# Patient Record
Sex: Female | Born: 1960 | Race: Black or African American | Hispanic: No | Marital: Single | Smoking: Never smoker
Health system: Southern US, Community
[De-identification: ages and names within clinical notes are randomized; demographics above are authoritative.]

## PROBLEM LIST (undated history)

## (undated) ENCOUNTER — Emergency Department (HOSPITAL_COMMUNITY): Admission: EM | Payer: Self-pay | Source: Home / Self Care

## (undated) DIAGNOSIS — M545 Low back pain, unspecified: Secondary | ICD-10-CM

## (undated) DIAGNOSIS — R519 Headache, unspecified: Secondary | ICD-10-CM

## (undated) DIAGNOSIS — G8929 Other chronic pain: Secondary | ICD-10-CM

## (undated) DIAGNOSIS — F1020 Alcohol dependence, uncomplicated: Secondary | ICD-10-CM

## (undated) DIAGNOSIS — I10 Essential (primary) hypertension: Secondary | ICD-10-CM

## (undated) DIAGNOSIS — K529 Noninfective gastroenteritis and colitis, unspecified: Secondary | ICD-10-CM

## (undated) DIAGNOSIS — K922 Gastrointestinal hemorrhage, unspecified: Secondary | ICD-10-CM

## (undated) DIAGNOSIS — N12 Tubulo-interstitial nephritis, not specified as acute or chronic: Secondary | ICD-10-CM

## (undated) DIAGNOSIS — R51 Headache: Secondary | ICD-10-CM

## (undated) DIAGNOSIS — F1910 Other psychoactive substance abuse, uncomplicated: Secondary | ICD-10-CM

## (undated) DIAGNOSIS — R569 Unspecified convulsions: Secondary | ICD-10-CM

## (undated) DIAGNOSIS — K859 Acute pancreatitis without necrosis or infection, unspecified: Secondary | ICD-10-CM

## (undated) HISTORY — PX: NO PAST SURGERIES: SHX2092

---

## 1997-08-02 ENCOUNTER — Inpatient Hospital Stay (HOSPITAL_COMMUNITY): Admission: EM | Admit: 1997-08-02 | Discharge: 1997-08-07 | Payer: Self-pay | Admitting: Emergency Medicine

## 1997-08-13 ENCOUNTER — Emergency Department (HOSPITAL_COMMUNITY): Admission: EM | Admit: 1997-08-13 | Discharge: 1997-08-13 | Payer: Self-pay | Admitting: Emergency Medicine

## 1997-08-15 ENCOUNTER — Emergency Department (HOSPITAL_COMMUNITY): Admission: EM | Admit: 1997-08-15 | Discharge: 1997-08-15 | Payer: Self-pay | Admitting: Emergency Medicine

## 1997-08-18 ENCOUNTER — Other Ambulatory Visit: Admission: RE | Admit: 1997-08-18 | Discharge: 1997-08-18 | Payer: Self-pay | Admitting: Family Medicine

## 1997-10-17 ENCOUNTER — Emergency Department (HOSPITAL_COMMUNITY): Admission: EM | Admit: 1997-10-17 | Discharge: 1997-10-17 | Payer: Self-pay | Admitting: Emergency Medicine

## 1997-10-22 ENCOUNTER — Emergency Department (HOSPITAL_COMMUNITY): Admission: EM | Admit: 1997-10-22 | Discharge: 1997-10-22 | Payer: Self-pay | Admitting: Emergency Medicine

## 1997-11-14 ENCOUNTER — Inpatient Hospital Stay (HOSPITAL_COMMUNITY): Admission: AD | Admit: 1997-11-14 | Discharge: 1997-11-14 | Payer: Self-pay | Admitting: *Deleted

## 1997-11-15 ENCOUNTER — Inpatient Hospital Stay (HOSPITAL_COMMUNITY): Admission: AD | Admit: 1997-11-15 | Discharge: 1997-11-15 | Payer: Self-pay | Admitting: *Deleted

## 1997-11-17 ENCOUNTER — Ambulatory Visit (HOSPITAL_COMMUNITY): Admission: RE | Admit: 1997-11-17 | Discharge: 1997-11-17 | Payer: Self-pay | Admitting: *Deleted

## 1997-11-20 ENCOUNTER — Emergency Department (HOSPITAL_COMMUNITY): Admission: EM | Admit: 1997-11-20 | Discharge: 1997-11-20 | Payer: Self-pay | Admitting: Emergency Medicine

## 1997-12-09 ENCOUNTER — Encounter: Payer: Self-pay | Admitting: Emergency Medicine

## 1997-12-09 ENCOUNTER — Emergency Department (HOSPITAL_COMMUNITY): Admission: EM | Admit: 1997-12-09 | Discharge: 1997-12-09 | Payer: Self-pay | Admitting: Emergency Medicine

## 1998-01-07 ENCOUNTER — Inpatient Hospital Stay (HOSPITAL_COMMUNITY): Admission: EM | Admit: 1998-01-07 | Discharge: 1998-01-10 | Payer: Self-pay | Admitting: Emergency Medicine

## 1998-01-07 ENCOUNTER — Encounter: Payer: Self-pay | Admitting: Emergency Medicine

## 1998-01-07 ENCOUNTER — Encounter: Payer: Self-pay | Admitting: Internal Medicine

## 1998-03-26 ENCOUNTER — Emergency Department (HOSPITAL_COMMUNITY): Admission: EM | Admit: 1998-03-26 | Discharge: 1998-03-26 | Payer: Self-pay | Admitting: Emergency Medicine

## 1998-03-27 ENCOUNTER — Encounter: Payer: Self-pay | Admitting: Emergency Medicine

## 1998-05-20 ENCOUNTER — Inpatient Hospital Stay (HOSPITAL_COMMUNITY): Admission: EM | Admit: 1998-05-20 | Discharge: 1998-05-22 | Payer: Self-pay | Admitting: Emergency Medicine

## 1998-05-20 ENCOUNTER — Encounter: Payer: Self-pay | Admitting: Emergency Medicine

## 1998-05-21 ENCOUNTER — Encounter: Payer: Self-pay | Admitting: General Surgery

## 1998-07-08 ENCOUNTER — Encounter: Payer: Self-pay | Admitting: Emergency Medicine

## 1998-07-08 ENCOUNTER — Emergency Department (HOSPITAL_COMMUNITY): Admission: EM | Admit: 1998-07-08 | Discharge: 1998-07-08 | Payer: Self-pay | Admitting: Emergency Medicine

## 1998-08-26 ENCOUNTER — Emergency Department (HOSPITAL_COMMUNITY): Admission: EM | Admit: 1998-08-26 | Discharge: 1998-08-26 | Payer: Self-pay | Admitting: Emergency Medicine

## 1998-10-23 ENCOUNTER — Other Ambulatory Visit: Admission: RE | Admit: 1998-10-23 | Discharge: 1998-10-23 | Payer: Self-pay | Admitting: Family Medicine

## 1998-11-09 ENCOUNTER — Emergency Department (HOSPITAL_COMMUNITY): Admission: EM | Admit: 1998-11-09 | Discharge: 1998-11-09 | Payer: Self-pay | Admitting: Emergency Medicine

## 1999-01-13 ENCOUNTER — Emergency Department (HOSPITAL_COMMUNITY): Admission: EM | Admit: 1999-01-13 | Discharge: 1999-01-13 | Payer: Self-pay | Admitting: Emergency Medicine

## 1999-01-13 ENCOUNTER — Encounter: Payer: Self-pay | Admitting: Emergency Medicine

## 1999-01-14 ENCOUNTER — Encounter: Payer: Self-pay | Admitting: Emergency Medicine

## 1999-05-29 ENCOUNTER — Encounter: Payer: Self-pay | Admitting: Emergency Medicine

## 1999-05-29 ENCOUNTER — Emergency Department (HOSPITAL_COMMUNITY): Admission: EM | Admit: 1999-05-29 | Discharge: 1999-05-29 | Payer: Self-pay | Admitting: Emergency Medicine

## 1999-07-17 ENCOUNTER — Emergency Department (HOSPITAL_COMMUNITY): Admission: EM | Admit: 1999-07-17 | Discharge: 1999-07-17 | Payer: Self-pay | Admitting: Emergency Medicine

## 1999-10-13 ENCOUNTER — Encounter: Payer: Self-pay | Admitting: Emergency Medicine

## 1999-10-13 ENCOUNTER — Emergency Department (HOSPITAL_COMMUNITY): Admission: EM | Admit: 1999-10-13 | Discharge: 1999-10-13 | Payer: Self-pay | Admitting: Emergency Medicine

## 1999-10-30 ENCOUNTER — Encounter: Payer: Self-pay | Admitting: Emergency Medicine

## 1999-10-30 ENCOUNTER — Emergency Department (HOSPITAL_COMMUNITY): Admission: EM | Admit: 1999-10-30 | Discharge: 1999-10-30 | Payer: Self-pay | Admitting: Emergency Medicine

## 1999-11-09 ENCOUNTER — Emergency Department (HOSPITAL_COMMUNITY): Admission: EM | Admit: 1999-11-09 | Discharge: 1999-11-09 | Payer: Self-pay | Admitting: Emergency Medicine

## 1999-11-09 ENCOUNTER — Encounter: Payer: Self-pay | Admitting: Emergency Medicine

## 1999-11-25 ENCOUNTER — Encounter: Payer: Self-pay | Admitting: Emergency Medicine

## 1999-11-25 ENCOUNTER — Emergency Department (HOSPITAL_COMMUNITY): Admission: EM | Admit: 1999-11-25 | Discharge: 1999-11-25 | Payer: Self-pay | Admitting: Emergency Medicine

## 2000-02-14 ENCOUNTER — Emergency Department (HOSPITAL_COMMUNITY): Admission: EM | Admit: 2000-02-14 | Discharge: 2000-02-15 | Payer: Self-pay

## 2000-02-24 ENCOUNTER — Emergency Department (HOSPITAL_COMMUNITY): Admission: EM | Admit: 2000-02-24 | Discharge: 2000-02-24 | Payer: Self-pay | Admitting: Emergency Medicine

## 2000-04-17 ENCOUNTER — Encounter: Payer: Self-pay | Admitting: Family Medicine

## 2000-04-17 ENCOUNTER — Ambulatory Visit (HOSPITAL_COMMUNITY): Admission: RE | Admit: 2000-04-17 | Discharge: 2000-04-17 | Payer: Self-pay | Admitting: Family Medicine

## 2001-04-30 ENCOUNTER — Emergency Department (HOSPITAL_COMMUNITY): Admission: EM | Admit: 2001-04-30 | Discharge: 2001-04-30 | Payer: Self-pay | Admitting: *Deleted

## 2001-09-20 ENCOUNTER — Emergency Department (HOSPITAL_COMMUNITY): Admission: EM | Admit: 2001-09-20 | Discharge: 2001-09-20 | Payer: Self-pay | Admitting: Emergency Medicine

## 2001-09-20 ENCOUNTER — Encounter: Payer: Self-pay | Admitting: Emergency Medicine

## 2001-11-12 ENCOUNTER — Emergency Department (HOSPITAL_COMMUNITY): Admission: EM | Admit: 2001-11-12 | Discharge: 2001-11-12 | Payer: Self-pay | Admitting: Emergency Medicine

## 2001-11-12 ENCOUNTER — Encounter: Payer: Self-pay | Admitting: Emergency Medicine

## 2002-01-25 ENCOUNTER — Encounter: Payer: Self-pay | Admitting: *Deleted

## 2002-01-25 ENCOUNTER — Inpatient Hospital Stay (HOSPITAL_COMMUNITY): Admission: EM | Admit: 2002-01-25 | Discharge: 2002-01-27 | Payer: Self-pay | Admitting: Emergency Medicine

## 2002-04-18 ENCOUNTER — Emergency Department (HOSPITAL_COMMUNITY): Admission: EM | Admit: 2002-04-18 | Discharge: 2002-04-18 | Payer: Self-pay | Admitting: Emergency Medicine

## 2002-04-20 ENCOUNTER — Emergency Department (HOSPITAL_COMMUNITY): Admission: EM | Admit: 2002-04-20 | Discharge: 2002-04-20 | Payer: Self-pay | Admitting: Emergency Medicine

## 2002-04-22 ENCOUNTER — Emergency Department (HOSPITAL_COMMUNITY): Admission: EM | Admit: 2002-04-22 | Discharge: 2002-04-22 | Payer: Self-pay | Admitting: Emergency Medicine

## 2002-04-28 ENCOUNTER — Emergency Department (HOSPITAL_COMMUNITY): Admission: EM | Admit: 2002-04-28 | Discharge: 2002-04-28 | Payer: Self-pay | Admitting: Emergency Medicine

## 2002-06-30 ENCOUNTER — Emergency Department (HOSPITAL_COMMUNITY): Admission: EM | Admit: 2002-06-30 | Discharge: 2002-07-01 | Payer: Self-pay

## 2002-10-01 ENCOUNTER — Emergency Department (HOSPITAL_COMMUNITY): Admission: EM | Admit: 2002-10-01 | Discharge: 2002-10-01 | Payer: Self-pay | Admitting: Emergency Medicine

## 2003-06-06 ENCOUNTER — Emergency Department (HOSPITAL_COMMUNITY): Admission: EM | Admit: 2003-06-06 | Discharge: 2003-06-06 | Payer: Self-pay

## 2003-08-14 ENCOUNTER — Emergency Department (HOSPITAL_COMMUNITY): Admission: EM | Admit: 2003-08-14 | Discharge: 2003-08-14 | Payer: Self-pay | Admitting: Emergency Medicine

## 2004-04-02 ENCOUNTER — Ambulatory Visit: Payer: Self-pay | Admitting: Internal Medicine

## 2004-10-27 ENCOUNTER — Emergency Department (HOSPITAL_COMMUNITY): Admission: EM | Admit: 2004-10-27 | Discharge: 2004-10-27 | Payer: Self-pay | Admitting: *Deleted

## 2005-07-22 ENCOUNTER — Emergency Department (HOSPITAL_COMMUNITY): Admission: EM | Admit: 2005-07-22 | Discharge: 2005-07-22 | Payer: Self-pay | Admitting: Emergency Medicine

## 2005-08-10 ENCOUNTER — Emergency Department (HOSPITAL_COMMUNITY): Admission: EM | Admit: 2005-08-10 | Discharge: 2005-08-10 | Payer: Self-pay | Admitting: Emergency Medicine

## 2005-10-03 ENCOUNTER — Ambulatory Visit: Payer: Self-pay | Admitting: Family Medicine

## 2005-10-11 ENCOUNTER — Ambulatory Visit (HOSPITAL_COMMUNITY): Admission: RE | Admit: 2005-10-11 | Discharge: 2005-10-11 | Payer: Self-pay | Admitting: Family Medicine

## 2005-10-13 ENCOUNTER — Emergency Department (HOSPITAL_COMMUNITY): Admission: EM | Admit: 2005-10-13 | Discharge: 2005-10-13 | Payer: Self-pay | Admitting: Emergency Medicine

## 2006-07-06 ENCOUNTER — Emergency Department (HOSPITAL_COMMUNITY): Admission: EM | Admit: 2006-07-06 | Discharge: 2006-07-07 | Payer: Self-pay | Admitting: Emergency Medicine

## 2006-08-02 ENCOUNTER — Emergency Department (HOSPITAL_COMMUNITY): Admission: EM | Admit: 2006-08-02 | Discharge: 2006-08-02 | Payer: Self-pay | Admitting: Emergency Medicine

## 2006-11-03 ENCOUNTER — Ambulatory Visit: Payer: Self-pay | Admitting: Internal Medicine

## 2006-11-03 LAB — CONVERTED CEMR LAB
Basophils Absolute: 0 10*3/uL (ref 0.0–0.1)
Basophils Relative: 1 % (ref 0–1)
Eosinophils Relative: 1 % (ref 0–5)
Hemoglobin: 13.2 g/dL (ref 12.0–15.0)
MCHC: 34 g/dL (ref 30.0–36.0)
Monocytes Absolute: 0.5 10*3/uL (ref 0.2–0.7)
Neutro Abs: 2.7 10*3/uL (ref 1.7–7.7)
Platelets: 86 10*3/uL — ABNORMAL LOW (ref 150–400)
RDW: 15.2 % — ABNORMAL HIGH (ref 11.5–14.0)

## 2006-11-04 ENCOUNTER — Emergency Department (HOSPITAL_COMMUNITY): Admission: EM | Admit: 2006-11-04 | Discharge: 2006-11-04 | Payer: Self-pay | Admitting: Emergency Medicine

## 2006-12-02 ENCOUNTER — Ambulatory Visit: Payer: Self-pay | Admitting: Internal Medicine

## 2007-02-08 ENCOUNTER — Emergency Department (HOSPITAL_COMMUNITY): Admission: EM | Admit: 2007-02-08 | Discharge: 2007-02-08 | Payer: Self-pay | Admitting: Emergency Medicine

## 2007-05-04 ENCOUNTER — Ambulatory Visit: Payer: Self-pay | Admitting: Internal Medicine

## 2007-06-19 ENCOUNTER — Emergency Department (HOSPITAL_COMMUNITY): Admission: EM | Admit: 2007-06-19 | Discharge: 2007-06-19 | Payer: Self-pay | Admitting: Emergency Medicine

## 2007-08-17 ENCOUNTER — Emergency Department (HOSPITAL_COMMUNITY): Admission: EM | Admit: 2007-08-17 | Discharge: 2007-08-17 | Payer: Self-pay | Admitting: Family Medicine

## 2007-10-13 ENCOUNTER — Encounter: Payer: Self-pay | Admitting: Family Medicine

## 2007-10-13 LAB — CONVERTED CEMR LAB
ALT: 13 units/L (ref 0–35)
Basophils Absolute: 0 10*3/uL (ref 0.0–0.1)
CO2: 22 meq/L (ref 19–32)
Calcium: 9.1 mg/dL (ref 8.4–10.5)
Chloride: 106 meq/L (ref 96–112)
Cholesterol: 143 mg/dL (ref 0–200)
Hemoglobin: 13.3 g/dL (ref 12.0–15.0)
Lymphocytes Relative: 44 % (ref 12–46)
Neutro Abs: 2.9 10*3/uL (ref 1.7–7.7)
Platelets: 249 10*3/uL (ref 150–400)
RDW: 14.6 % (ref 11.5–15.5)
Sodium: 140 meq/L (ref 135–145)
Total Protein: 6.9 g/dL (ref 6.0–8.3)
VLDL: 18 mg/dL (ref 0–40)

## 2007-11-18 ENCOUNTER — Emergency Department (HOSPITAL_COMMUNITY): Admission: EM | Admit: 2007-11-18 | Discharge: 2007-11-18 | Payer: Self-pay | Admitting: Emergency Medicine

## 2008-03-29 ENCOUNTER — Ambulatory Visit: Payer: Self-pay | Admitting: Internal Medicine

## 2008-03-29 ENCOUNTER — Encounter: Payer: Self-pay | Admitting: Internal Medicine

## 2008-03-29 ENCOUNTER — Other Ambulatory Visit: Admission: RE | Admit: 2008-03-29 | Discharge: 2008-03-29 | Payer: Self-pay | Admitting: Internal Medicine

## 2008-03-30 ENCOUNTER — Encounter (INDEPENDENT_AMBULATORY_CARE_PROVIDER_SITE_OTHER): Payer: Self-pay | Admitting: Family Medicine

## 2008-03-30 LAB — CONVERTED CEMR LAB: FSH: 21.4 milliintl units/mL

## 2008-07-09 ENCOUNTER — Emergency Department (HOSPITAL_COMMUNITY): Admission: EM | Admit: 2008-07-09 | Discharge: 2008-07-09 | Payer: Self-pay | Admitting: Emergency Medicine

## 2008-11-14 ENCOUNTER — Ambulatory Visit: Payer: Self-pay | Admitting: Family Medicine

## 2008-11-14 ENCOUNTER — Encounter (INDEPENDENT_AMBULATORY_CARE_PROVIDER_SITE_OTHER): Payer: Self-pay | Admitting: Family Medicine

## 2008-11-14 LAB — CONVERTED CEMR LAB
ALT: 23 units/L (ref 0–35)
AST: 26 units/L (ref 0–37)
Albumin: 4.5 g/dL (ref 3.5–5.2)
BUN: 10 mg/dL (ref 6–23)
Barbiturate Quant, Ur: NEGATIVE
Calcium: 9.2 mg/dL (ref 8.4–10.5)
Chloride: 101 meq/L (ref 96–112)
Creatinine,U: 87.5 mg/dL
Eosinophils Relative: 1 % (ref 0–5)
HCT: 38.8 % (ref 36.0–46.0)
Hemoglobin: 12.9 g/dL (ref 12.0–15.0)
Lymphocytes Relative: 27 % (ref 12–46)
Lymphs Abs: 1.1 10*3/uL (ref 0.7–4.0)
Methadone: NEGATIVE
Opiate Screen, Urine: NEGATIVE
Phencyclidine (PCP): NEGATIVE
Platelets: 161 10*3/uL (ref 150–400)
Potassium: 3.9 meq/L (ref 3.5–5.3)
Propoxyphene: NEGATIVE
Total Protein: 7.3 g/dL (ref 6.0–8.3)
WBC: 4.2 10*3/uL (ref 4.0–10.5)

## 2009-02-04 DIAGNOSIS — F1910 Other psychoactive substance abuse, uncomplicated: Secondary | ICD-10-CM

## 2009-02-04 DIAGNOSIS — F1020 Alcohol dependence, uncomplicated: Secondary | ICD-10-CM

## 2009-02-04 HISTORY — DX: Alcohol dependence, uncomplicated: F10.20

## 2009-02-04 HISTORY — DX: Other psychoactive substance abuse, uncomplicated: F19.10

## 2009-03-30 ENCOUNTER — Emergency Department (HOSPITAL_COMMUNITY): Admission: EM | Admit: 2009-03-30 | Discharge: 2009-03-30 | Payer: Self-pay | Admitting: Emergency Medicine

## 2009-04-27 ENCOUNTER — Emergency Department (HOSPITAL_COMMUNITY): Admission: EM | Admit: 2009-04-27 | Discharge: 2009-04-27 | Payer: Self-pay | Admitting: Emergency Medicine

## 2009-05-31 ENCOUNTER — Encounter (INDEPENDENT_AMBULATORY_CARE_PROVIDER_SITE_OTHER): Payer: Self-pay | Admitting: Adult Health

## 2009-05-31 ENCOUNTER — Ambulatory Visit: Payer: Self-pay | Admitting: Family Medicine

## 2009-05-31 LAB — CONVERTED CEMR LAB
Barbiturate Quant, Ur: NEGATIVE
Creatinine,U: 189 mg/dL
Methadone: NEGATIVE
Propoxyphene: NEGATIVE

## 2009-06-01 ENCOUNTER — Encounter (INDEPENDENT_AMBULATORY_CARE_PROVIDER_SITE_OTHER): Payer: Self-pay | Admitting: Adult Health

## 2009-08-16 ENCOUNTER — Emergency Department (HOSPITAL_COMMUNITY): Admission: EM | Admit: 2009-08-16 | Discharge: 2009-08-16 | Payer: Self-pay | Admitting: Emergency Medicine

## 2009-11-30 ENCOUNTER — Inpatient Hospital Stay (HOSPITAL_COMMUNITY): Admission: EM | Admit: 2009-11-30 | Discharge: 2009-12-01 | Payer: Self-pay | Admitting: Emergency Medicine

## 2010-02-25 ENCOUNTER — Encounter: Payer: Self-pay | Admitting: Internal Medicine

## 2010-03-28 ENCOUNTER — Emergency Department (HOSPITAL_COMMUNITY)
Admission: EM | Admit: 2010-03-28 | Discharge: 2010-03-28 | Disposition: A | Discharge status: Home / Self Care | Payer: Medicaid Other | Attending: Emergency Medicine | Admitting: Emergency Medicine

## 2010-03-28 DIAGNOSIS — M545 Low back pain, unspecified: Secondary | ICD-10-CM | POA: Insufficient documentation

## 2010-03-28 DIAGNOSIS — M25519 Pain in unspecified shoulder: Secondary | ICD-10-CM | POA: Insufficient documentation

## 2010-03-28 DIAGNOSIS — G40909 Epilepsy, unspecified, not intractable, without status epilepticus: Secondary | ICD-10-CM | POA: Insufficient documentation

## 2010-04-18 LAB — APTT: aPTT: 29 seconds (ref 24–37)

## 2010-04-18 LAB — GLUCOSE, CAPILLARY
Glucose-Capillary: 119 mg/dL — ABNORMAL HIGH (ref 70–99)
Glucose-Capillary: 72 mg/dL (ref 70–99)
Glucose-Capillary: 76 mg/dL (ref 70–99)
Glucose-Capillary: 91 mg/dL (ref 70–99)

## 2010-04-18 LAB — BASIC METABOLIC PANEL
BUN: 10 mg/dL (ref 6–23)
CO2: 21 mEq/L (ref 19–32)
CO2: 28 mEq/L (ref 19–32)
Calcium: 8 mg/dL — ABNORMAL LOW (ref 8.4–10.5)
Chloride: 104 mEq/L (ref 96–112)
Chloride: 113 mEq/L — ABNORMAL HIGH (ref 96–112)
Creatinine, Ser: 0.61 mg/dL (ref 0.4–1.2)
Creatinine, Ser: 0.71 mg/dL (ref 0.4–1.2)
GFR calc Af Amer: >60 mL/min (ref >60)

## 2010-04-18 LAB — TSH: TSH: 0.717 u[IU]/mL (ref 0.350–4.500)

## 2010-04-18 LAB — URINALYSIS, ROUTINE W REFLEX MICROSCOPIC
Glucose, UA: NEGATIVE mg/dL
Nitrite: NEGATIVE
Protein, ur: NEGATIVE mg/dL
pH: 5.5 (ref 5.0–8.0)

## 2010-04-18 LAB — CBC
Hemoglobin: 11.9 g/dL — ABNORMAL LOW (ref 12.0–15.0)
MCH: 32.5 pg (ref 26.0–34.0)
MCH: 32.5 pg (ref 26.0–34.0)
MCH: 33.2 pg (ref 26.0–34.0)
MCHC: 34.3 g/dL (ref 30.0–36.0)
MCHC: 35.1 g/dL (ref 30.0–36.0)
MCV: 94.6 fL (ref 78.0–100.0)
MCV: 94.8 fL (ref 78.0–100.0)
MCV: 95 fL (ref 78.0–100.0)
Platelets: 177 10*3/uL (ref 150–400)
Platelets: 185 10*3/uL (ref 150–400)
RBC: 2.93 MIL/uL — ABNORMAL LOW (ref 3.87–5.11)
RDW: 13.9 % (ref 11.5–15.5)

## 2010-04-18 LAB — POCT I-STAT, CHEM 8
Calcium, Ion: 1.04 mmol/L — ABNORMAL LOW (ref 1.12–1.32)
Creatinine, Ser: 1.1 mg/dL (ref 0.4–1.2)
Glucose, Bld: 88 mg/dL (ref 70–99)
HCT: 41 % (ref 36.0–46.0)
Hemoglobin: 13.9 g/dL (ref 12.0–15.0)
Potassium: 3.9 mEq/L (ref 3.5–5.1)
TCO2: 27 mmol/L (ref 0–100)

## 2010-04-18 LAB — TYPE AND SCREEN
ABO/RH(D): B POS
Antibody Screen: NEGATIVE

## 2010-04-18 LAB — COMPREHENSIVE METABOLIC PANEL
ALT: 13 U/L (ref 0–35)
CO2: 25 mEq/L (ref 19–32)
Calcium: 8.7 mg/dL (ref 8.4–10.5)
Creatinine, Ser: 0.64 mg/dL (ref 0.4–1.2)
GFR calc non Af Amer: >60 mL/min (ref >60)
Glucose, Bld: 91 mg/dL (ref 70–99)
Sodium: 137 mEq/L (ref 135–145)
Total Protein: 7.7 g/dL (ref 6.0–8.3)

## 2010-04-18 LAB — URINE MICROSCOPIC-ADD ON

## 2010-04-18 LAB — DIFFERENTIAL
Basophils Absolute: 0.1 10*3/uL (ref 0.0–0.1)
Eosinophils Relative: 1 % (ref 0–5)
Lymphocytes Relative: 52 % — ABNORMAL HIGH (ref 12–46)
Lymphs Abs: 2.5 10*3/uL (ref 0.7–4.0)
Neutro Abs: 2 10*3/uL (ref 1.7–7.7)
Neutrophils Relative %: 41 % — ABNORMAL LOW (ref 43–77)

## 2010-04-18 LAB — HEPATIC FUNCTION PANEL
Bilirubin, Direct: 0.1 mg/dL (ref 0.0–0.3)
Indirect Bilirubin: 0.4 mg/dL (ref 0.3–0.9)
Total Bilirubin: 0.5 mg/dL (ref 0.3–1.2)

## 2010-04-18 LAB — URINE CULTURE: Colony Count: >=100000

## 2010-04-18 LAB — LIPID PANEL
Cholesterol: 196 mg/dL (ref 0–200)
Total CHOL/HDL Ratio: 2.6 RATIO

## 2010-04-18 LAB — PROTIME-INR
INR: 0.91 (ref 0.00–1.49)
Prothrombin Time: 12.5 seconds (ref 11.6–15.2)

## 2010-04-18 LAB — PREGNANCY, URINE: Preg Test, Ur: NEGATIVE

## 2010-04-18 LAB — RAPID URINE DRUG SCREEN, HOSP PERFORMED: Opiates: NOT DETECTED

## 2010-04-22 LAB — URINE CULTURE

## 2010-04-22 LAB — DIFFERENTIAL
Basophils Absolute: 0 10*3/uL (ref 0.0–0.1)
Lymphocytes Relative: 8 % — ABNORMAL LOW (ref 12–46)
Neutro Abs: 6.8 10*3/uL (ref 1.7–7.7)
Neutrophils Relative %: 82 % — ABNORMAL HIGH (ref 43–77)

## 2010-04-22 LAB — RAPID URINE DRUG SCREEN, HOSP PERFORMED
Amphetamines: NOT DETECTED
Barbiturates: NOT DETECTED
Benzodiazepines: NOT DETECTED

## 2010-04-22 LAB — URINALYSIS, ROUTINE W REFLEX MICROSCOPIC
Ketones, ur: NEGATIVE mg/dL
Nitrite: POSITIVE — AB
Specific Gravity, Urine: 1.016 (ref 1.005–1.030)
Urobilinogen, UA: 2 mg/dL — ABNORMAL HIGH (ref 0.0–1.0)
pH: 6.5 (ref 5.0–8.0)

## 2010-04-22 LAB — PHENYTOIN LEVEL, TOTAL: Phenytoin Lvl: <2.5 ug/mL — ABNORMAL LOW (ref 10.0–20.0)

## 2010-04-22 LAB — URINE MICROSCOPIC-ADD ON

## 2010-04-22 LAB — CBC
Platelets: 148 10*3/uL — ABNORMAL LOW (ref 150–400)
RDW: 14.4 % (ref 11.5–15.5)
WBC: 8.3 10*3/uL (ref 4.0–10.5)

## 2010-04-22 LAB — POCT I-STAT, CHEM 8
Chloride: 99 mEq/L (ref 96–112)
HCT: 44 % (ref 36.0–46.0)
Potassium: 3.5 mEq/L (ref 3.5–5.1)

## 2010-04-22 LAB — ETHANOL: Alcohol, Ethyl (B): <5 mg/dL (ref 0–10)

## 2010-04-27 LAB — POCT I-STAT, CHEM 8
BUN: 3 mg/dL — ABNORMAL LOW (ref 6–23)
HCT: 37 % (ref 36.0–46.0)
Hemoglobin: 12.6 g/dL (ref 12.0–15.0)
Sodium: 144 mEq/L (ref 135–145)
TCO2: 26 mmol/L (ref 0–100)

## 2010-04-27 LAB — ETHANOL: Alcohol, Ethyl (B): 67 mg/dL — ABNORMAL HIGH (ref 0–10)

## 2010-05-14 LAB — COMPREHENSIVE METABOLIC PANEL
ALT: 21 U/L (ref 0–35)
AST: 41 U/L — ABNORMAL HIGH (ref 0–37)
Albumin: 4 g/dL (ref 3.5–5.2)
CO2: 19 mEq/L (ref 19–32)
Calcium: 8.4 mg/dL (ref 8.4–10.5)
Chloride: 106 mEq/L (ref 96–112)
GFR calc Af Amer: >60 mL/min (ref >60)
GFR calc non Af Amer: >60 mL/min (ref >60)
Sodium: 138 mEq/L (ref 135–145)

## 2010-05-14 LAB — URINALYSIS, ROUTINE W REFLEX MICROSCOPIC
Leukocytes, UA: NEGATIVE
Nitrite: NEGATIVE
Protein, ur: NEGATIVE mg/dL
Urobilinogen, UA: 0.2 mg/dL (ref 0.0–1.0)

## 2010-05-14 LAB — CBC
MCHC: 34.8 g/dL (ref 30.0–36.0)
Platelets: 149 10*3/uL — ABNORMAL LOW (ref 150–400)
RBC: 3.78 MIL/uL — ABNORMAL LOW (ref 3.87–5.11)
WBC: 6.3 10*3/uL (ref 4.0–10.5)

## 2010-05-14 LAB — DIFFERENTIAL
Eosinophils Absolute: 0.1 10*3/uL (ref 0.0–0.7)
Eosinophils Relative: 1 % (ref 0–5)
Lymphocytes Relative: 22 % (ref 12–46)
Lymphs Abs: 1.4 10*3/uL (ref 0.7–4.0)
Monocytes Absolute: 0.4 10*3/uL (ref 0.1–1.0)

## 2010-05-14 LAB — RAPID URINE DRUG SCREEN, HOSP PERFORMED
Amphetamines: NOT DETECTED
Benzodiazepines: NOT DETECTED
Tetrahydrocannabinol: NOT DETECTED

## 2010-05-14 LAB — PHENYTOIN LEVEL, TOTAL: Phenytoin Lvl: <2.5 ug/mL — ABNORMAL LOW (ref 10.0–20.0)

## 2010-05-14 LAB — URINE MICROSCOPIC-ADD ON

## 2010-06-22 NOTE — Discharge Summary (Signed)
   NAMESACHA, TOPOR                        ACCOUNT NO.:  [id number]   MEDICAL RECORD NO.:  [id number]                   PATIENT TYPE:  INP   LOCATION:  5006                                 FACILITY:  [facility]   PHYSICIAN:  Merlene Laughter. Renae Gloss, M.D.           DATE OF BIRTH:  05-Jun-1960   DATE OF ADMISSION:  01/25/2002  DATE OF DISCHARGE:  01/27/2002                                 DISCHARGE SUMMARY   DISCHARGE DIAGNOSES:  1. Pneumonia.  2. Polysubstance abuse.   DISCHARGE MEDICATIONS:  1. Tessalon Perles 200 mg p.o. t.i.d.  2. Ventolin inhaler two puffs q.i.d.  3. Tequin 400 mg one p.o. q.d. for 7 days following discharge.   HOSPITAL COURSE:  The patient was admitted with fever, white cell count of  25 and a right lower lobe infiltrate.  These symptoms were suggestive of  pneumonia.  She was admitted for IV antibiotic treatment and observation.  She did well with IV medications.  She had no signs of withdrawal from  polysubstance abuse during this admission.   DISPOSITION FOLLOWING DISCHARGE:  The patient was at her baseline status,  white cell count was 10.8.  She was tolerating oral intake and ambulating  without complications.   FOLLOW UP:  The patient will arrange a followup appointment with [facility]  within 2 weeks following discharge.                                               Merlene Laughter. Renae Gloss, M.D.    KRS/MEDQ  D:  01/27/2002  T:  01/28/2002  Job:  [id number]

## 2010-06-22 NOTE — H&P (Signed)
   NAMEVADIE, Brandy Bishop                        ACCOUNT NO.:  [id number]   MEDICAL RECORD NO.:  [id number]                   PATIENT TYPE:  INP   LOCATION:  5006                                 FACILITY:  [facility]   PHYSICIAN:  Merlene Laughter. Renae Gloss, M.D.           DATE OF BIRTH:  1960/03/31   DATE OF ADMISSION:  01/25/2002  DATE OF DISCHARGE:                                HISTORY & PHYSICAL   CHIEF COMPLAINT:  Cough, shortness of breath.   HISTORY OF PRESENT ILLNESS:  The patient is a 50 year old lady who presents  today to the emergency room with a several day history of progressively  worsening coughing with shortness of breath. A chest x-ray revealed a right  lower lobe infiltrate. She has no other acute constitutional or systemic  complaints other than productive cough with green sputum, intermittent fever  and chills.   ALLERGIES:  PENICILLIN CAUSES SWELLING.   PAST MEDICAL HISTORY:  1. 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  2. Significant for alcohol and cocaine abuse.   FAMILY HISTORY:  Negative for hypertension, diabetes or cancer known to the  patient.   REVIEW OF SYSTEMS:  As per HPI and the patient's history. Greater than 10  systems are reviewed.   PHYSICAL EXAMINATION:  GENERAL:  A well developed, well nourished black  female in no acute distress.  VITAL SIGNS:  Blood pressure 137/86, temperature 100.6, pulse 86,  respirations 20, O2 saturations on room air 98%.  HEENT:  Tympanic membranes within normal limits. No oropharyngeal lesions.  NECK:  Supple, no masses, 2+ carotids, no bruits.  LUNGS:  Decreased breath sounds at the bases bilaterally.  HEART:  S1, S2, regular rate and rhythm, no murmurs, rubs, gallops.  ABDOMEN:  Soft, nontender, nondistended, positive bowel sounds.  EXTREMITIES:  No cyanosis, clubbing or edema.  NEUROLOGIC:  Alert and oriented x 3. Cranial nerves intact.   ASSESSMENT AND PLAN:  Pneumonia with shortness of breath. Albuterol  nebulizers will be started as well as IV antibiotics. She  will be observed  for 24 hours and discharged on oral therapy.                                                 Merlene Laughter. Renae Gloss, M.D.    KRS/MEDQ  D:  01/25/2002  T:  01/25/2002  Job:  [id number]

## 2010-08-19 ENCOUNTER — Emergency Department (HOSPITAL_COMMUNITY): Payer: Medicaid Other

## 2010-08-19 ENCOUNTER — Emergency Department (HOSPITAL_COMMUNITY)
Admission: EM | Admit: 2010-08-19 | Discharge: 2010-08-20 | Disposition: A | Discharge status: Home / Self Care | Payer: Medicaid Other | Attending: Emergency Medicine | Admitting: Emergency Medicine

## 2010-08-19 DIAGNOSIS — K2970 Gastritis, unspecified, without bleeding: Secondary | ICD-10-CM | POA: Insufficient documentation

## 2010-08-19 DIAGNOSIS — E876 Hypokalemia: Secondary | ICD-10-CM | POA: Insufficient documentation

## 2010-08-19 DIAGNOSIS — R4182 Altered mental status, unspecified: Secondary | ICD-10-CM | POA: Insufficient documentation

## 2010-08-19 DIAGNOSIS — R569 Unspecified convulsions: Secondary | ICD-10-CM | POA: Insufficient documentation

## 2010-08-19 DIAGNOSIS — I10 Essential (primary) hypertension: Secondary | ICD-10-CM | POA: Insufficient documentation

## 2010-08-19 DIAGNOSIS — F1010 Alcohol abuse, uncomplicated: Secondary | ICD-10-CM | POA: Insufficient documentation

## 2010-08-19 LAB — COMPREHENSIVE METABOLIC PANEL
Albumin: 3.7 g/dL (ref 3.5–5.2)
Alkaline Phosphatase: 73 U/L (ref 39–117)
BUN: 8 mg/dL (ref 6–23)
Chloride: 102 mEq/L (ref 96–112)
Creatinine, Ser: 0.47 mg/dL — ABNORMAL LOW (ref 0.50–1.10)
GFR calc Af Amer: >60 mL/min (ref >60)
GFR calc non Af Amer: >60 mL/min (ref >60)
Glucose, Bld: 137 mg/dL — ABNORMAL HIGH (ref 70–99)
Total Bilirubin: 0.2 mg/dL — ABNORMAL LOW (ref 0.3–1.2)

## 2010-08-19 LAB — URINALYSIS, ROUTINE W REFLEX MICROSCOPIC
Bilirubin Urine: NEGATIVE
Glucose, UA: NEGATIVE mg/dL
Ketones, ur: NEGATIVE mg/dL
Nitrite: NEGATIVE
Specific Gravity, Urine: 1.009 (ref 1.005–1.030)
pH: 5 (ref 5.0–8.0)

## 2010-08-19 LAB — RAPID URINE DRUG SCREEN, HOSP PERFORMED
Barbiturates: NOT DETECTED
Benzodiazepines: NOT DETECTED

## 2010-08-19 LAB — CBC
HCT: 31 % — ABNORMAL LOW (ref 36.0–46.0)
Hemoglobin: 10.6 g/dL — ABNORMAL LOW (ref 12.0–15.0)
MCH: 33.1 pg (ref 26.0–34.0)
MCV: 96.9 fL (ref 78.0–100.0)
Platelets: 121 10*3/uL — ABNORMAL LOW (ref 150–400)
RBC: 3.2 MIL/uL — ABNORMAL LOW (ref 3.87–5.11)
WBC: 3.9 10*3/uL — ABNORMAL LOW (ref 4.0–10.5)

## 2010-08-19 LAB — URINE MICROSCOPIC-ADD ON

## 2010-08-19 LAB — AMMONIA: Ammonia: 65 umol/L — ABNORMAL HIGH (ref 11–60)

## 2010-08-19 LAB — DIFFERENTIAL
Eosinophils Absolute: 0.1 10*3/uL (ref 0.0–0.7)
Lymphocytes Relative: 48 % — ABNORMAL HIGH (ref 12–46)
Lymphs Abs: 1.9 10*3/uL (ref 0.7–4.0)
Monocytes Relative: 9 % (ref 3–12)
Neutro Abs: 1.5 10*3/uL — ABNORMAL LOW (ref 1.7–7.7)
Neutrophils Relative %: 39 % — ABNORMAL LOW (ref 43–77)

## 2010-08-19 LAB — SALICYLATE LEVEL: Salicylate Lvl: <2 mg/dL — ABNORMAL LOW (ref 2.8–20.0)

## 2010-08-19 LAB — POCT PREGNANCY, URINE: Preg Test, Ur: NEGATIVE

## 2010-08-19 LAB — ETHANOL: Alcohol, Ethyl (B): 357 mg/dL — ABNORMAL HIGH (ref 0–11)

## 2010-08-19 LAB — ACETAMINOPHEN LEVEL: Acetaminophen (Tylenol), Serum: <15 ug/mL (ref 10–30)

## 2010-10-24 LAB — DIFFERENTIAL
Eosinophils Absolute: 0.1
Eosinophils Relative: 2
Lymphocytes Relative: 34
Lymphs Abs: 1.4
Monocytes Relative: 7

## 2010-10-24 LAB — BASIC METABOLIC PANEL
Chloride: 98
GFR calc Af Amer: >60
GFR calc non Af Amer: >60
Potassium: 4.1
Sodium: 134 — ABNORMAL LOW

## 2010-10-24 LAB — CBC
HCT: 35.8 — ABNORMAL LOW
Hemoglobin: 12.1
MCV: 93.6
Platelets: 161
RBC: 3.83 — ABNORMAL LOW
WBC: 4.1

## 2010-10-24 LAB — POCT CARDIAC MARKERS
Operator id: 257131
Troponin i, poc: <0.05

## 2010-11-01 LAB — GC/CHLAMYDIA PROBE AMP, GENITAL
Chlamydia, DNA Probe: NEGATIVE
GC Probe Amp, Genital: NEGATIVE

## 2010-11-01 LAB — WET PREP, GENITAL
Clue Cells Wet Prep HPF POC: NONE SEEN
Trich, Wet Prep: NONE SEEN

## 2010-11-05 LAB — LIPASE, BLOOD: Lipase: 13

## 2010-11-05 LAB — URINALYSIS, ROUTINE W REFLEX MICROSCOPIC
Bilirubin Urine: NEGATIVE
Ketones, ur: NEGATIVE
Nitrite: NEGATIVE
Protein, ur: NEGATIVE
Urobilinogen, UA: 0.2
pH: 5.5

## 2010-11-05 LAB — DIFFERENTIAL
Lymphocytes Relative: 31
Lymphs Abs: 1.2
Monocytes Relative: 7
Neutro Abs: 2.3
Neutrophils Relative %: 59

## 2010-11-05 LAB — POCT CARDIAC MARKERS
CKMB, poc: <1 — ABNORMAL LOW
Myoglobin, poc: 28.2
Troponin i, poc: <0.05

## 2010-11-05 LAB — COMPREHENSIVE METABOLIC PANEL
CO2: 26
Calcium: 9.8
Creatinine, Ser: 0.64
GFR calc non Af Amer: >60
Glucose, Bld: 89
Total Protein: 7.6

## 2010-11-05 LAB — CBC
Hemoglobin: 13.2
MCHC: 33.6
MCV: 97.4
RBC: 4.04
RDW: 14.5

## 2010-11-05 LAB — URINE CULTURE: Colony Count: >=100000

## 2010-11-05 LAB — URINE MICROSCOPIC-ADD ON

## 2010-11-05 LAB — PHENYTOIN LEVEL, TOTAL: Phenytoin Lvl: <2.5 — ABNORMAL LOW

## 2010-11-15 LAB — CBC
HCT: 32 — ABNORMAL LOW
Platelets: 83 — ABNORMAL LOW
RDW: 14.8 — ABNORMAL HIGH

## 2011-03-15 ENCOUNTER — Encounter (HOSPITAL_COMMUNITY): Payer: Self-pay | Admitting: *Deleted

## 2011-03-15 ENCOUNTER — Emergency Department (HOSPITAL_COMMUNITY)
Admission: EM | Admit: 2011-03-15 | Discharge: 2011-03-15 | Disposition: A | Discharge status: Home / Self Care | Payer: Medicaid Other | Attending: Emergency Medicine | Admitting: Emergency Medicine

## 2011-03-15 ENCOUNTER — Emergency Department (HOSPITAL_COMMUNITY): Payer: Medicaid Other

## 2011-03-15 DIAGNOSIS — M25519 Pain in unspecified shoulder: Secondary | ICD-10-CM | POA: Insufficient documentation

## 2011-03-15 DIAGNOSIS — IMO0001 Reserved for inherently not codable concepts without codable children: Secondary | ICD-10-CM | POA: Insufficient documentation

## 2011-03-15 DIAGNOSIS — M25511 Pain in right shoulder: Secondary | ICD-10-CM

## 2011-03-15 DIAGNOSIS — J40 Bronchitis, not specified as acute or chronic: Secondary | ICD-10-CM | POA: Insufficient documentation

## 2011-03-15 DIAGNOSIS — G40909 Epilepsy, unspecified, not intractable, without status epilepticus: Secondary | ICD-10-CM | POA: Insufficient documentation

## 2011-03-15 DIAGNOSIS — R05 Cough: Secondary | ICD-10-CM | POA: Insufficient documentation

## 2011-03-15 DIAGNOSIS — R059 Cough, unspecified: Secondary | ICD-10-CM | POA: Insufficient documentation

## 2011-03-15 DIAGNOSIS — R079 Chest pain, unspecified: Secondary | ICD-10-CM | POA: Insufficient documentation

## 2011-03-15 DIAGNOSIS — R0602 Shortness of breath: Secondary | ICD-10-CM | POA: Insufficient documentation

## 2011-03-15 DIAGNOSIS — Z79899 Other long term (current) drug therapy: Secondary | ICD-10-CM | POA: Insufficient documentation

## 2011-03-15 DIAGNOSIS — J45909 Unspecified asthma, uncomplicated: Secondary | ICD-10-CM | POA: Insufficient documentation

## 2011-03-15 HISTORY — DX: Unspecified convulsions: R56.9

## 2011-03-15 MED ORDER — AZITHROMYCIN 250 MG PO TABS: 500.0000 mg | ORAL_TABLET | Freq: Once | ORAL | Status: AC

## 2011-03-15 MED ORDER — IBUPROFEN 800 MG PO TABS
800.0000 mg | ORAL_TABLET | Freq: Once | ORAL | Status: AC
  Administered 2011-03-15: 800 mg via ORAL
  Filled 2011-03-15: qty 1

## 2011-03-15 MED ORDER — IBUPROFEN 800 MG PO TABS: 800.0000 mg | ORAL_TABLET | Freq: Once | ORAL | Status: AC

## 2011-03-15 MED ORDER — ALBUTEROL SULFATE HFA 108 (90 BASE) MCG/ACT IN AERS
2.0000 | INHALATION_SPRAY | Freq: Once | RESPIRATORY_TRACT | Status: AC
  Administered 2011-03-15: 2 via RESPIRATORY_TRACT
  Filled 2011-03-15: qty 6.7

## 2011-03-15 NOTE — ED Notes (Signed)
Pt in c/o cough for unknown amount of time, ETOH, pt state she thinks she has the flu

## 2011-03-15 NOTE — ED Provider Notes (Signed)
Medical screening examination/treatment/procedure(s) were performed by non-physician practitioner and as supervising physician I was immediately available for consultation/collaboration.   Forbes Cellar, MD 03/15/11 [phone]
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
## 2011-11-13 ENCOUNTER — Emergency Department (HOSPITAL_COMMUNITY): Payer: Medicaid Other

## 2011-11-13 ENCOUNTER — Encounter (HOSPITAL_COMMUNITY): Payer: Self-pay | Admitting: *Deleted

## 2011-11-13 ENCOUNTER — Emergency Department (HOSPITAL_COMMUNITY)
Admission: EM | Admit: 2011-11-13 | Discharge: 2011-11-13 | Disposition: A | Discharge status: Home / Self Care | Payer: Medicaid Other | Attending: Emergency Medicine | Admitting: Emergency Medicine

## 2011-11-13 DIAGNOSIS — R070 Pain in throat: Secondary | ICD-10-CM | POA: Insufficient documentation

## 2011-11-13 DIAGNOSIS — R51 Headache: Secondary | ICD-10-CM | POA: Insufficient documentation

## 2011-11-13 DIAGNOSIS — I10 Essential (primary) hypertension: Secondary | ICD-10-CM | POA: Insufficient documentation

## 2011-11-13 DIAGNOSIS — J069 Acute upper respiratory infection, unspecified: Secondary | ICD-10-CM | POA: Insufficient documentation

## 2011-11-13 DIAGNOSIS — M545 Low back pain, unspecified: Secondary | ICD-10-CM | POA: Insufficient documentation

## 2011-11-13 DIAGNOSIS — R220 Localized swelling, mass and lump, head: Secondary | ICD-10-CM | POA: Insufficient documentation

## 2011-11-13 DIAGNOSIS — R079 Chest pain, unspecified: Secondary | ICD-10-CM | POA: Insufficient documentation

## 2011-11-13 DIAGNOSIS — G8929 Other chronic pain: Secondary | ICD-10-CM | POA: Insufficient documentation

## 2011-11-13 DIAGNOSIS — R0982 Postnasal drip: Secondary | ICD-10-CM | POA: Insufficient documentation

## 2011-11-13 DIAGNOSIS — J45909 Unspecified asthma, uncomplicated: Secondary | ICD-10-CM | POA: Insufficient documentation

## 2011-11-13 DIAGNOSIS — Z79899 Other long term (current) drug therapy: Secondary | ICD-10-CM | POA: Insufficient documentation

## 2011-11-13 DIAGNOSIS — R221 Localized swelling, mass and lump, neck: Secondary | ICD-10-CM | POA: Insufficient documentation

## 2011-11-13 DIAGNOSIS — R0602 Shortness of breath: Secondary | ICD-10-CM | POA: Insufficient documentation

## 2011-11-13 HISTORY — DX: Essential (primary) hypertension: I10

## 2011-11-13 LAB — CBC WITH DIFFERENTIAL/PLATELET
Basophils Absolute: 0 10*3/uL (ref 0.0–0.1)
HCT: 33.5 % — ABNORMAL LOW (ref 36.0–46.0)
Lymphocytes Relative: 48 % — ABNORMAL HIGH (ref 12–46)
Lymphs Abs: 2.9 10*3/uL (ref 0.7–4.0)
Neutro Abs: 2.6 10*3/uL (ref 1.7–7.7)
Platelets: 173 10*3/uL (ref 150–400)
RBC: 3.53 MIL/uL — ABNORMAL LOW (ref 3.87–5.11)
RDW: 13.4 % (ref 11.5–15.5)
WBC: 6.1 10*3/uL (ref 4.0–10.5)

## 2011-11-13 LAB — COMPREHENSIVE METABOLIC PANEL
ALT: 10 U/L (ref 0–35)
AST: 16 U/L (ref 0–37)
Alkaline Phosphatase: 79 U/L (ref 39–117)
CO2: 25 mEq/L (ref 19–32)
Chloride: 102 mEq/L (ref 96–112)
GFR calc non Af Amer: >90 mL/min (ref >90)
Glucose, Bld: 85 mg/dL (ref 70–99)
Sodium: 138 mEq/L (ref 135–145)
Total Bilirubin: 0.2 mg/dL — ABNORMAL LOW (ref 0.3–1.2)

## 2011-11-13 LAB — TROPONIN I: Troponin I: <0.3 ng/mL (ref <0.30)

## 2011-11-13 MED ORDER — IPRATROPIUM BROMIDE 0.03 % NA SOLN: 2.0000 | Freq: Two times a day (BID) | NASAL | Status: DC

## 2011-11-13 MED ORDER — ASPIRIN 325 MG PO TABS
325.0000 mg | ORAL_TABLET | Freq: Once | ORAL | Status: AC
  Administered 2011-11-13: 325 mg via ORAL
  Filled 2011-11-13: qty 1

## 2011-11-13 MED ORDER — IBUPROFEN 800 MG PO TABS
800.0000 mg | ORAL_TABLET | Freq: Once | ORAL | Status: AC
  Administered 2011-11-13: 800 mg via ORAL
  Filled 2011-11-13: qty 1

## 2011-11-13 MED ORDER — BENZONATATE 100 MG PO CAPS: 100.0000 mg | ORAL_CAPSULE | Freq: Three times a day (TID) | ORAL | Status: DC | PRN

## 2011-11-13 NOTE — ED Provider Notes (Signed)
Medical screening examination/treatment/procedure(s) were performed by non-physician practitioner and as supervising physician I was immediately available for consultation/collaboration.   Tennie Grussing H Bernadette Gores, MD 11/13/11 0623 

## 2011-11-13 NOTE — ED Notes (Signed)
Pt c/o chest pain rating 7/10; headache; sore throat; toothache x 2 days; lower back pain x 2 days;

## 2011-11-13 NOTE — ED Provider Notes (Signed)
History     CSN: [id number]  Arrival date & time 11/13/11  0150   First MD Initiated Contact with Patient 11/13/11 0310      Chief Complaint  Patient presents with  . Chest Pain  . Back Pain    (Consider location/radiation/quality/duration/timing/severity/associated sxs/prior treatment) The history is provided by the patient and medical records.    1 - back pain: lower back pain, gradually started, persistent gradually worsening in the low back.  Described as sharp, rated at a 10/10 and non-radiating.  Pt denies saddle anesthesia, bowel.bladder dysfunction, or loss of leg function. Nothing makes it better and nothing makes it worse.  Patient states the pain is chronic. She denies any new injury, trauma.  2 - chest pain: began this evening.  Gradual onset, persistent and stabilized.  Described as throbbing, rated at a 10/10, non radiating.  Pt without Hx of MI, DVT, PE.  Pt with Hx of asthma.  She has associated SOB, coughing.  Denies N/V/D, diaphoresis, weakness, syncope.  Coughing makes her pain worse, nothing makes it better.  Alkaseltzer and ASA without relief.  3 - Sore throat: Pt with 2 days of sore throat, gradual onset, persistent and gradually worsening.  Described and sore, rated at a 10/10.  Pt with associated headache, sinus congestion, post nasal drip, rhinorrhea.  Pt denies fever, chills, neck pain, neck stiffness, abdominal pain.  Swallowing and talking make it worse, nothing makes it better.  Past Medical History  Diagnosis Date  . Seizures   . Asthma   . Hypertension     History reviewed. No pertinent past surgical history.  No family history on file.  History  Substance Use Topics  . Smoking status: Never Smoker   . Smokeless tobacco: Not on file  . Alcohol Use: Yes    OB History    Grav Para Term Preterm Abortions TAB SAB Ect Mult Living                  Review of Systems  Constitutional: Negative for fever, diaphoresis, appetite change, fatigue and  unexpected weight change.  HENT: Positive for congestion, sore throat, rhinorrhea, postnasal drip and sinus pressure. Negative for mouth sores, neck pain and neck stiffness.   Eyes: Negative for visual disturbance.  Respiratory: Positive for cough, chest tightness, shortness of breath and wheezing.   Cardiovascular: Positive for chest pain.  Gastrointestinal: Negative for nausea, vomiting, abdominal pain, diarrhea and constipation.  Genitourinary: Negative for dysuria, urgency, frequency and hematuria.  Musculoskeletal: Positive for back pain. Negative for joint swelling and gait problem.  Skin: Negative for rash.  Neurological: Positive for headaches. Negative for syncope and light-headedness.  Psychiatric/Behavioral: Negative for disturbed wake/sleep cycle. The patient is not nervous/anxious.   All other systems reviewed and are negative.    Allergies  Penicillins  Home Medications   Current Outpatient Rx  Name Route Sig Dispense Refill  . ALBUTEROL SULFATE HFA 108 (90 BASE) MCG/ACT IN AERS Inhalation Inhale 2 puffs into the lungs every 6 (six) hours as needed. For breathing    . IBUPROFEN 200 MG PO TABS Oral Take 600 mg by mouth every 6 (six) hours as needed. Pain    . PHENYTOIN SODIUM EXTENDED 100 MG PO CAPS Oral Take 300 mg by mouth every evening.     Marland Kitchen BENZONATATE 100 MG PO CAPS Oral Take 1 capsule (100 mg total) by mouth 3 (three) times daily as needed for cough. 20 capsule 0  . IPRATROPIUM BROMIDE 0.03 %  NA SOLN Nasal Place 2 sprays into the nose 2 (two) times daily. PRN congestion 30 mL 0    BP 123/84  Pulse 74  Temp 98.1 F (36.7 C) (Oral)  Resp 17  SpO2 97%  Physical Exam  Nursing note and vitals reviewed. Constitutional: She is oriented to person, place, and time. She appears well-developed and well-nourished. No distress.  HENT:  Head: Normocephalic and atraumatic.  Right Ear: Tympanic membrane, external ear and ear canal normal.  Left Ear: Tympanic membrane,  external ear and ear canal normal.  Nose: Mucosal edema present. Right sinus exhibits no maxillary sinus tenderness and no frontal sinus tenderness. Left sinus exhibits no maxillary sinus tenderness and no frontal sinus tenderness.  Mouth/Throat: Uvula is midline and mucous membranes are normal. Posterior oropharyngeal erythema present. No oropharyngeal exudate, posterior oropharyngeal edema or tonsillar abscesses.  Eyes: Conjunctivae normal and EOM are normal. Pupils are equal, round, and reactive to light. No scleral icterus.  Neck: Normal range of motion. Neck supple.  Cardiovascular: Normal rate, regular rhythm, normal heart sounds and intact distal pulses.  Exam reveals no gallop and no friction rub.   No murmur heard. Pulmonary/Chest: Effort normal. No respiratory distress. She has wheezes (very mild expiratory wheeze at the bases bilaterally). She exhibits no tenderness.  Abdominal: Soft. Bowel sounds are normal. She exhibits no mass. There is no tenderness. There is no rebound and no guarding.  Musculoskeletal: Normal range of motion. She exhibits no edema and no tenderness.  Lymphadenopathy:    She has no cervical adenopathy.  Neurological: She is alert and oriented to person, place, and time. She exhibits normal muscle tone. Coordination normal.       Speech is clear and goal oriented Moves extremities without ataxia  Skin: Skin is warm and dry. No rash noted. She is not diaphoretic. No erythema.  Psychiatric: She has a normal mood and affect.    ED Course  Procedures (including critical care time)  Labs Reviewed  CBC WITH DIFFERENTIAL - Abnormal; Notable for the following:    RBC 3.53 (*)     Hemoglobin 11.5 (*)     HCT 33.5 (*)     Neutrophils Relative 42 (*)     Lymphocytes Relative 48 (*)     All other components within normal limits  COMPREHENSIVE METABOLIC PANEL - Abnormal; Notable for the following:    Total Bilirubin 0.2 (*)     All other components within normal  limits  TROPONIN I  RAPID STREP SCREEN  TROPONIN I   Dg Chest 2 View  11/13/2011  *RADIOLOGY REPORT*  Clinical Data: 51 year old female with chest pain.  CHEST - 2 VIEW  Comparison: 03/15/2011 and earlier.  Findings: Lung volumes are within normal limits.  Cardiac size and mediastinal contours are within normal limits.  Visualized tracheal air column is within normal limits.  Incidental EKG button artifact.  Lung parenchyma is stable and clear.  No pneumothorax or effusion. No acute osseous abnormality identified.  IMPRESSION: No acute cardiopulmonary abnormality.   Original Report Authenticated By: Harley Hallmark, M.D.    ECG:  Date: 11/13/2011  Rate: 71  Rhythm: normal sinus rhythm  QRS Axis: normal  Intervals: normal  ST/T Wave abnormalities: normal  Conduction Disutrbances:none  Narrative Interpretation: NSR, non-ischemic  Old EKG Reviewed: none available     1. Viral URI   2. Back pain, chronic       MDM  [name] presents with multiple complaints.  Evaluate for  ACS.  Back pain is chronic and she is without new injury.   CBC CMP unremarkable.  Chest x-ray without evidence of edema or pneumonia.  Troponin negative.  Nonischemic ECG.  Rapid strep negative.  Repeat troponin negative.  Patient with viral URI symptoms and chronic back pain.  No evidence of infection.  Patient nontoxic, nonseptic appearing, vitals stable, patient afebrile.  No indication for antibiotics at this time.  On re-evaluation pt is sleeping comfortably with significant reduction in back pain and resolution of chest pain.  Chest pain is not likely of cardiac or pulmonary etiology d/t presentation, perc negative, VSS, no tracheal deviation, no JVD or new murmur, RRR, breath sounds equal bilaterally, EKG without acute abnormalities, negative troponin, and negative CXR. Pt has been advised to return to the ED if CP becomes exertional, associated with diaphoresis or nausea, radiates to left jaw/arm, worsens  or becomes concerning in any way.   Case has been discussed with by Dr. Chaney Malling who agrees with the above plan to discharge.    1. Medications: Atrovent nasal spray, Tessalon Perles 2. Treatment: Rest, drink plenty of fluids, take medications as prescribed, take over-the-counter Mucinex, tylenol/motrin for pain 3. Follow Up: With primary care physician or in the emergency department if symptoms worsen.         Dahlia Client Edynn Gillock, PA-C 11/13/11 [phone]

## 2011-11-13 NOTE — ED Notes (Signed)
PA at bedside.

## 2012-03-24 ENCOUNTER — Emergency Department (HOSPITAL_COMMUNITY): Payer: Medicaid Other

## 2012-03-24 ENCOUNTER — Encounter (HOSPITAL_COMMUNITY): Payer: Self-pay | Admitting: Neurology

## 2012-03-24 ENCOUNTER — Emergency Department (HOSPITAL_COMMUNITY)
Admission: EM | Admit: 2012-03-24 | Discharge: 2012-03-24 | Disposition: A | Discharge status: Home / Self Care | Payer: Medicaid Other | Attending: Emergency Medicine | Admitting: Emergency Medicine

## 2012-03-24 DIAGNOSIS — I10 Essential (primary) hypertension: Secondary | ICD-10-CM

## 2012-03-24 DIAGNOSIS — M549 Dorsalgia, unspecified: Secondary | ICD-10-CM | POA: Insufficient documentation

## 2012-03-24 DIAGNOSIS — Z79899 Other long term (current) drug therapy: Secondary | ICD-10-CM | POA: Insufficient documentation

## 2012-03-24 DIAGNOSIS — R079 Chest pain, unspecified: Secondary | ICD-10-CM | POA: Insufficient documentation

## 2012-03-24 DIAGNOSIS — J45909 Unspecified asthma, uncomplicated: Secondary | ICD-10-CM | POA: Insufficient documentation

## 2012-03-24 DIAGNOSIS — G40909 Epilepsy, unspecified, not intractable, without status epilepticus: Secondary | ICD-10-CM

## 2012-03-24 DIAGNOSIS — R51 Headache: Secondary | ICD-10-CM

## 2012-03-24 LAB — CBC WITH DIFFERENTIAL/PLATELET
Basophils Relative: 1 % (ref 0–1)
Eosinophils Relative: 3 % (ref 0–5)
HCT: 39.9 % (ref 36.0–46.0)
Hemoglobin: 13.7 g/dL (ref 12.0–15.0)
MCH: 32.2 pg (ref 26.0–34.0)
MCHC: 34.3 g/dL (ref 30.0–36.0)
MCV: 93.7 fL (ref 78.0–100.0)
Monocytes Absolute: 0.4 10*3/uL (ref 0.1–1.0)
Monocytes Relative: 9 % (ref 3–12)
Neutro Abs: 1.9 10*3/uL (ref 1.7–7.7)

## 2012-03-24 LAB — URINE MICROSCOPIC-ADD ON

## 2012-03-24 LAB — URINALYSIS, ROUTINE W REFLEX MICROSCOPIC
Glucose, UA: NEGATIVE mg/dL
Protein, ur: NEGATIVE mg/dL
Urobilinogen, UA: 1 mg/dL (ref 0.0–1.0)

## 2012-03-24 LAB — COMPREHENSIVE METABOLIC PANEL
Albumin: 4.4 g/dL (ref 3.5–5.2)
BUN: 12 mg/dL (ref 6–23)
Calcium: 10 mg/dL (ref 8.4–10.5)
Chloride: 98 mEq/L (ref 96–112)
Creatinine, Ser: 0.66 mg/dL (ref 0.50–1.10)
GFR calc non Af Amer: >90 mL/min (ref >90)
Total Bilirubin: 0.7 mg/dL (ref 0.3–1.2)

## 2012-03-24 LAB — POCT I-STAT TROPONIN I: Troponin i, poc: 0.01 ng/mL (ref 0.00–0.08)

## 2012-03-24 MED ORDER — DIPHENHYDRAMINE HCL 50 MG/ML IJ SOLN
25.0000 mg | Freq: Once | INTRAMUSCULAR | Status: AC
  Administered 2012-03-24: 25 mg via INTRAVENOUS
  Filled 2012-03-24: qty 1

## 2012-03-24 MED ORDER — HYDROCHLOROTHIAZIDE 25 MG PO TABS: 25.0000 mg | ORAL_TABLET | Freq: Every day | ORAL | Status: DC

## 2012-03-24 MED ORDER — METOCLOPRAMIDE HCL 5 MG/ML IJ SOLN
10.0000 mg | Freq: Once | INTRAMUSCULAR | Status: AC
  Administered 2012-03-24: 10 mg via INTRAVENOUS
  Filled 2012-03-24: qty 2

## 2012-03-24 MED ORDER — DEXAMETHASONE SODIUM PHOSPHATE 10 MG/ML IJ SOLN
10.0000 mg | Freq: Once | INTRAMUSCULAR | Status: AC
  Administered 2012-03-24: 10 mg via INTRAVENOUS
  Filled 2012-03-24: qty 1

## 2012-03-24 MED ORDER — PHENYTOIN SODIUM EXTENDED 100 MG PO CAPS: ORAL_CAPSULE | ORAL | Status: DC

## 2012-03-24 MED ORDER — SODIUM CHLORIDE 0.9 % IV SOLN
Freq: Once | INTRAVENOUS | Status: AC
  Administered 2012-03-24: 11:00:00 via INTRAVENOUS

## 2012-03-24 NOTE — ED Notes (Signed)
Left message for social worker/ case management for medication assistance.

## 2012-03-24 NOTE — ED Notes (Signed)
PATIENT SENT TO [facility] TO WAIT FOR SOCIAL WORKER TO COME AND TRY TO OFFER ASSISTANCE IN GETTING HER MEDICATIONS FILLED.  HAVE SPOKEN TO PJ AND SHE IS AWARE OF PT

## 2012-03-24 NOTE — ED Notes (Signed)
Pt states cp and back pain x several weeks, woke up this morning and the pain worse. Central cp, throbbing, radiates to left breast. Reports feeling sob and nauseated. States headache for a long time because hasn't been taking high blood pressure medication. Pt alert and oriented. Skin warm and dry.

## 2012-03-24 NOTE — ED Notes (Signed)
Patient transported to X-ray 

## 2012-03-24 NOTE — ED Provider Notes (Signed)
History     CSN: [id number]  Arrival date & time 03/24/12  0845   First MD Initiated Contact with Patient 03/24/12 [phone]      Chief Complaint  Patient presents with  . Chest Pain  . Back Pain  . Headache    (Consider location/radiation/quality/duration/timing/severity/associated sxs/prior treatment) HPI Comments: Patient is a 52 year old woman who complains of chest pain for 2 weeks, worse today. She feels the pain in the left lateral chest with radiation to the back. She also has headache, which she attributes to not taking her blood pressure pills. She has run out of all of her medications, for high blood pressure, for asthma, and for seizure disorder.  Patient is a 52 y.o. female presenting with chest pain. The history is provided by the patient and medical records. No language interpreter was used.  Chest Pain Pain location:  L chest Pain quality: aching   Pain radiates to:  Upper back Pain radiates to the back: yes   Pain severity:  Moderate Onset quality:  Gradual Duration:  2 weeks Timing:  Intermittent Progression:  Worsening Chronicity:  New Context comment:  Has run out of her medications. Relieved by:  Nothing Worsened by:  Nothing tried Associated symptoms: back pain and headache   Associated symptoms: no fever   Headaches:    Severity:  Severe   Onset quality:  Gradual   Duration:  2 weeks   Timing:  Intermittent   Chronicity:  Recurrent Risk factors: hypertension     Past Medical History  Diagnosis Date  . Seizures   . Asthma   . Hypertension     History reviewed. No pertinent past surgical history.  No family history on file.  History  Substance Use Topics  . Smoking status: Never Smoker   . Smokeless tobacco: Not on file  . Alcohol Use: Yes    OB History   Grav Para Term Preterm Abortions TAB SAB Ect Mult Living                  Review of Systems  Constitutional: Negative for fever and chills.  HENT: Negative for ear pain and sore  throat.   Eyes: Negative.   Respiratory: Negative.   Cardiovascular: Positive for chest pain.  Gastrointestinal: Negative.   Genitourinary: Negative.   Musculoskeletal: Positive for back pain.  Skin: Negative.   Neurological: Positive for headaches.       History of seizure disorder.  Psychiatric/Behavioral: Negative.     Allergies  Penicillins  Home Medications   Current Outpatient Rx  Name  Route  Sig  Dispense  Refill  . albuterol (PROVENTIL HFA;VENTOLIN HFA) 108 (90 BASE) MCG/ACT inhaler   Inhalation   Inhale 2 puffs into the lungs every 6 (six) hours as needed. For breathing         . benzonatate (TESSALON PERLES) 100 MG capsule   Oral   Take 1 capsule (100 mg total) by mouth 3 (three) times daily as needed for cough.   20 capsule   0   . ibuprofen (ADVIL,MOTRIN) 200 MG tablet   Oral   Take 600 mg by mouth every 6 (six) hours as needed. Pain         . ipratropium (ATROVENT) 0.03 % nasal spray   Nasal   Place 2 sprays into the nose 2 (two) times daily. PRN congestion   30 mL   0   . phenytoin (DILANTIN) 100 MG ER capsule   Oral  Take 300 mg by mouth every evening.            BP 149/108  Pulse 80  Temp(Src) 99 F (37.2 C) (Oral)  Resp 20  SpO2 99%  Physical Exam  Nursing note and vitals reviewed. Constitutional: She is oriented to person, place, and time.  BP 149/108.  Middleaged woman complaining of chest pain, headache.  HENT:  Head: Normocephalic and atraumatic.  Right Ear: External ear normal.  Left Ear: External ear normal.  Mouth/Throat: Oropharynx is clear and moist.  Eyes: Conjunctivae and EOM are normal. Pupils are equal, round, and reactive to light.  Neck: Normal range of motion. Neck supple.  Cardiovascular: Normal rate, regular rhythm and normal heart sounds.   Pulmonary/Chest: Effort normal and breath sounds normal.  Abdominal: Soft. Bowel sounds are normal.  Musculoskeletal: Normal range of motion. She exhibits no edema and  no tenderness.  Neurological: She is alert and oriented to person, place, and time.  No sensory or motor deficit.  Skin: Skin is warm and dry.  Psychiatric: She has a normal mood and affect. Her behavior is normal.    ED Course  Procedures (including critical care time)  9:11 AM  Date: 03/24/2012  Rate:71  Rhythm: normal sinus rhythm  QRS Axis: normal  Intervals: normal  ST/T Wave abnormalities: normal  Conduction Disutrbances:none  Narrative Interpretation: Normal EKG  Old EKG Reviewed: unchanged   9:53 AM Pt was seen and had physical examination.  EKG is normal.  Lab workup ordered.  IV fluids, IV medications for headache ordered.d  12:07 PM Results for orders placed during the hospital encounter of 03/24/12  CBC WITH DIFFERENTIAL      Result Value Range   WBC 4.4  4.0 - 10.5 K/uL   RBC 4.26  3.87 - 5.11 MIL/uL   Hemoglobin 13.7  12.0 - 15.0 g/dL   HCT 69.6  29.5 - 28.4 %   MCV 93.7  78.0 - 100.0 fL   MCH 32.2  26.0 - 34.0 pg   MCHC 34.3  30.0 - 36.0 g/dL   RDW 13.2  44.0 - 10.2 %   Platelets 242  150 - 400 K/uL   Neutrophils Relative 43  43 - 77 %   Neutro Abs 1.9  1.7 - 7.7 K/uL   Lymphocytes Relative 45  12 - 46 %   Lymphs Abs 2.0  0.7 - 4.0 K/uL   Monocytes Relative 9  3 - 12 %   Monocytes Absolute 0.4  0.1 - 1.0 K/uL   Eosinophils Relative 3  0 - 5 %   Eosinophils Absolute 0.1  0.0 - 0.7 K/uL   Basophils Relative 1  0 - 1 %   Basophils Absolute 0.0  0.0 - 0.1 K/uL  COMPREHENSIVE METABOLIC PANEL      Result Value Range   Sodium 139  135 - 145 mEq/L   Potassium 3.9  3.5 - 5.1 mEq/L   Chloride 98  96 - 112 mEq/L   CO2 29  19 - 32 mEq/L   Glucose, Bld 93  70 - 99 mg/dL   BUN 12  6 - 23 mg/dL   Creatinine, Ser 7.25  0.50 - 1.10 mg/dL   Calcium 36.6  8.4 - 44.0 mg/dL   Total Protein 8.3  6.0 - 8.3 g/dL   Albumin 4.4  3.5 - 5.2 g/dL   AST 21  0 - 37 U/L   ALT 11  0 - 35 U/L   Alkaline  Phosphatase 75  39 - 117 U/L   Total Bilirubin 0.7  0.3 - 1.2 mg/dL    GFR calc non Af Amer >90  >90 mL/min   GFR calc Af Amer >90  >90 mL/min  POCT I-STAT TROPONIN I      Result Value Range   Troponin i, poc 0.01  0.00 - 0.08 ng/mL   Comment 3            Dg Chest 2 View  03/24/2012  *RADIOLOGY REPORT*  Clinical Data: Chest pain; hypertension  CHEST - 2 VIEW  Comparison: November 13, 2011  Findings: Lungs clear.  Heart size and pulmonary vascularity are normal.  No adenopathy. No pneumothorax. There is a small bone island in the proximal right humerus, stable.  There is arthropathy in the right shoulder.  IMPRESSION: No edema or consolidation.   Original Report Authenticated By: Bretta Bang, M.D.    12:10 PM Pt's headache is resolved and BP is down.  She needs medications for hypertension and seizure disorder.  Will request social worker to see if she can get the meds for her.   1. Headache   2. Hypertension   3. Seizure disorder             Carleene Cooper III, MD 03/24/12 1215

## 2012-04-27 ENCOUNTER — Ambulatory Visit: Payer: Self-pay | Admitting: Neurology

## 2012-05-05 DIAGNOSIS — N12 Tubulo-interstitial nephritis, not specified as acute or chronic: Secondary | ICD-10-CM

## 2012-05-05 HISTORY — DX: Tubulo-interstitial nephritis, not specified as acute or chronic: N12

## 2012-05-06 ENCOUNTER — Inpatient Hospital Stay (HOSPITAL_COMMUNITY)
Admission: EM | Admit: 2012-05-06 | Discharge: 2012-05-11 | DRG: 872 | Disposition: A | Discharge status: Home / Self Care | Payer: Medicaid Other | Attending: Internal Medicine | Admitting: Internal Medicine

## 2012-05-06 ENCOUNTER — Emergency Department (HOSPITAL_COMMUNITY): Payer: Medicaid Other

## 2012-05-06 ENCOUNTER — Encounter (HOSPITAL_COMMUNITY): Payer: Self-pay | Admitting: Emergency Medicine

## 2012-05-06 ENCOUNTER — Ambulatory Visit: Payer: Self-pay | Admitting: Neurology

## 2012-05-06 DIAGNOSIS — J45909 Unspecified asthma, uncomplicated: Secondary | ICD-10-CM | POA: Diagnosis present

## 2012-05-06 DIAGNOSIS — G40909 Epilepsy, unspecified, not intractable, without status epilepticus: Secondary | ICD-10-CM | POA: Diagnosis present

## 2012-05-06 DIAGNOSIS — A419 Sepsis, unspecified organism: Secondary | ICD-10-CM | POA: Diagnosis present

## 2012-05-06 DIAGNOSIS — E876 Hypokalemia: Secondary | ICD-10-CM | POA: Diagnosis present

## 2012-05-06 DIAGNOSIS — N10 Acute tubulo-interstitial nephritis: Secondary | ICD-10-CM | POA: Diagnosis present

## 2012-05-06 DIAGNOSIS — Z91199 Patient's noncompliance with other medical treatment and regimen due to unspecified reason: Secondary | ICD-10-CM

## 2012-05-06 DIAGNOSIS — D72829 Elevated white blood cell count, unspecified: Secondary | ICD-10-CM | POA: Diagnosis present

## 2012-05-06 DIAGNOSIS — R197 Diarrhea, unspecified: Secondary | ICD-10-CM | POA: Diagnosis not present

## 2012-05-06 DIAGNOSIS — N12 Tubulo-interstitial nephritis, not specified as acute or chronic: Secondary | ICD-10-CM

## 2012-05-06 DIAGNOSIS — I10 Essential (primary) hypertension: Secondary | ICD-10-CM

## 2012-05-06 DIAGNOSIS — J069 Acute upper respiratory infection, unspecified: Secondary | ICD-10-CM | POA: Diagnosis present

## 2012-05-06 DIAGNOSIS — Z79899 Other long term (current) drug therapy: Secondary | ICD-10-CM

## 2012-05-06 DIAGNOSIS — K861 Other chronic pancreatitis: Secondary | ICD-10-CM | POA: Diagnosis present

## 2012-05-06 DIAGNOSIS — Z9119 Patient's noncompliance with other medical treatment and regimen: Secondary | ICD-10-CM

## 2012-05-06 DIAGNOSIS — A4151 Sepsis due to Escherichia coli [E. coli]: Principal | ICD-10-CM | POA: Diagnosis present

## 2012-05-06 LAB — URINE MICROSCOPIC-ADD ON

## 2012-05-06 LAB — CBC WITH DIFFERENTIAL/PLATELET
Basophils Relative: 0 % (ref 0–1)
Eosinophils Relative: 0 % (ref 0–5)
Hemoglobin: 10.9 g/dL — ABNORMAL LOW (ref 12.0–15.0)
Lymphocytes Relative: 6 % — ABNORMAL LOW (ref 12–46)
Neutrophils Relative %: 87 % — ABNORMAL HIGH (ref 43–77)
RBC: 3.35 MIL/uL — ABNORMAL LOW (ref 3.87–5.11)
WBC: 29.6 10*3/uL — ABNORMAL HIGH (ref 4.0–10.5)

## 2012-05-06 LAB — LACTIC ACID, PLASMA: Lactic Acid, Venous: 2.1 mmol/L (ref 0.5–2.2)

## 2012-05-06 LAB — URINALYSIS, ROUTINE W REFLEX MICROSCOPIC
Glucose, UA: NEGATIVE mg/dL
Protein, ur: 100 mg/dL — AB
pH: 6 (ref 5.0–8.0)

## 2012-05-06 LAB — COMPREHENSIVE METABOLIC PANEL
AST: 18 U/L (ref 0–37)
Albumin: 3.2 g/dL — ABNORMAL LOW (ref 3.5–5.2)
Alkaline Phosphatase: 119 U/L — ABNORMAL HIGH (ref 39–117)
Chloride: 89 mEq/L — ABNORMAL LOW (ref 96–112)
Creatinine, Ser: 0.83 mg/dL (ref 0.50–1.10)
Potassium: 3.9 mEq/L (ref 3.5–5.1)
Total Bilirubin: 0.4 mg/dL (ref 0.3–1.2)
Total Protein: 8 g/dL (ref 6.0–8.3)

## 2012-05-06 LAB — PROCALCITONIN: Procalcitonin: 0.62 ng/mL

## 2012-05-06 LAB — CG4 I-STAT (LACTIC ACID): Lactic Acid, Venous: 0.71 mmol/L (ref 0.5–2.2)

## 2012-05-06 MED ORDER — SODIUM CHLORIDE 0.9 % IV BOLUS (SEPSIS)
1000.0000 mL | Freq: Once | INTRAVENOUS | Status: AC
  Administered 2012-05-06: 1000 mL via INTRAVENOUS

## 2012-05-06 MED ORDER — LEVOFLOXACIN IN D5W 750 MG/150ML IV SOLN
750.0000 mg | Freq: Once | INTRAVENOUS | Status: AC
  Administered 2012-05-06: 750 mg via INTRAVENOUS
  Filled 2012-05-06: qty 150

## 2012-05-06 MED ORDER — ACETAMINOPHEN 325 MG PO TABS
650.0000 mg | ORAL_TABLET | Freq: Once | ORAL | Status: AC
  Administered 2012-05-06: 650 mg via ORAL
  Filled 2012-05-06: qty 2

## 2012-05-06 MED ORDER — DEXTROSE 5 % IV SOLN
2.0000 g | Freq: Once | INTRAVENOUS | Status: AC
  Administered 2012-05-06: 2 g via INTRAVENOUS
  Filled 2012-05-06: qty 2

## 2012-05-06 MED ORDER — LEVOFLOXACIN IN D5W 750 MG/150ML IV SOLN
750.0000 mg | INTRAVENOUS | Status: DC
  Administered 2012-05-07 – 2012-05-08 (×2): 750 mg via INTRAVENOUS
  Filled 2012-05-06 (×3): qty 150

## 2012-05-06 MED ORDER — IOHEXOL 300 MG/ML  SOLN
25.0000 mL | INTRAMUSCULAR | Status: AC
  Administered 2012-05-06: 25 mL via ORAL

## 2012-05-06 MED ORDER — IOHEXOL 300 MG/ML  SOLN
100.0000 mL | Freq: Once | INTRAMUSCULAR | Status: AC | PRN
  Administered 2012-05-06: 100 mL via INTRAVENOUS

## 2012-05-06 MED ORDER — DEXTROSE 5 % IV SOLN
2.0000 g | Freq: Three times a day (TID) | INTRAVENOUS | Status: DC
  Administered 2012-05-07 – 2012-05-09 (×8): 2 g via INTRAVENOUS
  Filled 2012-05-06 (×10): qty 2

## 2012-05-06 MED ORDER — VANCOMYCIN HCL IN DEXTROSE 1-5 GM/200ML-% IV SOLN
1000.0000 mg | Freq: Once | INTRAVENOUS | Status: AC
  Administered 2012-05-06: 1000 mg via INTRAVENOUS
  Filled 2012-05-06: qty 200

## 2012-05-06 MED ORDER — VANCOMYCIN HCL IN DEXTROSE 750-5 MG/150ML-% IV SOLN
750.0000 mg | Freq: Two times a day (BID) | INTRAVENOUS | Status: DC
  Administered 2012-05-07 (×2): 750 mg via INTRAVENOUS
  Filled 2012-05-06 (×4): qty 150

## 2012-05-06 NOTE — Progress Notes (Signed)
ANTIBIOTIC CONSULT NOTE - INITIAL  Pharmacy Consult for vancomycin, levaquin, aztreonam Indication: sepsis  Allergies  Allergen Reactions  . Penicillins Swelling    Patient Measurements: weight 129 lbs (~59 kg), height 63 inches --- per patient   Vital Signs: Temp: 103 F (39.4 C) (04/02 1804) Temp src: Oral (04/02 1804) BP: 126/73 mmHg (04/02 1804) Pulse Rate: 62 (04/02 1804) Intake/Output from previous day:   Intake/Output from this shift:    Labs: No results found for this basename: WBC, HGB, PLT, LABCREA, CREATININE,  in the last 72 hours CrCl is unknown because there is no height on file for the current visit. No results found for this basename: VANCOTROUGH, VANCOPEAK, VANCORANDOM, GENTTROUGH, GENTPEAK, GENTRANDOM, TOBRATROUGH, TOBRAPEAK, TOBRARND, AMIKACINPEAK, AMIKACINTROU, AMIKACIN,  in the last 72 hours   Microbiology: No results found for this or any previous visit (from the past 720 hour(s)).  Medical History: Past Medical History  Diagnosis Date  . Seizures   . Asthma   . Hypertension     Medications:  See med rec  Assessment: Patient is a 52 y.o F presented to the ED with c/o cough, fever, and addominal pain for the past couple of days.  Temp in ED was 103.  To begin broad abx for empiric coverage for suspected sepsis.  First doses for vancomycin, levaquin, and aztreonam ordered by MD per sepsis protocol. Scr 0.83 (crcl~ 77)   Goal of Therapy:  Vancomycin trough level 15-20 mcg/ml  Plan:  1) give Vancomycin 1000mg  x1 per admit sepsis protocol, then 750mg  IV q12h 2) levaquin 750mg  IV q24h 3) aztreonam 2gm IV q8h  Sumaya Riedesel P 05/06/2012,6:39 PM

## 2012-05-06 NOTE — ED Notes (Signed)
Onset 3 day ago general body achy, cough, fever, and general abdominal pain. Pain constant and worsening today.

## 2012-05-06 NOTE — ED Provider Notes (Signed)
History     CSN: [id number]  Arrival date & time 05/06/12  1800   First MD Initiated Contact with Patient 05/06/12 1811      Chief Complaint  Patient presents with  . Generalized Body Aches  . Cough  . Abdominal Pain    (Consider location/radiation/quality/duration/timing/severity/associated sxs/prior treatment) Patient is a 52 y.o. female presenting with general illness.  Illness  The current episode started 3 to 5 days ago. The onset was gradual. The problem occurs continuously. The problem has been gradually worsening. The problem is severe. Nothing relieves the symptoms. Nothing aggravates the symptoms. Associated symptoms include a fever, abdominal pain and cough. Pertinent negatives include no diarrhea, no nausea, no vomiting and no congestion.    Past Medical History  Diagnosis Date  . Seizures   . Asthma   . Hypertension     History reviewed. No pertinent past surgical history.  No family history on file.  History  Substance Use Topics  . Smoking status: Never Smoker   . Smokeless tobacco: Not on file  . Alcohol Use: Yes    OB History   Grav Para Term Preterm Abortions TAB SAB Ect Mult Living                  Review of Systems  Constitutional: Positive for fever.  HENT: Negative for congestion.   Respiratory: Positive for cough. Negative for shortness of breath.   Cardiovascular: Negative for chest pain.  Gastrointestinal: Positive for abdominal pain. Negative for nausea, vomiting and diarrhea.  Genitourinary: Positive for frequency.  All other systems reviewed and are negative.    Allergies  Penicillins  Home Medications   Current Outpatient Rx  Name  Route  Sig  Dispense  Refill  . albuterol (PROVENTIL HFA;VENTOLIN HFA) 108 (90 BASE) MCG/ACT inhaler   Inhalation   Inhale 2 puffs into the lungs every 6 (six) hours as needed for wheezing or shortness of breath.          Marland Kitchen atenolol (TENORMIN) 50 MG tablet   Oral   Take 50 mg by mouth  daily.         . hydrochlorothiazide (HYDRODIURIL) 25 MG tablet   Oral   Take 1 tablet (25 mg total) by mouth daily.   30 tablet   0   . phenytoin (DILANTIN) 100 MG ER capsule   Oral   Take 300 mg by mouth at bedtime.         Marland Kitchen ibuprofen (ADVIL,MOTRIN) 200 MG tablet   Oral   Take 600 mg by mouth every 6 (six) hours as needed. Pain           BP 126/73  Pulse 62  Temp(Src) 103 F (39.4 C) (Oral)  SpO2 100%  Physical Exam  Nursing note and vitals reviewed. Constitutional: She is oriented to person, place, and time. She appears well-developed and well-nourished. No distress.  HENT:  Head: Normocephalic and atraumatic.  Mouth/Throat: Oropharynx is clear and moist.  Eyes: Conjunctivae are normal. Pupils are equal, round, and reactive to light. No scleral icterus.  Neck: Neck supple.  Cardiovascular: Regular rhythm, normal heart sounds and intact distal pulses.  Tachycardia present.   No murmur heard. Pulmonary/Chest: Effort normal and breath sounds normal. No stridor. No respiratory distress. She has no wheezes. She has no rales.  Abdominal: Soft. Bowel sounds are normal. She exhibits no distension. There is tenderness in the right upper quadrant, right lower quadrant and suprapubic area. There is CVA tenderness (  right). There is no rigidity, no rebound and no guarding.  Musculoskeletal: Normal range of motion.  Neurological: She is alert and oriented to person, place, and time.  Skin: Skin is warm and dry. No rash noted.  Psychiatric: She has a normal mood and affect. Her behavior is normal.    ED Course  Procedures (including critical care time)  Labs Reviewed  CBC WITH DIFFERENTIAL - Abnormal; Notable for the following:    WBC 29.6 (*)    RBC 3.35 (*)    Hemoglobin 10.9 (*)    HCT 30.1 (*)    MCHC 36.2 (*)    Neutrophils Relative 87 (*)    Lymphocytes Relative 6 (*)    Neutro Abs 25.7 (*)    Monocytes Absolute 2.1 (*)    All other components within normal  limits  COMPREHENSIVE METABOLIC PANEL - Abnormal; Notable for the following:    Sodium 131 (*)    Chloride 89 (*)    Glucose, Bld 155 (*)    Albumin 3.2 (*)    Alkaline Phosphatase 119 (*)    GFR calc non Af Amer 80 (*)    All other components within normal limits  URINALYSIS, ROUTINE W REFLEX MICROSCOPIC - Abnormal; Notable for the following:    APPearance CLOUDY (*)    Hgb urine dipstick LARGE (*)    Protein, ur 100 (*)    Leukocytes, UA LARGE (*)    All other components within normal limits  LIPASE, BLOOD - Abnormal; Notable for the following:    Lipase 8 (*)    All other components within normal limits  URINE MICROSCOPIC-ADD ON - Abnormal; Notable for the following:    Bacteria, UA FEW (*)    All other components within normal limits  URINE CULTURE  CULTURE, BLOOD (ROUTINE X 2)  CULTURE, BLOOD (ROUTINE X 2)  LACTIC ACID, PLASMA  PROCALCITONIN  CG4 I-STAT (LACTIC ACID)   Ct Abdomen Pelvis W Contrast  05/06/2012  *RADIOLOGY REPORT*  Clinical Data: 3-day history of generalized body aches, cough, fever, and abdominal pain.  CT ABDOMEN AND PELVIS WITH CONTRAST  Technique:  Multidetector CT imaging of the abdomen and pelvis was performed following the standard protocol during bolus administration of intravenous contrast.  Contrast: OMNIPAQUE IOHEXOL 300 MG/ML  SOLN  Comparison: CT chest abdomen and pelvis 11/29/2009  Findings: Mild dependent atelectasis in the lung bases.  Small esophageal hiatal hernia.  Normal heart size.  The liver, spleen, gallbladder, adrenal glands, abdominal aorta, and inferior vena cava are unremarkable.  The pancreas is atrophic and there are scattered calcifications in the pancreas consistent with chronic pancreatitis.  Mild prominence of retroperitoneal lymph nodes, likely reactive.  There is a heterogeneous appearance of the renal nephrograms bilaterally with focal areas of hypo enhancement in the zonal architecture.  Changes are most consistent with  bilateral pyelonephritis.  No evidence of hydronephrosis and no solid mass demonstrated in the kidneys.  There is mild stranding in the retroperitoneal fat around the right kidney which may be inflammatory.  The stomach and small bowel are decompressed.  Stool filled colon without distension.  Scattered colonic diverticula. No free air or free fluid in the abdomen.  Pelvis:  The appendix is normal.  The bladder wall is mildly thickened suggesting cystitis.  Uterus and adnexal structures are not enlarged.  No free pelvic fluid collections.  No loculated fluid collections.  No evidence of diverticulitis in the sigmoid colon.  Mild degenerative changes in the lumbar spine.  IMPRESSION: Heterogeneous appearance of the renal nephrograms bilaterally suggesting pyelonephritis.  Diffuse thickening of the bladder wall and retroperitoneal infiltration also consistent with inflammatory process.  Changes of chronic pancreatitis.  Small esophageal hiatal hernia.   Original Report Authenticated By: Burman Nieves, M.D.    Dg Chest Portable 1 View  05/06/2012  *RADIOLOGY REPORT*  Clinical Data: Generalized body aches.  Cough and abdominal pain.  PORTABLE CHEST - 1 VIEW  Comparison: Two-view chest 03/24/2012.  Findings: The heart size is normal.  The lungs are clear. Mild degenerative changes of the shoulders bilaterally are stable.  The visualized soft tissues and bony thorax are unremarkable.  IMPRESSION:  1.  Mild degenerative changes of the shoulders. 2.  No acute abnormality.   Original Report Authenticated By: Marin Roberts, M.D.   All radiology studies independently viewed by me.      1. Sepsis   2. Pyelonephritis       MDM   52 yo female with fevers, aches, productive cough, and abdominal pain for several days.  Unclear source of fever, but febrile and tachycardic on arrival.  Plan IV fluids and prophylactic antibiotics per hospital protocol.    7:17 PM CXR shows no pnuemonia, so plan to obtain CT  Abd/pel to assess for infection.    11:46 PM CT showed pyelonephritis.  Have consulted hospitalist for admission.       Rennis Petty, MD 05/06/12 2352

## 2012-05-06 NOTE — ED Notes (Signed)
Antibiotics hung after blood cultures drawn.

## 2012-05-06 NOTE — ED Notes (Signed)
Patient transported to CT 

## 2012-05-06 NOTE — ED Notes (Signed)
Pt finished both cups of contrast. CT notified

## 2012-05-06 NOTE — ED Provider Notes (Signed)
I saw and evaluated the patient, reviewed the resident's note and I agree with the findings and plan.  Patient seen and examined. Blood pressure noted and patient given IV fluids. Patient is febrile from likely her urine. Suspect that she is uroseptic. We'll call code sepsis and patient to be admitted  Toy Baker, MD 05/06/12 2131

## 2012-05-07 DIAGNOSIS — G40909 Epilepsy, unspecified, not intractable, without status epilepticus: Secondary | ICD-10-CM

## 2012-05-07 DIAGNOSIS — J069 Acute upper respiratory infection, unspecified: Secondary | ICD-10-CM | POA: Diagnosis present

## 2012-05-07 DIAGNOSIS — N12 Tubulo-interstitial nephritis, not specified as acute or chronic: Secondary | ICD-10-CM

## 2012-05-07 DIAGNOSIS — A419 Sepsis, unspecified organism: Secondary | ICD-10-CM

## 2012-05-07 DIAGNOSIS — J45909 Unspecified asthma, uncomplicated: Secondary | ICD-10-CM | POA: Diagnosis present

## 2012-05-07 DIAGNOSIS — I10 Essential (primary) hypertension: Secondary | ICD-10-CM

## 2012-05-07 LAB — BASIC METABOLIC PANEL
BUN: 7 mg/dL (ref 6–23)
CO2: 26 mEq/L (ref 19–32)
Calcium: 8.4 mg/dL (ref 8.4–10.5)
Creatinine, Ser: 0.82 mg/dL (ref 0.50–1.10)
Glucose, Bld: 142 mg/dL — ABNORMAL HIGH (ref 70–99)
Sodium: 136 mEq/L (ref 135–145)

## 2012-05-07 LAB — INFLUENZA PANEL BY PCR (TYPE A & B): Influenza B By PCR: NEGATIVE

## 2012-05-07 LAB — CBC
HCT: 29.4 % — ABNORMAL LOW (ref 36.0–46.0)
Hemoglobin: 10.3 g/dL — ABNORMAL LOW (ref 12.0–15.0)
MCH: 31.6 pg (ref 26.0–34.0)
MCV: 90.2 fL (ref 78.0–100.0)
RBC: 3.26 MIL/uL — ABNORMAL LOW (ref 3.87–5.11)

## 2012-05-07 MED ORDER — FOLIC ACID 1 MG PO TABS
1.0000 mg | ORAL_TABLET | Freq: Every day | ORAL | Status: DC
  Administered 2012-05-07 – 2012-05-11 (×5): 1 mg via ORAL
  Filled 2012-05-07 (×5): qty 1

## 2012-05-07 MED ORDER — LORAZEPAM 2 MG/ML IJ SOLN
1.0000 mg | Freq: Four times a day (QID) | INTRAMUSCULAR | Status: AC | PRN
  Administered 2012-05-07: 1 mg via INTRAVENOUS
  Filled 2012-05-07: qty 1

## 2012-05-07 MED ORDER — POTASSIUM CHLORIDE CRYS ER 20 MEQ PO TBCR: 40.0000 meq | EXTENDED_RELEASE_TABLET | Freq: Two times a day (BID) | ORAL | Status: DC

## 2012-05-07 MED ORDER — ALBUTEROL SULFATE (5 MG/ML) 0.5% IN NEBU: 2.5000 mg | INHALATION_SOLUTION | RESPIRATORY_TRACT | Status: DC | PRN

## 2012-05-07 MED ORDER — VITAMIN B-1 100 MG PO TABS
100.0000 mg | ORAL_TABLET | Freq: Every day | ORAL | Status: DC
  Administered 2012-05-07 – 2012-05-11 (×5): 100 mg via ORAL
  Filled 2012-05-07 (×5): qty 1

## 2012-05-07 MED ORDER — ZOLPIDEM TARTRATE 5 MG PO TABS: 5.0000 mg | ORAL_TABLET | Freq: Every evening | ORAL | Status: DC | PRN

## 2012-05-07 MED ORDER — ACETAMINOPHEN 325 MG PO TABS
650.0000 mg | ORAL_TABLET | Freq: Four times a day (QID) | ORAL | Status: DC | PRN
Start: 2012-05-07 — End: 2012-05-11
  Administered 2012-05-07 – 2012-05-11 (×4): 650 mg via ORAL
  Filled 2012-05-07 (×4): qty 2

## 2012-05-07 MED ORDER — ADULT MULTIVITAMIN W/MINERALS CH
1.0000 | ORAL_TABLET | Freq: Every day | ORAL | Status: DC
  Administered 2012-05-07 – 2012-05-11 (×5): 1 via ORAL
  Filled 2012-05-07 (×5): qty 1

## 2012-05-07 MED ORDER — LORAZEPAM 1 MG PO TABS: 1.0000 mg | ORAL_TABLET | Freq: Four times a day (QID) | ORAL | Status: AC | PRN

## 2012-05-07 MED ORDER — LEVOFLOXACIN IN D5W 750 MG/150ML IV SOLN: 750.0000 mg | INTRAVENOUS | Status: DC

## 2012-05-07 MED ORDER — ENOXAPARIN SODIUM 40 MG/0.4ML ~~LOC~~ SOLN
40.0000 mg | SUBCUTANEOUS | Status: DC
  Administered 2012-05-07 – 2012-05-10 (×4): 40 mg via SUBCUTANEOUS
  Filled 2012-05-07 (×5): qty 0.4

## 2012-05-07 MED ORDER — OSELTAMIVIR PHOSPHATE 75 MG PO CAPS
75.0000 mg | ORAL_CAPSULE | Freq: Two times a day (BID) | ORAL | Status: DC
  Administered 2012-05-07: 75 mg via ORAL
  Filled 2012-05-07 (×3): qty 1

## 2012-05-07 MED ORDER — PHENYTOIN SODIUM EXTENDED 100 MG PO CAPS
300.0000 mg | ORAL_CAPSULE | Freq: Every day | ORAL | Status: DC
  Administered 2012-05-07 – 2012-05-10 (×5): 300 mg via ORAL
  Filled 2012-05-07 (×5): qty 3

## 2012-05-07 MED ORDER — ALBUTEROL SULFATE HFA 108 (90 BASE) MCG/ACT IN AERS: 2.0000 | INHALATION_SPRAY | Freq: Four times a day (QID) | RESPIRATORY_TRACT | Status: DC | PRN

## 2012-05-07 MED ORDER — ATENOLOL 50 MG PO TABS
50.0000 mg | ORAL_TABLET | Freq: Every day | ORAL | Status: DC
  Administered 2012-05-07 – 2012-05-10 (×4): 50 mg via ORAL
  Filled 2012-05-07 (×5): qty 1

## 2012-05-07 MED ORDER — ONDANSETRON HCL 4 MG/2ML IJ SOLN
4.0000 mg | Freq: Four times a day (QID) | INTRAMUSCULAR | Status: DC | PRN
  Administered 2012-05-07: 4 mg via INTRAVENOUS
  Filled 2012-05-07 (×2): qty 2

## 2012-05-07 MED ORDER — POTASSIUM CHLORIDE 10 MEQ/100ML IV SOLN
10.0000 meq | INTRAVENOUS | Status: AC
  Administered 2012-05-07 (×4): 10 meq via INTRAVENOUS
  Filled 2012-05-07 (×3): qty 100

## 2012-05-07 MED ORDER — DEXTROSE-NACL 5-0.9 % IV SOLN
INTRAVENOUS | Status: DC
  Administered 2012-05-07 – 2012-05-09 (×7): via INTRAVENOUS

## 2012-05-07 MED ORDER — THIAMINE HCL 100 MG/ML IJ SOLN
100.0000 mg | Freq: Every day | INTRAMUSCULAR | Status: DC
  Filled 2012-05-07 (×5): qty 1

## 2012-05-07 MED ORDER — ALBUTEROL SULFATE (5 MG/ML) 0.5% IN NEBU: 2.5000 mg | INHALATION_SOLUTION | Freq: Four times a day (QID) | RESPIRATORY_TRACT | Status: DC

## 2012-05-07 MED ORDER — DOCUSATE SODIUM 100 MG PO CAPS
100.0000 mg | ORAL_CAPSULE | Freq: Two times a day (BID) | ORAL | Status: DC
  Administered 2012-05-07: 100 mg via ORAL
  Filled 2012-05-07 (×2): qty 1

## 2012-05-07 MED ORDER — PHENYTOIN SODIUM EXTENDED 100 MG PO CAPS
300.0000 mg | ORAL_CAPSULE | ORAL | Status: AC
  Administered 2012-05-07: 300 mg via ORAL
  Filled 2012-05-07 (×3): qty 3

## 2012-05-07 MED ORDER — ONDANSETRON HCL 4 MG PO TABS: 4.0000 mg | ORAL_TABLET | Freq: Four times a day (QID) | ORAL | Status: DC | PRN

## 2012-05-07 MED ORDER — POTASSIUM CHLORIDE 10 MEQ/100ML IV SOLN
INTRAVENOUS | Status: AC
  Filled 2012-05-07: qty 100

## 2012-05-07 NOTE — Progress Notes (Signed)
RT completed protocol assessment of patients.  Patient has a history of asthma and takes albuterol inhaler as needed at home.  Chest xray report stated that her lungs are clear on xray.  Admission complaint was body aches, productive cough, and pain.  Patients vitals are stable.  BBS clear, HR 102, RR 16, Sats 100% on RA.  Decreased treatments to Albuterol nebulizer q4prn.  Patient made aware that if she needs a breathing treatment to let her RN know and RT will be called.  RT will continue to monitor patient.

## 2012-05-07 NOTE — H&P (Signed)
[facility] History and Physical  MARLEN KOMAN ZOX:[id number] DOB: 11/10/1960    PCP:   Leda Min, MD   Chief Complaint: Yellow sputum productive cough, fever, right flank pain.  HPI: Brandy Bishop is an 52 y.o. female with history of seizure, though noncompliant with her medication and had not taken her Dilantin for quite a while, hypertension on beta blocker, chronic asthma on inhalers, nonsmoker, presents to the emergency room with three-day history of yellow sputum productive cough, dysuria, polyuria, diffuse myalgia, and right flank pain. She lives with her fianc and he is not ill. She had not taken her flu shot this year. She denied any distant travel or any ill contact. Evaluation in emergency room included a chest x-ray which did not show any infiltrate, a urinalysis strongly positive for UTI, leukocytosis with white count of 29,000, and normal renal function tests. Her liver function tests, lipase, and lactic acid were normal. An abdominal pelvic CT showed evidence of bilateral pyelonephritis without hydronephrosis, chronic pancreatitis, and inflammatory changes in the bladder. Hospitalist was asked to admit her for pyelonephritis. When she was first seen in emergency room she had hypotension with systolic of 80 mm Hg. Fortunately, she did respond to IV fluid promptly with a resultant systolic blood pressure of 110.  Rewiew of Systems:  Constitutional: Negative for malaise,No significant weight loss or weight gain Eyes: Negative for eye pain, redness and discharge, diplopia, visual changes, or flashes of light. ENMT: Negative for ear pain, hoarseness, nasal congestion, sinus pressure and sore throat. No headaches; tinnitus, drooling, or problem swallowing. Cardiovascular: Negative for chest pain, palpitations, diaphoresis, dyspnea and peripheral edema. ; No orthopnea, PND Respiratory: Negative for  hemoptysis, wheezing and stridor. No pleuritic  chestpain. Gastrointestinal: Negative for nausea, vomiting, diarrhea, constipation, abdominal pain, melena, blood in stool, hematemesis, jaundice and rectal bleeding.    Genitourinary: Negative for frequency, dysuria, incontinence,flank pain and hematuria; Musculoskeletal: Negative for back pain and neck pain. Negative for swelling and trauma.;  Skin: . Negative for pruritus, rash, abrasions, bruising and skin lesion.; ulcerations Neuro: Negative for headache, lightheadedness and neck stiffness. Negative for weakness, altered level of consciousness , altered mental status, extremity weakness, burning feet, involuntary movement, seizure and syncope.  Psych: negative for anxiety, depression, insomnia, tearfulness, panic attacks, hallucinations, paranoia, suicidal or homicidal ideation    Past Medical History  Diagnosis Date  . Seizures   . Asthma   . Hypertension     History reviewed. No pertinent past surgical history.  Medications:  HOME MEDS: Prior to Admission medications   Medication Sig Start Date End Date Taking? Authorizing Provider  albuterol (PROVENTIL HFA;VENTOLIN HFA) 108 (90 BASE) MCG/ACT inhaler Inhale 2 puffs into the lungs every 6 (six) hours as needed for wheezing or shortness of breath.    Yes Historical Provider, MD  atenolol (TENORMIN) 50 MG tablet Take 50 mg by mouth daily.   Yes Historical Provider, MD  hydrochlorothiazide (HYDRODIURIL) 25 MG tablet Take 1 tablet (25 mg total) by mouth daily. 03/24/12  Yes Carleene Cooper III, MD  phenytoin (DILANTIN) 100 MG ER capsule Take 300 mg by mouth at bedtime.   Yes Historical Provider, MD  ibuprofen (ADVIL,MOTRIN) 200 MG tablet Take 600 mg by mouth every 6 (six) hours as needed. Pain    Historical Provider, MD     Allergies:  Allergies  Allergen Reactions  . Penicillins Swelling    Social History:   reports that she has never smoked. She does not have any  smokeless tobacco history on file. She reports that  drinks  alcohol. She reports that she does not use illicit drugs.  Family History: No family history on file.   Physical Exam: Filed Vitals:   05/06/12 2030 05/06/12 2126 05/06/12 2136 05/06/12 2200  BP: 96/60 125/69 110/70 126/88  Pulse: 109 98  107  Temp:  99.8 F (37.7 C)    TempSrc:  Oral    Resp: 21 25  22   SpO2: 98% 100%  100%   Blood pressure 126/88, pulse 107, temperature 99.8 F (37.7 C), temperature source Oral, resp. rate 22, SpO2 100.00%.  GEN:  Pleasant patient lying in the stretcher in no acute distress; cooperative with exam. PSYCH:  alert and oriented x4; does not appear anxious or depressed; affect is appropriate. HEENT: Mucous membranes pink and anicteric; PERRLA; EOM intact; no cervical lymphadenopathy nor thyromegaly or carotid bruit; no JVD; There were no stridor. Neck is very supple. Breasts:: Not examined CHEST WALL: No tenderness. She does have right flank pain. CHEST: Normal respiration, clear to auscultation bilaterally.  HEART: Regular rate and rhythm.  There are no murmur, rub, or gallops.   BACK: No kyphosis or scoliosis; no CVA tenderness ABDOMEN: soft and non-tender; no masses, no organomegaly, normal abdominal bowel sounds; no pannus; no intertriginous candida. There is no rebound and no distention. Rectal Exam: Not done EXTREMITIES: No bone or joint deformity; age-appropriate arthropathy of the hands and knees; no edema; no ulcerations.  There is no calf tenderness. Genitalia: not examined PULSES: 2+ and symmetric SKIN: Normal hydration no rash or ulceration CNS: Cranial nerves 2-12 grossly intact no focal lateralizing neurologic deficit.  Speech is fluent; uvula elevated with phonation, facial symmetry and tongue midline. DTR are normal bilaterally, cerebella exam is intact, barbinski is negative and strengths are equaled bilaterally.  No sensory loss.   Labs on Admission:  Basic Metabolic Panel:  Recent Labs Lab 05/06/12 1905  NA 131*  K 3.9  CL  89*  CO2 29  GLUCOSE 155*  BUN 8  CREATININE 0.83  CALCIUM 9.2   Liver Function Tests:  Recent Labs Lab 05/06/12 1905  AST 18  ALT 9  ALKPHOS 119*  BILITOT 0.4  PROT 8.0  ALBUMIN 3.2*    Recent Labs Lab 05/06/12 1905  LIPASE 8*   No results found for this basename: AMMONIA,  in the last 168 hours CBC:  Recent Labs Lab 05/06/12 1905  WBC 29.6*  NEUTROABS 25.7*  HGB 10.9*  HCT 30.1*  MCV 89.9  PLT 338   Cardiac Enzymes: No results found for this basename: CKTOTAL, CKMB, CKMBINDEX, TROPONINI,  in the last 168 hours  CBG: No results found for this basename: GLUCAP,  in the last 168 hours   Radiological Exams on Admission: Ct Abdomen Pelvis W Contrast  05/06/2012  *RADIOLOGY REPORT*  Clinical Data: 3-day history of generalized body aches, cough, fever, and abdominal pain.  CT ABDOMEN AND PELVIS WITH CONTRAST  Technique:  Multidetector CT imaging of the abdomen and pelvis was performed following the standard protocol during bolus administration of intravenous contrast.  Contrast: OMNIPAQUE IOHEXOL 300 MG/ML  SOLN  Comparison: CT chest abdomen and pelvis 11/29/2009  Findings: Mild dependent atelectasis in the lung bases.  Small esophageal hiatal hernia.  Normal heart size.  The liver, spleen, gallbladder, adrenal glands, abdominal aorta, and inferior vena cava are unremarkable.  The pancreas is atrophic and there are scattered calcifications in the pancreas consistent with chronic pancreatitis.  Mild prominence  of retroperitoneal lymph nodes, likely reactive.  There is a heterogeneous appearance of the renal nephrograms bilaterally with focal areas of hypo enhancement in the zonal architecture.  Changes are most consistent with bilateral pyelonephritis.  No evidence of hydronephrosis and no solid mass demonstrated in the kidneys.  There is mild stranding in the retroperitoneal fat around the right kidney which may be inflammatory.  The stomach and small bowel are  decompressed.  Stool filled colon without distension.  Scattered colonic diverticula. No free air or free fluid in the abdomen.  Pelvis:  The appendix is normal.  The bladder wall is mildly thickened suggesting cystitis.  Uterus and adnexal structures are not enlarged.  No free pelvic fluid collections.  No loculated fluid collections.  No evidence of diverticulitis in the sigmoid colon.  Mild degenerative changes in the lumbar spine.  IMPRESSION: Heterogeneous appearance of the renal nephrograms bilaterally suggesting pyelonephritis.  Diffuse thickening of the bladder wall and retroperitoneal infiltration also consistent with inflammatory process.  Changes of chronic pancreatitis.  Small esophageal hiatal hernia.   Original Report Authenticated By: Burman Nieves, M.D.    Dg Chest Portable 1 View  05/06/2012  *RADIOLOGY REPORT*  Clinical Data: Generalized body aches.  Cough and abdominal pain.  PORTABLE CHEST - 1 VIEW  Comparison: Two-view chest 03/24/2012.  Findings: The heart size is normal.  The lungs are clear. Mild degenerative changes of the shoulders bilaterally are stable.  The visualized soft tissues and bony thorax are unremarkable.  IMPRESSION:  1.  Mild degenerative changes of the shoulders. 2.  No acute abnormality.   Original Report Authenticated By: Marin Roberts, M.D.     Assessment/Plan Present on Admission:  . Viral URI with cough . Seizure disorder . Asthma with bronchitis Acute pyelonephritis  PLAN:  Because of her low blood pressure originally, sepsis was considered, and she was placed on broad-spectrum antibiotics to include vancomycin, aztreonam and Levaquin.  I will continue these 3 medications for now. I suspect that she has pyelonephritis, along with an upper respiratory infection. Because of myalgia, URI symptoms, I will also check influenza PCR and empirically start her on Tamiflu with proper droplet precaution. With respect to her seizure disorder, she had not taken  any Dilantin, and I will give her a loading dose intravenously, then continue her oral Dilantin. Will check another level tomorrow after loading dose. The broad-spectrum antibiotic should cover asthmatic bronchitis, and she will be given nebulizer treatments also. I have resumed her beta blocker for hypertension, but if her blood pressure is low, please hold her HTN medication. She is stable, full code, and will be [facility]. Thank you for allowing me to participate in the care of your nice patient  Other plans as per orders.  Code Status: FULL Unk Lightning, MD. [facility] Pager [phone] 7pm to 7am.  05/07/2012,

## 2012-05-07 NOTE — Progress Notes (Signed)
The patient Brandy Bishop has not taken her Dilantin in >28mo, to reload.  Creatinine, Ser  Date Value Range Status  05/06/2012 0.83  0.50 - 1.10 mg/dL Final   The patient will be loaded with 900 mg of phenytoin by Oral route. Maintenance dose will be continued as present at 300mg  QHS.  Will continue to monitor phenytoin levels, albumin and creatinine as needed for dosing adjustments.   Increase for maintenance dose:  Increase by 100 mg per day for level less than 7; Increase by 50 mg per day for level between 7-12; Level of 12 and greater: No change. Patients with levels greater than 18 will be assessed for need for a free phenytoin level and for side effects (i.e. drowsiness, ataxia, lethargy, nausea, vomiting) and doses lowered as appropriate.  Colleen Can 05/07/2012 2:42 AM

## 2012-05-07 NOTE — Progress Notes (Signed)
Rn notified that patient blood culture was positive in aerobic tube with gram negative rods.  MD notified

## 2012-05-07 NOTE — Progress Notes (Addendum)
[facility] PROGRESS NOTE  Brandy Bishop:[id number] DOB: 28-Jan-1961 DOA: 05/06/2012 PCP: Leda Min, MD  Assessment/Plan: 1. Early sepsis: patient presents with hypotension, tachycardia, fever, leukocytosis, pyelonephritis and has bacteremia. IV fluids, Continue with IV antibiotics. BP stable.  2. Pyelonephritis: IV Aztreonam and Levaquin, day 2. Follow urine culture.  3. Bacteremia, Gram negative rod. Levaquin and Aztreonam should cover.  4. Chronic Pancreatitis; Counseling provide regarding alcohol intake.  5. History Seizure: Continue with Phenytoin.  6. History alcohol use: Monitor on CIWA. Continue with  Thiamine and folate.  7. Influenza negative.  8. Addendum: Hypokalemia: IV kcl 10 meq times 4 runs.   Code Status: Full  Family Communication: Care discussed with patient.  Disposition Plan: home when stable   Consultants:  none  Procedures: none  Antibiotics:  Vancomycin 4-2  Levaquin 4-2  Aztreonam 4-2  HPI/Subjective: Still feeling sick, relates suprapubic pain. Relates nausea, does not want to eats.   Objective: Filed Vitals:   05/07/12 0200 05/07/12 0629 05/07/12 0949 05/07/12 1333  BP: 137/80 132/75 114/76 97/64  Pulse: 108 122 120 82  Temp: 98.4 F (36.9 C) 101.2 F (38.4 C) 99.6 F (37.6 C) 98.2 F (36.8 C)  TempSrc: Oral Oral Oral Oral  Resp: 20 20 22 20   Height: 5\' 3"  (1.6 m)     Weight: 55.8 kg (123 lb 0.3 oz)     SpO2: 100% 98% 99% 99%    Intake/Output Summary (Last 24 hours) at 05/07/12 1723 Last data filed at 05/07/12 1452  Gross per 24 hour  Intake   1506 ml  Output   2000 ml  Net   -494 ml   Filed Weights   05/07/12 0200  Weight: 55.8 kg (123 lb 0.3 oz)    Exam:   General:  No distress.   Cardiovascular: S 1, S 2 RRR  Respiratory: CTA  Abdomen: BS present, soft, mild suprapubic tenderness, no rigidity.   Musculoskeletal: no edema  Data Reviewed: Basic Metabolic Panel:  Recent Labs Lab  05/06/12 1905 05/07/12 0638  NA 131* 136  K 3.9 3.0*  CL 89* 99  CO2 29 26  GLUCOSE 155* 142*  BUN 8 7  CREATININE 0.83 0.82  CALCIUM 9.2 8.4   Liver Function Tests:  Recent Labs Lab 05/06/12 1905  AST 18  ALT 9  ALKPHOS 119*  BILITOT 0.4  PROT 8.0  ALBUMIN 3.2*    Recent Labs Lab 05/06/12 1905  LIPASE 8*   No results found for this basename: AMMONIA,  in the last 168 hours CBC:  Recent Labs Lab 05/06/12 1905 05/07/12 0638  WBC 29.6* 30.1*  NEUTROABS 25.7*  --   HGB 10.9* 10.3*  HCT 30.1* 29.4*  MCV 89.9 90.2  PLT 338 311   Cardiac Enzymes: No results found for this basename: CKTOTAL, CKMB, CKMBINDEX, TROPONINI,  in the last 168 hours BNP (last 3 results) No results found for this basename: PROBNP,  in the last 8760 hours CBG: No results found for this basename: GLUCAP,  in the last 168 hours  Recent Results (from the past 240 hour(s))  CULTURE, BLOOD (ROUTINE X 2)     Status: None   Collection Time    05/06/12  7:10 PM      Result Value Range Status   Specimen Description BLOOD LEFT ANTECUBITAL   Final   Special Requests BOTTLES DRAWN AEROBIC ONLY 5CC   Final   Culture  Setup Time 05/07/2012 02:52   Final   Culture  Final   Value: GRAM NEGATIVE RODS     Note: Gram Stain Report Called to,Read Back By and Verified With: LAUREN LEE 05/07/12 1400 BY SMITHERSJ   Report Status PENDING   Incomplete     Studies: Ct Abdomen Pelvis W Contrast  05/06/2012  *RADIOLOGY REPORT*  Clinical Data: 3-day history of generalized body aches, cough, fever, and abdominal pain.  CT ABDOMEN AND PELVIS WITH CONTRAST  Technique:  Multidetector CT imaging of the abdomen and pelvis was performed following the standard protocol during bolus administration of intravenous contrast.  Contrast: OMNIPAQUE IOHEXOL 300 MG/ML  SOLN  Comparison: CT chest abdomen and pelvis 11/29/2009  Findings: Mild dependent atelectasis in the lung bases.  Small esophageal hiatal hernia.  Normal  heart size.  The liver, spleen, gallbladder, adrenal glands, abdominal aorta, and inferior vena cava are unremarkable.  The pancreas is atrophic and there are scattered calcifications in the pancreas consistent with chronic pancreatitis.  Mild prominence of retroperitoneal lymph nodes, likely reactive.  There is a heterogeneous appearance of the renal nephrograms bilaterally with focal areas of hypo enhancement in the zonal architecture.  Changes are most consistent with bilateral pyelonephritis.  No evidence of hydronephrosis and no solid mass demonstrated in the kidneys.  There is mild stranding in the retroperitoneal fat around the right kidney which may be inflammatory.  The stomach and small bowel are decompressed.  Stool filled colon without distension.  Scattered colonic diverticula. No free air or free fluid in the abdomen.  Pelvis:  The appendix is normal.  The bladder wall is mildly thickened suggesting cystitis.  Uterus and adnexal structures are not enlarged.  No free pelvic fluid collections.  No loculated fluid collections.  No evidence of diverticulitis in the sigmoid colon.  Mild degenerative changes in the lumbar spine.  IMPRESSION: Heterogeneous appearance of the renal nephrograms bilaterally suggesting pyelonephritis.  Diffuse thickening of the bladder wall and retroperitoneal infiltration also consistent with inflammatory process.  Changes of chronic pancreatitis.  Small esophageal hiatal hernia.   Original Report Authenticated By: Burman Nieves, M.D.    Dg Chest Portable 1 View  05/06/2012  *RADIOLOGY REPORT*  Clinical Data: Generalized body aches.  Cough and abdominal pain.  PORTABLE CHEST - 1 VIEW  Comparison: Two-view chest 03/24/2012.  Findings: The heart size is normal.  The lungs are clear. Mild degenerative changes of the shoulders bilaterally are stable.  The visualized soft tissues and bony thorax are unremarkable.  IMPRESSION:  1.  Mild degenerative changes of the shoulders. 2.  No  acute abnormality.   Original Report Authenticated By: Marin Roberts, M.D.     Scheduled Meds: . atenolol  50 mg Oral Daily  . aztreonam  2 g Intravenous Q8H  . docusate sodium  100 mg Oral BID  . enoxaparin (LOVENOX) injection  40 mg Subcutaneous Q24H  . folic acid  1 mg Oral Daily  . levofloxacin (LEVAQUIN) IV  750 mg Intravenous Q24H  . multivitamin with minerals  1 tablet Oral Daily  . phenytoin  300 mg Oral QHS  . thiamine  100 mg Oral Daily   Or  . thiamine  100 mg Intravenous Daily  . vancomycin  750 mg Intravenous Q12H   Continuous Infusions: . dextrose 5 % and 0.9% NaCl 125 mL/hr at 05/07/12 1007    Principal Problem:   Pyelonephritis Active Problems:   Viral URI with cough   Seizure disorder   Asthma with bronchitis   HTN (hypertension)    Time  spent: 25 minutes.     Mykale Gandolfo  [facility] Pager [phone]. If 7PM-7AM, please contact night-coverage at [email or web address], password [facility] 05/07/2012, 5:23 PM  LOS: 1 day

## 2012-05-07 NOTE — ED Provider Notes (Signed)
I saw and evaluated the patient, reviewed the resident's note and I agree with the findings and plan.  Aliya Sol T Reeva Davern, MD 05/07/12 1145 

## 2012-05-07 NOTE — Progress Notes (Signed)
Pt isolation precautions dc per protocol. Pcr for flu was negative

## 2012-05-07 NOTE — Progress Notes (Signed)
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 She was hesitant initially to verify how much she drank until I explained the importance of the information in an effort to keep her safe and medically stable. [facility] was notified and the CIWA protocol was initiated.

## 2012-05-08 LAB — URINE CULTURE

## 2012-05-08 LAB — CBC
HCT: 27.1 % — ABNORMAL LOW (ref 36.0–46.0)
Hemoglobin: 9.4 g/dL — ABNORMAL LOW (ref 12.0–15.0)
MCH: 31.6 pg (ref 26.0–34.0)
RBC: 2.97 MIL/uL — ABNORMAL LOW (ref 3.87–5.11)

## 2012-05-08 LAB — BASIC METABOLIC PANEL
CO2: 25 mEq/L (ref 19–32)
Calcium: 8.2 mg/dL — ABNORMAL LOW (ref 8.4–10.5)
Glucose, Bld: 100 mg/dL — ABNORMAL HIGH (ref 70–99)
Sodium: 135 mEq/L (ref 135–145)

## 2012-05-08 LAB — PHENYTOIN LEVEL, TOTAL: Phenytoin Lvl: 8.7 ug/mL — ABNORMAL LOW (ref 10.0–20.0)

## 2012-05-08 LAB — CLOSTRIDIUM DIFFICILE BY PCR: Toxigenic C. Difficile by PCR: NEGATIVE

## 2012-05-08 MED ORDER — WHITE PETROLATUM GEL
Status: AC
  Administered 2012-05-08: 11:00:00
  Filled 2012-05-08: qty 5

## 2012-05-08 NOTE — Progress Notes (Signed)
[facility] PROGRESS NOTE  Brandy Bishop ZOX:[id number] DOB: [birth year] DOA: 05/06/2012 PCP: Leda Min, MD  Assessment/Plan: 1. Early sepsis: patient presents with hypotension, tachycardia, fever, leukocytosis, pyelonephritis and has bacteremia. IV fluids, Continue with IV antibiotics. BP stable.  2. Pyelonephritis: IV Aztreonam and Levaquin, day 3. urine culture gram negative rods. . WBC trending down. 3. Bacteremia, Gram negative rod. Levaquin and Aztreonam should cover. Repeat blood culture tomorrow.  4. Chronic Pancreatitis; Counseling provide regarding alcohol intake.  5. History Seizure: Continue with Phenytoin.  6. History alcohol use: Monitor on CIWA. Continue with  Thiamine and folate. No evidence of DT.  7. Influenza negative.  8. Hypokalemia: resolved. K at 3.6.  9. Diarrhea: Check C diff. If C diff negative will consider pancreatic enzymes supplement.   Code Status: Full  Family Communication: Care discussed with patient.  Disposition Plan: home when stable   Consultants:  none  Procedures: none  Antibiotics:  Vancomycin 4-2 --4-4  Levaquin 4-2  Aztreonam 4-2  HPI/Subjective: Feeling better today. Relates diarrhea 3 episodes last night. One this am.  No nausea. Wants to eat.   Objective: Filed Vitals:   05/07/12 1815 05/07/12 2220 05/08/12 0211 05/08/12 0613  BP: 116/72 98/61 117/87 126/82  Pulse: 97 88 95 90  Temp: 99.1 F (37.3 C) 98.8 F (37.1 C) 98.2 F (36.8 C) 98.6 F (37 C)  TempSrc: Oral Oral    Resp: 20 17 17 19   Height:      Weight:      SpO2: 95% 96% 99% 99%    Intake/Output Summary (Last 24 hours) at 05/08/12 0915 Last data filed at 05/08/12 0620  Gross per 24 hour  Intake 2480.73 ml  Output   1500 ml  Net 980.73 ml   Filed Weights   05/07/12 0200  Weight: 55.8 kg (123 lb 0.3 oz)    Exam:   General:  No distress.   Cardiovascular: S 1, S 2 RRR  Respiratory: CTA  Abdomen: BS present, soft, mild  suprapubic tenderness, no rigidity.   Musculoskeletal: no edema  Data Reviewed: Basic Metabolic Panel:  Recent Labs Lab 05/06/12 1905 05/07/12 0638 05/08/12 0510  NA 131* 136 135  K 3.9 3.0* 3.6  CL 89* 99 102  CO2 29 26 25   GLUCOSE 155* 142* 100*  BUN 8 7 6   CREATININE 0.83 0.82 0.73  CALCIUM 9.2 8.4 8.2*  MG  --   --  1.5   Liver Function Tests:  Recent Labs Lab 05/06/12 1905  AST 18  ALT 9  ALKPHOS 119*  BILITOT 0.4  PROT 8.0  ALBUMIN 3.2*    Recent Labs Lab 05/06/12 1905  LIPASE 8*   No results found for this basename: AMMONIA,  in the last 168 hours CBC:  Recent Labs Lab 05/06/12 1905 05/07/12 0638 05/08/12 0510  WBC 29.6* 30.1* 17.5*  NEUTROABS 25.7*  --   --   HGB 10.9* 10.3* 9.4*  HCT 30.1* 29.4* 27.1*  MCV 89.9 90.2 91.2  PLT 338 311 315   Cardiac Enzymes: No results found for this basename: CKTOTAL, CKMB, CKMBINDEX, TROPONINI,  in the last 168 hours BNP (last 3 results) No results found for this basename: PROBNP,  in the last 8760 hours CBG: No results found for this basename: GLUCAP,  in the last 168 hours  Recent Results (from the past 240 hour(s))  CULTURE, BLOOD (ROUTINE X 2)     Status: None   Collection Time    05/06/12  7:00 PM      Result Value Range Status   Specimen Description BLOOD HAND LEFT   Final   Special Requests BOTTLES DRAWN AEROBIC ONLY 5CC   Final   Culture  Setup Time 05/07/2012 02:51   Final   Culture     Final   Value: GRAM NEGATIVE RODS     Note: Gram Stain Report Called to,Read Back By and Verified With: [facility] AT 0351 05/08/12 BY SNOLO   Report Status PENDING   Incomplete  CULTURE, BLOOD (ROUTINE X 2)     Status: None   Collection Time    05/06/12  7:10 PM      Result Value Range Status   Specimen Description BLOOD LEFT ANTECUBITAL   Final   Special Requests BOTTLES DRAWN AEROBIC ONLY 5CC   Final   Culture  Setup Time 05/07/2012 02:52   Final   Culture     Final   Value: GRAM NEGATIVE RODS     Note:  Gram Stain Report Called to,Read Back By and Verified With: LAUREN LEE 05/07/12 1400 BY SMITHERSJ   Report Status PENDING   Incomplete  URINE CULTURE     Status: None   Collection Time    05/06/12  7:25 PM      Result Value Range Status   Specimen Description URINE, CLEAN CATCH   Final   Special Requests NONE   Final   Culture  Setup Time 05/06/2012 20:00   Final   Colony Count >=100,000 COLONIES/ML   Final   Culture GRAM NEGATIVE RODS   Final   Report Status PENDING   Incomplete     Studies: Ct Abdomen Pelvis W Contrast  05/06/2012  *RADIOLOGY REPORT*  Clinical Data: 3-day history of generalized body aches, cough, fever, and abdominal pain.  CT ABDOMEN AND PELVIS WITH CONTRAST  Technique:  Multidetector CT imaging of the abdomen and pelvis was performed following the standard protocol during bolus administration of intravenous contrast.  Contrast: OMNIPAQUE IOHEXOL 300 MG/ML  SOLN  Comparison: CT chest abdomen and pelvis 11/29/2009  Findings: Mild dependent atelectasis in the lung bases.  Small esophageal hiatal hernia.  Normal heart size.  The liver, spleen, gallbladder, adrenal glands, abdominal aorta, and inferior vena cava are unremarkable.  The pancreas is atrophic and there are scattered calcifications in the pancreas consistent with chronic pancreatitis.  Mild prominence of retroperitoneal lymph nodes, likely reactive.  There is a heterogeneous appearance of the renal nephrograms bilaterally with focal areas of hypo enhancement in the zonal architecture.  Changes are most consistent with bilateral pyelonephritis.  No evidence of hydronephrosis and no solid mass demonstrated in the kidneys.  There is mild stranding in the retroperitoneal fat around the right kidney which may be inflammatory.  The stomach and small bowel are decompressed.  Stool filled colon without distension.  Scattered colonic diverticula. No free air or free fluid in the abdomen.  Pelvis:  The appendix is normal.  The  bladder wall is mildly thickened suggesting cystitis.  Uterus and adnexal structures are not enlarged.  No free pelvic fluid collections.  No loculated fluid collections.  No evidence of diverticulitis in the sigmoid colon.  Mild degenerative changes in the lumbar spine.  IMPRESSION: Heterogeneous appearance of the renal nephrograms bilaterally suggesting pyelonephritis.  Diffuse thickening of the bladder wall and retroperitoneal infiltration also consistent with inflammatory process.  Changes of chronic pancreatitis.  Small esophageal hiatal hernia.   Original Report Authenticated By: Burman Nieves, M.D.  Dg Chest Portable 1 View  05/06/2012  *RADIOLOGY REPORT*  Clinical Data: Generalized body aches.  Cough and abdominal pain.  PORTABLE CHEST - 1 VIEW  Comparison: Two-view chest 03/24/2012.  Findings: The heart size is normal.  The lungs are clear. Mild degenerative changes of the shoulders bilaterally are stable.  The visualized soft tissues and bony thorax are unremarkable.  IMPRESSION:  1.  Mild degenerative changes of the shoulders. 2.  No acute abnormality.   Original Report Authenticated By: Marin Roberts, M.D.     Scheduled Meds: . atenolol  50 mg Oral Daily  . aztreonam  2 g Intravenous Q8H  . enoxaparin (LOVENOX) injection  40 mg Subcutaneous Q24H  . folic acid  1 mg Oral Daily  . levofloxacin (LEVAQUIN) IV  750 mg Intravenous Q24H  . multivitamin with minerals  1 tablet Oral Daily  . phenytoin  300 mg Oral QHS  . thiamine  100 mg Oral Daily   Or  . thiamine  100 mg Intravenous Daily   Continuous Infusions: . dextrose 5 % and 0.9% NaCl 125 mL/hr at 05/08/12 0808    Principal Problem:   Pyelonephritis Active Problems:   Viral URI with cough   Seizure disorder   Asthma with bronchitis   HTN (hypertension)    Time spent: 25 minutes.     Raychelle Hudman  [facility] Pager [phone]. If 7PM-7AM, please contact night-coverage at [email or web address], password  [facility] 05/08/2012, 9:15 AM  LOS: 2 days

## 2012-05-08 NOTE — Progress Notes (Signed)
Nurse made aware of second aerobic blood culture showing gram negative rods.

## 2012-05-08 NOTE — Progress Notes (Signed)
ANTIBIOTIC CONSULT NOTE - FOLLOW UP  Pharmacy Consult for Aztreonam + Levaquin & Phenytoin Indication: GNR bacteremia and UTI & Hx seizures  Allergies  Allergen Reactions  . Penicillins Swelling    Patient Measurements: Height: 5\' 3"  (160 cm) Weight: 123 lb 0.3 oz (55.8 kg) IBW/kg (Calculated) : 52.4  Vital Signs: Temp: 98.6 F (37 C) (04/04 1610) Temp src: Oral (04/03 2220) BP: 126/82 mmHg (04/04 0613) Pulse Rate: 90 (04/04 0613) Intake/Output from previous day: 04/03 0701 - 04/04 0700 In: 2480.7 [P.O.:540; I.V.:1725.7; IV Piggyback:215] Out: 1500 [Urine:1500] Intake/Output from this shift:    Labs:  Recent Labs  05/06/12 1905 05/07/12 0638 05/08/12 0510  WBC 29.6* 30.1* 17.5*  HGB 10.9* 10.3* 9.4*  PLT 338 311 315  CREATININE 0.83 0.82 0.73   Estimated Creatinine Clearance: 68.8 ml/min (by C-G formula based on Cr of 0.73). No results found for this basename: VANCOTROUGH, Leodis Binet, VANCORANDOM, GENTTROUGH, GENTPEAK, GENTRANDOM, TOBRATROUGH, TOBRAPEAK, TOBRARND, AMIKACINPEAK, AMIKACINTROU, AMIKACIN,  in the last 72 hours   Microbiology: Recent Results (from the past 720 hour(s))  CULTURE, BLOOD (ROUTINE X 2)     Status: None   Collection Time    05/06/12  7:00 PM      Result Value Range Status   Specimen Description BLOOD HAND LEFT   Final   Special Requests BOTTLES DRAWN AEROBIC ONLY 5CC   Final   Culture  Setup Time 05/07/2012 02:51   Final   Culture     Final   Value: GRAM NEGATIVE RODS     Note: Gram Stain Report Called to,Read Back By and Verified With: [facility] AT 0351 05/08/12 BY SNOLO   Report Status PENDING   Incomplete  CULTURE, BLOOD (ROUTINE X 2)     Status: None   Collection Time    05/06/12  7:10 PM      Result Value Range Status   Specimen Description BLOOD LEFT ANTECUBITAL   Final   Special Requests BOTTLES DRAWN AEROBIC ONLY 5CC   Final   Culture  Setup Time 05/07/2012 02:52   Final   Culture     Final   Value: GRAM NEGATIVE RODS   Note: Gram Stain Report Called to,Read Back By and Verified With: LAUREN LEE 05/07/12 1400 BY SMITHERSJ   Report Status PENDING   Incomplete  URINE CULTURE     Status: None   Collection Time    05/06/12  7:25 PM      Result Value Range Status   Specimen Description URINE, CLEAN CATCH   Final   Special Requests NONE   Final   Culture  Setup Time 05/06/2012 20:00   Final   Colony Count >=100,000 COLONIES/ML   Final   Culture GRAM NEGATIVE RODS   Final   Report Status PENDING   Incomplete    Anti-infectives   Start     Dose/Rate Route Frequency Ordered Stop   05/07/12 2000  levofloxacin (LEVAQUIN) IVPB 750 mg     750 mg 100 mL/hr over 90 Minutes Intravenous Every 24 hours 05/06/12 2018     05/07/12 2000  levofloxacin (LEVAQUIN) IVPB 750 mg  Status:  Discontinued     750 mg 100 mL/hr over 90 Minutes Intravenous Every 24 hours 05/07/12 0217 05/07/12 0236   05/07/12 1000  vancomycin (VANCOCIN) IVPB 750 mg/150 ml premix  Status:  Discontinued     750 mg 150 mL/hr over 60 Minutes Intravenous Every 12 hours 05/06/12 2018 05/08/12 0914   05/07/12 0400  aztreonam (  AZACTAM) 2 g in dextrose 5 % 50 mL IVPB     2 g 100 mL/hr over 30 Minutes Intravenous Every 8 hours 05/06/12 2007     05/07/12 0230  oseltamivir (TAMIFLU) capsule 75 mg  Status:  Discontinued     75 mg Oral 2 times daily 05/07/12 0217 05/07/12 0946   05/06/12 1845  levofloxacin (LEVAQUIN) IVPB 750 mg     750 mg 100 mL/hr over 90 Minutes Intravenous  Once 05/06/12 1831 05/06/12 2218   05/06/12 1845  aztreonam (AZACTAM) 2 g in dextrose 5 % 50 mL IVPB     2 g 100 mL/hr over 30 Minutes Intravenous  Once 05/06/12 1831 05/06/12 2016   05/06/12 1845  vancomycin (VANCOCIN) IVPB 1000 mg/200 mL premix     1,000 mg 200 mL/hr over 60 Minutes Intravenous  Once 05/06/12 1831 05/07/12 0020      Assessment: 52 y.o. F who continues on Aztreonam and Levaquin for GNR UTI and bacteremia (awaiting further speciation). Vancomycin was discontinued  this morning. Tmax/24h: 99.6, WBC trending down 17.5 << 30.1, renal function is stable - SCr 0.73, CrCl~65-70 ml/min. Doses remain appropriate at this time.  Vanc 4/2 >> 4/4 Aztreonam 4/2 >> Levaquin 4/2 >>  4/2 UCx >> GNR (pending speciation) 4/2 BCx >> 2/2 GNR (pending speciation) 4/2 Influenza PCR negative  The patient was also re-loaded with phenytoin orally on 4/3. A random level this morning was 8.7 mcg/ml which corrects to 11.8 mcg/ml and is within the therapeutic range. It is important to note that this level may not truly be reflective of the patient's full load as this usually takes ~24 hours from the completion of the oral load. Therefore, the patient's level may be slightly higher than this. Will plan to continue the patient's outpatient maintenance dose and will not need any additional levels until the patient approaches steady state in 5-7 days.   Total oral phenytoin dose of 900 mg on 4/3 to load Random level on 4/4 a.m: 8.7 (alb 3.2) >> corrects to 11.8 mcg/ml  Goal of Therapy:  Phenytoin level of 10-20 mcg/ml (when corrected for albumin and renal function) Proper antibiotics for infection/cultures adjusted for renal/hepatic function   Plan:  1. Continue Azactam 2g IV every 8 hours 2. Continue Levaquin 750 mg IV every 24 hours 3. Continue Phenytoin ER 300 mg hs 4. Will continue to follow renal function, culture results, LOT, and antibiotic de-escalation plans  5. Will continue to monitor for any s/sx of seizure activity and will plan to obtain a phenytoin level at steady state in 5-7 days.  Georgina Pillion, PharmD, BCPS Clinical Pharmacist Pager: [phone] 05/08/2012 9:30 AM

## 2012-05-09 LAB — BASIC METABOLIC PANEL
BUN: 5 mg/dL — ABNORMAL LOW (ref 6–23)
CO2: 22 mEq/L (ref 19–32)
Chloride: 103 mEq/L (ref 96–112)
GFR calc Af Amer: >90 mL/min (ref >90)
Glucose, Bld: 128 mg/dL — ABNORMAL HIGH (ref 70–99)
Potassium: 3.2 mEq/L — ABNORMAL LOW (ref 3.5–5.1)

## 2012-05-09 LAB — CULTURE, BLOOD (ROUTINE X 2)

## 2012-05-09 LAB — CBC
HCT: 31.4 % — ABNORMAL LOW (ref 36.0–46.0)
Hemoglobin: 10.9 g/dL — ABNORMAL LOW (ref 12.0–15.0)
RBC: 3.45 MIL/uL — ABNORMAL LOW (ref 3.87–5.11)
RDW: 14.8 % (ref 11.5–15.5)
WBC: 12 10*3/uL — ABNORMAL HIGH (ref 4.0–10.5)

## 2012-05-09 MED ORDER — POTASSIUM CHLORIDE CRYS ER 20 MEQ PO TBCR
40.0000 meq | EXTENDED_RELEASE_TABLET | Freq: Two times a day (BID) | ORAL | Status: AC
  Administered 2012-05-09 (×2): 40 meq via ORAL
  Filled 2012-05-09: qty 1
  Filled 2012-05-09: qty 2

## 2012-05-09 MED ORDER — CIPROFLOXACIN HCL 500 MG PO TABS
500.0000 mg | ORAL_TABLET | Freq: Two times a day (BID) | ORAL | Status: DC
  Administered 2012-05-09 – 2012-05-10 (×2): 500 mg via ORAL
  Filled 2012-05-09 (×3): qty 1

## 2012-05-09 NOTE — Progress Notes (Signed)
[facility] PROGRESS NOTE  SHENELLE KLAS ZOX:[id number] DOB: 06/29/1960 DOA: 05/06/2012 PCP: Leda Min, MD  Assessment/Plan: 1. Early sepsis: patient presents with hypotension, tachycardia, fever, leukocytosis, pyelonephritis and has bacteremia. IV fluids, Continue with antibiotics. BP stable.  2. Pyelonephritis: Patient received IV Aztreonam and Levaquin, for 3 day. urine culture E coli. Sensitive to ciprofloxacin. . WBC trending down. Pain better. Day 4 antibiotics.  3. Bacteremia, E coli. Sensitive to ciprofloxacin. Will change antibiotics to ciprofloxacin. Repeat Blood culture today.  4. Chronic Pancreatitis; Counseling provide regarding alcohol intake.  5. History Seizure: Continue with Phenytoin.  6. History alcohol use: Monitor on CIWA. Continue with  Thiamine and folate. No evidence of DT.  7. Influenza negative.  8. Hypokalemia: resolved. K at 3.6.  9. Diarrhea: Check C diff. If C diff negative will consider pancreatic enzymes supplement.   Code Status: Full  Family Communication: Care discussed with patient.  Disposition Plan: home probably 4-7. Will need to follow up repeat blood culture.    Consultants:  none  Procedures: none  Antibiotics:  Vancomycin 4-2 --4-4  Levaquin 4-2  Aztreonam 4-2  HPI/Subjective: Feeling better today. No abdominal pain.   Objective: Filed Vitals:   05/08/12 2049 05/09/12 0659 05/09/12 1100 05/09/12 1307  BP: 115/78 141/87 117/73 121/71  Pulse: 86 90 94 83  Temp: 98.8 F (37.1 C) 98.9 F (37.2 C)  98.2 F (36.8 C)  TempSrc: Oral Oral    Resp: 18 18  20   Height:      Weight:      SpO2: 98% 99%  100%    Intake/Output Summary (Last 24 hours) at 05/09/12 1614 Last data filed at 05/09/12 1531  Gross per 24 hour  Intake 3372.08 ml  Output   2600 ml  Net 772.08 ml   Filed Weights   05/07/12 0200  Weight: 55.8 kg (123 lb 0.3 oz)    Exam:   General:  No distress.   Cardiovascular: S 1, S 2  RRR  Respiratory: CTA  Abdomen: BS present, soft, NT, no rigidity.   Musculoskeletal: no edema  Data Reviewed: Basic Metabolic Panel:  Recent Labs Lab 05/06/12 1905 05/07/12 0638 05/08/12 0510 05/09/12 0300  NA 131* 136 135 138  K 3.9 3.0* 3.6 3.2*  CL 89* 99 102 103  CO2 29 26 25 22   GLUCOSE 155* 142* 100* 128*  BUN 8 7 6  5*  CREATININE 0.83 0.82 0.73 0.68  CALCIUM 9.2 8.4 8.2* 8.2*  MG  --   --  1.5  --    Liver Function Tests:  Recent Labs Lab 05/06/12 1905  AST 18  ALT 9  ALKPHOS 119*  BILITOT 0.4  PROT 8.0  ALBUMIN 3.2*    Recent Labs Lab 05/06/12 1905  LIPASE 8*   No results found for this basename: AMMONIA,  in the last 168 hours CBC:  Recent Labs Lab 05/06/12 1905 05/07/12 0638 05/08/12 0510 05/09/12 0300  WBC 29.6* 30.1* 17.5* 12.0*  NEUTROABS 25.7*  --   --   --   HGB 10.9* 10.3* 9.4* 10.9*  HCT 30.1* 29.4* 27.1* 31.4*  MCV 89.9 90.2 91.2 91.0  PLT 338 311 315 350   Cardiac Enzymes: No results found for this basename: CKTOTAL, CKMB, CKMBINDEX, TROPONINI,  in the last 168 hours BNP (last 3 results) No results found for this basename: PROBNP,  in the last 8760 hours CBG: No results found for this basename: GLUCAP,  in the last 168 hours  Recent  Results (from the past 240 hour(s))  CULTURE, BLOOD (ROUTINE X 2)     Status: None   Collection Time    05/06/12  7:00 PM      Result Value Range Status   Specimen Description BLOOD HAND LEFT   Final   Special Requests BOTTLES DRAWN AEROBIC ONLY 5CC   Final   Culture  Setup Time 05/07/2012 02:51   Final   Culture     Final   Value: GRAM NEGATIVE RODS     Note: Gram Stain Report Called to,Read Back By and Verified With: [facility] AT 0351 05/08/12 BY SNOLO   Report Status PENDING   Incomplete  CULTURE, BLOOD (ROUTINE X 2)     Status: None   Collection Time    05/06/12  7:10 PM      Result Value Range Status   Specimen Description BLOOD LEFT ANTECUBITAL   Final   Special Requests BOTTLES  DRAWN AEROBIC ONLY 5CC   Final   Culture  Setup Time 05/07/2012 02:52   Final   Culture     Final   Value: ESCHERICHIA COLI     Note: Gram Stain Report Called to,Read Back By and Verified With: LAUREN LEE 05/07/12 1400 BY SMITHERSJ   Report Status 05/09/2012 FINAL   Final   Organism ID, Bacteria ESCHERICHIA COLI   Final  URINE CULTURE     Status: None   Collection Time    05/06/12  7:25 PM      Result Value Range Status   Specimen Description URINE, CLEAN CATCH   Final   Special Requests NONE   Final   Culture  Setup Time 05/06/2012 20:00   Final   Colony Count >=100,000 COLONIES/ML   Final   Culture ESCHERICHIA COLI   Final   Report Status 05/08/2012 FINAL   Final   Organism ID, Bacteria ESCHERICHIA COLI   Final  CLOSTRIDIUM DIFFICILE BY PCR     Status: None   Collection Time    05/08/12  5:36 PM      Result Value Range Status   C difficile by pcr NEGATIVE  NEGATIVE Final     Studies: No results found.  Scheduled Meds: . atenolol  50 mg Oral Daily  . aztreonam  2 g Intravenous Q8H  . enoxaparin (LOVENOX) injection  40 mg Subcutaneous Q24H  . folic acid  1 mg Oral Daily  . levofloxacin (LEVAQUIN) IV  750 mg Intravenous Q24H  . multivitamin with minerals  1 tablet Oral Daily  . phenytoin  300 mg Oral QHS  . potassium chloride  40 mEq Oral BID  . thiamine  100 mg Oral Daily   Or  . thiamine  100 mg Intravenous Daily   Continuous Infusions: . dextrose 5 % and 0.9% NaCl 125 mL/hr at 05/09/12 0120    Principal Problem:   Pyelonephritis Active Problems:   Viral URI with cough   Seizure disorder   Asthma with bronchitis   HTN (hypertension)    Time spent: 25 minutes.     Marlin Jarrard  [facility] Pager [phone]. If 7PM-7AM, please contact night-coverage at [email or web address], password [facility] 05/09/2012, 4:14 PM  LOS: 3 days

## 2012-05-10 LAB — CULTURE, BLOOD (ROUTINE X 2)

## 2012-05-10 LAB — BASIC METABOLIC PANEL
BUN: 4 mg/dL — ABNORMAL LOW (ref 6–23)
CO2: 23 mEq/L (ref 19–32)
Chloride: 107 mEq/L (ref 96–112)
Creatinine, Ser: 0.7 mg/dL (ref 0.50–1.10)

## 2012-05-10 LAB — CBC
HCT: 28.4 % — ABNORMAL LOW (ref 36.0–46.0)
MCH: 31.1 pg (ref 26.0–34.0)
MCV: 90.2 fL (ref 78.0–100.0)
RBC: 3.15 MIL/uL — ABNORMAL LOW (ref 3.87–5.11)
RDW: 14.9 % (ref 11.5–15.5)
WBC: 11.7 10*3/uL — ABNORMAL HIGH (ref 4.0–10.5)

## 2012-05-10 MED ORDER — PANCRELIPASE (LIP-PROT-AMYL) 12000-38000 UNITS PO CPEP
1.0000 | ORAL_CAPSULE | Freq: Three times a day (TID) | ORAL | Status: DC
  Administered 2012-05-10 – 2012-05-11 (×2): 1 via ORAL
  Filled 2012-05-10 (×6): qty 1

## 2012-05-10 MED ORDER — CEFUROXIME AXETIL 500 MG PO TABS
500.0000 mg | ORAL_TABLET | Freq: Two times a day (BID) | ORAL | Status: DC
  Administered 2012-05-10 – 2012-05-11 (×2): 500 mg via ORAL
  Filled 2012-05-10 (×4): qty 1

## 2012-05-10 NOTE — Progress Notes (Addendum)
[facility] PROGRESS NOTE  Brandy Bishop AOZ:[id number] DOB: [birth year] DOA: 05/06/2012 PCP: Leda Min, MD  Assessment/Plan: 1. Early sepsis: patient presents with hypotension, tachycardia, fever, leukocytosis, pyelonephritis and has bacteremia. IV fluids, Continue with antibiotics. BP stable. Resolved.  2. Pyelonephritis: Patient received IV Aztreonam and Levaquin, for 3 day. urine culture E coli. Sensitive to ciprofloxacin, ceftriaxone. . WBC trending down. Pain better. Day 5 antibiotics. Will transition to Ceftin.  3. Bacteremia, E coli. Pansensitive. Will change antibiotics to ceftin. Repeat Blood culture 5-5 no growth to date.  4. Chronic Pancreatitis; Counseling provide regarding alcohol intake. Add pancreatic enzymes.  5. History Seizure: Continue with Phenytoin.  6. History alcohol use: Monitor on CIWA. Continue with  Thiamine and folate. No evidence of DT.  7. Influenza negative.  8. Hypokalemia: resolved. K at 3.6.  9. Diarrhea: C diff negative, improved. Add pancreatic enzymes. Avoid lacteus.   Code Status: Full  Family Communication: Care discussed with patient.  Disposition Plan: home probably 4-7. Will need to follow up repeat blood culture.    Consultants:  none  Procedures: none  Antibiotics:  Vancomycin 4-2 --4-4  Levaquin 4-2  Aztreonam 4-2  HPI/Subjective: Feeling better today. No abdominal pain. Diarrhea better.   Objective: Filed Vitals:   05/09/12 1100 05/09/12 1307 05/09/12 2145 05/10/12 0617  BP: 117/73 121/71 161/90 136/78  Pulse: 94 83 80 95  Temp:  98.2 F (36.8 C) 99.5 F (37.5 C) 97.8 F (36.6 C)  TempSrc:   Oral Oral  Resp:  20 19 18   Height:      Weight:      SpO2:  100% 100% 98%    Intake/Output Summary (Last 24 hours) at 05/10/12 1339 Last data filed at 05/10/12 1100  Gross per 24 hour  Intake   3285 ml  Output   3650 ml  Net   -365 ml   Filed Weights   05/07/12 0200  Weight: 55.8 kg (123 lb 0.3 oz)     Exam:   General:  No distress.   Cardiovascular: S 1, S 2 RRR  Respiratory: CTA  Abdomen: BS present, soft, NT, no rigidity.   Musculoskeletal: no edema  Data Reviewed: Basic Metabolic Panel:  Recent Labs Lab 05/06/12 1905 05/07/12 0638 05/08/12 0510 05/09/12 0300 05/10/12 0620  NA 131* 136 135 138 140  K 3.9 3.0* 3.6 3.2* 4.1  CL 89* 99 102 103 107  CO2 29 26 25 22 23   GLUCOSE 155* 142* 100* 128* 108*  BUN 8 7 6  5* 4*  CREATININE 0.83 0.82 0.73 0.68 0.70  CALCIUM 9.2 8.4 8.2* 8.2* 8.3*  MG  --   --  1.5  --   --    Liver Function Tests:  Recent Labs Lab 05/06/12 1905  AST 18  ALT 9  ALKPHOS 119*  BILITOT 0.4  PROT 8.0  ALBUMIN 3.2*    Recent Labs Lab 05/06/12 1905  LIPASE 8*   No results found for this basename: AMMONIA,  in the last 168 hours CBC:  Recent Labs Lab 05/06/12 1905 05/07/12 0638 05/08/12 0510 05/09/12 0300 05/10/12 0620  WBC 29.6* 30.1* 17.5* 12.0* 11.7*  NEUTROABS 25.7*  --   --   --   --   HGB 10.9* 10.3* 9.4* 10.9* 9.8*  HCT 30.1* 29.4* 27.1* 31.4* 28.4*  MCV 89.9 90.2 91.2 91.0 90.2  PLT 338 311 315 350 329   Cardiac Enzymes: No results found for this basename: CKTOTAL, CKMB, CKMBINDEX, TROPONINI,  in  the last 168 hours BNP (last 3 results) No results found for this basename: PROBNP,  in the last 8760 hours CBG: No results found for this basename: GLUCAP,  in the last 168 hours  Recent Results (from the past 240 hour(s))  CULTURE, BLOOD (ROUTINE X 2)     Status: None   Collection Time    05/06/12  7:00 PM      Result Value Range Status   Specimen Description BLOOD HAND LEFT   Final   Special Requests BOTTLES DRAWN AEROBIC ONLY 5CC   Final   Culture  Setup Time 05/07/2012 02:51   Final   Culture     Final   Value: ESCHERICHIA COLI     Note: Gram Stain Report Called to,Read Back By and Verified With: [facility] AT 0351 05/08/12 BY SNOLO   Report Status 05/10/2012 FINAL   Final   Organism ID, Bacteria ESCHERICHIA  COLI   Final  CULTURE, BLOOD (ROUTINE X 2)     Status: None   Collection Time    05/06/12  7:10 PM      Result Value Range Status   Specimen Description BLOOD LEFT ANTECUBITAL   Final   Special Requests BOTTLES DRAWN AEROBIC ONLY 5CC   Final   Culture  Setup Time 05/07/2012 02:52   Final   Culture     Final   Value: ESCHERICHIA COLI     Note: Gram Stain Report Called to,Read Back By and Verified With: LAUREN LEE 05/07/12 1400 BY SMITHERSJ   Report Status 05/09/2012 FINAL   Final   Organism ID, Bacteria ESCHERICHIA COLI   Final  URINE CULTURE     Status: None   Collection Time    05/06/12  7:25 PM      Result Value Range Status   Specimen Description URINE, CLEAN CATCH   Final   Special Requests NONE   Final   Culture  Setup Time 05/06/2012 20:00   Final   Colony Count >=100,000 COLONIES/ML   Final   Culture ESCHERICHIA COLI   Final   Report Status 05/08/2012 FINAL   Final   Organism ID, Bacteria ESCHERICHIA COLI   Final  CLOSTRIDIUM DIFFICILE BY PCR     Status: None   Collection Time    05/08/12  5:36 PM      Result Value Range Status   C difficile by pcr NEGATIVE  NEGATIVE Final  CULTURE, BLOOD (ROUTINE X 2)     Status: None   Collection Time    05/09/12  3:00 AM      Result Value Range Status   Specimen Description BLOOD LEFT HAND   Final   Special Requests BOTTLES DRAWN AEROBIC ONLY 10CC   Final   Culture  Setup Time 05/09/2012 12:43   Final   Culture     Final   Value:        BLOOD CULTURE RECEIVED NO GROWTH TO DATE CULTURE WILL BE HELD FOR 5 DAYS BEFORE ISSUING A FINAL NEGATIVE REPORT   Report Status PENDING   Incomplete  CULTURE, BLOOD (ROUTINE X 2)     Status: None   Collection Time    05/09/12  3:10 AM      Result Value Range Status   Specimen Description BLOOD RIGHT HAND   Final   Special Requests BOTTLES DRAWN AEROBIC ONLY 8CC   Final   Culture  Setup Time 05/09/2012 12:43   Final   Culture     Final  Value:        BLOOD CULTURE RECEIVED NO GROWTH TO DATE  CULTURE WILL BE HELD FOR 5 DAYS BEFORE ISSUING A FINAL NEGATIVE REPORT   Report Status PENDING   Incomplete     Studies: No results found.  Scheduled Meds: . atenolol  50 mg Oral Daily  . cefUROXime  500 mg Oral BID WC  . enoxaparin (LOVENOX) injection  40 mg Subcutaneous Q24H  . folic acid  1 mg Oral Daily  . lipase/protease/amylase  1 capsule Oral TID AC  . multivitamin with minerals  1 tablet Oral Daily  . phenytoin  300 mg Oral QHS  . thiamine  100 mg Oral Daily   Or  . thiamine  100 mg Intravenous Daily   Continuous Infusions:    Principal Problem:   Pyelonephritis Active Problems:   Viral URI with cough   Seizure disorder   Asthma with bronchitis   HTN (hypertension)    Time spent: 25 minutes.     Stella Encarnacion  [facility] Pager [phone]. If 7PM-7AM, please contact night-coverage at [email or web address], password [facility] 05/10/2012, 1:39 PM  LOS: 4 days

## 2012-05-11 LAB — BASIC METABOLIC PANEL
CO2: 28 mEq/L (ref 19–32)
Calcium: 8.9 mg/dL (ref 8.4–10.5)
Creatinine, Ser: 0.72 mg/dL (ref 0.50–1.10)
Glucose, Bld: 98 mg/dL (ref 70–99)

## 2012-05-11 MED ORDER — ADULT MULTIVITAMIN W/MINERALS CH: 1.0000 | ORAL_TABLET | Freq: Every day | ORAL | Status: DC

## 2012-05-11 MED ORDER — TRAMADOL HCL 50 MG PO TABS: 50.0000 mg | ORAL_TABLET | Freq: Three times a day (TID) | ORAL | Status: DC | PRN

## 2012-05-11 MED ORDER — CEFUROXIME AXETIL 500 MG PO TABS: 500.0000 mg | ORAL_TABLET | Freq: Two times a day (BID) | ORAL | Status: DC

## 2012-05-11 MED ORDER — PHENYTOIN SODIUM EXTENDED 100 MG PO CAPS: 300.0000 mg | ORAL_CAPSULE | Freq: Every day | ORAL | Status: DC

## 2012-05-11 MED ORDER — PANCRELIPASE (LIP-PROT-AMYL) 12000-38000 UNITS PO CPEP: 1.0000 | ORAL_CAPSULE | Freq: Three times a day (TID) | ORAL | Status: DC

## 2012-05-11 NOTE — Discharge Summary (Signed)
Physician Discharge Summary  Brandy Bishop ZOX:[id number] DOB: [birth year] DOA: 05/06/2012  PCP: Leda Min, MD  Admit date: 05/06/2012 Discharge date: 05/11/2012  Time spent: 35  minutes  Recommendations for Outpatient Follow-up:  1. Follow up with PCP for final results of blood culture.   Discharge Diagnoses:    Sepsis   E Coli Bacteremia.    Pyelonephritis   Seizure disorder   Asthma with bronchitis   HTN (hypertension)   Discharge Condition: Stable  Diet recommendation: General diet.   Filed Weights   05/07/12 0200  Weight: 55.8 kg (123 lb 0.3 oz)    History of present illness:  Brandy Bishop is an 52 y.o. female with history of seizure, though noncompliant with her medication and had not taken her Dilantin for quite a while, hypertension on beta blocker, chronic asthma on inhalers, nonsmoker, presents to the emergency room with three-day history of yellow sputum productive cough, dysuria, polyuria, diffuse myalgia, and right flank pain. She lives with her fianc and he is not ill. She had not taken her flu shot this year. She denied any distant travel or any ill contact. Evaluation in emergency room included a chest x-ray which did not show any infiltrate, a urinalysis strongly positive for UTI, leukocytosis with white count of 29,000, and normal renal function tests. Her liver function tests, lipase, and lactic acid were normal. An abdominal pelvic CT showed evidence of bilateral pyelonephritis without hydronephrosis, chronic pancreatitis, and inflammatory changes in the bladder. Hospitalist was asked to admit her for pyelonephritis. When she was first seen in emergency room she had hypotension with systolic of 80 mm Hg. Fortunately, she did respond to IV fluid promptly with a resultant systolic blood pressure of 110.   Hospital Course:  1. Early sepsis: patient presents with hypotension, tachycardia, fever, leukocytosis, pyelonephritis and has bacteremia. She was  started on IV fluids, Continue with antibiotics. BP stable. Resolved.  2. Pyelonephritis: Patient received IV Aztreonam and Levaquin, for 3 day. urine culture E coli. Sensitive to ciprofloxacin, ceftriaxone. . WBC trending down. Pain better. Day 6 antibiotics. Will transition to Ceftin.  3. Bacteremia, E coli. Pansensitive. Will change antibiotics to ceftin. Repeat Blood culture 5-5 no growth to date. Will treat for 2 more weeks. She will need to follow up with PCP to follow final result of blood culture.  4. Chronic Pancreatitis; Counseling provide regarding alcohol intake. Added pancreatic enzymes.  5. History Seizure: Continue with Phenytoin.  6. History alcohol use: Monitor on CIWA. Continue with Thiamine and folate. No evidence of DT.  7. Influenza negative.  8. Hypokalemia: resolved. K at 3.6.  9. Diarrhea: C diff negative, improved. Added pancreatic enzymes. Avoid lacteus.  10.   Procedures:  none  Consultations:  None  Discharge Exam: Filed Vitals:   05/10/12 1521 05/10/12 2100 05/11/12 0607 05/11/12 0933  BP: 126/83 148/80 122/73 107/59  Pulse: 79 84 84 82  Temp: 97.5 F (36.4 C) 97.9 F (36.6 C) 98.5 F (36.9 C)   TempSrc: Oral Oral Oral   Resp: 16 17 18    Height:      Weight:      SpO2: 100% 100% 98%     General: No distress. Cardiovascular: S 1, S 2 RRR Respiratory: CTA Abdomen; Soft, nt, nd  Discharge Instructions  Discharge Orders   Future Appointments Provider Department Dept Phone   05/20/2012 3:00 PM York Spaniel, MD [facility] [phone]   Future Orders Complete By Expires  Diet general  As directed     Increase activity slowly  As directed         Medication List    STOP taking these medications       hydrochlorothiazide 25 MG tablet  Commonly known as:  HYDRODIURIL     ibuprofen 200 MG tablet  Commonly known as:  ADVIL,MOTRIN      TAKE these medications       albuterol 108 (90 BASE) MCG/ACT inhaler   Commonly known as:  PROVENTIL HFA;VENTOLIN HFA  Inhale 2 puffs into the lungs every 6 (six) hours as needed for wheezing or shortness of breath.     atenolol 50 MG tablet  Commonly known as:  TENORMIN  Take 50 mg by mouth daily.     cefUROXime 500 MG tablet  Commonly known as:  CEFTIN  Take 1 tablet (500 mg total) by mouth 2 (two) times daily with a meal.     lipase/protease/amylase 40102 UNITS Cpep  Commonly known as:  CREON-10/PANCREASE  Take 1 capsule by mouth 3 (three) times daily before meals.     multivitamin with minerals Tabs  Take 1 tablet by mouth daily.     phenytoin 100 MG ER capsule  Commonly known as:  DILANTIN  Take 3 capsules (300 mg total) by mouth at bedtime.          The results of significant diagnostics from this hospitalization (including imaging, microbiology, ancillary and laboratory) are listed below for reference.    Significant Diagnostic Studies: Ct Abdomen Pelvis W Contrast  05/06/2012  *RADIOLOGY REPORT*  Clinical Data: 3-day history of generalized body aches, cough, fever, and abdominal pain.  CT ABDOMEN AND PELVIS WITH CONTRAST  Technique:  Multidetector CT imaging of the abdomen and pelvis was performed following the standard protocol during bolus administration of intravenous contrast.  Contrast: OMNIPAQUE IOHEXOL 300 MG/ML  SOLN  Comparison: CT chest abdomen and pelvis 11/29/2009  Findings: Mild dependent atelectasis in the lung bases.  Small esophageal hiatal hernia.  Normal heart size.  The liver, spleen, gallbladder, adrenal glands, abdominal aorta, and inferior vena cava are unremarkable.  The pancreas is atrophic and there are scattered calcifications in the pancreas consistent with chronic pancreatitis.  Mild prominence of retroperitoneal lymph nodes, likely reactive.  There is a heterogeneous appearance of the renal nephrograms bilaterally with focal areas of hypo enhancement in the zonal architecture.  Changes are most consistent with  bilateral pyelonephritis.  No evidence of hydronephrosis and no solid mass demonstrated in the kidneys.  There is mild stranding in the retroperitoneal fat around the right kidney which may be inflammatory.  The stomach and small bowel are decompressed.  Stool filled colon without distension.  Scattered colonic diverticula. No free air or free fluid in the abdomen.  Pelvis:  The appendix is normal.  The bladder wall is mildly thickened suggesting cystitis.  Uterus and adnexal structures are not enlarged.  No free pelvic fluid collections.  No loculated fluid collections.  No evidence of diverticulitis in the sigmoid colon.  Mild degenerative changes in the lumbar spine.  IMPRESSION: Heterogeneous appearance of the renal nephrograms bilaterally suggesting pyelonephritis.  Diffuse thickening of the bladder wall and retroperitoneal infiltration also consistent with inflammatory process.  Changes of chronic pancreatitis.  Small esophageal hiatal hernia.   Original Report Authenticated By: Burman Nieves, M.D.    Dg Chest Portable 1 View  05/06/2012  *RADIOLOGY REPORT*  Clinical Data: Generalized body aches.  Cough and abdominal pain.  PORTABLE CHEST - 1 VIEW  Comparison: Two-view chest 03/24/2012.  Findings: The heart size is normal.  The lungs are clear. Mild degenerative changes of the shoulders bilaterally are stable.  The visualized soft tissues and bony thorax are unremarkable.  IMPRESSION:  1.  Mild degenerative changes of the shoulders. 2.  No acute abnormality.   Original Report Authenticated By: Marin Roberts, M.D.     Microbiology: Recent Results (from the past 240 hour(s))  CULTURE, BLOOD (ROUTINE X 2)     Status: None   Collection Time    05/06/12  7:00 PM      Result Value Range Status   Specimen Description BLOOD HAND LEFT   Final   Special Requests BOTTLES DRAWN AEROBIC ONLY 5CC   Final   Culture  Setup Time 05/07/2012 02:51   Final   Culture     Final   Value: ESCHERICHIA COLI      Note: Gram Stain Report Called to,Read Back By and Verified With: [facility] AT 0351 05/08/12 BY SNOLO   Report Status 05/10/2012 FINAL   Final   Organism ID, Bacteria ESCHERICHIA COLI   Final  CULTURE, BLOOD (ROUTINE X 2)     Status: None   Collection Time    05/06/12  7:10 PM      Result Value Range Status   Specimen Description BLOOD LEFT ANTECUBITAL   Final   Special Requests BOTTLES DRAWN AEROBIC ONLY 5CC   Final   Culture  Setup Time 05/07/2012 02:52   Final   Culture     Final   Value: ESCHERICHIA COLI     Note: Gram Stain Report Called to,Read Back By and Verified With: LAUREN LEE 05/07/12 1400 BY SMITHERSJ   Report Status 05/09/2012 FINAL   Final   Organism ID, Bacteria ESCHERICHIA COLI   Final  URINE CULTURE     Status: None   Collection Time    05/06/12  7:25 PM      Result Value Range Status   Specimen Description URINE, CLEAN CATCH   Final   Special Requests NONE   Final   Culture  Setup Time 05/06/2012 20:00   Final   Colony Count >=100,000 COLONIES/ML   Final   Culture ESCHERICHIA COLI   Final   Report Status 05/08/2012 FINAL   Final   Organism ID, Bacteria ESCHERICHIA COLI   Final  CLOSTRIDIUM DIFFICILE BY PCR     Status: None   Collection Time    05/08/12  5:36 PM      Result Value Range Status   C difficile by pcr NEGATIVE  NEGATIVE Final  CULTURE, BLOOD (ROUTINE X 2)     Status: None   Collection Time    05/09/12  3:00 AM      Result Value Range Status   Specimen Description BLOOD LEFT HAND   Final   Special Requests BOTTLES DRAWN AEROBIC ONLY 10CC   Final   Culture  Setup Time 05/09/2012 12:43   Final   Culture     Final   Value:        BLOOD CULTURE RECEIVED NO GROWTH TO DATE CULTURE WILL BE HELD FOR 5 DAYS BEFORE ISSUING A FINAL NEGATIVE REPORT   Report Status PENDING   Incomplete  CULTURE, BLOOD (ROUTINE X 2)     Status: None   Collection Time    05/09/12  3:10 AM      Result Value Range Status   Specimen Description BLOOD RIGHT HAND  Final   Special  Requests BOTTLES DRAWN AEROBIC ONLY 8CC   Final   Culture  Setup Time 05/09/2012 12:43   Final   Culture     Final   Value:        BLOOD CULTURE RECEIVED NO GROWTH TO DATE CULTURE WILL BE HELD FOR 5 DAYS BEFORE ISSUING A FINAL NEGATIVE REPORT   Report Status PENDING   Incomplete     Labs: Basic Metabolic Panel:  Recent Labs Lab 05/07/12 0638 05/08/12 0510 05/09/12 0300 05/10/12 0620 05/11/12 0641  NA 136 135 138 140 140  K 3.0* 3.6 3.2* 4.1 4.2  CL 99 102 103 107 102  CO2 26 25 22 23 28   GLUCOSE 142* 100* 128* 108* 98  BUN 7 6 5* 4* 6  CREATININE 0.82 0.73 0.68 0.70 0.72  CALCIUM 8.4 8.2* 8.2* 8.3* 8.9  MG  --  1.5  --   --   --    Liver Function Tests:  Recent Labs Lab 05/06/12 1905  AST 18  ALT 9  ALKPHOS 119*  BILITOT 0.4  PROT 8.0  ALBUMIN 3.2*    Recent Labs Lab 05/06/12 1905  LIPASE 8*   No results found for this basename: AMMONIA,  in the last 168 hours CBC:  Recent Labs Lab 05/06/12 1905 05/07/12 0638 05/08/12 0510 05/09/12 0300 05/10/12 0620  WBC 29.6* 30.1* 17.5* 12.0* 11.7*  NEUTROABS 25.7*  --   --   --   --   HGB 10.9* 10.3* 9.4* 10.9* 9.8*  HCT 30.1* 29.4* 27.1* 31.4* 28.4*  MCV 89.9 90.2 91.2 91.0 90.2  PLT 338 311 315 350 329   Cardiac Enzymes: No results found for this basename: CKTOTAL, CKMB, CKMBINDEX, TROPONINI,  in the last 168 hours BNP: BNP (last 3 results) No results found for this basename: PROBNP,  in the last 8760 hours CBG: No results found for this basename: GLUCAP,  in the last 168 hours     Signed:  Paulyne Mooty  [facility] 05/11/2012, 10:52 AM

## 2012-05-11 NOTE — Progress Notes (Signed)
Patient discharged to home with family.  Discharged teaching done including follow up care, medications, diet and activity.  Verbalizes understanding with no further questions.  Encouraged patient to follow up with PCP after discharge as instructed.  Vital signs stable.  Discharged per wheelchair with family.

## 2012-05-11 NOTE — Progress Notes (Signed)
PT NOTE:  Received PT orders, reviewed chart, and spoke with patient and nurse.  Pt states she has been getting up in room by herself with no problem.  Pt denies any balance problems or dizziness.  Pt states her fiance can help her at home. PT signed off.  Please re-order PT if warranted in the future. Thank you.  Clydie Braun L. Katrinka Blazing, [place] Pager [phone] 05/11/2012

## 2012-05-11 NOTE — Progress Notes (Signed)
MEDICATION RELATED CONSULT NOTE - FOLLOW UP   Pharmacy Consult:  Dilantin Indication:  History of seizure  Allergies  Allergen Reactions  . Penicillins Swelling    Patient Measurements: Height: 5\' 3"  (160 cm) Weight: 123 lb 0.3 oz (55.8 kg) IBW/kg (Calculated) : 52.4  Vital Signs: Temp: 98.5 F (36.9 C) (04/07 0607) Temp src: Oral (04/07 0607) BP: 107/59 mmHg (04/07 0933) Pulse Rate: 82 (04/07 0933) Intake/Output from previous day: 04/06 0701 - 04/07 0700 In: 750 [P.O.:750] Out: 2600 [Urine:2600] Intake/Output from this shift: Total I/O In: 240 [P.O.:240] Out: -   Labs:  Recent Labs  05/09/12 0300 05/10/12 0620 05/11/12 0641  WBC 12.0* 11.7*  --   HGB 10.9* 9.8*  --   HCT 31.4* 28.4*  --   PLT 350 329  --   CREATININE 0.68 0.70 0.72   Estimated Creatinine Clearance: 68.8 ml/min (by C-G formula based on Cr of 0.72).   Microbiology: Recent Results (from the past 720 hour(s))  CULTURE, BLOOD (ROUTINE X 2)     Status: None   Collection Time    05/06/12  7:00 PM      Result Value Range Status   Specimen Description BLOOD HAND LEFT   Final   Special Requests BOTTLES DRAWN AEROBIC ONLY 5CC   Final   Culture  Setup Time 05/07/2012 02:51   Final   Culture     Final   Value: ESCHERICHIA COLI     Note: Gram Stain Report Called to,Read Back By and Verified With: [facility] AT 0351 05/08/12 BY SNOLO   Report Status 05/10/2012 FINAL   Final   Organism ID, Bacteria ESCHERICHIA COLI   Final  CULTURE, BLOOD (ROUTINE X 2)     Status: None   Collection Time    05/06/12  7:10 PM      Result Value Range Status   Specimen Description BLOOD LEFT ANTECUBITAL   Final   Special Requests BOTTLES DRAWN AEROBIC ONLY 5CC   Final   Culture  Setup Time 05/07/2012 02:52   Final   Culture     Final   Value: ESCHERICHIA COLI     Note: Gram Stain Report Called to,Read Back By and Verified With: LAUREN LEE 05/07/12 1400 BY SMITHERSJ   Report Status 05/09/2012 FINAL   Final   Organism ID,  Bacteria ESCHERICHIA COLI   Final  URINE CULTURE     Status: None   Collection Time    05/06/12  7:25 PM      Result Value Range Status   Specimen Description URINE, CLEAN CATCH   Final   Special Requests NONE   Final   Culture  Setup Time 05/06/2012 20:00   Final   Colony Count >=100,000 COLONIES/ML   Final   Culture ESCHERICHIA COLI   Final   Report Status 05/08/2012 FINAL   Final   Organism ID, Bacteria ESCHERICHIA COLI   Final  CLOSTRIDIUM DIFFICILE BY PCR     Status: None   Collection Time    05/08/12  5:36 PM      Result Value Range Status   C difficile by pcr NEGATIVE  NEGATIVE Final  CULTURE, BLOOD (ROUTINE X 2)     Status: None   Collection Time    05/09/12  3:00 AM      Result Value Range Status   Specimen Description BLOOD LEFT HAND   Final   Special Requests BOTTLES DRAWN AEROBIC ONLY 10CC   Final   Culture  Setup Time 05/09/2012 12:43   Final   Culture     Final   Value:        BLOOD CULTURE RECEIVED NO GROWTH TO DATE CULTURE WILL BE HELD FOR 5 DAYS BEFORE ISSUING A FINAL NEGATIVE REPORT   Report Status PENDING   Incomplete  CULTURE, BLOOD (ROUTINE X 2)     Status: None   Collection Time    05/09/12  3:10 AM      Result Value Range Status   Specimen Description BLOOD RIGHT HAND   Final   Special Requests BOTTLES DRAWN AEROBIC ONLY 8CC   Final   Culture  Setup Time 05/09/2012 12:43   Final   Culture     Final   Value:        BLOOD CULTURE RECEIVED NO GROWTH TO DATE CULTURE WILL BE HELD FOR 5 DAYS BEFORE ISSUING A FINAL NEGATIVE REPORT   Report Status PENDING   Incomplete         Assessment: [name] with history of seizure on Dilantin PTA but is noncompliant.  Patient was reloaded on 05/07/12 and continues on 300mg  PO QHS.  Previous level acceptable at 11.8 mcg/mL although it was obtained early.  No new seizure reported and her renal function remains stable.     Goal of Therapy:  Corrected DPH level: 10 - 20 mcg/mL   Plan:  - Continue DPH 300mg  PO QHS -  Monitor for seizure activity, renal fxn, DPH level when indicated - Continue on Ceftin for bacteremia, f/u LOT      Brandy Bishop, PharmD, BCPS Pager:  [phone] 05/11/2012, 10:00 AM

## 2012-05-15 LAB — CULTURE, BLOOD (ROUTINE X 2): Culture: NO GROWTH

## 2012-05-20 ENCOUNTER — Ambulatory Visit (INDEPENDENT_AMBULATORY_CARE_PROVIDER_SITE_OTHER): Payer: Medicaid Other | Admitting: Neurology

## 2012-05-20 ENCOUNTER — Encounter: Payer: Self-pay | Admitting: Neurology

## 2012-05-20 VITALS — BP 121/74 | HR 65 | Ht 63.0 in | Wt 124.0 lb

## 2012-05-20 DIAGNOSIS — Z5181 Encounter for therapeutic drug level monitoring: Secondary | ICD-10-CM

## 2012-05-20 DIAGNOSIS — G40909 Epilepsy, unspecified, not intractable, without status epilepticus: Secondary | ICD-10-CM

## 2012-05-20 NOTE — Progress Notes (Signed)
Reason for visit: Seizures  Brandy Bishop is a 52 y.o. female  History of present illness:  Ms. Quarry is a 52 year old right-handed black female with a history of seizures that she claims dates back to around age 79 or 65. The patient has been on medications off and on since that time, but it appears that she has not really been taking her medications on a regular basis. The patient was recently in the hospital with pyelonephritis, and she was placed back on her Dilantin dosing at 300 mg at night. The patient indicates that as long she takes her medications, she does not have seizures. The patient indicates that her last seizure was approximately 2 months ago. The patient may have a warning of the seizure with some dizziness, and then she will have a generalized tonic-clonic episode with tongue biting. The patient does not lose control of the bowels or the bladder. The patient does not operate a motor vehicle. The patient reports no focal numbness or weakness of the face, arms, or legs. 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 The patient comes to this office for further evaluation. The patient has a history of alcohol abuse in the past.  Past Medical History  Diagnosis Date  . Seizures   . Asthma   . Hypertension     History reviewed. No pertinent past surgical history.  Family History  Problem Relation Age of Onset  . Cancer Mother   . Cancer Brother     Social history:  reports that she has never smoked. She does not have any smokeless tobacco history on file. She reports that  drinks alcohol. She reports that she does not use illicit drugs.  Medications:  Current Outpatient Prescriptions on File Prior to Visit  Medication Sig Dispense Refill  . albuterol (PROVENTIL HFA;VENTOLIN HFA) 108 (90 BASE) MCG/ACT inhaler Inhale 2 puffs into the lungs every 6 (six) hours as needed for wheezing or shortness of breath.        Marland Kitchen atenolol (TENORMIN) 50 MG tablet Take 50 mg by mouth daily.      . cefUROXime (CEFTIN) 500 MG tablet Take 1 tablet (500 mg total) by mouth 2 (two) times daily with a meal.  28 tablet  0  . lipase/protease/amylase (CREON-10/PANCREASE) 12000 UNITS CPEP Take 1 capsule by mouth 3 (three) times daily before meals.  270 capsule  0  . Multiple Vitamin (MULTIVITAMIN WITH MINERALS) TABS Take 1 tablet by mouth daily.  30 tablet  0  . phenytoin (DILANTIN) 100 MG ER capsule Take 3 capsules (300 mg total) by mouth at bedtime.  30 capsule  0  . traMADol (ULTRAM) 50 MG tablet Take 1 tablet (50 mg total) by mouth every 8 (eight) hours as needed.  30 tablet  0   No current facility-administered medications on file prior to visit.    Allergies:  Allergies  Allergen Reactions  . Penicillins Swelling    ROS:  Out of a complete 14 system review of symptoms, the patient complains only of the following symptoms, and all other reviewed systems are negative.  Blurred vision Chest pains Itching Cough Achy muscles Seizures Sleepiness  Blood pressure 121/74, pulse 65, height 5\' 3"  (1.6 m), weight 124 lb (56.246 kg).  Physical Exam  General: The patient is alert and cooperative at the time of the examination.  Head: Pupils are equal, round, and reactive to light. Discs are  flat bilaterally.  Neck: The neck is supple, no carotid bruits are noted.  Respiratory: The respiratory examination is clear.  Cardiovascular: The cardiovascular examination reveals a regular rate and rhythm, no obvious murmurs or rubs are noted.  Skin: Extremities are without significant edema.  Neurologic Exam  Mental status:  Cranial nerves: Facial symmetry is present. There is good sensation of the face to pinprick and soft touch bilaterally. The strength of the facial muscles and the muscles to head turning and shoulder shrug are normal bilaterally. Speech is well enunciated, no aphasia or dysarthria is noted.  Extraocular movements are full. Visual fields are full.  Motor: The motor testing reveals 5 over 5 strength of all 4 extremities. Good symmetric motor tone is noted throughout.  Sensory: Sensory testing is intact to pinprick, soft touch, vibration sensation, and position sense on all 4 extremities. No evidence of extinction is noted.  Coordination: Cerebellar testing reveals good finger-nose-finger and heel-to-shin bilaterally.  Gait and station: Gait is normal. Tandem gait is normal. Romberg is negative. No drift is seen.  Reflexes: Deep tendon reflexes are symmetric, but are depressed bilaterally. Toes are downgoing bilaterally.   Assessment/Plan:  1. History of seizures  The patient will continue her Dilantin for now. The patient will have blood work done today. The patient fortunately does not operate a motor vehicle. The patient will followup in 6 months. The patient will contact our office if she needs another prescription for her medication.  Marlan Palau MD 05/20/2012 7:49 PM  [facility] [address] [place], [place] [address]  Phone [phone] Fax [phone]

## 2012-05-21 ENCOUNTER — Telehealth: Payer: Self-pay | Admitting: Neurology

## 2012-05-21 LAB — COMPREHENSIVE METABOLIC PANEL
ALT: 7 IU/L (ref 0–32)
AST: 10 IU/L (ref 0–40)
Albumin/Globulin Ratio: 1.2 (ref 1.1–2.5)
BUN/Creatinine Ratio: 28 — ABNORMAL HIGH (ref 9–23)
GFR calc Af Amer: 119 mL/min/{1.73_m2} (ref >59)
GFR calc non Af Amer: 103 mL/min/{1.73_m2} (ref >59)
Potassium: 4.4 mmol/L (ref 3.5–5.2)
Sodium: 139 mmol/L (ref 134–144)
Total Bilirubin: 0.1 mg/dL (ref 0.0–1.2)

## 2012-05-21 LAB — CBC WITH DIFFERENTIAL/PLATELET
Basophils Absolute: 0.1 10*3/uL (ref 0.0–0.2)
HCT: 31.9 % — ABNORMAL LOW (ref 34.0–46.6)
Immature Granulocytes: 0 % (ref 0–2)
Lymphocytes Absolute: 2.9 10*3/uL (ref 0.7–3.1)
Lymphs: 53 % — ABNORMAL HIGH (ref 14–46)
MCHC: 31.7 g/dL (ref 31.5–35.7)
Monocytes: 8 % (ref 4–12)
RDW: 14.9 % (ref 12.3–15.4)

## 2012-05-21 NOTE — Telephone Encounter (Signed)
I called patient. The blood work reveals a low Dilantin level IV.6. For now, we will watch the patient, but if she has further seizures, she is to contact me and we will go up on the dose. In the past, the patient has been completely noncompliant. The patient has a mild anemia, liver enzymes are unremarkable.

## 2012-06-26 ENCOUNTER — Emergency Department (HOSPITAL_COMMUNITY)
Admission: EM | Admit: 2012-06-26 | Discharge: 2012-06-26 | Disposition: A | Discharge status: Home / Self Care | Payer: Medicaid Other | Attending: Emergency Medicine | Admitting: Emergency Medicine

## 2012-06-26 ENCOUNTER — Encounter (HOSPITAL_COMMUNITY): Payer: Self-pay | Admitting: *Deleted

## 2012-06-26 DIAGNOSIS — J45909 Unspecified asthma, uncomplicated: Secondary | ICD-10-CM | POA: Insufficient documentation

## 2012-06-26 DIAGNOSIS — Z79899 Other long term (current) drug therapy: Secondary | ICD-10-CM | POA: Insufficient documentation

## 2012-06-26 DIAGNOSIS — M549 Dorsalgia, unspecified: Secondary | ICD-10-CM | POA: Insufficient documentation

## 2012-06-26 DIAGNOSIS — F1010 Alcohol abuse, uncomplicated: Secondary | ICD-10-CM | POA: Insufficient documentation

## 2012-06-26 DIAGNOSIS — R109 Unspecified abdominal pain: Secondary | ICD-10-CM | POA: Insufficient documentation

## 2012-06-26 DIAGNOSIS — F10929 Alcohol use, unspecified with intoxication, unspecified: Secondary | ICD-10-CM

## 2012-06-26 DIAGNOSIS — G40909 Epilepsy, unspecified, not intractable, without status epilepticus: Secondary | ICD-10-CM | POA: Insufficient documentation

## 2012-06-26 DIAGNOSIS — Z5181 Encounter for therapeutic drug level monitoring: Secondary | ICD-10-CM

## 2012-06-26 DIAGNOSIS — Z8719 Personal history of other diseases of the digestive system: Secondary | ICD-10-CM | POA: Insufficient documentation

## 2012-06-26 DIAGNOSIS — I10 Essential (primary) hypertension: Secondary | ICD-10-CM | POA: Insufficient documentation

## 2012-06-26 LAB — URINALYSIS, ROUTINE W REFLEX MICROSCOPIC
Glucose, UA: NEGATIVE mg/dL
Specific Gravity, Urine: 1.01 (ref 1.005–1.030)

## 2012-06-26 LAB — COMPREHENSIVE METABOLIC PANEL
AST: 11 U/L (ref 0–37)
BUN: 17 mg/dL (ref 6–23)
CO2: 23 mEq/L (ref 19–32)
Calcium: 8.9 mg/dL (ref 8.4–10.5)
Creatinine, Ser: 0.74 mg/dL (ref 0.50–1.10)
GFR calc Af Amer: >90 mL/min (ref >90)
GFR calc non Af Amer: >90 mL/min (ref >90)
Glucose, Bld: 84 mg/dL (ref 70–99)

## 2012-06-26 LAB — LIPASE, BLOOD: Lipase: 11 U/L (ref 11–59)

## 2012-06-26 LAB — CBC WITH DIFFERENTIAL/PLATELET
Hemoglobin: 10.9 g/dL — ABNORMAL LOW (ref 12.0–15.0)
Lymphocytes Relative: 44 % (ref 12–46)
Lymphs Abs: 3.7 10*3/uL (ref 0.7–4.0)
Monocytes Relative: 6 % (ref 3–12)
Neutro Abs: 4.1 10*3/uL (ref 1.7–7.7)
Neutrophils Relative %: 48 % (ref 43–77)
RBC: 3.48 MIL/uL — ABNORMAL LOW (ref 3.87–5.11)
WBC: 8.4 10*3/uL (ref 4.0–10.5)

## 2012-06-26 LAB — RAPID URINE DRUG SCREEN, HOSP PERFORMED
Amphetamines: NOT DETECTED
Tetrahydrocannabinol: NOT DETECTED

## 2012-06-26 LAB — PHENYTOIN LEVEL, TOTAL: Phenytoin Lvl: <2.5 ug/mL — ABNORMAL LOW (ref 10.0–20.0)

## 2012-06-26 LAB — URINE MICROSCOPIC-ADD ON

## 2012-06-26 MED ORDER — PHENYTOIN SODIUM EXTENDED 100 MG PO CAPS: 300.0000 mg | ORAL_CAPSULE | Freq: Every day | ORAL | Status: DC

## 2012-06-26 MED ORDER — IBUPROFEN 800 MG PO TABS
800.0000 mg | ORAL_TABLET | Freq: Once | ORAL | Status: AC
  Administered 2012-06-26: 800 mg via ORAL
  Filled 2012-06-26: qty 1

## 2012-06-26 MED ORDER — SODIUM CHLORIDE 0.9 % IV SOLN
1000.0000 mg | Freq: Once | INTRAVENOUS | Status: AC
  Administered 2012-06-26: 1000 mg via INTRAVENOUS
  Filled 2012-06-26: qty 20

## 2012-06-26 MED ORDER — SODIUM CHLORIDE 0.9 % IV BOLUS (SEPSIS)
1000.0000 mL | Freq: Once | INTRAVENOUS | Status: AC
  Administered 2012-06-26: 1000 mL via INTRAVENOUS

## 2012-06-26 MED ORDER — PANTOPRAZOLE SODIUM 40 MG IV SOLR
40.0000 mg | Freq: Once | INTRAVENOUS | Status: AC
  Administered 2012-06-26: 40 mg via INTRAVENOUS
  Filled 2012-06-26: qty 40

## 2012-06-26 NOTE — ED Notes (Addendum)
Patient is alert to voice. She responds inappropriately to verbal commands She is complaining of abdominal pain.  She verifies that she has been drinking and  Is concerned that she damaged her liver and wanted to get checked out.

## 2012-06-26 NOTE — ED Notes (Signed)
Pt sleeping. Respirations even and unlabored. Bilateral rise and fall of chest. Skin warm and dry. In no acute distress.   

## 2012-06-26 NOTE — ED Provider Notes (Addendum)
History     CSN: [id number]  Arrival date & time 06/26/12  0106   First MD Initiated Contact with Patient 06/26/12 0142      Chief Complaint  Patient presents with  . Abdominal Pain    (Consider location/radiation/quality/duration/timing/severity/associated sxs/prior treatment) HPI Level 5 Caveat: intoxicated; rambling, incoherent speech. This is a 52 year old female with a history of pancreatitis who admits she started drinking again recently. She admits to "one or two beers". She is complaining variously of abdominal pain, back pain, side pain, upper abdominal pain or lower abdominal pain depending on which time you ask her. Nursing staff reports similarly incoherent, rambling answers to questions. It is unclear if she's had any nausea, vomiting or diarrhea.    Past Medical History  Diagnosis Date  . Seizures   . Asthma   . Hypertension     History reviewed. No pertinent past surgical history.  Family History  Problem Relation Age of Onset  . Cancer Mother   . Cancer Brother     History  Substance Use Topics  . Smoking status: Never Smoker   . Smokeless tobacco: Not on file  . Alcohol Use: Yes     Comment: drinks beer occasionally    OB History   Grav Para Term Preterm Abortions TAB SAB Ect Mult Living                  Review of Systems  Unable to perform ROS   Allergies  Penicillins  Home Medications   Current Outpatient Rx  Name  Route  Sig  Dispense  Refill  . albuterol (PROVENTIL HFA;VENTOLIN HFA) 108 (90 BASE) MCG/ACT inhaler   Inhalation   Inhale 2 puffs into the lungs every 6 (six) hours as needed for wheezing or shortness of breath.          Marland Kitchen atenolol (TENORMIN) 50 MG tablet   Oral   Take 50 mg by mouth daily.         . lipase/protease/amylase (CREON-10/PANCREASE) 12000 UNITS CPEP   Oral   Take 1 capsule by mouth 3 (three) times daily before meals.   270 capsule   0   . Multiple Vitamin (MULTIVITAMIN WITH MINERALS) TABS    Oral   Take 1 tablet by mouth daily.   30 tablet   0   . phenytoin (DILANTIN) 100 MG ER capsule   Oral   Take 3 capsules (300 mg total) by mouth at bedtime.   30 capsule   0   . traMADol (ULTRAM) 50 MG tablet   Oral   Take 1 tablet (50 mg total) by mouth every 8 (eight) hours as needed.   30 tablet   0     BP 116/78  Pulse 72  Temp(Src) 97.6 F (36.4 C) (Oral)  Resp 16  SpO2 100%  Physical Exam General: Well-developed, well-nourished female in no acute distress; appearance consistent with age of record HENT: normocephalic, atraumatic; breath smells of alcohol Eyes: pupils equal round and reactive to light; extraocular muscles intact; nystagmus Neck: supple Heart: regular rate and rhythm Lungs: clear to auscultation bilaterally Abdomen: soft; nondistended; epigastric and right upper quadrant tenderness; no masses or hepatosplenomegaly; bowel sounds present Extremities: No deformity; full range of motion Neurologic: Awake but lethargic; motor function intact in all extremities and symmetric; no facial droop; ataxia; dysarthria Skin: Warm and dry Psychiatric: Intoxicated; rambling, inappropriate answers to questions    ED Course  Procedures (including critical care time)    MDM  Nursing notes and vitals signs, including pulse oximetry, reviewed.  Summary of this visit's results, reviewed by myself:  Labs:  Results for orders placed during the hospital encounter of 06/26/12 (from the past 24 hour(s))  URINALYSIS, ROUTINE W REFLEX MICROSCOPIC     Status: Abnormal   Collection Time    06/26/12  1:34 AM      Result Value Range   Color, Urine YELLOW  YELLOW   APPearance CLEAR  CLEAR   Specific Gravity, Urine 1.010  1.005 - 1.030   pH 5.0  5.0 - 8.0   Glucose, UA NEGATIVE  NEGATIVE mg/dL   Hgb urine dipstick TRACE (*) NEGATIVE   Bilirubin Urine NEGATIVE  NEGATIVE   Ketones, ur NEGATIVE  NEGATIVE mg/dL   Protein, ur NEGATIVE  NEGATIVE mg/dL   Urobilinogen, UA  0.2  0.0 - 1.0 mg/dL   Nitrite NEGATIVE  NEGATIVE   Leukocytes, UA MODERATE (*) NEGATIVE  URINE RAPID DRUG SCREEN (HOSP PERFORMED)     Status: None   Collection Time    06/26/12  1:34 AM      Result Value Range   Opiates NONE DETECTED  NONE DETECTED   Cocaine NONE DETECTED  NONE DETECTED   Benzodiazepines NONE DETECTED  NONE DETECTED   Amphetamines NONE DETECTED  NONE DETECTED   Tetrahydrocannabinol NONE DETECTED  NONE DETECTED   Barbiturates NONE DETECTED  NONE DETECTED  URINE MICROSCOPIC-ADD ON     Status: None   Collection Time    06/26/12  1:34 AM      Result Value Range   Squamous Epithelial / LPF RARE  RARE   WBC, UA 3-6  <3 WBC/hpf   RBC / HPF 0-2  <3 RBC/hpf   Bacteria, UA RARE  RARE  ETHANOL     Status: Abnormal   Collection Time    06/26/12  2:00 AM      Result Value Range   Alcohol, Ethyl (B) 292 (*) 0 - 11 mg/dL  LIPASE, BLOOD     Status: None   Collection Time    06/26/12  2:00 AM      Result Value Range   Lipase 11  11 - 59 U/L  COMPREHENSIVE METABOLIC PANEL     Status: Abnormal   Collection Time    06/26/12  2:00 AM      Result Value Range   Sodium 137  135 - 145 mEq/L   Potassium 3.8  3.5 - 5.1 mEq/L   Chloride 101  96 - 112 mEq/L   CO2 23  19 - 32 mEq/L   Glucose, Bld 84  70 - 99 mg/dL   BUN 17  6 - 23 mg/dL   Creatinine, Ser 4.54  0.50 - 1.10 mg/dL   Calcium 8.9  8.4 - 09.8 mg/dL   Total Protein 7.3  6.0 - 8.3 g/dL   Albumin 3.5  3.5 - 5.2 g/dL   AST 11  0 - 37 U/L   ALT 7  0 - 35 U/L   Alkaline Phosphatase 72  39 - 117 U/L   Total Bilirubin 0.1 (*) 0.3 - 1.2 mg/dL   GFR calc non Af Amer >90  >90 mL/min   GFR calc Af Amer >90  >90 mL/min  CBC WITH DIFFERENTIAL     Status: Abnormal   Collection Time    06/26/12  2:00 AM      Result Value Range   WBC 8.4  4.0 - 10.5 K/uL   RBC  3.48 (*) 3.87 - 5.11 MIL/uL   Hemoglobin 10.9 (*) 12.0 - 15.0 g/dL   HCT 40.9 (*) 81.1 - 91.4 %   MCV 90.5  78.0 - 100.0 fL   MCH 31.3  26.0 - 34.0 pg   MCHC 34.6   30.0 - 36.0 g/dL   RDW 78.2  95.6 - 21.3 %   Platelets 189  150 - 400 K/uL   Neutrophils Relative % 48  43 - 77 %   Neutro Abs 4.1  1.7 - 7.7 K/uL   Lymphocytes Relative 44  12 - 46 %   Lymphs Abs 3.7  0.7 - 4.0 K/uL   Monocytes Relative 6  3 - 12 %   Monocytes Absolute 0.5  0.1 - 1.0 K/uL   Eosinophils Relative 1  0 - 5 %   Eosinophils Absolute 0.1  0.0 - 0.7 K/uL   Basophils Relative 1  0 - 1 %   Basophils Absolute 0.0  0.0 - 0.1 K/uL  PHENYTOIN LEVEL, TOTAL     Status: Abnormal   Collection Time    06/26/12  2:00 AM      Result Value Range   Phenytoin Lvl <2.5 (*) 10.0 - 20.0 ug/mL   7:22 AM Patient given a Dilantin load of 1 g IV. We will refill her Dilantin prescription. She has been awake and ambulatory to the bathroom. She still has slurred speech. We will discharge at sobriety.        Hanley Seamen, MD 06/26/12 0865  Hanley Seamen, MD 06/26/12 7846

## 2012-06-26 NOTE — ED Provider Notes (Signed)
Pt awake/alert now, speech clear, NAD. Has ambulated around the ED several times with steady gait, easy resps. Has tol PO well without N/V.  Wants to go home now. Has already received dose of dilantin while in the ED; will also receive rx. Workup reassuring. Dx and testing d/w pt.  Questions answered.  Verb understanding, agreeable to d/c home with outpt f/u.   Laray Anger, DO 06/26/12 [phone]

## 2012-07-06 ENCOUNTER — Encounter (HOSPITAL_COMMUNITY): Payer: Self-pay | Admitting: Emergency Medicine

## 2012-07-06 ENCOUNTER — Emergency Department (HOSPITAL_COMMUNITY)
Admission: EM | Admit: 2012-07-06 | Discharge: 2012-07-06 | Disposition: A | Discharge status: Home / Self Care | Payer: Medicaid Other | Attending: Emergency Medicine | Admitting: Emergency Medicine

## 2012-07-06 DIAGNOSIS — R6889 Other general symptoms and signs: Secondary | ICD-10-CM | POA: Insufficient documentation

## 2012-07-06 DIAGNOSIS — Z79899 Other long term (current) drug therapy: Secondary | ICD-10-CM | POA: Insufficient documentation

## 2012-07-06 DIAGNOSIS — J3489 Other specified disorders of nose and nasal sinuses: Secondary | ICD-10-CM | POA: Insufficient documentation

## 2012-07-06 DIAGNOSIS — R5383 Other fatigue: Secondary | ICD-10-CM | POA: Insufficient documentation

## 2012-07-06 DIAGNOSIS — J45901 Unspecified asthma with (acute) exacerbation: Secondary | ICD-10-CM | POA: Insufficient documentation

## 2012-07-06 DIAGNOSIS — L299 Pruritus, unspecified: Secondary | ICD-10-CM | POA: Insufficient documentation

## 2012-07-06 DIAGNOSIS — R5381 Other malaise: Secondary | ICD-10-CM | POA: Insufficient documentation

## 2012-07-06 DIAGNOSIS — R0789 Other chest pain: Secondary | ICD-10-CM | POA: Insufficient documentation

## 2012-07-06 DIAGNOSIS — I10 Essential (primary) hypertension: Secondary | ICD-10-CM | POA: Insufficient documentation

## 2012-07-06 DIAGNOSIS — R569 Unspecified convulsions: Secondary | ICD-10-CM | POA: Insufficient documentation

## 2012-07-06 DIAGNOSIS — J069 Acute upper respiratory infection, unspecified: Secondary | ICD-10-CM | POA: Insufficient documentation

## 2012-07-06 DIAGNOSIS — Z880 Allergy status to penicillin: Secondary | ICD-10-CM | POA: Insufficient documentation

## 2012-07-06 DIAGNOSIS — R0982 Postnasal drip: Secondary | ICD-10-CM | POA: Insufficient documentation

## 2012-07-06 DIAGNOSIS — J45998 Other asthma: Secondary | ICD-10-CM

## 2012-07-06 MED ORDER — GUAIFENESIN ER 600 MG PO TB12: 1200.0000 mg | ORAL_TABLET | Freq: Two times a day (BID) | ORAL | Status: DC

## 2012-07-06 MED ORDER — BECLOMETHASONE DIPROPIONATE 40 MCG/ACT IN AERS: 2.0000 | INHALATION_SPRAY | Freq: Two times a day (BID) | RESPIRATORY_TRACT | Status: DC

## 2012-07-06 MED ORDER — ALBUTEROL SULFATE HFA 108 (90 BASE) MCG/ACT IN AERS: 2.0000 | INHALATION_SPRAY | Freq: Once | RESPIRATORY_TRACT | Status: DC

## 2012-07-06 MED ORDER — DEXAMETHASONE SODIUM PHOSPHATE 4 MG/ML IJ SOLN
10.0000 mg | Freq: Once | INTRAMUSCULAR | Status: AC
  Administered 2012-07-06: 10 mg via INTRAMUSCULAR

## 2012-07-06 MED ORDER — DEXAMETHASONE SODIUM PHOSPHATE 10 MG/ML IJ SOLN
10.0000 mg | Freq: Once | INTRAMUSCULAR | Status: DC
  Filled 2012-07-06: qty 1

## 2012-07-06 MED ORDER — ALBUTEROL SULFATE HFA 108 (90 BASE) MCG/ACT IN AERS
2.0000 | INHALATION_SPRAY | Freq: Once | RESPIRATORY_TRACT | Status: AC
  Administered 2012-07-06: 2 via RESPIRATORY_TRACT
  Filled 2012-07-06: qty 6.7

## 2012-07-06 MED ORDER — DEXAMETHASONE SODIUM PHOSPHATE 4 MG/ML IJ SOLN: 8.0000 mg | Freq: Once | INTRAMUSCULAR | Status: DC

## 2012-07-06 NOTE — ED Provider Notes (Signed)
History     CSN: [id number] Arrival date & time 07/06/12  4098 First MD Initiated Contact with Patient 07/06/12 0914    Chief Complaint  Patient presents with  . Cough   HPI 1 week of cough. Productive yellow phlegm. Increased wheeze from baseline. Slightly increased work of breathing but minimal and mainly at night.  Now using albuterol 2-4x per night and occasionally during the day. Also with symptoms of congestion, runny nose. Gradually improving but just not resolved yet.  No fever/chills/nausea/vomiting. Person she lives with is in ED right now for similar symptoms. At home taking dayquil and ibuprofen.   At baseline, uses albuterol everynight due to coughing. Former [facility] patient and states that she is starting back with [facility] and she was scheduled to see them today to set up an appointment.   Past Medical History  Diagnosis Date  . Seizures   . Asthma   . Hypertension    Surgical history-none  Family History  Problem Relation Age of Onset  . Cancer Mother   . Cancer Brother     History  Substance Use Topics  . Smoking status: Never Smoker   . Smokeless tobacco: Not on file  . Alcohol Use: Yes     Comment: drinks beer occasionally Last Drink 07/03/2012    OB History   Grav Para Term Preterm Abortions TAB SAB Ect Mult Living                  Review of Systems  Constitutional: Positive for activity change and fatigue. Negative for fever, chills and diaphoresis.  HENT: Positive for congestion, rhinorrhea, sneezing and postnasal drip. Negative for neck pain and neck stiffness.   Eyes: Positive for itching. Negative for visual disturbance.  Respiratory: Positive for cough, chest tightness, shortness of breath and wheezing. Negative for choking.   Cardiovascular: Positive for chest pain (only with hard coughs).  Gastrointestinal: Negative.   Endocrine: Negative.   Genitourinary: Negative.   Musculoskeletal: Negative.   Skin: Negative.    Neurological: Negative.   Hematological: Negative.     Allergies  Penicillins  Home Medications   Current Outpatient Rx  Name  Route  Sig  Dispense  Refill  . atenolol (TENORMIN) 50 MG tablet   Oral   Take 50 mg by mouth daily.         . lipase/protease/amylase (CREON-10/PANCREASE) 12000 UNITS CPEP   Oral   Take 1 capsule by mouth 3 (three) times daily before meals.   270 capsule   0   . Multiple Vitamin (MULTIVITAMIN WITH MINERALS) TABS   Oral   Take 1 tablet by mouth daily.   30 tablet   0   . phenytoin (DILANTIN) 100 MG ER capsule   Oral   Take 3 capsules (300 mg total) by mouth at bedtime.   90 capsule   0   . Pseudoephedrine-APAP-DM (DAYQUIL PO)   Oral   Take 1 tablet by mouth 2 (two) times daily as needed (for cold).           BP 134/89  Pulse 83  Temp(Src) 98.4 F (36.9 C)  Resp 20  SpO2 99%  Physical Exam  Constitutional: She is oriented to person, place, and time. No distress.  Thin, NAD, resting in bed with occasional cough  HENT:  Head: Normocephalic and atraumatic.  Mouth/Throat: Oropharynx is clear and moist.  Eyes: EOM are normal. Pupils are equal, round, and reactive to light.  Neck:  Normal range of motion. Neck supple.  Cardiovascular: Normal rate and regular rhythm.  Exam reveals no gallop and no friction rub.   No murmur heard. Pulmonary/Chest: Breath sounds normal. She has no wheezes. She has no rales.  Frequent cough  Abdominal: Soft. Bowel sounds are normal. She exhibits no distension. There is no rebound and no guarding.  Musculoskeletal: Normal range of motion. She exhibits no edema.  Neurological: She is alert and oriented to person, place, and time.  Skin: Skin is warm and dry. She is not diaphoretic.  Psychiatric: She has a normal mood and affect. Her behavior is normal.    ED Course  Procedures (including critical care time)  Labs Reviewed - No data to display No results found.  1. Viral URI with cough   2.  Asthma, persistent not controlled    MDM  52 year old female with poorly controlled asthma presenting with cough found to have viral URI likely mildly exacerbating asthma IV Decadron and albuterol inhaler given with some improvement in symptoms.  Will start patient on controller medication. Refill of albuterol provided. Will also give mucinex at discharge for URI symptoms. Dextromethorphan avoided due to inducing dilantin. Told patient no dayquil due to hypertension.  Advised patient to follow up with PCP by end of the week.   Shelva Majestic, MD 07/06/12 1054

## 2012-07-06 NOTE — ED Notes (Signed)
Awaiting D/C papers 

## 2012-07-06 NOTE — ED Notes (Signed)
Pt c/o productive cough for 6 days. Pt reports sweating. Pt has tried dayquil and advil without relief.

## 2012-07-07 ENCOUNTER — Encounter (HOSPITAL_COMMUNITY): Payer: Self-pay | Admitting: Emergency Medicine

## 2012-07-07 ENCOUNTER — Emergency Department (HOSPITAL_COMMUNITY): Payer: Medicaid Other

## 2012-07-07 ENCOUNTER — Emergency Department (HOSPITAL_COMMUNITY)
Admission: EM | Admit: 2012-07-07 | Discharge: 2012-07-07 | Disposition: A | Discharge status: Home / Self Care | Payer: Medicaid Other | Attending: Emergency Medicine | Admitting: Emergency Medicine

## 2012-07-07 DIAGNOSIS — R079 Chest pain, unspecified: Secondary | ICD-10-CM | POA: Insufficient documentation

## 2012-07-07 DIAGNOSIS — Z79899 Other long term (current) drug therapy: Secondary | ICD-10-CM | POA: Insufficient documentation

## 2012-07-07 DIAGNOSIS — R6883 Chills (without fever): Secondary | ICD-10-CM | POA: Insufficient documentation

## 2012-07-07 DIAGNOSIS — J189 Pneumonia, unspecified organism: Secondary | ICD-10-CM

## 2012-07-07 DIAGNOSIS — I10 Essential (primary) hypertension: Secondary | ICD-10-CM | POA: Insufficient documentation

## 2012-07-07 DIAGNOSIS — J159 Unspecified bacterial pneumonia: Secondary | ICD-10-CM | POA: Insufficient documentation

## 2012-07-07 DIAGNOSIS — G40909 Epilepsy, unspecified, not intractable, without status epilepticus: Secondary | ICD-10-CM | POA: Insufficient documentation

## 2012-07-07 DIAGNOSIS — J45909 Unspecified asthma, uncomplicated: Secondary | ICD-10-CM | POA: Insufficient documentation

## 2012-07-07 DIAGNOSIS — Z880 Allergy status to penicillin: Secondary | ICD-10-CM | POA: Insufficient documentation

## 2012-07-07 LAB — CBC
HCT: 36.8 % (ref 36.0–46.0)
MCHC: 34.8 g/dL (ref 30.0–36.0)
MCV: 91.1 fL (ref 78.0–100.0)
RDW: 13.5 % (ref 11.5–15.5)
WBC: 15.7 10*3/uL — ABNORMAL HIGH (ref 4.0–10.5)

## 2012-07-07 LAB — POCT I-STAT, CHEM 8
BUN: 4 mg/dL — ABNORMAL LOW (ref 6–23)
Calcium, Ion: 1.15 mmol/L (ref 1.12–1.23)
Chloride: 101 mEq/L (ref 96–112)
Creatinine, Ser: 0.7 mg/dL (ref 0.50–1.10)
HCT: 39 % (ref 36.0–46.0)
HCT: 40 % (ref 36.0–46.0)
Hemoglobin: 13.3 g/dL (ref 12.0–15.0)
Hemoglobin: 13.6 g/dL (ref 12.0–15.0)
Sodium: 137 mEq/L (ref 135–145)
TCO2: 29 mmol/L (ref 0–100)

## 2012-07-07 LAB — BASIC METABOLIC PANEL
BUN: 5 mg/dL — ABNORMAL LOW (ref 6–23)
Chloride: 96 mEq/L (ref 96–112)
Creatinine, Ser: 0.61 mg/dL (ref 0.50–1.10)
GFR calc Af Amer: >90 mL/min (ref >90)
Glucose, Bld: 121 mg/dL — ABNORMAL HIGH (ref 70–99)

## 2012-07-07 MED ORDER — LEVOFLOXACIN 500 MG PO TABS: 500.0000 mg | ORAL_TABLET | Freq: Every day | ORAL | Status: DC

## 2012-07-07 MED ORDER — ASPIRIN 325 MG PO TABS: 325.0000 mg | ORAL_TABLET | ORAL | Status: DC

## 2012-07-07 MED ORDER — KETOROLAC TROMETHAMINE 15 MG/ML IJ SOLN
15.0000 mg | INTRAMUSCULAR | Status: DC
  Filled 2012-07-07: qty 1

## 2012-07-07 MED ORDER — LEVOFLOXACIN 750 MG PO TABS
750.0000 mg | ORAL_TABLET | Freq: Once | ORAL | Status: AC
  Administered 2012-07-07: 750 mg via ORAL
  Filled 2012-07-07: qty 1

## 2012-07-07 MED ORDER — ALBUTEROL SULFATE (5 MG/ML) 0.5% IN NEBU
2.5000 mg | INHALATION_SOLUTION | Freq: Once | RESPIRATORY_TRACT | Status: AC
  Administered 2012-07-07: 2.5 mg via RESPIRATORY_TRACT
  Filled 2012-07-07: qty 0.5

## 2012-07-07 MED ORDER — IPRATROPIUM BROMIDE 0.02 % IN SOLN
0.5000 mg | Freq: Once | RESPIRATORY_TRACT | Status: AC
  Administered 2012-07-07: 0.5 mg via RESPIRATORY_TRACT
  Filled 2012-07-07: qty 2.5

## 2012-07-07 MED ORDER — SODIUM CHLORIDE 0.9 % IV BOLUS (SEPSIS): 1000.0000 mL | Freq: Once | INTRAVENOUS | Status: DC

## 2012-07-07 MED ORDER — NITROGLYCERIN 0.4 MG SL SUBL: 0.4000 mg | SUBLINGUAL_TABLET | SUBLINGUAL | Status: DC | PRN

## 2012-07-07 NOTE — ED Provider Notes (Signed)
I saw and evaluated the patient, reviewed the resident's note and I agree with the findings and plan.   .Face to face Exam:  General:  Awake HEENT:  Atraumatic Resp:  Normal effort Abd:  Nondistended Neuro:No focal weakness   Nelia Shi, MD 07/07/12 [phone]

## 2012-07-07 NOTE — ED Provider Notes (Signed)
Patient presents to emergency room with persistent cough and chest comfort. She is concerned about the possibility of pneumonia. Patient was seen recently in emergent apartment and was treated with albuterol Atrovent. She was also given a prescription for guaifenesin.  Patient does not appear to be any significant distress here in the emergency department. Her chest x-ray shows a patchy left lower lobe pneumonia. We will check her labs. The patient most likely will be a candidate for outpatient treatment.  Celene Kras, MD 07/07/12 [phone]
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
## 2012-07-07 NOTE — ED Notes (Signed)
Patient transported to X-ray 

## 2012-10-23 ENCOUNTER — Encounter (HOSPITAL_COMMUNITY): Payer: Self-pay | Admitting: Emergency Medicine

## 2012-10-23 ENCOUNTER — Emergency Department (HOSPITAL_COMMUNITY): Payer: Medicaid Other

## 2012-10-23 ENCOUNTER — Emergency Department (HOSPITAL_COMMUNITY)
Admission: EM | Admit: 2012-10-23 | Discharge: 2012-10-24 | Disposition: A | Discharge status: Home / Self Care | Payer: Medicaid Other | Attending: Emergency Medicine | Admitting: Emergency Medicine

## 2012-10-23 DIAGNOSIS — J45909 Unspecified asthma, uncomplicated: Secondary | ICD-10-CM | POA: Insufficient documentation

## 2012-10-23 DIAGNOSIS — S0993XA Unspecified injury of face, initial encounter: Secondary | ICD-10-CM | POA: Insufficient documentation

## 2012-10-23 DIAGNOSIS — G40909 Epilepsy, unspecified, not intractable, without status epilepticus: Secondary | ICD-10-CM | POA: Insufficient documentation

## 2012-10-23 DIAGNOSIS — M549 Dorsalgia, unspecified: Secondary | ICD-10-CM

## 2012-10-23 DIAGNOSIS — I10 Essential (primary) hypertension: Secondary | ICD-10-CM | POA: Insufficient documentation

## 2012-10-23 DIAGNOSIS — Z79899 Other long term (current) drug therapy: Secondary | ICD-10-CM | POA: Insufficient documentation

## 2012-10-23 DIAGNOSIS — R519 Headache, unspecified: Secondary | ICD-10-CM

## 2012-10-23 DIAGNOSIS — R55 Syncope and collapse: Secondary | ICD-10-CM | POA: Insufficient documentation

## 2012-10-23 DIAGNOSIS — F1010 Alcohol abuse, uncomplicated: Secondary | ICD-10-CM | POA: Insufficient documentation

## 2012-10-23 DIAGNOSIS — IMO0002 Reserved for concepts with insufficient information to code with codable children: Secondary | ICD-10-CM | POA: Insufficient documentation

## 2012-10-23 DIAGNOSIS — T7411XA Adult physical abuse, confirmed, initial encounter: Secondary | ICD-10-CM | POA: Insufficient documentation
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
## 2012-10-24 ENCOUNTER — Emergency Department (HOSPITAL_COMMUNITY): Payer: Medicaid Other
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
## 2012-10-24 NOTE — ED Provider Notes (Signed)
Medical screening examination/treatment/procedure(s) were performed by non-physician practitioner and as supervising physician I was immediately available for consultation/collaboration.  Toinette Lackie M Oluwasemilore Bahl, MD 10/24/12 2040 

## 2012-10-24 NOTE — ED Provider Notes (Signed)
Medical screening examination/treatment/procedure(s) were performed by non-physician practitioner and as supervising physician I was immediately available for consultation/collaboration.  Olivia Mackie, MD 10/24/12 2040

## 2012-11-06 ENCOUNTER — Telehealth: Payer: Self-pay | Admitting: Nurse Practitioner

## 2012-11-06 NOTE — Telephone Encounter (Signed)
Call pt to try to schedule appt with Larita Fife, NP and the pt did not want to change her appt. Day or time and Larita Fife, NP did not have anything open at that time, on that day.

## 2012-11-19 ENCOUNTER — Ambulatory Visit: Payer: Medicaid Other | Admitting: Nurse Practitioner

## 2013-08-03 ENCOUNTER — Telehealth: Payer: Self-pay | Admitting: *Deleted

## 2013-08-03 NOTE — Telephone Encounter (Signed)
Tried calling patient to schedule appointment with NP Megan, numbers are invalid.

## 2013-08-04 ENCOUNTER — Telehealth: Payer: Self-pay | Admitting: *Deleted

## 2013-08-04 NOTE — Telephone Encounter (Signed)
Trying to reach patient to schedule a follow up appointment, have been unsuccessful with all numbers given, spoke with Kinnie Feil that is listed as her emergency contact and was given the number [phone], called this number and was told I had the wrong number, referral notes will be given back to [place].

## 2014-02-04 ENCOUNTER — Encounter (HOSPITAL_COMMUNITY): Payer: Self-pay | Admitting: Emergency Medicine

## 2014-02-04 ENCOUNTER — Emergency Department (HOSPITAL_COMMUNITY): Payer: Medicaid Other

## 2014-02-04 ENCOUNTER — Emergency Department (HOSPITAL_COMMUNITY)
Admission: EM | Admit: 2014-02-04 | Discharge: 2014-02-04 | Disposition: A | Discharge status: Home / Self Care | Payer: Medicaid Other | Attending: Emergency Medicine | Admitting: Emergency Medicine

## 2014-02-04 DIAGNOSIS — Y9289 Other specified places as the place of occurrence of the external cause: Secondary | ICD-10-CM | POA: Diagnosis not present

## 2014-02-04 DIAGNOSIS — Y998 Other external cause status: Secondary | ICD-10-CM | POA: Diagnosis not present

## 2014-02-04 DIAGNOSIS — S93401A Sprain of unspecified ligament of right ankle, initial encounter: Secondary | ICD-10-CM | POA: Diagnosis not present

## 2014-02-04 DIAGNOSIS — Z79899 Other long term (current) drug therapy: Secondary | ICD-10-CM | POA: Diagnosis not present

## 2014-02-04 DIAGNOSIS — Y9389 Activity, other specified: Secondary | ICD-10-CM | POA: Diagnosis not present

## 2014-02-04 DIAGNOSIS — F10120 Alcohol abuse with intoxication, uncomplicated: Secondary | ICD-10-CM | POA: Insufficient documentation

## 2014-02-04 DIAGNOSIS — J45909 Unspecified asthma, uncomplicated: Secondary | ICD-10-CM | POA: Diagnosis not present

## 2014-02-04 DIAGNOSIS — S99911A Unspecified injury of right ankle, initial encounter: Secondary | ICD-10-CM | POA: Diagnosis present

## 2014-02-04 DIAGNOSIS — X58XXXA Exposure to other specified factors, initial encounter: Secondary | ICD-10-CM | POA: Diagnosis not present

## 2014-02-04 DIAGNOSIS — W19XXXA Unspecified fall, initial encounter: Secondary | ICD-10-CM

## 2014-02-04 DIAGNOSIS — F10920 Alcohol use, unspecified with intoxication, uncomplicated: Secondary | ICD-10-CM

## 2014-02-04 DIAGNOSIS — Z880 Allergy status to penicillin: Secondary | ICD-10-CM | POA: Insufficient documentation

## 2014-02-04 DIAGNOSIS — G40909 Epilepsy, unspecified, not intractable, without status epilepticus: Secondary | ICD-10-CM | POA: Insufficient documentation

## 2014-02-04 DIAGNOSIS — I10 Essential (primary) hypertension: Secondary | ICD-10-CM | POA: Diagnosis not present

## 2014-02-04 NOTE — ED Notes (Addendum)
Writer spoke with man at [phone] (father's residence Kinnie Feil)  who will arrange transportation home for pt. He was made aware pt will be waiting in the ED Waiting area.

## 2014-02-04 NOTE — Discharge Instructions (Signed)
Rest, Ice intermittently (in the first 24-48 hours), Gentle compression with an Ace wrap, and elevate (Limb above the level of the heart)   Take up to 800mg  of ibuprofen (that is usually 4 over the counter pills)  3 times a day for 5 days. Take with food.   Alcohol Intoxication Alcohol intoxication occurs when you drink enough alcohol that it affects your ability to function. It can be mild or very severe. Drinking a lot of alcohol in a short time is called binge drinking. This can be very harmful. Drinking alcohol can also be more dangerous if you are taking medicines or other drugs. Some of the effects caused by alcohol may include:  Loss of coordination.  Changes in mood and behavior.  Unclear thinking.  Trouble talking (slurred speech).  Throwing up (vomiting).  Confusion.  Slowed breathing.  Twitching and shaking (seizures).  Loss of consciousness. HOME CARE  Do not drive after drinking alcohol.  Drink enough water and fluids to keep your pee (urine) clear or pale yellow. Avoid caffeine.  Only take medicine as told by your doctor. GET HELP IF:  You throw up (vomit) many times.  You do not feel better after a few days.  You frequently have alcohol intoxication. Your doctor can help decide if you should see a substance use treatment counselor. GET HELP RIGHT AWAY IF:  You become shaky when you stop drinking.  You have twitching and shaking.  You throw up blood. It may look bright red or like coffee grounds.  You notice blood in your poop (bowel movements).  You become lightheaded or pass out (faint). MAKE SURE YOU:   Understand these instructions.  Will watch your condition.  Will get help right away if you are not doing well or get worse. Document Released: 07/10/2007 Document Revised: 09/23/2012 Document Reviewed: 06/26/2012 [facility] Patient Information 2015 [place], [place]. This information is not intended to replace advice given to you by your health  care provider. Make sure you discuss any questions you have with your health care provider.

## 2014-02-04 NOTE — ED Notes (Signed)
Patient tolerating PO intake, patient ride home at the bedside.
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
## 2014-04-05 DIAGNOSIS — K529 Noninfective gastroenteritis and colitis, unspecified: Secondary | ICD-10-CM

## 2014-04-05 HISTORY — DX: Noninfective gastroenteritis and colitis, unspecified: K52.9

## 2014-04-14 ENCOUNTER — Encounter (HOSPITAL_COMMUNITY): Payer: Self-pay | Admitting: *Deleted

## 2014-04-14 ENCOUNTER — Emergency Department (HOSPITAL_COMMUNITY): Payer: Medicaid Other

## 2014-04-14 ENCOUNTER — Inpatient Hospital Stay (HOSPITAL_COMMUNITY)
Admission: EM | Admit: 2014-04-14 | Discharge: 2014-04-16 | DRG: 377 | Disposition: A | Discharge status: Home / Self Care | Payer: Medicaid Other | Attending: Infectious Disease | Admitting: Infectious Disease

## 2014-04-14 DIAGNOSIS — A09 Infectious gastroenteritis and colitis, unspecified: Secondary | ICD-10-CM

## 2014-04-14 DIAGNOSIS — K861 Other chronic pancreatitis: Secondary | ICD-10-CM | POA: Diagnosis present

## 2014-04-14 DIAGNOSIS — K7010 Alcoholic hepatitis without ascites: Secondary | ICD-10-CM | POA: Diagnosis present

## 2014-04-14 DIAGNOSIS — G40909 Epilepsy, unspecified, not intractable, without status epilepticus: Secondary | ICD-10-CM

## 2014-04-14 DIAGNOSIS — F1020 Alcohol dependence, uncomplicated: Secondary | ICD-10-CM | POA: Diagnosis present

## 2014-04-14 DIAGNOSIS — R933 Abnormal findings on diagnostic imaging of other parts of digestive tract: Secondary | ICD-10-CM | POA: Diagnosis present

## 2014-04-14 DIAGNOSIS — K760 Fatty (change of) liver, not elsewhere classified: Secondary | ICD-10-CM | POA: Diagnosis present

## 2014-04-14 DIAGNOSIS — K922 Gastrointestinal hemorrhage, unspecified: Secondary | ICD-10-CM | POA: Diagnosis present

## 2014-04-14 DIAGNOSIS — D62 Acute posthemorrhagic anemia: Secondary | ICD-10-CM | POA: Diagnosis present

## 2014-04-14 DIAGNOSIS — J45909 Unspecified asthma, uncomplicated: Secondary | ICD-10-CM | POA: Diagnosis present

## 2014-04-14 DIAGNOSIS — I10 Essential (primary) hypertension: Secondary | ICD-10-CM | POA: Diagnosis present

## 2014-04-14 DIAGNOSIS — K5190 Ulcerative colitis, unspecified, without complications: Secondary | ICD-10-CM | POA: Diagnosis present

## 2014-04-14 DIAGNOSIS — Z880 Allergy status to penicillin: Secondary | ICD-10-CM | POA: Diagnosis not present

## 2014-04-14 DIAGNOSIS — K769 Liver disease, unspecified: Secondary | ICD-10-CM | POA: Diagnosis present

## 2014-04-14 DIAGNOSIS — E43 Unspecified severe protein-calorie malnutrition: Secondary | ICD-10-CM | POA: Diagnosis present

## 2014-04-14 DIAGNOSIS — K625 Hemorrhage of anus and rectum: Secondary | ICD-10-CM | POA: Diagnosis present

## 2014-04-14 DIAGNOSIS — F1010 Alcohol abuse, uncomplicated: Secondary | ICD-10-CM | POA: Diagnosis present

## 2014-04-14 DIAGNOSIS — D696 Thrombocytopenia, unspecified: Secondary | ICD-10-CM | POA: Diagnosis present

## 2014-04-14 DIAGNOSIS — Z6822 Body mass index (BMI) 22.0-22.9, adult: Secondary | ICD-10-CM | POA: Diagnosis not present

## 2014-04-14 DIAGNOSIS — R109 Unspecified abdominal pain: Secondary | ICD-10-CM | POA: Diagnosis present

## 2014-04-14 DIAGNOSIS — K2971 Gastritis, unspecified, with bleeding: Secondary | ICD-10-CM | POA: Diagnosis present

## 2014-04-14 DIAGNOSIS — E876 Hypokalemia: Secondary | ICD-10-CM | POA: Diagnosis not present

## 2014-04-14 HISTORY — DX: Low back pain, unspecified: M54.50

## 2014-04-14 HISTORY — DX: Tubulo-interstitial nephritis, not specified as acute or chronic: N12

## 2014-04-14 HISTORY — DX: Acute pancreatitis without necrosis or infection, unspecified: K85.90

## 2014-04-14 HISTORY — DX: Gastrointestinal hemorrhage, unspecified: K92.2

## 2014-04-14 HISTORY — DX: Alcohol dependence, uncomplicated: F10.20

## 2014-04-14 HISTORY — DX: Low back pain: M54.5

## 2014-04-14 HISTORY — DX: Other psychoactive substance abuse, uncomplicated: F19.10

## 2014-04-14 HISTORY — DX: Other chronic pain: G89.29

## 2014-04-14 HISTORY — DX: Noninfective gastroenteritis and colitis, unspecified: K52.9

## 2014-04-14 HISTORY — DX: Headache: R51

## 2014-04-14 HISTORY — DX: Headache, unspecified: R51.9

## 2014-04-14 LAB — ETHANOL: Alcohol, Ethyl (B): 145 mg/dL — ABNORMAL HIGH (ref 0–9)

## 2014-04-14 LAB — CBC WITH DIFFERENTIAL/PLATELET
BASOS ABS: 0.1 10*3/uL (ref 0.0–0.1)
Basophils Relative: 1 % (ref 0–1)
EOS ABS: 0.2 10*3/uL (ref 0.0–0.7)
Eosinophils Relative: 3 % (ref 0–5)
HEMATOCRIT: 26.2 % — AB (ref 36.0–46.0)
HEMOGLOBIN: 8.7 g/dL — AB (ref 12.0–15.0)
Lymphocytes Relative: 41 % (ref 12–46)
Lymphs Abs: 2.6 10*3/uL (ref 0.7–4.0)
MCH: 32.8 pg (ref 26.0–34.0)
MCHC: 33.2 g/dL (ref 30.0–36.0)
MCV: 98.9 fL (ref 78.0–100.0)
MONO ABS: 0.6 10*3/uL (ref 0.1–1.0)
Monocytes Relative: 10 % (ref 3–12)
NEUTROS ABS: 2.8 10*3/uL (ref 1.7–7.7)
Neutrophils Relative %: 44 % (ref 43–77)
Platelets: 72 10*3/uL — ABNORMAL LOW (ref 150–400)
RBC: 2.65 MIL/uL — ABNORMAL LOW (ref 3.87–5.11)
RDW: 13.4 % (ref 11.5–15.5)
WBC: 6.3 10*3/uL (ref 4.0–10.5)

## 2014-04-14 LAB — COMPREHENSIVE METABOLIC PANEL
ALBUMIN: 3.2 g/dL — AB (ref 3.5–5.2)
ALT: 45 U/L — AB (ref 0–35)
AST: 110 U/L — ABNORMAL HIGH (ref 0–37)
Alkaline Phosphatase: 74 U/L (ref 39–117)
Anion gap: 12 (ref 5–15)
BUN: 7 mg/dL (ref 6–23)
CHLORIDE: 104 mmol/L (ref 96–112)
CO2: 23 mmol/L (ref 19–32)
CREATININE: 0.68 mg/dL (ref 0.50–1.10)
Calcium: 8 mg/dL — ABNORMAL LOW (ref 8.4–10.5)
GFR calc Af Amer: >90 mL/min (ref >90)
GLUCOSE: 100 mg/dL — AB (ref 70–99)
Potassium: 3.5 mmol/L (ref 3.5–5.1)
Sodium: 139 mmol/L (ref 135–145)
Total Bilirubin: 0.4 mg/dL (ref 0.3–1.2)
Total Protein: 5.9 g/dL — ABNORMAL LOW (ref 6.0–8.3)

## 2014-04-14 LAB — TYPE AND SCREEN
ABO/RH(D): B POS
ANTIBODY SCREEN: NEGATIVE

## 2014-04-14 LAB — URINALYSIS, ROUTINE W REFLEX MICROSCOPIC
Bilirubin Urine: NEGATIVE
GLUCOSE, UA: NEGATIVE mg/dL
Ketones, ur: NEGATIVE mg/dL
LEUKOCYTES UA: NEGATIVE
NITRITE: NEGATIVE
Protein, ur: NEGATIVE mg/dL
SPECIFIC GRAVITY, URINE: 1.026 (ref 1.005–1.030)
UROBILINOGEN UA: 0.2 mg/dL (ref 0.0–1.0)
pH: 5.5 (ref 5.0–8.0)

## 2014-04-14 LAB — LACTIC ACID, PLASMA
LACTIC ACID, VENOUS: 2.9 mmol/L — AB (ref 0.5–2.0)
LACTIC ACID, VENOUS: 2.5 mmol/L — AB (ref 0.5–2.0)

## 2014-04-14 LAB — CBC
HCT: 22.7 % — ABNORMAL LOW (ref 36.0–46.0)
Hemoglobin: 7.6 g/dL — ABNORMAL LOW (ref 12.0–15.0)
MCH: 32.5 pg (ref 26.0–34.0)
MCHC: 33.5 g/dL (ref 30.0–36.0)
MCV: 97 fL (ref 78.0–100.0)
PLATELETS: 65 10*3/uL — AB (ref 150–400)
RBC: 2.34 MIL/uL — AB (ref 3.87–5.11)
RDW: 13.6 % (ref 11.5–15.5)
WBC: 4.6 10*3/uL (ref 4.0–10.5)

## 2014-04-14 LAB — MRSA PCR SCREENING: MRSA by PCR: NEGATIVE

## 2014-04-14 LAB — POC OCCULT BLOOD, ED: FECAL OCCULT BLD: POSITIVE — AB

## 2014-04-14 LAB — ABO/RH: ABO/RH(D): B POS

## 2014-04-14 LAB — URINE MICROSCOPIC-ADD ON

## 2014-04-14 LAB — PROTIME-INR
INR: 1.09 (ref 0.00–1.49)
Prothrombin Time: 14.2 seconds (ref 11.6–15.2)

## 2014-04-14 LAB — LIPASE, BLOOD: LIPASE: 17 U/L (ref 11–59)

## 2014-04-14 MED ORDER — IOHEXOL 300 MG/ML  SOLN
80.0000 mL | Freq: Once | INTRAMUSCULAR | Status: AC | PRN
  Administered 2014-04-14: 80 mL via INTRAVENOUS

## 2014-04-14 MED ORDER — PHENYTOIN SODIUM EXTENDED 100 MG PO CAPS
300.0000 mg | ORAL_CAPSULE | Freq: Every day | ORAL | Status: DC
  Administered 2014-04-14 – 2014-04-15 (×2): 300 mg via ORAL
  Filled 2014-04-14 (×4): qty 3

## 2014-04-14 MED ORDER — THIAMINE HCL 100 MG/ML IJ SOLN
100.0000 mg | Freq: Every day | INTRAMUSCULAR | Status: DC
  Filled 2014-04-14 (×2): qty 1

## 2014-04-14 MED ORDER — ADULT MULTIVITAMIN W/MINERALS CH
1.0000 | ORAL_TABLET | Freq: Every day | ORAL | Status: DC
  Administered 2014-04-14 – 2014-04-16 (×3): 1 via ORAL
  Filled 2014-04-14 (×3): qty 1

## 2014-04-14 MED ORDER — LORAZEPAM 2 MG/ML IJ SOLN: 1.0000 mg | Freq: Four times a day (QID) | INTRAMUSCULAR | Status: DC | PRN

## 2014-04-14 MED ORDER — IOHEXOL 300 MG/ML  SOLN
25.0000 mL | INTRAMUSCULAR | Status: AC
  Administered 2014-04-14: 25 mL via ORAL

## 2014-04-14 MED ORDER — SODIUM CHLORIDE 0.9 % IV SOLN
INTRAVENOUS | Status: DC
  Administered 2014-04-14 (×2): via INTRAVENOUS

## 2014-04-14 MED ORDER — PANCRELIPASE (LIP-PROT-AMYL) 12000-38000 UNITS PO CPEP
1.0000 | ORAL_CAPSULE | Freq: Three times a day (TID) | ORAL | Status: DC
  Administered 2014-04-15 – 2014-04-16 (×5): 12000 [IU] via ORAL
  Filled 2014-04-14 (×8): qty 1

## 2014-04-14 MED ORDER — PANTOPRAZOLE SODIUM 40 MG IV SOLR
40.0000 mg | Freq: Two times a day (BID) | INTRAVENOUS | Status: DC
  Administered 2014-04-14: 40 mg via INTRAVENOUS
  Filled 2014-04-14 (×3): qty 40

## 2014-04-14 MED ORDER — INFLUENZA VAC SPLIT QUAD 0.5 ML IM SUSY
0.5000 mL | PREFILLED_SYRINGE | INTRAMUSCULAR | Status: DC
  Filled 2014-04-14: qty 0.5

## 2014-04-14 MED ORDER — VITAMIN B-1 100 MG PO TABS
100.0000 mg | ORAL_TABLET | Freq: Every day | ORAL | Status: DC
  Administered 2014-04-14 – 2014-04-16 (×3): 100 mg via ORAL
  Filled 2014-04-14 (×3): qty 1

## 2014-04-14 MED ORDER — FOLIC ACID 1 MG PO TABS
1.0000 mg | ORAL_TABLET | Freq: Every day | ORAL | Status: DC
  Administered 2014-04-15 – 2014-04-16 (×2): 1 mg via ORAL
  Filled 2014-04-14 (×2): qty 1

## 2014-04-14 MED ORDER — SODIUM CHLORIDE 0.9 % IV BOLUS (SEPSIS)
500.0000 mL | Freq: Once | INTRAVENOUS | Status: AC
  Administered 2014-04-14: 500 mL via INTRAVENOUS

## 2014-04-14 MED ORDER — LORAZEPAM 1 MG PO TABS
1.0000 mg | ORAL_TABLET | Freq: Four times a day (QID) | ORAL | Status: DC | PRN
  Administered 2014-04-15 – 2014-04-16 (×3): 1 mg via ORAL
  Filled 2014-04-14: qty 2
  Filled 2014-04-14: qty 1
  Filled 2014-04-14: qty 2

## 2014-04-14 MED ORDER — SODIUM CHLORIDE 0.9 % IV BOLUS (SEPSIS)
1000.0000 mL | Freq: Once | INTRAVENOUS | Status: AC
  Administered 2014-04-14: 1000 mL via INTRAVENOUS

## 2014-04-14 MED ORDER — PANTOPRAZOLE SODIUM 40 MG PO TBEC: 40.0000 mg | DELAYED_RELEASE_TABLET | Freq: Every day | ORAL | Status: DC

## 2014-04-14 MED ORDER — SODIUM CHLORIDE 0.9 % IV BOLUS (SEPSIS)
500.0000 mL | Freq: Once | INTRAVENOUS | Status: AC
Start: 2014-04-14 — End: 2014-04-14
  Administered 2014-04-14: 500 mL via INTRAVENOUS

## 2014-04-14 NOTE — H&P (Signed)
Date: 04/14/2014               Patient Name:  Brandy Bishop MRN: [id number]  DOB: 1960-11-25 Age / Sex: 54 y.o., female   PCP: No primary care provider on file.         Medical Service: Internal Medicine Teaching Service         Attending Physician: Dr. Truman Hayward, MD    First Contact: Dr. Julious Oka Pager: [phone]  Second Contact: Dr. Joni Reining Pager: [phone]       After Hours (After 5p/  First Contact Pager: [phone]  weekends / holidays): Second Contact Pager: [phone]   Chief Complaint: hematochezia  History of Present Illness: Pt is a 54 y/o female with PMHx of seizures, asthma, HTN, and alcoholism who presents with hematochezia that started yesterday evening. Pt had 2 bloody bowel movements yesterday and then one today in the ED. Pt states that her stools were brown and the toilet bowl water was red. Since last night pt has had nausea but no vomiting, abdominal pain, and decreased appetite. She has had fatigue x 1 month and abdominal pain x 1 week. Denies SOB, chest pain, blurry vision. Has history of hematochezia requiring blood transfusion in the remote past. Denies having a colonoscopy. Pt has hx of alcoholism and last had 2 beers last night.   In the ED pt was hypotensive down to 80/40's. Pt given 1L NS bolus during exam and 2 peripheral IV access ordered. CT of abd/pelvis obtained while in the ED revealed possible uncomplicated colitis, hepatic steatosis, and chronic pancreatitis.   Meds: Current Facility-Administered Medications  Medication Dose Route Frequency Provider Last Rate Last Dose  . 0.9 %  sodium chloride infusion   Intravenous Continuous Jessee Avers, MD      . Derrill Memo ON 04/15/2014] pantoprazole (PROTONIX) EC tablet 40 mg  40 mg Oral Q0600 Vena Rua, PA-C      . sodium chloride 0.9 % bolus 1,000 mL  1,000 mL Intravenous Once Jessee Avers, MD 1,000 mL/hr at 04/14/14 1634 1,000 mL at 04/14/14 1634   Current Outpatient Prescriptions    Medication Sig Dispense Refill  . lipase/protease/amylase (CREON-10/PANCREASE) 12000 UNITS CPEP Take 1 capsule by mouth 3 (three) times daily before meals. 270 capsule 0  . phenytoin (DILANTIN) 100 MG ER capsule Take 3 capsules (300 mg total) by mouth at bedtime. 90 capsule 0  . albuterol (PROVENTIL HFA;VENTOLIN HFA) 108 (90 BASE) MCG/ACT inhaler Inhale 2 puffs into the lungs once. (Patient not taking: Reported on 04/14/2014) 1 Inhaler 0  . Multiple Vitamin (MULTIVITAMIN WITH MINERALS) TABS Take 1 tablet by mouth daily. (Patient not taking: Reported on 04/14/2014) 30 tablet 0    Allergies: Allergies as of 04/14/2014 - Review Complete 04/14/2014  Allergen Reaction Noted  . Penicillins Swelling 03/15/2011   Past Medical History  Diagnosis Date  . Seizures   . Asthma   . Hypertension   . Pancreatitis     Chronic pancreatitis noted on CT scan in 04/2014.  . Colitis 04/2014.    Bloody diarrhea  . Alcoholism 2011  . Polysubstance abuse 2011  . Pyelonephritis 05/2012   History reviewed. No pertinent past surgical history. Family History  Problem Relation Age of Onset  . Cancer Mother   . Cancer Brother    History   Social History  . Marital Status: Single    Spouse Name: N/A  . Number of Children: N/A  . Years of Education:  N/A   Occupational History  . Not on file.   Social History Main Topics  . Smoking status: Never Smoker   . Smokeless tobacco: Never Used  . Alcohol Use: 6.0 oz/week    10 Cans of beer per week  . Drug Use: No  . Sexual Activity: Not on file   Other Topics Concern  . Not on file   Social History Narrative   Lives in [place].    Disabled due to seizures.    Stay with a friend.    Single. No kids.     Review of Systems: Negative for recent travel, sick contacts, constipation.  Positive for 30lbs weight loss in last 6 months Pt is post menopausal.   Physical Exam: Blood pressure 110/67, pulse 77, temperature 98.4 F (36.9 C), temperature source  Oral, resp. rate 17, height 5\' 3"  (1.6 m), weight 124 lb (56.246 kg), SpO2 98 %. General: lethargic, fatigued HEENT: dry mucous membranes Lungs: CTAB, no wheezing Cardiac: RRR, no murmurs GI: soft, active bowel sounds, non tender to palpation  Skin: skin pallor, no rashes Neuro: CN II-XII grossly intact Ext: neg for pedal edema   Lab results: Basic Metabolic Panel:  Recent Labs  04/14/14 1300  NA 139  K 3.5  CL 104  CO2 23  GLUCOSE 100*  BUN 7  CREATININE 0.68  CALCIUM 8.0*   Liver Function Tests:  Recent Labs  04/14/14 1300  AST 110*  ALT 45*  ALKPHOS 74  BILITOT 0.4  PROT 5.9*  ALBUMIN 3.2*    Recent Labs  04/14/14 1300  LIPASE 17   CBC:  Recent Labs  04/14/14 1300  WBC 6.3  NEUTROABS 2.8  HGB 8.7*  HCT 26.2*  MCV 98.9  PLT 72*   Coagulation:  Recent Labs  04/14/14 1300  LABPROT 14.2  INR 1.09   Urine Drug Screen: Drugs of Abuse     Component Value Date/Time   LABOPIA NONE DETECTED 06/26/2012 0134   LABOPIA NEG 05/31/2009 2246   COCAINSCRNUR NONE DETECTED 06/26/2012 0134   COCAINSCRNUR POS* 05/31/2009 2246   LABBENZ NONE DETECTED 06/26/2012 0134   LABBENZ NEG 05/31/2009 2246   AMPHETMU NONE DETECTED 06/26/2012 0134   AMPHETMU NEG 05/31/2009 2246   THCU NONE DETECTED 06/26/2012 0134   LABBARB NONE DETECTED 06/26/2012 0134    Urinalysis:  Recent Labs  04/14/14 1419  COLORURINE YELLOW  LABSPEC 1.026  PHURINE 5.5  GLUCOSEU NEGATIVE  HGBUR SMALL*  BILIRUBINUR NEGATIVE  KETONESUR NEGATIVE  PROTEINUR NEGATIVE  UROBILINOGEN 0.2  NITRITE NEGATIVE  LEUKOCYTESUR NEGATIVE     Imaging results:  Ct Abdomen Pelvis W Contrast  04/14/2014   CLINICAL DATA:  2-3 episodes of rectal bleeding.  Abdominal pain.  EXAM: CT ABDOMEN AND PELVIS WITH CONTRAST  TECHNIQUE: Multidetector CT imaging of the abdomen and pelvis was performed using the standard protocol following bolus administration of intravenous contrast.  CONTRAST:  76mL OMNIPAQUE  IOHEXOL 300 MG/ML  SOLN  COMPARISON:  05/06/2012.  FINDINGS: Lower chest: Patchy areas of ground-glass attenuation are noted in both lung bases. There is no pleural effusion identified.  Hepatobiliary: Diffuse hepatic steatosis noted. No suspicious liver abnormality. The gallbladder is normal. No biliary dilatation.  Pancreas: There is atrophy of the pancreas. Calcifications in the distribution of the pancreatic head noted.  Spleen: Negative  Adrenals/Urinary Tract: The adrenal glands are both normal. Chronic scarring involving the left kidney noted. Right kidney is normal. The urinary bladder is unremarkable.  Stomach/Bowel: The stomach  is normal. The small bowel loops have a normal course and caliber. No obstruction. The appendix is visualized and appears normal. Numerous colonic diverticula noted. There is mild wall thickening involving the ascending colon, transverse colon and descending colon. No pneumatosis or evidence of bowel perforation. No obstructing mass noted.  Vascular/Lymphatic: Normal appearance of the abdominal aorta.  Reproductive: The uterus and the adnexal structures are unremarkable for patient's age.  Other: No free fluid or fluid collections within the abdomen or pelvis.  Musculoskeletal: The visualized bony structures are significant for mild lumbar degenerative disc disease.  IMPRESSION: 1. There is mild wall thickening involving the ascending colon, transverse colon and descending colon worrisome for uncomplicated colitis. No pneumatosis or evidence of bowel perforation. No abscess. 2. Hepatic steatosis. 3. Changes within the pancreas compatible with chronic pancreatitis.   Electronically Signed   By: Kerby Moors M.D.   On: 04/14/2014 14:24    Other results: EKG: sinus rhythm, prolonged QTc interval (509), no significant changes from prior EKG on 07/07/12  Assessment & Plan by Problem: Active Problems:   Seizure disorder   GI bleed   GI bleed--lactic acid 2.9, hypotensive  during exam, and lethargic. Hgb 8.7 on presentation, in 2014 hgb was 13.3. Given 1L NS bolus in the ED and 2 IV access obtained. Rectal exam in the ED WNL. Dark blood noted in stool, FOBT positive.  Pt non tender to abd palpation on exam. Denies hx of stomach ulcers. BUN WNL. DDx include diverticulosis, av malformation, internal hemorrhoids, PUD, or brisk upper GI bleed. - admit to step down, will reassess pt again tonight for need of additional IVF bolus - NS at 150cc/hr - pt typed and screened - GI consulted in the ED.  - CBC q8h and vital signs x 6h - IV protonix 40mg  daily  Alcohol abuse- last drink was last night. AST/ALT >2.  - ethanol level ordered - CIWA protocol - thiamine, folate, MVI  Seizure d/o-- seen by Dr. Jannifer Franklin with [facility]. - continue home dilantin 300mg  qhs  Chronic pancreatitis-- lipase WNL at 17 - continue home creon  FEN - clear liquid diet  DVT ppx-- SCDs  Dispo: Disposition is deferred at this time, awaiting improvement of current medical problems. Anticipated discharge in approximately 2-3 day(s).   The patient does have a current PCP (Dr. Santiago Glad) and does not need an [facility] hospital follow-up appointment after discharge.  The patient does not have transportation limitations that hinder transportation to clinic appointments.  Signed: Norman Herrlich, MD 04/14/2014, 5:18 PM

## 2014-04-14 NOTE — Consult Note (Signed)
[facility] Consult: 4:00 PM 04/14/2014  LOS: 0 days    Referring Provider:  Dr. Aline Brochure in the ED. Patient being admitted to teaching service, Dr. Tommy Medal Primary Care Physician:  No primary care provider on file. Primary Gastroenterologist:  Althia Forts    Reason for Consultation:  Bloody stool with colitis on CT scan   HPI: Brandy Bishop is a 54 y.o. female. Poor historian, doesn't know her meds except for Dialntin and "pancreas" medication.  She is an alcoholic and has history of polysubstance abuse. History seizure disorder, asthma. Fatty liver on CT of 2011. Chronic pancreatitis changes seen on CT scans dating back to 2003.  History of pyelonephritis.  At least one week of diffuse abdominal pain, 7/10 at worst.  No nausea, + anorexia. Last night started having bloody stools: bright red blood.    Hemoglobin is 8.7. MCV normal at 98. Platelets low at 72.  Coags are normal. Lactic acid level is elevated at 2.9.  Renal function is not compromised. Transaminases elevated at 110/45. Previous hgbs of 10.9-13.3 in 2014. Bloody stool and FOBT + noted in ED.  Pt lethargic.  No previous EGDs or colonoscopies.  Had some rectal bleeding a few years ago.   No NSAIDs/ASA.  Admits to drinking three 12 oz malt liquors per day.     Past Medical History  Diagnosis Date  . Seizures   . Asthma   . Hypertension   . GI bleed   . Pancreatitis     History reviewed. No pertinent past surgical history.  Prior to Admission medications   Medication Sig Start Date End Date Taking? Authorizing Provider  lipase/protease/amylase (CREON-10/PANCREASE) 12000 UNITS CPEP Take 1 capsule by mouth 3 (three) times daily before meals. 05/11/12  Yes Belkys A Regalado, MD  phenytoin (DILANTIN) 100 MG ER capsule Take 3 capsules (300 mg  total) by mouth at bedtime. 06/26/12  Yes John Molpus, MD  albuterol (PROVENTIL HFA;VENTOLIN HFA) 108 (90 BASE) MCG/ACT inhaler Inhale 2 puffs into the lungs once. Patient not taking: Reported on 04/14/2014 07/06/12   Marin Olp, MD  Multiple Vitamin (MULTIVITAMIN WITH MINERALS) TABS Take 1 tablet by mouth daily. Patient not taking: Reported on 04/14/2014 05/11/12   Elmarie Shiley, MD    Scheduled Meds:  Infusions:  PRN Meds:    Allergies as of 04/14/2014 - Review Complete 04/14/2014  Allergen Reaction Noted  . Penicillins Swelling 03/15/2011    Family History  Problem Relation Age of Onset  . Cancer Mother   . Cancer Brother     History   Social History  . Marital Status: Single    Spouse Name: N/A  . Number of Children: N/A  . Years of Education: N/A   Occupational History  . Not on file.   Social History Main Topics  . Smoking status: Never Smoker   . Smokeless tobacco: Never Used  . Alcohol Use: 6.0 oz/week    10 Cans of beer per week  . Drug Use: No  . Sexual Activity: Not on file  Other Topics Concern  . Not on file   Social History Narrative   Lives in [place].    Disabled due to seizures.    Stay with a friend.    Single. No kids.     REVIEW OF SYSTEMS: Constitutional:  Not able to verify weight loss or gain ENT:  No nose bleeds Pulm:  Some dry cough, some dyspnea CV:  No palpitations, no LE edema.  GU:  No hematuria, no frequency GI:  Per HPI.   Heme:  No unusual bleeding or bruising except the GI as HPI   Transfusions:  None.  Neuro:  No headaches, no peripheral tingling or numbness Derm:  No itching, no rash or sores.  Endocrine:  No sweats or chills.  No polyuria or dysuria Immunization:  Not queried.  Travel:  None beyond local counties in last few months.    PHYSICAL EXAM: Vital signs in last 24 hours: Filed Vitals:   04/14/14 1550  BP: 98/62  Pulse: 80  Temp:   Resp: 16   Wt Readings from Last 3 Encounters:  04/14/14 124  lb (56.246 kg)  02/04/14 124 lb (56.246 kg)  05/20/12 124 lb (56.246 kg)    General: thin, ill, cachectic.  comfortable Head:  No trauma or facial asymmetry  Eyes:  No icterus, slight conj pallor Ears:  Not HOH  Nose:  No congestion or discharge Mouth:  Clear, moist oral MM.  Teeth in poor condition.  Neck:  No jvd.  No TMG.  No mass Lungs:  Clear bil.  Diminished BS Heart: RRR.  No mrg Abdomen:  Soft, mild tenderness, no R/G.  BS present.  No tinkling or tympanitic.   Rectal: deferred   Musc/Skeltl: no joint erythema or swelling Extremities:  No CCE.   Muscle wasting in limbs  Neurologic:  No  Tremor.  Oriented x 3.  lethargic Skin:  No sores or rash.  Nodes:  No cervical adenopathy.    Psych:  Depressed, calm.  Cooperative.   Intake/Output from previous day:   Intake/Output this shift:    LAB RESULTS:  Recent Labs  04/14/14 1300  WBC 6.3  HGB 8.7*  HCT 26.2*  PLT 72*  MCV     98.9   BMET Lab Results  Component Value Date   NA 139 04/14/2014   NA 137 07/07/2012   NA 137 07/07/2012   K 3.5 04/14/2014   K 3.5 07/07/2012   K 3.5 07/07/2012   CL 104 04/14/2014   CL 101 07/07/2012   CL 101 07/07/2012   CO2 23 04/14/2014   CO2 24 07/07/2012   CO2 23 06/26/2012   GLUCOSE 100* 04/14/2014   GLUCOSE 117* 07/07/2012   GLUCOSE 118* 07/07/2012   BUN 7 04/14/2014   BUN 4* 07/07/2012   BUN 4* 07/07/2012   CREATININE 0.68 04/14/2014   CREATININE 0.70 07/07/2012   CREATININE 0.70 07/07/2012   CALCIUM 8.0* 04/14/2014   CALCIUM 9.5 07/07/2012   CALCIUM 8.9 06/26/2012   LFT  Recent Labs  04/14/14 1300  PROT 5.9*  ALBUMIN 3.2*  AST 110*  ALT 45*  ALKPHOS 74  BILITOT 0.4   PT/INR Lab Results  Component Value Date   INR 1.09 04/14/2014   INR 0.91 11/29/2009   Hepatitis Panel No results for input(s): HEPBSAG, HCVAB, HEPAIGM, HEPBIGM in the last 72 hours. C-Diff No components found for: CDIFF Lipase     Component Value Date/Time   LIPASE 17  04/14/2014 1300  RADIOLOGY STUDIES: Ct Abdomen Pelvis W Contrast  04/14/2014   CLINICAL DATA:  2-3 episodes of rectal bleeding.  Abdominal pain.  EXAM: CT ABDOMEN AND PELVIS WITH CONTRAST  TECHNIQUE: Multidetector CT imaging of the abdomen and pelvis was performed using the standard protocol following bolus administration of intravenous contrast.  CONTRAST:  66mL OMNIPAQUE IOHEXOL 300 MG/ML  SOLN  COMPARISON:  05/06/2012.  FINDINGS: Lower chest: Patchy areas of ground-glass attenuation are noted in both lung bases. There is no pleural effusion identified.  Hepatobiliary: Diffuse hepatic steatosis noted. No suspicious liver abnormality. The gallbladder is normal. No biliary dilatation.  Pancreas: There is atrophy of the pancreas. Calcifications in the distribution of the pancreatic head noted.  Spleen: Negative  Adrenals/Urinary Tract: The adrenal glands are both normal. Chronic scarring involving the left kidney noted. Right kidney is normal. The urinary bladder is unremarkable.  Stomach/Bowel: The stomach is normal. The small bowel loops have a normal course and caliber. No obstruction. The appendix is visualized and appears normal. Numerous colonic diverticula noted. There is mild wall thickening involving the ascending colon, transverse colon and descending colon. No pneumatosis or evidence of bowel perforation. No obstructing mass noted.  Vascular/Lymphatic: Normal appearance of the abdominal aorta.  Reproductive: The uterus and the adnexal structures are unremarkable for patient's age.  Other: No free fluid or fluid collections within the abdomen or pelvis.  Musculoskeletal: The visualized bony structures are significant for mild lumbar degenerative disc disease.  IMPRESSION: 1. There is mild wall thickening involving the ascending colon, transverse colon and descending colon worrisome for uncomplicated colitis. No pneumatosis or evidence of bowel perforation. No abscess. 2. Hepatic steatosis. 3. Changes  within the pancreas compatible with chronic pancreatitis.   Electronically Signed   By: Kerby Moors M.D.   On: 04/14/2014 14:24    ENDOSCOPIC STUDIES: No endoscopic procedures in Epic.  IMPRESSION:   *  Bloody stools. CT scan showing colitis involving ascending, transverse, descending colon.  ? Infectious, ? Ischemic,  ?IBD/ulcerative colitis.    *  Fatty liver. Known alcohol abuse.  Transaminase pattern consistent with alcoholic hepatitis.  Continues to drink.   *  CT changes compatible with chronic pancreatitis.  Lipase currently normal  *  Thrombocytopenia.  Coags not compromised.  *      PLAN:     *  Will need colonoscopy once she is feeling better and can prep for this.  Ok to have clears.   *  Prophylactic PPI. Once daily.    Azucena Freed  04/14/2014, 4:00 PM Pager: [phone]

## 2014-04-14 NOTE — ED Provider Notes (Signed)
CSN: [id number]     Arrival date & time 04/14/14  1208 History   First MD Initiated Contact with Patient 04/14/14 1214     Chief Complaint  Patient presents with  . Rectal Bleeding  . Abdominal Pain     (Consider location/radiation/quality/duration/timing/severity/associated sxs/prior Treatment) Patient is a 54 y.o. female presenting with hematochezia and abdominal pain. The history is provided by the patient.  Rectal Bleeding Quality:  Maroon Amount:  Moderate Duration:  1 day Timing:  Constant Progression:  Unchanged Chronicity:  New Context: spontaneously   Similar prior episodes: yes   Relieved by:  Nothing Worsened by:  Nothing tried Ineffective treatments:  None tried Associated symptoms: abdominal pain   Associated symptoms: no dizziness, no fever and no vomiting   Abdominal Pain Associated symptoms: diarrhea and hematochezia   Associated symptoms: no chest pain, no cough, no dysuria, no fatigue, no fever, no hematuria, no nausea, no shortness of breath and no vomiting     Past Medical History  Diagnosis Date  . Seizures   . Asthma   . Hypertension   . GI bleed   . Pancreatitis    History reviewed. No pertinent past surgical history. Family History  Problem Relation Age of Onset  . Cancer Mother   . Cancer Brother    History  Substance Use Topics  . Smoking status: Never Smoker   . Smokeless tobacco: Never Used  . Alcohol Use: 6.0 oz/week    10 Cans of beer per week   OB History    No data available     Review of Systems  Constitutional: Negative for fever and fatigue.  HENT: Negative for congestion and drooling.   Eyes: Negative for pain.  Respiratory: Negative for cough and shortness of breath.   Cardiovascular: Negative for chest pain.  Gastrointestinal: Positive for abdominal pain, diarrhea and hematochezia. Negative for nausea and vomiting.  Genitourinary: Negative for dysuria and hematuria.       Bloody stools   Musculoskeletal: Negative  for back pain, gait problem and neck pain.  Skin: Negative for color change.  Neurological: Negative for dizziness and headaches.  Hematological: Negative for adenopathy.  Psychiatric/Behavioral: Negative for behavioral problems.  All other systems reviewed and are negative.     Allergies  Penicillins  Home Medications   Prior to Admission medications   Medication Sig Start Date End Date Taking? Authorizing Provider  albuterol (PROVENTIL HFA;VENTOLIN HFA) 108 (90 BASE) MCG/ACT inhaler Inhale 2 puffs into the lungs once. 07/06/12   Marin Olp, MD  lipase/protease/amylase (CREON-10/PANCREASE) 12000 UNITS CPEP Take 1 capsule by mouth 3 (three) times daily before meals. 05/11/12   Belkys A Regalado, MD  Multiple Vitamin (MULTIVITAMIN WITH MINERALS) TABS Take 1 tablet by mouth daily. 05/11/12   Belkys A Regalado, MD  phenytoin (DILANTIN) 100 MG ER capsule Take 3 capsules (300 mg total) by mouth at bedtime. 06/26/12   John Molpus, MD   BP 113/75 mmHg  Pulse 90  Temp(Src) 98.4 F (36.9 C) (Oral)  Resp 14  Ht 5\' 3"  (1.6 m)  Wt 124 lb (56.246 kg)  BMI 21.97 kg/m2  SpO2 98% Physical Exam  Constitutional: She is oriented to person, place, and time. She appears well-developed and well-nourished.  HENT:  Head: Normocephalic.  Mouth/Throat: Oropharynx is clear and moist. No oropharyngeal exudate.  Eyes: Conjunctivae and EOM are normal. Pupils are equal, round, and reactive to light.  Neck: Normal range of motion. Neck supple.  Cardiovascular: Normal rate, regular  rhythm, normal heart sounds and intact distal pulses.  Exam reveals no gallop and no friction rub.   No murmur heard. Pulmonary/Chest: Effort normal and breath sounds normal. No respiratory distress. She has no wheezes.  Abdominal: Soft. Bowel sounds are normal. There is no tenderness. There is no rebound and no guarding.  Small soft tissue mass noted in the epigastric area which is not tender to palpation on my exam.   Genitourinary:  Normal-appearing external rectum. Normal rectal exam without stool in the rectal vault. Dark blood noted in stool.  Musculoskeletal: Normal range of motion. She exhibits no edema or tenderness.  Neurological: She is alert and oriented to person, place, and time.  Skin: Skin is warm and dry.  Psychiatric: She has a normal mood and affect. Her behavior is normal.  Nursing note and vitals reviewed.   ED Course  Procedures (including critical care time) Labs Review Labs Reviewed  CBC WITH DIFFERENTIAL/PLATELET - Abnormal; Notable for the following:    RBC 2.65 (*)    Hemoglobin 8.7 (*)    HCT 26.2 (*)    Platelets 72 (*)    All other components within normal limits  COMPREHENSIVE METABOLIC PANEL - Abnormal; Notable for the following:    Glucose, Bld 100 (*)    Calcium 8.0 (*)    Total Protein 5.9 (*)    Albumin 3.2 (*)    AST 110 (*)    ALT 45 (*)    All other components within normal limits  LACTIC ACID, PLASMA - Abnormal; Notable for the following:    Lactic Acid, Venous 2.9 (*)    All other components within normal limits  URINALYSIS, ROUTINE W REFLEX MICROSCOPIC - Abnormal; Notable for the following:    Hgb urine dipstick SMALL (*)    All other components within normal limits  URINE MICROSCOPIC-ADD ON - Abnormal; Notable for the following:    Casts HYALINE CASTS (*)    All other components within normal limits  POC OCCULT BLOOD, ED - Abnormal; Notable for the following:    Fecal Occult Bld POSITIVE (*)    All other components within normal limits  PROTIME-INR  LIPASE, BLOOD  OCCULT BLOOD X 1 CARD TO LAB, STOOL  LACTIC ACID, PLASMA  ETHANOL  CBC  CBC  TYPE AND SCREEN  ABO/RH    Imaging Review Ct Abdomen Pelvis W Contrast  04/14/2014   CLINICAL DATA:  2-3 episodes of rectal bleeding.  Abdominal pain.  EXAM: CT ABDOMEN AND PELVIS WITH CONTRAST  TECHNIQUE: Multidetector CT imaging of the abdomen and pelvis was performed using the standard protocol  following bolus administration of intravenous contrast.  CONTRAST:  43mL OMNIPAQUE IOHEXOL 300 MG/ML  SOLN  COMPARISON:  05/06/2012.  FINDINGS: Lower chest: Patchy areas of ground-glass attenuation are noted in both lung bases. There is no pleural effusion identified.  Hepatobiliary: Diffuse hepatic steatosis noted. No suspicious liver abnormality. The gallbladder is normal. No biliary dilatation.  Pancreas: There is atrophy of the pancreas. Calcifications in the distribution of the pancreatic head noted.  Spleen: Negative  Adrenals/Urinary Tract: The adrenal glands are both normal. Chronic scarring involving the left kidney noted. Right kidney is normal. The urinary bladder is unremarkable.  Stomach/Bowel: The stomach is normal. The small bowel loops have a normal course and caliber. No obstruction. The appendix is visualized and appears normal. Numerous colonic diverticula noted. There is mild wall thickening involving the ascending colon, transverse colon and descending colon. No pneumatosis or evidence of bowel  perforation. No obstructing mass noted.  Vascular/Lymphatic: Normal appearance of the abdominal aorta.  Reproductive: The uterus and the adnexal structures are unremarkable for patient's age.  Other: No free fluid or fluid collections within the abdomen or pelvis.  Musculoskeletal: The visualized bony structures are significant for mild lumbar degenerative disc disease.  IMPRESSION: 1. There is mild wall thickening involving the ascending colon, transverse colon and descending colon worrisome for uncomplicated colitis. No pneumatosis or evidence of bowel perforation. No abscess. 2. Hepatic steatosis. 3. Changes within the pancreas compatible with chronic pancreatitis.   Electronically Signed   By: Kerby Moors M.D.   On: 04/14/2014 14:24     EKG Interpretation   Date/Time:  Thursday April 14 2014 13:01:57 EST Ventricular Rate:  72 PR Interval:  153 QRS Duration: 88 QT Interval:  465 QTC  Calculation: 509 R Axis:   57 Text Interpretation:  Sinus rhythm Borderline prolonged QT interval  Baseline wander in lead(s) V4 No significant change since last tracing  Confirmed by Jameel Quant  MD, Janeah Kovacich ([id number]) on 04/14/2014 1:26:06 PM      MDM   Final diagnoses:  Abdominal pain  Gastrointestinal hemorrhage, unspecified gastritis, unspecified gastrointestinal hemorrhage type    12:42 PM 54 y.o. female w hx of seizures, HTN, pancreatitis, etoh abuse who presents with 3 episodes of dark red bloody stools since yesterday. She also complains of a soft tissue mass in her epigastric area which has been there for 1 year and has become mildly tender over the last 2 days. She denies any vomiting or fever. She is afebrile and vital signs are unremarkable here. She does drink several beers daily. We'll get screening labs and imaging.  3:59 PM Case discussed w/ Azucena Freed PA w/ [facility]. Internal medicine teaching to admit.   Pamella Pert, MD 04/14/14 [phone]

## 2014-04-14 NOTE — ED Notes (Addendum)
Pt presents via [facility]- c/o of 2-3 episodes of rectal bleeding (dark blood) when having a BM, also abdominal pain and a "knot" in her abdomen.  Pt reports hx of GI bleed in the past.  BP-140/90 P-84 R-18 O2-97%.  Pt reports alcohol use today, 4 drinks. Pt a x 4, NAD, family at bedside. Pt also reports N/V.

## 2014-04-14 NOTE — ED Notes (Signed)
Dr. Aline Brochure made aware pt's BP.

## 2014-04-14 NOTE — Progress Notes (Signed)
CRITICAL VALUE ALERT  Critical value received:  Lactic Acid 2.5  Date of notification:  04/14/2014  Time of notification:  2203  Critical value read back:Yes.    Nurse who received alert:  Annice Pih  MD notified (1st page):Rushil Posey Pronto, MD  Time of first page:  2203, MD was on the unit, verbal notification  MD notified (2nd page):  Time of second page:  Responding MD:  Charlott Rakes, MD  Time MD responded:  2203

## 2014-04-14 NOTE — ED Notes (Signed)
Critical Lactic Acid 2.9. MD aware.

## 2014-04-15 DIAGNOSIS — K7010 Alcoholic hepatitis without ascites: Secondary | ICD-10-CM | POA: Diagnosis present

## 2014-04-15 DIAGNOSIS — K760 Fatty (change of) liver, not elsewhere classified: Secondary | ICD-10-CM | POA: Diagnosis present

## 2014-04-15 DIAGNOSIS — R109 Unspecified abdominal pain: Secondary | ICD-10-CM | POA: Diagnosis present

## 2014-04-15 DIAGNOSIS — E43 Unspecified severe protein-calorie malnutrition: Secondary | ICD-10-CM | POA: Diagnosis present

## 2014-04-15 DIAGNOSIS — K861 Other chronic pancreatitis: Secondary | ICD-10-CM | POA: Diagnosis present

## 2014-04-15 DIAGNOSIS — F1010 Alcohol abuse, uncomplicated: Secondary | ICD-10-CM | POA: Diagnosis present

## 2014-04-15 DIAGNOSIS — R933 Abnormal findings on diagnostic imaging of other parts of digestive tract: Secondary | ICD-10-CM | POA: Diagnosis present

## 2014-04-15 DIAGNOSIS — K860 Alcohol-induced chronic pancreatitis: Secondary | ICD-10-CM

## 2014-04-15 DIAGNOSIS — R1084 Generalized abdominal pain: Secondary | ICD-10-CM

## 2014-04-15 DIAGNOSIS — K922 Gastrointestinal hemorrhage, unspecified: Secondary | ICD-10-CM | POA: Diagnosis present

## 2014-04-15 DIAGNOSIS — K625 Hemorrhage of anus and rectum: Secondary | ICD-10-CM | POA: Diagnosis present

## 2014-04-15 DIAGNOSIS — K769 Liver disease, unspecified: Secondary | ICD-10-CM | POA: Diagnosis present

## 2014-04-15 LAB — CBC
HCT: 22.5 % — ABNORMAL LOW (ref 36.0–46.0)
HCT: 23.2 % — ABNORMAL LOW (ref 36.0–46.0)
Hemoglobin: 7.4 g/dL — ABNORMAL LOW (ref 12.0–15.0)
Hemoglobin: 7.9 g/dL — ABNORMAL LOW (ref 12.0–15.0)
MCH: 32.5 pg (ref 26.0–34.0)
MCH: 32.9 pg (ref 26.0–34.0)
MCHC: 32.9 g/dL (ref 30.0–36.0)
MCHC: 34.1 g/dL (ref 30.0–36.0)
MCV: 98.7 fL (ref 78.0–100.0)
MCV: 96.7 fL (ref 78.0–100.0)
Platelets: 65 10*3/uL — ABNORMAL LOW (ref 150–400)
Platelets: 75 10*3/uL — ABNORMAL LOW (ref 150–400)
RBC: 2.28 MIL/uL — ABNORMAL LOW (ref 3.87–5.11)
RBC: 2.4 MIL/uL — ABNORMAL LOW (ref 3.87–5.11)
RDW: 13.6 % (ref 11.5–15.5)
RDW: 13.2 % (ref 11.5–15.5)
WBC: 4.2 10*3/uL (ref 4.0–10.5)
WBC: 3.7 10*3/uL — ABNORMAL LOW (ref 4.0–10.5)

## 2014-04-15 LAB — COMPREHENSIVE METABOLIC PANEL
ALBUMIN: 3.4 g/dL — AB (ref 3.5–5.2)
ALK PHOS: 80 U/L (ref 39–117)
ALT: 51 U/L — AB (ref 0–35)
AST: 167 U/L — AB (ref 0–37)
Anion gap: 17 — ABNORMAL HIGH (ref 5–15)
BILIRUBIN TOTAL: 1.2 mg/dL (ref 0.3–1.2)
BUN: <5 mg/dL — ABNORMAL LOW (ref 6–23)
CO2: 17 mmol/L — AB (ref 19–32)
Calcium: 7.8 mg/dL — ABNORMAL LOW (ref 8.4–10.5)
Chloride: 107 mmol/L (ref 96–112)
Creatinine, Ser: 0.83 mg/dL (ref 0.50–1.10)
GFR calc Af Amer: >90 mL/min (ref >90)
GFR calc non Af Amer: 79 mL/min — ABNORMAL LOW (ref >90)
Glucose, Bld: 61 mg/dL — ABNORMAL LOW (ref 70–99)
Potassium: 4 mmol/L (ref 3.5–5.1)
Sodium: 141 mmol/L (ref 135–145)
Total Protein: 6.3 g/dL (ref 6.0–8.3)

## 2014-04-15 LAB — HEPATITIS PANEL, ACUTE
HCV AB: NEGATIVE
HEP A IGM: NONREACTIVE
Hep B C IgM: NONREACTIVE
Hepatitis B Surface Ag: NEGATIVE

## 2014-04-15 LAB — LACTIC ACID, PLASMA: Lactic Acid, Venous: 0.7 mmol/L (ref 0.5–2.0)

## 2014-04-15 LAB — CLOSTRIDIUM DIFFICILE BY PCR: CDIFFPCR: NEGATIVE

## 2014-04-15 LAB — HIV ANTIBODY (ROUTINE TESTING W REFLEX): HIV Screen 4th Generation wRfx: NONREACTIVE

## 2014-04-15 LAB — PHOSPHORUS: Phosphorus: 3.8 mg/dL (ref 2.3–4.6)

## 2014-04-15 LAB — MAGNESIUM: Magnesium: 1.3 mg/dL — ABNORMAL LOW (ref 1.5–2.5)

## 2014-04-15 MED ORDER — BOOST / RESOURCE BREEZE PO LIQD
1.0000 | Freq: Three times a day (TID) | ORAL | Status: DC
  Administered 2014-04-15 – 2014-04-16 (×4): 1 via ORAL

## 2014-04-15 MED ORDER — ACETAMINOPHEN 325 MG PO TABS
650.0000 mg | ORAL_TABLET | Freq: Once | ORAL | Status: AC
  Administered 2014-04-15: 650 mg via ORAL
  Filled 2014-04-15: qty 2

## 2014-04-15 MED ORDER — MAGNESIUM OXIDE 400 (241.3 MG) MG PO TABS
400.0000 mg | ORAL_TABLET | Freq: Once | ORAL | Status: AC
  Administered 2014-04-15: 400 mg via ORAL
  Filled 2014-04-15: qty 1

## 2014-04-15 MED ORDER — DEXTROSE-NACL 5-0.9 % IV SOLN
Freq: Once | INTRAVENOUS | Status: AC
  Administered 2014-04-15: 06:00:00 via INTRAVENOUS

## 2014-04-15 MED ORDER — DEXTROSE 50 % IV SOLN
1.0000 | Freq: Once | INTRAVENOUS | Status: AC
  Administered 2014-04-15: 50 mL via INTRAVENOUS
  Filled 2014-04-15: qty 50

## 2014-04-15 MED ORDER — PANTOPRAZOLE SODIUM 40 MG PO TBEC
40.0000 mg | DELAYED_RELEASE_TABLET | Freq: Every day | ORAL | Status: DC
  Administered 2014-04-15 – 2014-04-16 (×2): 40 mg via ORAL
  Filled 2014-04-15 (×2): qty 1

## 2014-04-15 MED ORDER — MAGNESIUM SULFATE 2 GM/50ML IV SOLN
2.0000 g | Freq: Once | INTRAVENOUS | Status: AC
  Administered 2014-04-15: 2 g via INTRAVENOUS
  Filled 2014-04-15: qty 50

## 2014-04-15 MED ORDER — DEXTROSE 50 % IV SOLN
INTRAVENOUS | Status: AC
  Filled 2014-04-15: qty 50

## 2014-04-15 NOTE — Progress Notes (Signed)
Subjective: Pt has no complaints this morning, feels a lot better. Had 1 bloody bowel movement this morning.  Objective: Vital signs in last 24 hours: Filed Vitals:   04/14/14 2030 04/14/14 2328 04/15/14 0300 04/15/14 0755  BP:  144/83 140/76 123/76  Pulse:  126 93 91  Temp: 97.2 F (36.2 C) 97.4 F (36.3 C) 99.2 F (37.3 C) 99.6 F (37.6 C)  TempSrc: Oral Oral Oral Oral  Resp:  24 18 17   Height:      Weight:      SpO2:  100% 99%    Weight change:   Intake/Output Summary (Last 24 hours) at 04/15/14 0808 Last data filed at 04/15/14 0600  Gross per 24 hour  Intake 1707.5 ml  Output   2150 ml  Net -442.5 ml   General: NAD, laying in bed comfortably Lungs: CTAB, no wheezing Cardiac: RRR, no murmurs GI: soft, active bowel sounds, non TTP Neuro: CN II-XII grossly intact  Lab Results: Basic Metabolic Panel:  Recent Labs Lab 04/14/14 1300 04/15/14 0355 04/15/14 0521  NA 139 141  --   K 3.5 4.0  --   CL 104 107  --   CO2 23 17*  --   GLUCOSE 100* 61*  --   BUN 7 <5*  --   CREATININE 0.68 0.83  --   CALCIUM 8.0* 7.8*  --   MG  --   --  1.3*  PHOS  --   --  3.8   Liver Function Tests:  Recent Labs Lab 04/14/14 1300 04/15/14 0355  AST 110* 167*  ALT 45* 51*  ALKPHOS 74 80  BILITOT 0.4 1.2  PROT 5.9* 6.3  ALBUMIN 3.2* 3.4*    Recent Labs Lab 04/14/14 1300  LIPASE 17   CBC:  Recent Labs Lab 04/14/14 1300 04/14/14 2037  WBC 6.3 4.6  NEUTROABS 2.8  --   HGB 8.7* 7.6*  HCT 26.2* 22.7*  MCV 98.9 97.0  PLT 72* 65*   Coagulation:  Recent Labs Lab 04/14/14 1300  LABPROT 14.2  INR 1.09   Urine Drug Screen: Drugs of Abuse     Component Value Date/Time   LABOPIA NONE DETECTED 06/26/2012 0134   LABOPIA NEG 05/31/2009 2246   COCAINSCRNUR NONE DETECTED 06/26/2012 0134   COCAINSCRNUR POS* 05/31/2009 2246   LABBENZ NONE DETECTED 06/26/2012 0134   LABBENZ NEG 05/31/2009 2246   AMPHETMU NONE DETECTED 06/26/2012 0134   AMPHETMU NEG  05/31/2009 2246   THCU NONE DETECTED 06/26/2012 0134   LABBARB NONE DETECTED 06/26/2012 0134    Alcohol Level:  Recent Labs Lab 04/14/14 1712  ETH 145*   Urinalysis:  Recent Labs Lab 04/14/14 1419  COLORURINE YELLOW  LABSPEC 1.026  PHURINE 5.5  GLUCOSEU NEGATIVE  HGBUR SMALL*  BILIRUBINUR NEGATIVE  KETONESUR NEGATIVE  PROTEINUR NEGATIVE  UROBILINOGEN 0.2  NITRITE NEGATIVE  LEUKOCYTESUR NEGATIVE    Micro Results: Recent Results (from the past 240 hour(s))  MRSA PCR Screening     Status: None   Collection Time: 04/14/14  6:50 PM  Result Value Ref Range Status   MRSA by PCR NEGATIVE NEGATIVE Final    Comment:        The GeneXpert MRSA Assay (FDA approved for NASAL specimens only), is one component of a comprehensive MRSA colonization surveillance program. It is not intended to diagnose MRSA infection nor to guide or monitor treatment for MRSA infections.    Studies/Results: Ct Abdomen Pelvis W Contrast  04/14/2014   CLINICAL  DATA:  2-3 episodes of rectal bleeding.  Abdominal pain.  EXAM: CT ABDOMEN AND PELVIS WITH CONTRAST  TECHNIQUE: Multidetector CT imaging of the abdomen and pelvis was performed using the standard protocol following bolus administration of intravenous contrast.  CONTRAST:  69mL OMNIPAQUE IOHEXOL 300 MG/ML  SOLN  COMPARISON:  05/06/2012.  FINDINGS: Lower chest: Patchy areas of ground-glass attenuation are noted in both lung bases. There is no pleural effusion identified.  Hepatobiliary: Diffuse hepatic steatosis noted. No suspicious liver abnormality. The gallbladder is normal. No biliary dilatation.  Pancreas: There is atrophy of the pancreas. Calcifications in the distribution of the pancreatic head noted.  Spleen: Negative  Adrenals/Urinary Tract: The adrenal glands are both normal. Chronic scarring involving the left kidney noted. Right kidney is normal. The urinary bladder is unremarkable.  Stomach/Bowel: The stomach is normal. The small bowel  loops have a normal course and caliber. No obstruction. The appendix is visualized and appears normal. Numerous colonic diverticula noted. There is mild wall thickening involving the ascending colon, transverse colon and descending colon. No pneumatosis or evidence of bowel perforation. No obstructing mass noted.  Vascular/Lymphatic: Normal appearance of the abdominal aorta.  Reproductive: The uterus and the adnexal structures are unremarkable for patient's age.  Other: No free fluid or fluid collections within the abdomen or pelvis.  Musculoskeletal: The visualized bony structures are significant for mild lumbar degenerative disc disease.  IMPRESSION: 1. There is mild wall thickening involving the ascending colon, transverse colon and descending colon worrisome for uncomplicated colitis. No pneumatosis or evidence of bowel perforation. No abscess. 2. Hepatic steatosis. 3. Changes within the pancreas compatible with chronic pancreatitis.   Electronically Signed   By: Kerby Moors M.D.   On: 04/14/2014 14:24   Medications: I have reviewed the patient's current medications. Scheduled Meds: . folic acid  1 mg Oral Daily  . Influenza vac split quadrivalent PF  0.5 mL Intramuscular Tomorrow-1000  . lipase/protease/amylase  1 capsule Oral TID AC  . magnesium sulfate 1 - 4 g bolus IVPB  2 g Intravenous Once  . multivitamin with minerals  1 tablet Oral Daily  . pantoprazole (PROTONIX) IV  40 mg Intravenous Q12H  . phenytoin  300 mg Oral QHS  . thiamine  100 mg Oral Daily   Or  . thiamine  100 mg Intravenous Daily   Continuous Infusions:  PRN Meds:.LORazepam **OR** LORazepam Assessment/Plan: Principal Problem:   GI bleed Active Problems:   Seizure disorder   Hepatic steatosis   Alcohol abuse   Chronic pancreatitis   GI bleed--lactic acid 2.9 on admission trended town to 0.7 this morning. hgb 7.6 from 8.7 this morning, likely dilutional from IVFs. BP 123/73. - d/c'd NS at 150cc/hr - pt typed and  screened - GI saw patient last night, ordered GI panel and Cdiff. Possibly colitis, recommend supportive care with eventual colonoscopy.  - IV protonix 40mg  changed to po protonix 40mg  - can transfer out of step down this afternoon  Alcohol abuse- last drink was last night. AST/ALT >2. CIWA ranged from 6-10 overnight, 6 is the most recent score - ethanol level 145 - CIWA protocol - thiamine, folate, MVI - hepatitis panel negative - HIV pending  Seizure d/o-- seen by Dr. Jannifer Franklin with [facility]. - continue home dilantin 300mg  qhs  Chronic pancreatitis-- lipase WNL at 17 - continue home creon  FEN - clear liquid diet - mag 1.3, will replete with 400mg  mag ox  DVT ppx-- SCDs  Dispo: Disposition is deferred at  this time, awaiting improvement of current medical problems. Anticipated discharge in approximately 2-3 day(s).   The patient does have a current PCP (Dr. Santiago Glad) and does not need an [facility] hospital follow-up appointment after discharge.  The patient does not have transportation limitations that hinder transportation to clinic appointments.  .Services Needed at time of discharge: Y = Yes, Blank = No PT:   OT:   RN:   Equipment:   Other:     LOS: 1 day   Norman Herrlich, MD 04/15/2014, 8:08 AM

## 2014-04-15 NOTE — Progress Notes (Signed)
Pt had orders to go to [place] room 36 on telemetry per md orders. Called and notified central telemetry. Pt is alert an oriented x 4 and in no distress on departure. Gave report to [place]. Nurse tech taking pt to floor now.

## 2014-04-15 NOTE — Progress Notes (Signed)
Utilization Review Completed.  

## 2014-04-15 NOTE — Progress Notes (Signed)
INITIAL NUTRITION ASSESSMENT  DOCUMENTATION CODES Per approved criteria  -Severe malnutrition in the context of chronic illness   Pt meets criteria for severe MALNUTRITION in the context of chronic illness as evidenced by intake </= 75% of estimated energy requirement for >/= 1 month with severe depletion of muscle mass.  INTERVENTION:  Resource Breeze PO TID, each supplement provides 250 kcal and 9 grams of protein.  NUTRITION DIAGNOSIS: Malnutrition related to chronic illness with inadequate oral intake as evidenced by intake </= 75% of estimated energy requirement for >/= 1 month with severe depletion of muscle mass.   Goal: Intake to meet >90% of estimated nutrition needs.  Monitor:  Diet advancement, PO intake, labs, weight trend.  Reason for Assessment: Malnutrition Screening Tool  54 y.o. female  Admitting Dx: GI bleed  ASSESSMENT: Patient presented to the ED on 3/10 with hematochezia that started the previous evening. Hx of asthma, HTN, and alcoholism. CT of abd/pelvis obtained while in the ED revealed possible uncomplicated colitis, hepatic steatosis, and chronic pancreatitis. She takes pancrease TID at home.  Patient reports that she has lost some weight over the past few years. She weighed 142 lbs 2 years ago. Per H&P she has lost 30 lbs over the past 6 months. Patient says she doesn't feel like eating much at home. With history of alcohol abuse, patient is at nutrition risk. She is on clear liquids at this time. Agreed to try [employer] supplement between meals.  Nutrition Focused Physical Exam:  Subcutaneous Fat:  Orbital Region: moderate depletion Upper Arm Region: mild depletion Thoracic and Lumbar Region: mild depletion  Muscle:  Temple Region: WNL Clavicle Bone Region: severe depletion Clavicle and Acromion Bone Region: moderate depletion Scapular Bone Region: moderate depletion Dorsal Hand: moderate depletion Patellar Region: WNL Anterior Thigh  Region: WNL Posterior Calf Region: mild depletion  Edema: none    Height: Ht Readings from Last 1 Encounters:  04/14/14 5\' 3"  (1.6 m)    Weight: Wt Readings from Last 1 Encounters:  04/14/14 124 lb (56.246 kg)    Ideal Body Weight: 52.3 kg  % Ideal Body Weight: 108%  Wt Readings from Last 10 Encounters:  04/14/14 124 lb (56.246 kg)  02/04/14 124 lb (56.246 kg)  05/20/12 124 lb (56.246 kg)  05/07/12 123 lb 0.3 oz (55.8 kg)    Usual Body Weight: 142 lbs per patient a few years ago  % Usual Body Weight: 87%  BMI:  Body mass index is 21.97 kg/(m^2).  Estimated Nutritional Needs: Kcal: 1700-1900 Protein: 85-95 gm Fluid: 1.8 L  Skin: WDL  Diet Order: Diet clear liquid  EDUCATION NEEDS: -Education not appropriate at this time   Intake/Output Summary (Last 24 hours) at 04/15/14 1111 Last data filed at 04/15/14 0840  Gross per 24 hour  Intake 1707.5 ml  Output   2500 ml  Net -792.5 ml    Last BM: 3/10   Labs:   Recent Labs Lab 04/14/14 1300 04/15/14 0355 04/15/14 0521  NA 139 141  --   K 3.5 4.0  --   CL 104 107  --   CO2 23 17*  --   BUN 7 <5*  --   CREATININE 0.68 0.83  --   CALCIUM 8.0* 7.8*  --   MG  --   --  1.3*  PHOS  --   --  3.8  GLUCOSE 100* 61*  --     CBG (last 3)  No results for input(s): GLUCAP in the  last 72 hours.  Scheduled Meds: . folic acid  1 mg Oral Daily  . Influenza vac split quadrivalent PF  0.5 mL Intramuscular Tomorrow-1000  . lipase/protease/amylase  1 capsule Oral TID AC  . magnesium oxide  400 mg Oral Once  . multivitamin with minerals  1 tablet Oral Daily  . pantoprazole  40 mg Oral Daily  . phenytoin  300 mg Oral QHS  . thiamine  100 mg Oral Daily   Or  . thiamine  100 mg Intravenous Daily    Continuous Infusions:   Past Medical History  Diagnosis Date  . Asthma   . Hypertension   . Pancreatitis     Chronic pancreatitis noted on CT scan in 04/2014.  . Colitis 04/2014.    Bloody diarrhea  .  Alcoholism 2011  . Polysubstance abuse 2011  . Pyelonephritis 05/2012  . GIB (gastrointestinal bleeding)   . Daily headache   . Seizures     "don't know what they are from" (04/14/2014)  . Chronic lower back pain     Past Surgical History  Procedure Laterality Date  . No past surgeries      Molli Barrows, RD, LDN, [place] Pager [phone] After Hours Pager [phone]

## 2014-04-15 NOTE — Progress Notes (Signed)
Daily Rounding Note  04/15/2014, 8:30 AM  LOS: 1 day   SUBJECTIVE:       Stools less frequent, one early this AM was smaller volume but still bloody.  Pt confirms: less pain and less frequent stools.  feeling a lot better.  No nausea but not that hungry.    OBJECTIVE:         Vital signs in last 24 hours:    Temp:  [97.2 F (36.2 C)-99.6 F (37.6 C)] 99.6 F (37.6 C) (03/11 0755) Pulse Rate:  [68-126] 91 (03/11 0755) Resp:  [13-24] 17 (03/11 0755) BP: (84-144)/(49-83) 123/76 mmHg (03/11 0755) SpO2:  [97 %-100 %] 99 % (03/11 0300) Weight:  [124 lb (56.246 kg)] 124 lb (56.246 kg) (03/10 1212) Last BM Date: 04/14/14 Filed Weights   04/14/14 1212  Weight: 124 lb (56.246 kg)   General: looks better.  Not toxic, less lethargic   Heart: RRR Chest: clear bil.  No dyspnea or cough Abdomen: soft, NT, ND.  Active BS  Extremities: no CCE Neuro/Psych:  Pleasant, relaxed, oriented x3.  Fully alert.  Intake/Output from previous day: 03/10 0701 - 03/11 0700 In: 1707.5 [I.V.:1707.5] Out: 2150 [Urine:2150]  Intake/Output this shift:    Lab Results:  Recent Labs  04/14/14 1300 04/14/14 2037  WBC 6.3 4.6  HGB 8.7* 7.6*  HCT 26.2* 22.7*  PLT 72* 65*   BMET  Recent Labs  04/14/14 1300 04/15/14 0355  NA 139 141  K 3.5 4.0  CL 104 107  CO2 23 17*  GLUCOSE 100* 61*  BUN 7 <5*  CREATININE 0.68 0.83  CALCIUM 8.0* 7.8*   LFT  Recent Labs  04/14/14 1300 04/15/14 0355  PROT 5.9* 6.3  ALBUMIN 3.2* 3.4*  AST 110* 167*  ALT 45* 51*  ALKPHOS 74 80  BILITOT 0.4 1.2   PT/INR  Recent Labs  04/14/14 1300  LABPROT 14.2  INR 1.09   Hepatitis Panel No results for input(s): HEPBSAG, HCVAB, HEPAIGM, HEPBIGM in the last 72 hours.  Studies/Results: Ct Abdomen Pelvis W Contrast  04/14/2014   CLINICAL DATA:  2-3 episodes of rectal bleeding.  Abdominal pain.  EXAM: CT ABDOMEN AND PELVIS WITH CONTRAST  TECHNIQUE:  Multidetector CT imaging of the abdomen and pelvis was performed using the standard protocol following bolus administration of intravenous contrast.  CONTRAST:  52mL OMNIPAQUE IOHEXOL 300 MG/ML  SOLN  COMPARISON:  05/06/2012.  FINDINGS: Lower chest: Patchy areas of ground-glass attenuation are noted in both lung bases. There is no pleural effusion identified.  Hepatobiliary: Diffuse hepatic steatosis noted. No suspicious liver abnormality. The gallbladder is normal. No biliary dilatation.  Pancreas: There is atrophy of the pancreas. Calcifications in the distribution of the pancreatic head noted.  Spleen: Negative  Adrenals/Urinary Tract: The adrenal glands are both normal. Chronic scarring involving the left kidney noted. Right kidney is normal. The urinary bladder is unremarkable.  Stomach/Bowel: The stomach is normal. The small bowel loops have a normal course and caliber. No obstruction. The appendix is visualized and appears normal. Numerous colonic diverticula noted. There is mild wall thickening involving the ascending colon, transverse colon and descending colon. No pneumatosis or evidence of bowel perforation. No obstructing mass noted.  Vascular/Lymphatic: Normal appearance of the abdominal aorta.  Reproductive: The uterus and the adnexal structures are unremarkable for patient's age.  Other: No free fluid or fluid collections within the abdomen or pelvis.  Musculoskeletal: The visualized bony structures are  significant for mild lumbar degenerative disc disease.  IMPRESSION: 1. There is mild wall thickening involving the ascending colon, transverse colon and descending colon worrisome for uncomplicated colitis. No pneumatosis or evidence of bowel perforation. No abscess. 2. Hepatic steatosis. 3. Changes within the pancreas compatible with chronic pancreatitis.   Electronically Signed   By: Kerby Moors M.D.   On: 04/14/2014 14:24    ASSESMENT:   *  Pan colitis with abd pain, bloody stool. GI path  panel in process.  Clinically improved.   *  Alcoholism.    *  Chronic pancreatitis, longstanding. On pancrease PTA.   *  Fatty liver, long standing. Transaminases c/w etoh hepatitis.   *  Thrombocytopenia, longstanding.    *  Normocytic anemia.    PLAN   *  D/c the q 6 hour cbc, check it in AM.  Start clears as per suggestion 3/10  *  Timing of colonoscopy?  *  Already know and have POCT noting she is FOBT +, so do not need to repeat this.    *  Would transfer to non-tele floor.       Azucena Freed  04/15/2014, 8:30 AM Pager: [phone]

## 2014-04-16 DIAGNOSIS — K625 Hemorrhage of anus and rectum: Secondary | ICD-10-CM

## 2014-04-16 DIAGNOSIS — K861 Other chronic pancreatitis: Secondary | ICD-10-CM

## 2014-04-16 DIAGNOSIS — D696 Thrombocytopenia, unspecified: Secondary | ICD-10-CM

## 2014-04-16 DIAGNOSIS — F1010 Alcohol abuse, uncomplicated: Secondary | ICD-10-CM

## 2014-04-16 DIAGNOSIS — G40909 Epilepsy, unspecified, not intractable, without status epilepticus: Secondary | ICD-10-CM

## 2014-04-16 DIAGNOSIS — K922 Gastrointestinal hemorrhage, unspecified: Secondary | ICD-10-CM

## 2014-04-16 DIAGNOSIS — K7010 Alcoholic hepatitis without ascites: Secondary | ICD-10-CM

## 2014-04-16 DIAGNOSIS — E876 Hypokalemia: Secondary | ICD-10-CM

## 2014-04-16 DIAGNOSIS — E43 Unspecified severe protein-calorie malnutrition: Secondary | ICD-10-CM

## 2014-04-16 LAB — CBC
HCT: 23.7 % — ABNORMAL LOW (ref 36.0–46.0)
Hemoglobin: 7.8 g/dL — ABNORMAL LOW (ref 12.0–15.0)
MCH: 32.2 pg (ref 26.0–34.0)
MCHC: 32.9 g/dL (ref 30.0–36.0)
MCV: 97.9 fL (ref 78.0–100.0)
PLATELETS: 79 10*3/uL — AB (ref 150–400)
RBC: 2.42 MIL/uL — AB (ref 3.87–5.11)
RDW: 13 % (ref 11.5–15.5)
WBC: 4.4 10*3/uL (ref 4.0–10.5)

## 2014-04-16 LAB — BASIC METABOLIC PANEL
Anion gap: 11 (ref 5–15)
BUN: <5 mg/dL — ABNORMAL LOW (ref 6–23)
CALCIUM: 8.5 mg/dL (ref 8.4–10.5)
CHLORIDE: 102 mmol/L (ref 96–112)
CO2: 26 mmol/L (ref 19–32)
Creatinine, Ser: 0.53 mg/dL (ref 0.50–1.10)
Glucose, Bld: 84 mg/dL (ref 70–99)
Potassium: 3.4 mmol/L — ABNORMAL LOW (ref 3.5–5.1)
Sodium: 139 mmol/L (ref 135–145)

## 2014-04-16 MED ORDER — POTASSIUM CHLORIDE 20 MEQ/15ML (10%) PO SOLN
40.0000 meq | Freq: Once | ORAL | Status: AC
  Administered 2014-04-16: 40 meq via ORAL
  Filled 2014-04-16: qty 30

## 2014-04-16 MED ORDER — ACETAMINOPHEN 325 MG PO TABS
650.0000 mg | ORAL_TABLET | Freq: Once | ORAL | Status: AC
  Administered 2014-04-16: 650 mg via ORAL
  Filled 2014-04-16: qty 2

## 2014-04-16 MED ORDER — BOOST / RESOURCE BREEZE PO LIQD: 1.0000 | Freq: Three times a day (TID) | ORAL | Status: DC

## 2014-04-16 MED ORDER — FOLIC ACID 1 MG PO TABS: 1.0000 mg | ORAL_TABLET | Freq: Every day | ORAL | Status: DC

## 2014-04-16 MED ORDER — THIAMINE HCL 100 MG PO TABS: 100.0000 mg | ORAL_TABLET | Freq: Every day | ORAL | Status: DC

## 2014-04-16 NOTE — Progress Notes (Signed)
No bloody BMs noted overnight.

## 2014-04-16 NOTE — Progress Notes (Signed)
[facility] Progress Note  Subjective:  Feels well. Had a formed BM last pm with no blood. GI pathogen panel pending. Denies abd pain.No Nausea. Wants to eat. C diff neg.   Objective:  Vital signs in last 24 hours: Temp:  [99 F (37.2 C)-100 F (37.8 C)] 99.4 F (37.4 C) (03/12 0621) Pulse Rate:  [88-125] 110 (03/12 0621) Resp:  [9-27] 20 (03/12 0621) BP: (125-166)/(77-109) 144/96 mmHg (03/12 0621) SpO2:  [99 %-100 %] 100 % (03/12 0621) Weight:  [127 lb 3.2 oz (57.698 kg)] 127 lb 3.2 oz (57.698 kg) (03/11 1835) Last BM Date: 04/15/14 General:   Alert,  Well-developed,  in NAD Heart:  Regular rate and rhythm; no murmurs Pulm;lungs clear Abdomen:  Soft, nontender and nondistended. Normal bowel sounds, without guarding, and without rebound.   Extremities:  Without edema. Neurologic:  Alert and  oriented x4;  grossly normal neurologically. Psych:  Alert and cooperative. Normal mood and affect.  Intake/Output from previous day: 03/11 0701 - 03/12 0700 In: 1287 [P.O.:1237; IV Piggyback:50] Out: 1800 [Urine:1800] Intake/Output this shift:    Lab Results:  Recent Labs  04/14/14 2037 04/15/14 1920 04/16/14 0616  WBC 4.6 3.7* 4.4  HGB 7.6* 7.9* 7.8*  HCT 22.7* 23.2* 23.7*  PLT 65* 75* 79*   BMET  Recent Labs  04/14/14 1300 04/15/14 0355 04/16/14 0616  NA 139 141 139  K 3.5 4.0 3.4*  CL 104 107 102  CO2 23 17* 26  GLUCOSE 100* 61* 84  BUN 7 <5* <5*  CREATININE 0.68 0.83 0.53  CALCIUM 8.0* 7.8* 8.5   LFT  Recent Labs  04/15/14 0355  PROT 6.3  ALBUMIN 3.4*  AST 167*  ALT 51*  ALKPHOS 80  BILITOT 1.2   PT/INR  Recent Labs  04/14/14 1300  LABPROT 14.2  INR 1.09   Hepatitis Panel  Recent Labs  04/15/14 0521  HEPBSAG NEGATIVE  HCVAB NEGATIVE  HEPAIGM NON REACTIVE  HEPBIGM NON REACTIVE    Ct Abdomen Pelvis W Contrast  04/14/2014   CLINICAL DATA:  2-3 episodes of rectal bleeding.  Abdominal pain.  EXAM: CT ABDOMEN AND  PELVIS WITH CONTRAST  TECHNIQUE: Multidetector CT imaging of the abdomen and pelvis was performed using the standard protocol following bolus administration of intravenous contrast.  CONTRAST:  20mL OMNIPAQUE IOHEXOL 300 MG/ML  SOLN  COMPARISON:  05/06/2012.  FINDINGS: Lower chest: Patchy areas of ground-glass attenuation are noted in both lung bases. There is no pleural effusion identified.  Hepatobiliary: Diffuse hepatic steatosis noted. No suspicious liver abnormality. The gallbladder is normal. No biliary dilatation.  Pancreas: There is atrophy of the pancreas. Calcifications in the distribution of the pancreatic head noted.  Spleen: Negative  Adrenals/Urinary Tract: The adrenal glands are both normal. Chronic scarring involving the left kidney noted. Right kidney is normal. The urinary bladder is unremarkable.  Stomach/Bowel: The stomach is normal. The small bowel loops have a normal course and caliber. No obstruction. The appendix is visualized and appears normal. Numerous colonic diverticula noted. There is mild wall thickening involving the ascending colon, transverse colon and descending colon. No pneumatosis or evidence of bowel perforation. No obstructing mass noted.  Vascular/Lymphatic: Normal appearance of the abdominal aorta.  Reproductive: The uterus and the adnexal structures are unremarkable for patient's age.  Other: No free fluid or fluid collections within the abdomen or pelvis.  Musculoskeletal: The visualized bony structures are significant for mild lumbar degenerative disc disease.  IMPRESSION: 1. There is  mild wall thickening involving the ascending colon, transverse colon and descending colon worrisome for uncomplicated colitis. No pneumatosis or evidence of bowel perforation. No abscess. 2. Hepatic steatosis. 3. Changes within the pancreas compatible with chronic pancreatitis.   Electronically Signed   By: Kerby Moors M.D.   On: 04/14/2014 14:24    ASSESSMENT/PLAN:   * Pan colitis  with abd pain, bloody stool. GI path panel in process.  Clinically improved. GI Pathogen panel pending, c diff neg. Will check with Dr Hilarie Fredrickson YP:PJKDTO of colonoscopy.  * Alcoholism.   * Chronic pancreatitis, longstanding. On pancrease PTA.   * Fatty liver, long standing. Transaminases c/w etoh hepatitis.   * Thrombocytopenia, longstanding.   * Normocytic anemia.      LOS: 2 days   Brandy Bishop, Vita Barley PA-C 04/16/2014, Pager [phone]

## 2014-04-16 NOTE — Discharge Summary (Signed)
Name: Brandy Bishop MRN: [id number] DOB: 1961-01-31 54 y.o. PCP: No Pcp Per Patient  Date of Admission: 04/14/2014 12:08 PM Date of Discharge: 04/16/2014 Attending Physician: Truman Hayward, MD  Discharge Diagnosis: Principal Problem:   GI bleed Active Problems:   Seizure disorder   Hepatic steatosis   Alcohol abuse   Chronic pancreatitis   Protein-calorie malnutrition, severe   Bleeding gastrointestinal   Abnormal CT scan, colon   Rectal bleeding   Abdominal pain   Alcoholic hepatitis without ascites   Severe protein-calorie malnutrition   Hypomagnesemia   Hypokalemia   Thrombocytopenia  Discharge Medications:   Medication List    TAKE these medications        albuterol 108 (90 BASE) MCG/ACT inhaler  Commonly known as:  PROVENTIL HFA;VENTOLIN HFA  Inhale 2 puffs into the lungs once.     atenolol 50 MG tablet  Commonly known as:  TENORMIN  Take 50 mg by mouth daily.     feeding supplement (RESOURCE BREEZE) Liqd  Take 1 Container by mouth 3 (three) times daily between meals.     folic acid 1 MG tablet  Commonly known as:  FOLVITE  Take 1 tablet (1 mg total) by mouth daily.     lipase/protease/amylase 12000 UNITS Cpep capsule  Commonly known as:  CREON  Take 1 capsule by mouth 3 (three) times daily before meals.     multivitamin with minerals Tabs tablet  Take 1 tablet by mouth daily.     phenytoin 100 MG ER capsule  Commonly known as:  DILANTIN  Take 3 capsules (300 mg total) by mouth at bedtime.     thiamine 100 MG tablet  Take 1 tablet (100 mg total) by mouth daily.        Disposition and follow-up:   Brandy Bishop was discharged from [facility] in Stable condition.  At the hospital follow up visit please address:  1.  Colitis: Patient with findings of pan colitis on Abdominal CT, plan to follow up GI pathogen panel and patient to follow up with GI for possible colonoscopy.  2. Normocytic Anemia and  thrombocytopenia: please follow up a CBC  2.  Labs / imaging needed at time of follow-up: CBC, Anemia panel BMP, magnesium  3.  Pending labs/ test needing follow-up: GI pathogen panel  Follow-up Appointments:     Follow-up Information    Follow up with PYRTLE, Lajuan Lines, MD. Schedule an appointment as soon as possible for a visit in 2 weeks.   Specialty:  Gastroenterology   Contact information:   [address] [place] [address] [phone]       Follow up with [place]    . Schedule an appointment as soon as possible for a visit in 2 weeks.   Why:  to establish with a primary care physician.   Contact information:   [address] [place] [address] [phone]      Discharge Instructions: Discharge Instructions    Call MD for:  extreme fatigue    Complete by:  As directed      Call MD for:    Complete by:  As directed   Blood in stool     Diet - low sodium heart healthy    Complete by:  As directed      Discharge instructions    Complete by:  As directed   Dr. Venita Sheffield office will contact you  within 2 weeks to schedule a colonoscopy if you do not hear from them please give them a call.     Increase activity slowly    Complete by:  As directed            Consultations:    Procedures Performed:  Ct Abdomen Pelvis W Contrast  04/14/2014   CLINICAL DATA:  2-3 episodes of rectal bleeding.  Abdominal pain.  EXAM: CT ABDOMEN AND PELVIS WITH CONTRAST  TECHNIQUE: Multidetector CT imaging of the abdomen and pelvis was performed using the standard protocol following bolus administration of intravenous contrast.  CONTRAST:  73mL OMNIPAQUE IOHEXOL 300 MG/ML  SOLN  COMPARISON:  05/06/2012.  FINDINGS: Lower chest: Patchy areas of ground-glass attenuation are noted in both lung bases. There is no pleural effusion identified.  Hepatobiliary: Diffuse hepatic steatosis noted. No suspicious liver abnormality. The gallbladder is  normal. No biliary dilatation.  Pancreas: There is atrophy of the pancreas. Calcifications in the distribution of the pancreatic head noted.  Spleen: Negative  Adrenals/Urinary Tract: The adrenal glands are both normal. Chronic scarring involving the left kidney noted. Right kidney is normal. The urinary bladder is unremarkable.  Stomach/Bowel: The stomach is normal. The small bowel loops have a normal course and caliber. No obstruction. The appendix is visualized and appears normal. Numerous colonic diverticula noted. There is mild wall thickening involving the ascending colon, transverse colon and descending colon. No pneumatosis or evidence of bowel perforation. No obstructing mass noted.  Vascular/Lymphatic: Normal appearance of the abdominal aorta.  Reproductive: The uterus and the adnexal structures are unremarkable for patient's age.  Other: No free fluid or fluid collections within the abdomen or pelvis.  Musculoskeletal: The visualized bony structures are significant for mild lumbar degenerative disc disease.  IMPRESSION: 1. There is mild wall thickening involving the ascending colon, transverse colon and descending colon worrisome for uncomplicated colitis. No pneumatosis or evidence of bowel perforation. No abscess. 2. Hepatic steatosis. 3. Changes within the pancreas compatible with chronic pancreatitis.   Electronically Signed   By: Kerby Moors M.D.   On: 04/14/2014 14:24    Admission HPI: Pt is a 54 y/o female with PMHx of seizures, asthma, HTN, and alcoholism who presents with hematochezia that started yesterday evening. Pt had 2 bloody bowel movements yesterday and then one today in the ED. Pt states that her stools were brown and the toilet bowl water was red. Since last night pt has had nausea but no vomiting, abdominal pain, and decreased appetite. She has had fatigue x 1 month and abdominal pain x 1 week. Denies SOB, chest pain, blurry vision. Has history of hematochezia requiring blood  transfusion in the remote past. Denies having a colonoscopy. Pt has hx of alcoholism and last had 2 beers last night.   In the ED pt was hypotensive down to 80/40's. Pt given 1L NS bolus during exam and 2 peripheral IV access ordered. CT of abd/pelvis obtained while in the ED revealed possible uncomplicated colitis, hepatic steatosis, and chronic pancreatitis.   Hospital Course by problem list:    GI bleed -> Rectal bleeding - Patient presented hypotensive, was found to have a drop in Hgb to 8.7 mg/dL from 13.3 in 2014.  She was given IVF and monitored closely.  She did not require transfusion.  An abdominal CT was obtained on admission which suggested pan colitis.  A GI pathogen panel was obtained but empiric antibiotics were not started due to lack of diarrhea.  GI was consulted  and recommend supportive care.  Patient felt better after IVF and wanted to be discharged home on 3/12, GI reported that a colonoscopy could be obtained as an outpatient in the next 2 weeks and that since she had stable Hgb with no futher bleeding she was stable for discharge.  She was discharged home to follow up with Dr. Hilarie Fredrickson.    Seizure disorder - She was maintained on her home dose of dilantin and no seizure activity was noted.    Hepatic steatosis - Noted on abdominal imaging, this is secondary to her alcohol abuse, for which she was consoled.    Alcohol abuse - Elevated ETOH level on admission, has multiple complications from her ETOH use including hepatitis, chronic pancreatitis, and malnutrition.  Reports plans to stop.  She was monitored on our CIWA protocol for withdraw and was never significantly elevated.    Chronic pancreatitis - Her Lipase was not elevated, she was continued on her home creon.    Hypomagnesemia -This is likely due to her alcoholism.  Mag was 1.3 she was given intravenous magnesium replacement.      Hypokalemia On day of discharge her potassium was 3.4 she was given 2mEq of  potassium for replacement.    Thrombocytopenia - Likely secondary to ETOH.  This was monitored and stable throughout the admission.   Alcoholic hepatitis without ascites - Found to have elevated liver enzymes in setting of alcohol use.  A acute viral hepatitis panel was also checked to rule out infectious cause which was negative.  Normocytic anemia- Acute blood loss anemia - Patient has had variable Hgb in her records but likely has a baseline of around 10mg /dL, she presented with GI bleeding and found to have a stable Hgb of 7-8mg /dL.  Her baseline anemia is likely secondary to her ETOH use and her acute blood loss may be secondary to her rectal bleeding.  Her Hgb was stable and she had no further bleeding.  Please at follow up recheck a CBC and consider checking an Anemia panel.  Discharge Vitals:   BP 145/103 mmHg  Pulse 100  Temp(Src) 98.9 F (37.2 C) (Oral)  Resp 20  Ht 5\' 3"  (1.6 m)  Wt 127 lb 3.2 oz (57.698 kg)  BMI 22.54 kg/m2  SpO2 100%  Discharge Labs:  Results for orders placed or performed during the hospital encounter of 04/14/14 (from the past 24 hour(s))  CBC     Status: Abnormal   Collection Time: 04/15/14  7:20 PM  Result Value Ref Range   WBC 3.7 (L) 4.0 - 10.5 K/uL   RBC 2.40 (L) 3.87 - 5.11 MIL/uL   Hemoglobin 7.9 (L) 12.0 - 15.0 g/dL   HCT 23.2 (L) 36.0 - 46.0 %   MCV 96.7 78.0 - 100.0 fL   MCH 32.9 26.0 - 34.0 pg   MCHC 34.1 30.0 - 36.0 g/dL   RDW 13.2 11.5 - 15.5 %   Platelets 75 (L) 150 - 400 K/uL  CBC     Status: Abnormal   Collection Time: 04/16/14  6:16 AM  Result Value Ref Range   WBC 4.4 4.0 - 10.5 K/uL   RBC 2.42 (L) 3.87 - 5.11 MIL/uL   Hemoglobin 7.8 (L) 12.0 - 15.0 g/dL   HCT 23.7 (L) 36.0 - 46.0 %   MCV 97.9 78.0 - 100.0 fL   MCH 32.2 26.0 - 34.0 pg   MCHC 32.9 30.0 - 36.0 g/dL   RDW 13.0 11.5 - 15.5 %  Platelets 79 (L) 150 - 400 K/uL  Basic metabolic panel     Status: Abnormal   Collection Time: 04/16/14  6:16 AM  Result Value Ref  Range   Sodium 139 135 - 145 mmol/L   Potassium 3.4 (L) 3.5 - 5.1 mmol/L   Chloride 102 96 - 112 mmol/L   CO2 26 19 - 32 mmol/L   Glucose, Bld 84 70 - 99 mg/dL   BUN <5 (L) 6 - 23 mg/dL   Creatinine, Ser 0.53 0.50 - 1.10 mg/dL   Calcium 8.5 8.4 - 10.5 mg/dL   GFR calc non Af Amer >90 >90 mL/min   GFR calc Af Amer >90 >90 mL/min   Anion gap 11 5 - 15    Signed: Lucious Groves, DO 04/16/2014, 2:04 PM    Services Ordered on Discharge: None Equipment Ordered on Discharge: None

## 2014-04-16 NOTE — Progress Notes (Addendum)
Subjective:  Patient states that she is feeling well today and has been up and ambulating. She denies any bloody bowel movements over the last 24 hours.   Objective: Vital signs in last 24 hours: Filed Vitals:   04/15/14 1835 04/15/14 2352 04/16/14 0258 04/16/14 0621  BP: 142/96 166/94 135/86 144/96  Pulse: 103 109 101 110  Temp: 100 F (37.8 C) 99.8 F (37.7 C) 99.2 F (37.3 C) 99.4 F (37.4 C)  TempSrc: Oral Oral Oral Oral  Resp: 16 18 24 20   Height: 5\' 3"  (1.6 m)     Weight: 127 lb 3.2 oz (57.698 kg)     SpO2: 100% 100% 100% 100%   Weight change: 3 lb 3.2 oz (1.452 kg)  Intake/Output Summary (Last 24 hours) at 04/16/14 0944 Last data filed at 04/16/14 5456  Gross per 24 hour  Intake   1287 ml  Output   1450 ml  Net   -163 ml   General: NAD, laying in bed comfortably Lungs: CTAB, no wheezing Cardiac: RRR, no murmurs GI: soft, active bowel sounds, non TTP Neuro: CN II-XII grossly intact  Lab Results: Basic Metabolic Panel:  Recent Labs Lab 04/15/14 0355 04/15/14 0521 04/16/14 0616  NA 141  --  139  K 4.0  --  3.4*  CL 107  --  102  CO2 17*  --  26  GLUCOSE 61*  --  84  BUN <5*  --  <5*  CREATININE 0.83  --  0.53  CALCIUM 7.8*  --  8.5  MG  --  1.3*  --   PHOS  --  3.8  --    Liver Function Tests:  Recent Labs Lab 04/14/14 1300 04/15/14 0355  AST 110* 167*  ALT 45* 51*  ALKPHOS 74 80  BILITOT 0.4 1.2  PROT 5.9* 6.3  ALBUMIN 3.2* 3.4*    Recent Labs Lab 04/14/14 1300  LIPASE 17   CBC:  Recent Labs Lab 04/14/14 1300  04/15/14 1920 04/16/14 0616  WBC 6.3  < > 3.7* 4.4  NEUTROABS 2.8  --   --   --   HGB 8.7*  < > 7.9* 7.8*  HCT 26.2*  < > 23.2* 23.7*  MCV 98.9  < > 96.7 97.9  PLT 72*  < > 75* 79*  < > = values in this interval not displayed. Coagulation:  Recent Labs Lab 04/14/14 1300  LABPROT 14.2  INR 1.09   Urine Drug Screen: Drugs of Abuse     Component Value Date/Time   LABOPIA NONE DETECTED 06/26/2012 0134   LABOPIA NEG 05/31/2009 2246   COCAINSCRNUR NONE DETECTED 06/26/2012 0134   COCAINSCRNUR POS* 05/31/2009 2246   LABBENZ NONE DETECTED 06/26/2012 0134   LABBENZ NEG 05/31/2009 2246   AMPHETMU NONE DETECTED 06/26/2012 0134   AMPHETMU NEG 05/31/2009 2246   THCU NONE DETECTED 06/26/2012 0134   LABBARB NONE DETECTED 06/26/2012 0134    Alcohol Level:  Recent Labs Lab 04/14/14 1712  ETH 145*   Urinalysis:  Recent Labs Lab 04/14/14 1419  COLORURINE YELLOW  LABSPEC 1.026  PHURINE 5.5  GLUCOSEU NEGATIVE  HGBUR SMALL*  BILIRUBINUR NEGATIVE  KETONESUR NEGATIVE  PROTEINUR NEGATIVE  UROBILINOGEN 0.2  NITRITE NEGATIVE  LEUKOCYTESUR NEGATIVE    Micro Results: Recent Results (from the past 240 hour(s))  MRSA PCR Screening     Status: None   Collection Time: 04/14/14  6:50 PM  Result Value Ref Range Status   MRSA by PCR NEGATIVE NEGATIVE  Final    Comment:        The GeneXpert MRSA Assay (FDA approved for NASAL specimens only), is one component of a comprehensive MRSA colonization surveillance program. It is not intended to diagnose MRSA infection nor to guide or monitor treatment for MRSA infections.   Clostridium Difficile by PCR     Status: None   Collection Time: 04/15/14  3:37 AM  Result Value Ref Range Status   C difficile by pcr NEGATIVE NEGATIVE Final   Studies/Results: Ct Abdomen Pelvis W Contrast  04/14/2014   CLINICAL DATA:  2-3 episodes of rectal bleeding.  Abdominal pain.  EXAM: CT ABDOMEN AND PELVIS WITH CONTRAST  TECHNIQUE: Multidetector CT imaging of the abdomen and pelvis was performed using the standard protocol following bolus administration of intravenous contrast.  CONTRAST:  25mL OMNIPAQUE IOHEXOL 300 MG/ML  SOLN  COMPARISON:  05/06/2012.  FINDINGS: Lower chest: Patchy areas of ground-glass attenuation are noted in both lung bases. There is no pleural effusion identified.  Hepatobiliary: Diffuse hepatic steatosis noted. No suspicious liver abnormality. The  gallbladder is normal. No biliary dilatation.  Pancreas: There is atrophy of the pancreas. Calcifications in the distribution of the pancreatic head noted.  Spleen: Negative  Adrenals/Urinary Tract: The adrenal glands are both normal. Chronic scarring involving the left kidney noted. Right kidney is normal. The urinary bladder is unremarkable.  Stomach/Bowel: The stomach is normal. The small bowel loops have a normal course and caliber. No obstruction. The appendix is visualized and appears normal. Numerous colonic diverticula noted. There is mild wall thickening involving the ascending colon, transverse colon and descending colon. No pneumatosis or evidence of bowel perforation. No obstructing mass noted.  Vascular/Lymphatic: Normal appearance of the abdominal aorta.  Reproductive: The uterus and the adnexal structures are unremarkable for patient's age.  Other: No free fluid or fluid collections within the abdomen or pelvis.  Musculoskeletal: The visualized bony structures are significant for mild lumbar degenerative disc disease.  IMPRESSION: 1. There is mild wall thickening involving the ascending colon, transverse colon and descending colon worrisome for uncomplicated colitis. No pneumatosis or evidence of bowel perforation. No abscess. 2. Hepatic steatosis. 3. Changes within the pancreas compatible with chronic pancreatitis.   Electronically Signed   By: Kerby Moors M.D.   On: 04/14/2014 14:24   Medications: I have reviewed the patient's current medications. Scheduled Meds: . feeding supplement (RESOURCE BREEZE)  1 Container Oral TID BM  . folic acid  1 mg Oral Daily  . Influenza vac split quadrivalent PF  0.5 mL Intramuscular Tomorrow-1000  . lipase/protease/amylase  1 capsule Oral TID AC  . multivitamin with minerals  1 tablet Oral Daily  . pantoprazole  40 mg Oral Daily  . phenytoin  300 mg Oral QHS  . potassium chloride  40 mEq Oral Once  . thiamine  100 mg Oral Daily   Or  . thiamine  100  mg Intravenous Daily   Continuous Infusions:  PRN Meds:.LORazepam **OR** LORazepam Assessment/Plan: Principal Problem:   GI bleed Active Problems:   Seizure disorder   Hepatic steatosis   Alcohol abuse   Chronic pancreatitis   Protein-calorie malnutrition, severe   Bleeding gastrointestinal   Abnormal CT scan, colon   Rectal bleeding   Abdominal pain   Alcoholic hepatitis without ascites   Severe protein-calorie malnutrition   Hypomagnesemia   Hypokalemia   Thrombocytopenia   GI bleed--Improved with no reports of hematochezia over the last 24 hours. Hemoglobin stable at 7.8 from 7.9.  -  Possibly colitis, awaiting results of GI pathogen panel recommend supportive care with eventual colonoscopy.  -  po protonix 40mg  - continue clear liquid diet.  Alcohol abuse- last drink was last night. AST/ALT >2. CIWA ranged from 1-4 overnight - CIWA protocol - thiamine, folate, MVI - hepatitis panel negative  Seizure d/o-- seen by Dr. Jannifer Franklin with [facility]. - continue home dilantin 300mg  qhs  Chronic pancreatitis-- lipase WNL at 17 - continue home creon  Thrombocytopenia - SCDs for DVT PPx  FEN - clear liquid diet - Hypokalemia K+ 3.4 will replace  - Hypomagnesemia mag 1.3, will replete with 400mg  mag ox  DVT ppx-- SCDs due to thrombocytopenia  Dispo: Disposition is deferred at this time, awaiting improvement of current medical problems. Anticipated discharge in approximately 1-2 day(s).   The patient does not have a current PCP (No PCP per patient) and does not know need an [facility] hospital follow-up appointment after discharge.  The patient does not have transportation limitations that hinder transportation to clinic appointments.  .Services Needed at time of discharge: Y = Yes, Blank = No PT:   OT:   RN:   Equipment:   Other:     LOS: 2 days   Lucious Groves, DO 04/16/2014, 9:44 AM

## 2014-04-16 NOTE — Progress Notes (Signed)
Patient complaining of chest pain, "aching' rates "3" out of 10 - midsternum. V/s in. EKG completed: NSR . Placed into patient's chart. Dr. Posey Pronto notified. Patient requesting tylenol for pain. MD stated he would place order. Will continue to monitor.

## 2014-04-16 NOTE — Discharge Instructions (Signed)
Dr. Venita Sheffield office will contact you within 2 weeks to schedule a colonoscopy if you do not hear from them please give them a call.  Please call or return to the ED if you experience worsening weakness or further blood loss.

## 2014-04-18 ENCOUNTER — Telehealth: Payer: Self-pay

## 2014-04-18 NOTE — Telephone Encounter (Signed)
-----   Message from Jerene Bears, MD sent at 04/16/2014 11:47 AM EST ----- Needs OV with APP within 2 weeks Hospital followup - colitis, alc hep, needs outpt colon  Thanks  JMP

## 2014-04-18 NOTE — Telephone Encounter (Signed)
Pt scheduled to see Alonza Bogus PA 05/02/14@10am . Appt letter mailed to pt.

## 2014-04-19 LAB — GI PATHOGEN PANEL BY PCR, STOOL
C DIFFICILE TOXIN A/B: NOT DETECTED
CAMPYLOBACTER BY PCR: NOT DETECTED
Cryptosporidium by PCR: NOT DETECTED
E coli (ETEC) LT/ST: NOT DETECTED
E coli (STEC): NOT DETECTED
E coli 0157 by PCR: NOT DETECTED
G LAMBLIA BY PCR: NOT DETECTED
NOROVIRUS G1/G2: NOT DETECTED
ROTAVIRUS A BY PCR: NOT DETECTED
Salmonella by PCR: NOT DETECTED
Shigella by PCR: NOT DETECTED

## 2014-05-02 ENCOUNTER — Ambulatory Visit: Payer: Medicaid Other | Admitting: Gastroenterology

## 2014-10-15 ENCOUNTER — Encounter (HOSPITAL_COMMUNITY): Payer: Self-pay | Admitting: *Deleted

## 2014-10-15 ENCOUNTER — Emergency Department (HOSPITAL_COMMUNITY)
Admission: EM | Admit: 2014-10-15 | Discharge: 2014-10-15 | Disposition: A | Discharge status: Home / Self Care | Payer: Medicaid Other | Attending: Emergency Medicine | Admitting: Emergency Medicine

## 2014-10-15 DIAGNOSIS — G40909 Epilepsy, unspecified, not intractable, without status epilepticus: Secondary | ICD-10-CM | POA: Insufficient documentation

## 2014-10-15 DIAGNOSIS — Z880 Allergy status to penicillin: Secondary | ICD-10-CM | POA: Insufficient documentation

## 2014-10-15 DIAGNOSIS — J45909 Unspecified asthma, uncomplicated: Secondary | ICD-10-CM | POA: Insufficient documentation

## 2014-10-15 DIAGNOSIS — R634 Abnormal weight loss: Secondary | ICD-10-CM | POA: Diagnosis not present

## 2014-10-15 DIAGNOSIS — J029 Acute pharyngitis, unspecified: Secondary | ICD-10-CM | POA: Insufficient documentation

## 2014-10-15 DIAGNOSIS — Z87448 Personal history of other diseases of urinary system: Secondary | ICD-10-CM | POA: Insufficient documentation

## 2014-10-15 DIAGNOSIS — Z8719 Personal history of other diseases of the digestive system: Secondary | ICD-10-CM | POA: Insufficient documentation

## 2014-10-15 DIAGNOSIS — G8929 Other chronic pain: Secondary | ICD-10-CM | POA: Diagnosis not present

## 2014-10-15 DIAGNOSIS — Z79899 Other long term (current) drug therapy: Secondary | ICD-10-CM | POA: Insufficient documentation

## 2014-10-15 DIAGNOSIS — I10 Essential (primary) hypertension: Secondary | ICD-10-CM | POA: Diagnosis not present

## 2014-10-15 LAB — COMPREHENSIVE METABOLIC PANEL
ALBUMIN: 3.8 g/dL (ref 3.5–5.0)
ALT: 35 U/L (ref 14–54)
AST: 81 U/L — ABNORMAL HIGH (ref 15–41)
Alkaline Phosphatase: 89 U/L (ref 38–126)
Anion gap: 12 (ref 5–15)
BUN: <5 mg/dL — ABNORMAL LOW (ref 6–20)
CO2: 24 mmol/L (ref 22–32)
CREATININE: 0.49 mg/dL (ref 0.44–1.00)
Calcium: 9.2 mg/dL (ref 8.9–10.3)
Chloride: 103 mmol/L (ref 101–111)
GFR calc Af Amer: >60 mL/min (ref >60)
GFR calc non Af Amer: >60 mL/min (ref >60)
Glucose, Bld: 88 mg/dL (ref 65–99)
Potassium: 4.4 mmol/L (ref 3.5–5.1)
SODIUM: 139 mmol/L (ref 135–145)
Total Bilirubin: 0.6 mg/dL (ref 0.3–1.2)
Total Protein: 7.9 g/dL (ref 6.5–8.1)

## 2014-10-15 LAB — CBC WITH DIFFERENTIAL/PLATELET
BASOS ABS: 0.1 10*3/uL (ref 0.0–0.1)
BASOS PCT: 1 % (ref 0–1)
EOS PCT: 1 % (ref 0–5)
Eosinophils Absolute: 0.1 10*3/uL (ref 0.0–0.7)
HCT: 37.2 % (ref 36.0–46.0)
Hemoglobin: 12.6 g/dL (ref 12.0–15.0)
LYMPHS PCT: 29 % (ref 12–46)
Lymphs Abs: 1.7 10*3/uL (ref 0.7–4.0)
MCH: 31.3 pg (ref 26.0–34.0)
MCHC: 33.9 g/dL (ref 30.0–36.0)
MCV: 92.5 fL (ref 78.0–100.0)
Monocytes Absolute: 0.7 10*3/uL (ref 0.1–1.0)
Monocytes Relative: 11 % (ref 3–12)
Neutro Abs: 3.4 10*3/uL (ref 1.7–7.7)
Neutrophils Relative %: 58 % (ref 43–77)
Platelets: 103 10*3/uL — ABNORMAL LOW (ref 150–400)
RBC: 4.02 MIL/uL (ref 3.87–5.11)
RDW: 15.6 % — ABNORMAL HIGH (ref 11.5–15.5)
WBC: 5.9 10*3/uL (ref 4.0–10.5)

## 2014-10-15 LAB — RAPID STREP SCREEN (MED CTR MEBANE ONLY): Streptococcus, Group A Screen (Direct): NEGATIVE

## 2014-10-15 MED ORDER — ATENOLOL 25 MG PO TABS: 50.0000 mg | ORAL_TABLET | Freq: Every day | ORAL | Status: DC

## 2014-10-15 MED ORDER — NAPROXEN 375 MG PO TABS: 375.0000 mg | ORAL_TABLET | Freq: Two times a day (BID) | ORAL | Status: DC

## 2014-10-15 MED ORDER — HYDROCODONE-HOMATROPINE 5-1.5 MG/5ML PO SYRP
5.0000 mL | ORAL_SOLUTION | Freq: Once | ORAL | Status: AC
  Administered 2014-10-15: 5 mL via ORAL
  Filled 2014-10-15: qty 5

## 2014-10-15 NOTE — ED Notes (Signed)
Pt states, " I am seeing my regular doctor about my weight loss. I was taking mulit vitamins. I go back to see them on the 15th of this month."

## 2014-10-15 NOTE — ED Notes (Addendum)
Pt reports sore throat x 1 week and unable to eat. Denies n/v/d. 30 lb weight loss since March.

## 2014-10-15 NOTE — ED Notes (Signed)
Pt placed on monitor. Pt monitored by blood pressure and pulse ox.

## 2014-10-15 NOTE — Discharge Instructions (Signed)
Your strep screen is negative. Use Chloraseptic spray and salt water gargles.  Follow up with your doctor or return here for worsening symptoms.

## 2014-10-15 NOTE — ED Notes (Signed)
Patient left at this time with all belongings. 

## 2014-10-15 NOTE — ED Provider Notes (Signed)
CSN: [id number]     Arrival date & time 10/15/14  1115 History  This chart was scribed for [facility], NP, working with Dorie Rank, MD by Starleen Arms, ED Scribe. This patient was seen in room TR09C/TR09C and the patient's care was started at 4:12 PM.    Chief Complaint  Patient presents with  . Sore Throat   The history is provided by the patient. No language interpreter was used.   HPI Comments: Brandy Bishop is a 54 y.o. female without significant hx who presents to the Emergency Department complaining of a constant, 10/10, scratching sore throat onset 1 week ago, worse with swallowing, not relieved by Mucinex/ibuprofen.  Associated symptoms include 30 lbs weight loss in the past 6 months after admission for GIB, ear pain, and painful swallowing with no solid po intake for 1 week.  She has been able to tolerate fluids.  Patient is followed by her PCP at [facility] for her weight loss.  She was seen 1 month ago where she had labs performed and is scheduled to be seen on 9/15.  She is unaware of any dx leading to weight loss.  She denies congestion,  Neck pain.  other complaints.   Past Medical History  Diagnosis Date  . Asthma   . Hypertension   . Pancreatitis     Chronic pancreatitis noted on CT scan in 04/2014.  . Colitis 04/2014.    Bloody diarrhea  . Alcoholism 2011  . Polysubstance abuse 2011  . Pyelonephritis 05/2012  . GIB (gastrointestinal bleeding)   . Daily headache   . Seizures     "don't know what they are from" (04/14/2014)  . Chronic lower back pain    Past Surgical History  Procedure Laterality Date  . No past surgeries     Family History  Problem Relation Age of Onset  . Cancer Mother   . Cancer Brother    Social History  Substance Use Topics  . Smoking status: Never Smoker   . Smokeless tobacco: Never Used  . Alcohol Use: 6.0 oz/week    10 Cans of beer per week     Comment: 04/14/2014 "I drink all of them; I'm not drinking anything anymore"   OB  History    No data available     Review of Systems A complete 10 system review of systems was obtained and all systems are negative except as noted in the HPI and PMH.   Allergies  Penicillins  Home Medications   Prior to Admission medications   Medication Sig Start Date End Date Taking? Authorizing Provider  albuterol (PROVENTIL HFA;VENTOLIN HFA) 108 (90 BASE) MCG/ACT inhaler Inhale 2 puffs into the lungs once. Patient not taking: Reported on 04/14/2014 07/06/12   Marin Olp, MD  atenolol (TENORMIN) 25 MG tablet Take 2 tablets (50 mg total) by mouth daily. 10/15/14   Hope Bunnie Pion, NP  feeding supplement, RESOURCE BREEZE, (RESOURCE BREEZE) LIQD Take 1 Container by mouth 3 (three) times daily between meals. 04/16/14   Lucious Groves, DO  folic acid (FOLVITE) 1 MG tablet Take 1 tablet (1 mg total) by mouth daily. 04/16/14   Lucious Groves, DO  lipase/protease/amylase (CREON-10/PANCREASE) 12000 UNITS CPEP Take 1 capsule by mouth 3 (three) times daily before meals. 05/11/12   Belkys A Regalado, MD  Multiple Vitamin (MULTIVITAMIN WITH MINERALS) TABS Take 1 tablet by mouth daily. Patient not taking: Reported on 04/14/2014 05/11/12   Elmarie Shiley, MD  phenytoin (DILANTIN) 100 MG ER capsule Take 3 capsules (300 mg total) by mouth at bedtime. 06/26/12   Shanon Rosser, MD  thiamine 100 MG tablet Take 1 tablet (100 mg total) by mouth daily. 04/16/14   Lucious Groves, DO   BP 177/97 mmHg  Pulse 82  Temp(Src) 97.7 F (36.5 C) (Oral)  Resp 16  Ht 5\' 3"  (1.6 m)  Wt 98 lb 4 oz (44.566 kg)  BMI 17.41 kg/m2  SpO2 98% Physical Exam  Constitutional: She is oriented to person, place, and time. She appears well-developed and well-nourished. No distress.  Thin, frail appearing african-american female.   HENT:  Head: Normocephalic and atraumatic.  Ears normal. Throat: uvula midline with mild erythema and no edema  Eyes: Conjunctivae and EOM are normal.  Neck: Neck supple. No tracheal deviation present.   Cardiovascular: Normal rate and regular rhythm.   Pulmonary/Chest: Effort normal and breath sounds normal. No respiratory distress.  Lungs clear.  Abdominal: Bowel sounds are normal. There is no tenderness.  Musculoskeletal: Normal range of motion.  Neurological: She is alert and oriented to person, place, and time.  Skin: Skin is warm and dry.  Psychiatric: She has a normal mood and affect. Her behavior is normal.  Nursing note and vitals reviewed.   ED Course  Procedures (including critical care time)  DIAGNOSTIC STUDIES: Oxygen Saturation is 95% on RA, normal by my interpretation.    COORDINATION OF CARE:  4:22 PM Discussed treatment plan with patient at bedside.  Patient acknowledges and agrees with plan.    Labs Review Results for orders placed or performed during the hospital encounter of 10/15/14 (from the past 24 hour(s))  Rapid strep screen     Status: None   Collection Time: 10/15/14  4:16 PM  Result Value Ref Range   Streptococcus, Group A Screen (Direct) NEGATIVE NEGATIVE  CBC with Differential/Platelet     Status: Abnormal   Collection Time: 10/15/14  5:13 PM  Result Value Ref Range   WBC 5.9 4.0 - 10.5 K/uL   RBC 4.02 3.87 - 5.11 MIL/uL   Hemoglobin 12.6 12.0 - 15.0 g/dL   HCT 37.2 36.0 - 46.0 %   MCV 92.5 78.0 - 100.0 fL   MCH 31.3 26.0 - 34.0 pg   MCHC 33.9 30.0 - 36.0 g/dL   RDW 15.6 (H) 11.5 - 15.5 %   Platelets 103 (L) 150 - 400 K/uL   Neutrophils Relative % 58 43 - 77 %   Neutro Abs 3.4 1.7 - 7.7 K/uL   Lymphocytes Relative 29 12 - 46 %   Lymphs Abs 1.7 0.7 - 4.0 K/uL   Monocytes Relative 11 3 - 12 %   Monocytes Absolute 0.7 0.1 - 1.0 K/uL   Eosinophils Relative 1 0 - 5 %   Eosinophils Absolute 0.1 0.0 - 0.7 K/uL   Basophils Relative 1 0 - 1 %   Basophils Absolute 0.1 0.0 - 0.1 K/uL  Comprehensive metabolic panel     Status: Abnormal   Collection Time: 10/15/14  5:13 PM  Result Value Ref Range   Sodium 139 135 - 145 mmol/L   Potassium 4.4 3.5  - 5.1 mmol/L   Chloride 103 101 - 111 mmol/L   CO2 24 22 - 32 mmol/L   Glucose, Bld 88 65 - 99 mg/dL   BUN <5 (L) 6 - 20 mg/dL   Creatinine, Ser 0.49 0.44 - 1.00 mg/dL   Calcium 9.2 8.9 - 10.3 mg/dL  Total Protein 7.9 6.5 - 8.1 g/dL   Albumin 3.8 3.5 - 5.0 g/dL   AST 81 (H) 15 - 41 U/L   ALT 35 14 - 54 U/L   Alkaline Phosphatase 89 38 - 126 U/L   Total Bilirubin 0.6 0.3 - 1.2 mg/dL   GFR calc non Af Amer >60 >60 mL/min   GFR calc Af Amer >60 >60 mL/min   Anion gap 12 5 - 15    Patient treated for pain with Hycodan 5 cc po Patient's pain resolved and she is eating a sandwich and drinking without difficulty. States she feels much better.   I discussed this case with Dr. Hillard Danker. With patient hx of polysubstance abuse will no give Rx for narcotics. She is to follow up with her PCP as scheduled 10/20/14.   Patient is out of her BP medication. Will give enough until her f/u with her PCP.  MDM  54 y.o. female with sore throat that has gotten worse over the past 3 days. Discussed with Dr. Hillard Danker and labs and hx reviewed. No concern for pharyngeal abscess or obstruction as patient has normal CBC and is eating and drinking without difficulty. Patient request narcotic liquid to go home with until her follow up with her PCP. Due to her hx of polysubstance abuse will not give Rx for narcotic. Discussed with the patient and all questioned fully answered. She will return if any problems arise.   Final diagnoses:  Sore throat   I personally performed the services described in this documentation, which was scribed in my presence. The recorded information has been reviewed and is accurate.     [place], [place] 10/15/14 2117  Dorie Rank, MD 10/16/14 2041

## 2014-10-17 LAB — CULTURE, GROUP A STREP: Strep A Culture: NEGATIVE

## 2015-02-25 ENCOUNTER — Emergency Department (HOSPITAL_COMMUNITY)
Admission: EM | Admit: 2015-02-25 | Discharge: 2015-02-25 | Disposition: A | Discharge status: Home / Self Care | Payer: Medicaid Other | Attending: Emergency Medicine | Admitting: Emergency Medicine

## 2015-02-25 ENCOUNTER — Emergency Department (HOSPITAL_COMMUNITY): Payer: Medicaid Other

## 2015-02-25 ENCOUNTER — Encounter (HOSPITAL_COMMUNITY): Payer: Self-pay | Admitting: Emergency Medicine

## 2015-02-25 DIAGNOSIS — Z8719 Personal history of other diseases of the digestive system: Secondary | ICD-10-CM | POA: Insufficient documentation

## 2015-02-25 DIAGNOSIS — Z880 Allergy status to penicillin: Secondary | ICD-10-CM | POA: Insufficient documentation

## 2015-02-25 DIAGNOSIS — R109 Unspecified abdominal pain: Secondary | ICD-10-CM | POA: Diagnosis present

## 2015-02-25 DIAGNOSIS — Z79899 Other long term (current) drug therapy: Secondary | ICD-10-CM | POA: Diagnosis not present

## 2015-02-25 DIAGNOSIS — G8929 Other chronic pain: Secondary | ICD-10-CM | POA: Insufficient documentation

## 2015-02-25 DIAGNOSIS — D171 Benign lipomatous neoplasm of skin and subcutaneous tissue of trunk: Secondary | ICD-10-CM

## 2015-02-25 DIAGNOSIS — I10 Essential (primary) hypertension: Secondary | ICD-10-CM | POA: Insufficient documentation

## 2015-02-25 DIAGNOSIS — J159 Unspecified bacterial pneumonia: Secondary | ICD-10-CM | POA: Diagnosis not present

## 2015-02-25 DIAGNOSIS — J45909 Unspecified asthma, uncomplicated: Secondary | ICD-10-CM | POA: Insufficient documentation

## 2015-02-25 DIAGNOSIS — J189 Pneumonia, unspecified organism: Secondary | ICD-10-CM

## 2015-02-25 DIAGNOSIS — Z3202 Encounter for pregnancy test, result negative: Secondary | ICD-10-CM | POA: Insufficient documentation

## 2015-02-25 DIAGNOSIS — Z8742 Personal history of other diseases of the female genital tract: Secondary | ICD-10-CM | POA: Diagnosis not present

## 2015-02-25 DIAGNOSIS — F10129 Alcohol abuse with intoxication, unspecified: Secondary | ICD-10-CM | POA: Insufficient documentation

## 2015-02-25 DIAGNOSIS — D1779 Benign lipomatous neoplasm of other sites: Secondary | ICD-10-CM | POA: Insufficient documentation

## 2015-02-25 LAB — COMPREHENSIVE METABOLIC PANEL
ALT: 13 U/L — ABNORMAL LOW (ref 14–54)
AST: 24 U/L (ref 15–41)
Albumin: 3.9 g/dL (ref 3.5–5.0)
Alkaline Phosphatase: 68 U/L (ref 38–126)
Anion gap: 15 (ref 5–15)
BUN: 10 mg/dL (ref 6–20)
CO2: 27 mmol/L (ref 22–32)
Calcium: 8.7 mg/dL — ABNORMAL LOW (ref 8.9–10.3)
Chloride: 98 mmol/L — ABNORMAL LOW (ref 101–111)
Creatinine, Ser: 0.62 mg/dL (ref 0.44–1.00)
GFR calc Af Amer: >60 mL/min (ref >60)
GFR calc non Af Amer: >60 mL/min (ref >60)
Glucose, Bld: 81 mg/dL (ref 65–99)
Potassium: 3.8 mmol/L (ref 3.5–5.1)
Sodium: 140 mmol/L (ref 135–145)
Total Bilirubin: 0.3 mg/dL (ref 0.3–1.2)
Total Protein: 7.5 g/dL (ref 6.5–8.1)

## 2015-02-25 LAB — I-STAT CG4 LACTIC ACID, ED
Lactic Acid, Venous: 2.31 mmol/L (ref 0.5–2.0)
Lactic Acid, Venous: 2.18 mmol/L (ref 0.5–2.0)

## 2015-02-25 LAB — URINE MICROSCOPIC-ADD ON
Bacteria, UA: NONE SEEN
RBC / HPF: NONE SEEN RBC/hpf (ref 0–5)
WBC, UA: NONE SEEN WBC/hpf (ref 0–5)

## 2015-02-25 LAB — CBC WITH DIFFERENTIAL/PLATELET
Basophils Absolute: 0 10*3/uL (ref 0.0–0.1)
Basophils Relative: 1 %
Eosinophils Absolute: 0 10*3/uL (ref 0.0–0.7)
Eosinophils Relative: 1 %
HCT: 31.9 % — ABNORMAL LOW (ref 36.0–46.0)
Hemoglobin: 10.8 g/dL — ABNORMAL LOW (ref 12.0–15.0)
Lymphocytes Relative: 52 %
Lymphs Abs: 2.2 10*3/uL (ref 0.7–4.0)
MCH: 31.2 pg (ref 26.0–34.0)
MCHC: 33.9 g/dL (ref 30.0–36.0)
MCV: 92.2 fL (ref 78.0–100.0)
Monocytes Absolute: 0.2 10*3/uL (ref 0.1–1.0)
Monocytes Relative: 5 %
Neutro Abs: 1.8 10*3/uL (ref 1.7–7.7)
Neutrophils Relative %: 41 %
Platelets: 132 10*3/uL — ABNORMAL LOW (ref 150–400)
RBC: 3.46 MIL/uL — ABNORMAL LOW (ref 3.87–5.11)
RDW: 14.1 % (ref 11.5–15.5)
WBC: 4.3 10*3/uL (ref 4.0–10.5)

## 2015-02-25 LAB — RAPID URINE DRUG SCREEN, HOSP PERFORMED
Amphetamines: NOT DETECTED
Barbiturates: NOT DETECTED
Benzodiazepines: NOT DETECTED
Cocaine: POSITIVE — AB
Opiates: NOT DETECTED
Tetrahydrocannabinol: NOT DETECTED

## 2015-02-25 LAB — URINALYSIS, ROUTINE W REFLEX MICROSCOPIC
Bilirubin Urine: NEGATIVE
Glucose, UA: NEGATIVE mg/dL
Ketones, ur: NEGATIVE mg/dL
Nitrite: NEGATIVE
Protein, ur: NEGATIVE mg/dL
Specific Gravity, Urine: 1.005 (ref 1.005–1.030)
pH: 5 (ref 5.0–8.0)

## 2015-02-25 LAB — I-STAT TROPONIN, ED: Troponin i, poc: 0 ng/mL (ref 0.00–0.08)

## 2015-02-25 LAB — I-STAT BETA HCG BLOOD, ED (MC, WL, AP ONLY): I-stat hCG, quantitative: <5 m[IU]/mL (ref <5)

## 2015-02-25 LAB — LIPASE, BLOOD: Lipase: 17 U/L (ref 11–51)

## 2015-02-25 MED ORDER — AZITHROMYCIN 250 MG PO TABS: 250.0000 mg | ORAL_TABLET | Freq: Every day | ORAL | Status: DC

## 2015-02-25 MED ORDER — SODIUM CHLORIDE 0.9 % IV BOLUS (SEPSIS)
1000.0000 mL | Freq: Once | INTRAVENOUS | Status: AC
  Administered 2015-02-25: 1000 mL via INTRAVENOUS

## 2015-02-25 MED ORDER — IOHEXOL 300 MG/ML  SOLN
100.0000 mL | Freq: Once | INTRAMUSCULAR | Status: AC | PRN
  Administered 2015-02-25: 80 mL via INTRAVENOUS

## 2015-02-25 MED ORDER — AZITHROMYCIN 250 MG PO TABS
500.0000 mg | ORAL_TABLET | Freq: Once | ORAL | Status: AC
  Administered 2015-02-25: 500 mg via ORAL
  Filled 2015-02-25: qty 2

## 2015-02-25 MED ORDER — IOHEXOL 300 MG/ML  SOLN
25.0000 mL | Freq: Once | INTRAMUSCULAR | Status: AC | PRN
  Administered 2015-02-25: 25 mL via ORAL

## 2015-02-25 NOTE — ED Notes (Signed)
Bed: [facility]ALA Expected date:  Expected time:  Means of arrival:  Comments: 

## 2015-02-25 NOTE — ED Notes (Signed)
Bed: WA09 Expected date:  Expected time:  Means of arrival:  Comments: EMS-abd pain; ETOH

## 2015-02-25 NOTE — ED Notes (Addendum)
Pt arrived via EMS with report of difficulty ambulating and pain to abd, lt shoulder, back, and central chest. Pt reported n/v and "knot in my stomach" but denies diarrhea and fever. (+)ETOH, pt reported drink 2 beers today.

## 2015-02-25 NOTE — ED Notes (Signed)
RN to start IV and draw labs 

## 2015-02-25 NOTE — Discharge Instructions (Signed)
Please follow-up with her primary care provider in 2 days for reevaluation, any new or worsening signs or symptoms present please return immediately to the emergency room for further evaluation and management.  Community-Acquired Pneumonia, Adult Pneumonia is an infection of the lungs. One type of pneumonia can happen while a person is in a hospital. A different type can happen when a person is not in a hospital (community-acquired pneumonia). It is easy for this kind to spread from person to person. It can spread to you if you breathe near an infected person who coughs or sneezes. Some symptoms include:  A dry cough.  A wet (productive) cough.  Fever.  Sweating.  Chest pain. HOME CARE  Take over-the-counter and prescription medicines only as told by your doctor.  Only take cough medicine if you are losing sleep.  If you were prescribed an antibiotic medicine, take it as told by your doctor. Do not stop taking the antibiotic even if you start to feel better.  Sleep with your head and neck raised (elevated). You can do this by putting a few pillows under your head, or you can sleep in a recliner.  Do not use tobacco products. These include cigarettes, chewing tobacco, and e-cigarettes. If you need help quitting, ask your doctor.  Drink enough water to keep your pee (urine) clear or pale yellow. A shot (vaccine) can help prevent pneumonia. Shots are often suggested for:  People older than 54 years of age.  People older than 54 years of age:  Who are having cancer treatment.  Who have long-term (chronic) lung disease.  Who have problems with their body's defense system (immune system). You may also prevent pneumonia if you take these actions:  Get the flu (influenza) shot every year.  Go to the dentist as often as told.  Wash your hands often. If soap and water are not available, use hand sanitizer. GET HELP IF:  You have a fever.  You lose sleep because your cough  medicine does not help. GET HELP RIGHT AWAY IF:  You are short of breath and it gets worse.  You have more chest pain.  Your sickness gets worse. This is very serious if:  You are an older adult.  Your body's defense system is weak.  You cough up blood.   This information is not intended to replace advice given to you by your health care provider. Make sure you discuss any questions you have with your health care provider.   Document Released: 07/10/2007 Document Revised: 10/12/2014 Document Reviewed: 05/18/2014 Elsevier Interactive Patient Education [employer].

## 2015-02-25 NOTE — ED Notes (Signed)
Pt walked to restroom little to no assist

## 2015-02-25 NOTE — ED Notes (Signed)
Pt at xray, delay in lab draws

## 2015-02-25 NOTE — ED Provider Notes (Signed)
CSN: [id number]     Arrival date & time 02/25/15  1612 History   First MD Initiated Contact with Patient 02/25/15 1724     Chief Complaint  Patient presents with  . Abdominal Pain    HPI   55 year old female presents today with complaints of abdominal pain. Patient is intoxicated at time of my evaluation, she is able to provide sufficient information at this time. She is tolerating oral secretions, and is oriented to person place and time and event. She reports that she's had a "knot" in her stomach for 2 months, but notes that it started hurting more over the last 2 weeks. She reports that she's been able to eat and drink as normal, and reports she was celebrating her birthday yesterday with numerous alcoholic drinks and marijuana. She reports that she had a normal bowel movement today, and has no changes in her current urinary function or frequency. She also reports right hip and leg pain for the last 2 days, but is uncertain if she hasn't done anything to cause any significant damage to it. She denies any loss of distal sensation strength or motor function. Patient additionally notes that she's having chest pain, she is unable to describe the chest pain, tummy when the chest pain began, or any further information regarding this. Multiple times that description of these were unsuccessful with patient just repeating "it hurts". Patient denies any drug use other than the marijuana. Patient denies any fever, chills, nausea, vomiting, respiratory complaints; patient does report a nonproductive cough. Patient also reports right hip pain, reports this was probably from "dancing too much", but reports pain with ambulation but is able to ambulate. She denies any loss of distal sensation strength or motor function.. Denies any history of trauma.   Past Medical History  Diagnosis Date  . Asthma   . Hypertension   . Pancreatitis     Chronic pancreatitis noted on CT scan in 04/2014.  . Colitis 04/2014.   Bloody diarrhea  . Alcoholism ([place]) 2011  . Polysubstance abuse 2011  . Pyelonephritis 05/2012  . GIB (gastrointestinal bleeding)   . Daily headache   . Seizures ([place])     "don't know what they are from" (04/14/2014)  . Chronic lower back pain    Past Surgical History  Procedure Laterality Date  . No past surgeries     Family History  Problem Relation Age of Onset  . Cancer Mother   . Cancer Brother    Social History  Substance Use Topics  . Smoking status: Never Smoker   . Smokeless tobacco: Never Used  . Alcohol Use: 6.0 oz/week    10 Cans of beer per week     Comment: 04/14/2014 "I drink all of them; I'm not drinking anything anymore"   OB History    No data available     Review of Systems  All other systems reviewed and are negative.   Allergies  Penicillins  Home Medications   Prior to Admission medications   Medication Sig Start Date End Date Taking? Authorizing Provider  ibuprofen (ADVIL,MOTRIN) 200 MG tablet Take 200 mg by mouth every 6 (six) hours as needed for headache.   Yes Historical Provider, MD  phenytoin (DILANTIN) 100 MG ER capsule Take 3 capsules (300 mg total) by mouth at bedtime. 06/26/12  Yes John Molpus, MD  albuterol (PROVENTIL HFA;VENTOLIN HFA) 108 (90 BASE) MCG/ACT inhaler Inhale 2 puffs into the lungs once. Patient not taking: Reported on 04/14/2014 07/06/12  Marin Olp, MD  atenolol (TENORMIN) 25 MG tablet Take 2 tablets (50 mg total) by mouth daily. 10/15/14   Hope Bunnie Pion, NP  azithromycin (ZITHROMAX) 250 MG tablet Take 1 tablet (250 mg total) by mouth daily. Take first 2 tablets together, then 1 every day until finished. 02/25/15   Okey Regal, PA-C  feeding supplement, RESOURCE BREEZE, (RESOURCE BREEZE) LIQD Take 1 Container by mouth 3 (three) times daily between meals. Patient not taking: Reported on 02/25/2015 04/16/14   Lucious Groves, DO  folic acid (FOLVITE) 1 MG tablet Take 1 tablet (1 mg total) by mouth daily. Patient not taking:  Reported on 02/25/2015 04/16/14   Lucious Groves, DO  lipase/protease/amylase (CREON-10/PANCREASE) 12000 UNITS CPEP Take 1 capsule by mouth 3 (three) times daily before meals. Patient not taking: Reported on 02/25/2015 05/11/12   Belkys A Regalado, MD  Multiple Vitamin (MULTIVITAMIN WITH MINERALS) TABS Take 1 tablet by mouth daily. Patient not taking: Reported on 04/14/2014 05/11/12   Belkys A Regalado, MD  thiamine 100 MG tablet Take 1 tablet (100 mg total) by mouth daily. Patient not taking: Reported on 02/25/2015 04/16/14   Lucious Groves, DO   BP 137/91 mmHg  Pulse 93  Temp(Src) 98.1 F (36.7 C) (Oral)  Resp 20  SpO2 100%   Physical Exam  Constitutional: She is oriented to person, place, and time. She appears well-developed and well-nourished.  HENT:  Head: Normocephalic and atraumatic.  Eyes: Conjunctivae are normal. Pupils are equal, round, and reactive to light. Right eye exhibits no discharge. Left eye exhibits no discharge. No scleral icterus.  Neck: Normal range of motion. No JVD present. No tracheal deviation present.  Cardiovascular: Normal rate, regular rhythm, normal heart sounds and intact distal pulses.  Exam reveals no gallop and no friction rub.   No murmur heard. Pulmonary/Chest: Effort normal and breath sounds normal. No stridor. No respiratory distress. She has no wheezes. She has no rales. She exhibits no tenderness.  Abdominal: Bowel sounds are normal. She exhibits no distension and no mass. There is tenderness. There is no rebound and no guarding.    Firm nodule, nonreducible, not worse with intra-abdominal pressure, likely lipoma   Neurological: She is alert and oriented to person, place, and time. Coordination normal.  Skin: Skin is warm and dry. Erythema: hedgmdm.  Psychiatric: She has a normal mood and affect. Her behavior is normal. Judgment and thought content normal.  Nursing note and vitals reviewed.   ED Course  Procedures (including critical care time) Labs  Review Labs Reviewed  CBC WITH DIFFERENTIAL/PLATELET - Abnormal; Notable for the following:    RBC 3.46 (*)    Hemoglobin 10.8 (*)    HCT 31.9 (*)    Platelets 132 (*)    All other components within normal limits  COMPREHENSIVE METABOLIC PANEL - Abnormal; Notable for the following:    Chloride 98 (*)    Calcium 8.7 (*)    ALT 13 (*)    All other components within normal limits  URINALYSIS, ROUTINE W REFLEX MICROSCOPIC (NOT AT [facility]) - Abnormal; Notable for the following:    Hgb urine dipstick TRACE (*)    Leukocytes, UA SMALL (*)    All other components within normal limits  URINE RAPID DRUG SCREEN, HOSP PERFORMED - Abnormal; Notable for the following:    Cocaine POSITIVE (*)    All other components within normal limits  URINE MICROSCOPIC-ADD ON - Abnormal; Notable for the following:    Squamous  Epithelial / LPF 0-5 (*)    All other components within normal limits  I-STAT CG4 LACTIC ACID, ED - Abnormal; Notable for the following:    Lactic Acid, Venous 2.31 (*)    All other components within normal limits  I-STAT CG4 LACTIC ACID, ED - Abnormal; Notable for the following:    Lactic Acid, Venous 2.18 (*)    All other components within normal limits  LIPASE, BLOOD  I-STAT TROPOININ, ED  I-STAT BETA HCG BLOOD, ED ([facility], [facility], [facility] ONLY)    Imaging Review Dg Chest 2 View  02/25/2015  CLINICAL DATA:  Left lower chest pain 3 days EXAM: CHEST  2 VIEW COMPARISON:  07/07/2012 FINDINGS: There is hazy left lower lobe airspace disease. There is no pleural effusion or pneumothorax. The heart and mediastinal contours are unremarkable. The osseous structures are unremarkable. IMPRESSION: Left lower lobe pneumonia. Followup PA and lateral chest X-ray is recommended in 3-4 weeks following trial of antibiotic therapy to ensure resolution and exclude underlying malignancy. Electronically Signed   By: Kathreen Devoid   On: 02/25/2015 18:07   Ct Abdomen Pelvis W Contrast  02/25/2015  CLINICAL DATA:  Pt  arrived via EMS with report of difficulty ambulating and pain to abd today, lt shoulder, back, and central chest. Pt reported n/v and "knot in my stomach" but denies diarrhea and fever. EXAM: CT ABDOMEN AND PELVIS WITH CONTRAST TECHNIQUE: Multidetector CT imaging of the abdomen and pelvis was performed using the standard protocol following bolus administration of intravenous contrast. CONTRAST:  16mL OMNIPAQUE IOHEXOL 300 MG/ML SOLN, 83mL OMNIPAQUE IOHEXOL 300 MG/ML SOLN COMPARISON:  04/14/2014 FINDINGS: Lower chest:  No acute abnormalities Hepatobiliary: Stable mild diffuse steatosis without acute findings Pancreas: The pancreas is atrophic and there is mild ductal dilatation in the tail. There is calcification the pancreatic head and neck. Its appearance unchanged. Spleen: Spleen is relatively small but negative. Adrenals/Urinary Tract: Adrenal glands are negative. Kidneys are normal. Bladder is normal. Stomach/Bowel: There is a nonobstructive bowel gas pattern. There is diverticulosis throughout most of the large bowel. There is mild increased attenuation in the anti mesenteric fat in the region of the splenic flexure of the colon. No evidence of abscess free air or free fluid. Appendix is normal. Vascular/Lymphatic: Mild atherosclerotic aortic calcification Reproductive: Normal Other: No ascites Musculoskeletal: No acute findings IMPRESSION: Diffuse colonic diverticulosis. Mildly increased attenuation in the pericolonic fat near the splenic flexure, in an area that is not particularly demonstrative of diverticulosis. This finding is nonspecific and could reflect mild diverticulitis or nonspecific colitis. No complicating features identified to suggest abscess or perforation. Although the colon did appear diffusely inflamed on the prior study, this area of mesenteric attenuation was not present at that time. Additionally, the colon does not currently demonstrate features of diffuse inflammation as it did  previously. There is again evidence of chronic pancreatitis. Electronically Signed   By: Skipper Cliche M.D.   On: 02/25/2015 21:09   I have personally reviewed and evaluated these images and lab results as part of my medical decision-making.   EKG Interpretation   Date/Time:  Saturday February 25 2015 18:12:10 EST Ventricular Rate:  75 PR Interval:  189 QRS Duration: 95 QT Interval:  412 QTC Calculation: 460 R Axis:   49 Text Interpretation:  Sinus rhythm Baseline wander in lead(s) V6 since  last tracing no significant change Confirmed by Eulis Foster  MD, ELLIOTT [id number])  on 02/25/2015 8:21:29 PM      MDM   Final  diagnoses:  Lipoma of abdominal wall  Community acquired pneumonia    Labs: I-STAT lactic acid, i-STAT troponin, i-STAT beta hCG, CBC, CMP, lipase, urinalysis, urine rapid drug screen- initial lactate 2.31 down to 2.18, no leukocytosis  Imaging: CT abdomen pelvis with contrast- see above  Consults:  Therapeutics:  Discharge Meds:   Assessment/Plan: Patient was initially intoxicated upon my initial evaluation, with numerous complaints. After evaluation patient appeared to be sober, had complete resolution of her abdominal discomfort, and had no chest pain. She was found to have pneumonia on chest x-ray, she remained afebrile, nontoxic with reassuring vital signs. No significant findings on CT abdomen pelvis that would necessitate further evaluation or management here in the ED. Patient's abdominal complaint is likely due to a lipoma of the abdominal soft tissue, with low suspicion for hernia. Patient initially had a elevated lactic acid, but was trending down with fluid here. She had no other concerning signs or symptoms other than the chest x-ray findings. Patient will be given a dose of her antibiotics here, prescription for home use. She is instructed to follow-up with her primary care provider in 2 days( Monday) for reevaluation. Patient is given strict return precautions,  she verbalizes understanding and agreement for today's plan and had no further questions or concerns at time of discharge     .    Okey Regal, PA-C 02/25/15 2151  Daleen Bo, MD 02/26/15 [phone]

## 2015-02-25 NOTE — ED Notes (Signed)
Abnormal results given to dr Eulis Foster

## 2015-02-25 NOTE — ED Notes (Signed)
Awake. Verbally responsive. A/O x4. Resp even and unlabored. No audible adventitious breath sounds noted. ABC's intact.  

## 2015-02-25 NOTE — ED Notes (Signed)
[facility] contacted for transportation.  

## 2015-03-21 ENCOUNTER — Other Ambulatory Visit: Payer: Self-pay

## 2015-03-21 ENCOUNTER — Other Ambulatory Visit: Payer: Self-pay | Admitting: Primary Care

## 2015-03-21 DIAGNOSIS — Z1231 Encounter for screening mammogram for malignant neoplasm of breast: Secondary | ICD-10-CM

## 2015-03-24 ENCOUNTER — Ambulatory Visit: Payer: Medicaid Other

## 2015-04-06 ENCOUNTER — Ambulatory Visit: Payer: Medicaid Other

## 2015-04-14 ENCOUNTER — Ambulatory Visit
Admission: RE | Admit: 2015-04-14 | Discharge: 2015-04-14 | Disposition: A | Discharge status: Home / Self Care | Payer: Medicaid Other | Source: Ambulatory Visit | Attending: Primary Care | Admitting: Primary Care

## 2015-04-14 DIAGNOSIS — Z1231 Encounter for screening mammogram for malignant neoplasm of breast: Secondary | ICD-10-CM

## 2015-05-29 ENCOUNTER — Encounter (HOSPITAL_COMMUNITY): Payer: Self-pay | Admitting: Emergency Medicine

## 2015-05-29 ENCOUNTER — Emergency Department (HOSPITAL_COMMUNITY): Payer: Medicaid Other

## 2015-05-29 ENCOUNTER — Inpatient Hospital Stay (HOSPITAL_COMMUNITY)
Admission: EM | Admit: 2015-05-29 | Discharge: 2015-06-01 | DRG: 377 | Disposition: A | Discharge status: Home / Self Care | Payer: Medicaid Other | Attending: Internal Medicine | Admitting: Internal Medicine

## 2015-05-29 DIAGNOSIS — Z5181 Encounter for therapeutic drug level monitoring: Secondary | ICD-10-CM

## 2015-05-29 DIAGNOSIS — E43 Unspecified severe protein-calorie malnutrition: Secondary | ICD-10-CM | POA: Diagnosis present

## 2015-05-29 DIAGNOSIS — D62 Acute posthemorrhagic anemia: Secondary | ICD-10-CM | POA: Diagnosis present

## 2015-05-29 DIAGNOSIS — R636 Underweight: Secondary | ICD-10-CM | POA: Diagnosis present

## 2015-05-29 DIAGNOSIS — D638 Anemia in other chronic diseases classified elsewhere: Secondary | ICD-10-CM | POA: Diagnosis present

## 2015-05-29 DIAGNOSIS — Z880 Allergy status to penicillin: Secondary | ICD-10-CM

## 2015-05-29 DIAGNOSIS — R059 Cough, unspecified: Secondary | ICD-10-CM

## 2015-05-29 DIAGNOSIS — J45909 Unspecified asthma, uncomplicated: Secondary | ICD-10-CM | POA: Diagnosis present

## 2015-05-29 DIAGNOSIS — G8929 Other chronic pain: Secondary | ICD-10-CM | POA: Diagnosis present

## 2015-05-29 DIAGNOSIS — K921 Melena: Principal | ICD-10-CM | POA: Diagnosis present

## 2015-05-29 DIAGNOSIS — K2100 Gastro-esophageal reflux disease with esophagitis, without bleeding: Secondary | ICD-10-CM | POA: Insufficient documentation

## 2015-05-29 DIAGNOSIS — K760 Fatty (change of) liver, not elsewhere classified: Secondary | ICD-10-CM | POA: Diagnosis present

## 2015-05-29 DIAGNOSIS — Z681 Body mass index (BMI) 19 or less, adult: Secondary | ICD-10-CM

## 2015-05-29 DIAGNOSIS — Z79899 Other long term (current) drug therapy: Secondary | ICD-10-CM

## 2015-05-29 DIAGNOSIS — R569 Unspecified convulsions: Secondary | ICD-10-CM

## 2015-05-29 DIAGNOSIS — D696 Thrombocytopenia, unspecified: Secondary | ICD-10-CM

## 2015-05-29 DIAGNOSIS — G40909 Epilepsy, unspecified, not intractable, without status epilepticus: Secondary | ICD-10-CM | POA: Diagnosis present

## 2015-05-29 DIAGNOSIS — K922 Gastrointestinal hemorrhage, unspecified: Secondary | ICD-10-CM

## 2015-05-29 DIAGNOSIS — F1010 Alcohol abuse, uncomplicated: Secondary | ICD-10-CM

## 2015-05-29 DIAGNOSIS — Z9114 Patient's other noncompliance with medication regimen: Secondary | ICD-10-CM

## 2015-05-29 DIAGNOSIS — F1020 Alcohol dependence, uncomplicated: Secondary | ICD-10-CM | POA: Diagnosis present

## 2015-05-29 DIAGNOSIS — K861 Other chronic pancreatitis: Secondary | ICD-10-CM | POA: Diagnosis present

## 2015-05-29 DIAGNOSIS — I10 Essential (primary) hypertension: Secondary | ICD-10-CM | POA: Diagnosis present

## 2015-05-29 DIAGNOSIS — R05 Cough: Secondary | ICD-10-CM

## 2015-05-29 DIAGNOSIS — J40 Bronchitis, not specified as acute or chronic: Secondary | ICD-10-CM | POA: Diagnosis present

## 2015-05-29 DIAGNOSIS — M545 Low back pain: Secondary | ICD-10-CM | POA: Diagnosis present

## 2015-05-29 DIAGNOSIS — R7889 Finding of other specified substances, not normally found in blood: Secondary | ICD-10-CM

## 2015-05-29 MED ORDER — SODIUM CHLORIDE 0.9 % IV BOLUS (SEPSIS)
1000.0000 mL | Freq: Once | INTRAVENOUS | Status: AC
  Administered 2015-05-30: 1000 mL via INTRAVENOUS

## 2015-05-29 NOTE — ED Notes (Signed)
Per [facility]   At approx 1944 pt had a seizure according to the husband. 2nd seizure "last 10 min" husband caught her. Hx of epilepsy. Both seizure last "10 mins" not post ictal.  Hx of tarry stool with 3x  Of tarry stools today like that. Cold and flu like symptoms for the last 3 days. Does not know what caused previous tarry stools. Vitals were stable. A&O  22LH

## 2015-05-29 NOTE — ED Provider Notes (Signed)
CSN: [id number]     Arrival date & time 05/29/15  2046 History   First MD Initiated Contact with Patient 05/29/15 2110     Chief Complaint  Patient presents with  . Seizures  . Flank Pain  . Melena     (Consider location/radiation/quality/duration/timing/severity/associated sxs/prior Treatment) HPI   Brandy Bishop is a 55 y.o. female brought in by EMS for 2 witnessed seizures, events were witnessed by her boyfriend. Patient has history of seizure disorder, takes phenytoin, states that she has been taking it irregularly. As per boyfriend, she was found on the ground 2 and half hours ago in the kitchen, had tonic-clonic movement was out for about 10 minutes and was confused when she woke up, no incontinence. He put her on the couch and went in another room to watch TV, he heard another fall 10 minutes later she was in the restroom. He picked her up then and she was again having tonic-clonic movement. No incontinence. Patient reports melanotic stool onset this a.m., no NSAID use that she drinks daily, has shakes when she doesn't drink, last alcohol intake was today. Patient denies chest pain, shortness of breath abdominal pain, nausea, vomiting. She had 3 episodes of large volume, loose, melanotic stool today. Has history of GI bleed and has not required transfusion. On review of systems she notes a generalized headache and low back pain, no exacerbating or alleviating factors for the headache. Described as throbbing. No change in vision, nausea, vomiting, dysarthria, ataxia, cervicalgia.   Past Medical History  Diagnosis Date  . Asthma   . Hypertension   . Pancreatitis     Chronic pancreatitis noted on CT scan in 04/2014.  . Colitis 04/2014.    Bloody diarrhea  . Alcoholism ([place]) 2011  . Polysubstance abuse 2011  . Pyelonephritis 05/2012  . GIB (gastrointestinal bleeding)   . Daily headache   . Seizures ([place])     "don't know what they are from" (04/14/2014)  . Chronic lower back pain     Past Surgical History  Procedure Laterality Date  . No past surgeries     Family History  Problem Relation Age of Onset  . Cancer Mother   . Cancer Brother    Social History  Substance Use Topics  . Smoking status: Never Smoker   . Smokeless tobacco: Never Used  . Alcohol Use: 6.0 oz/week    10 Cans of beer per week     Comment: 04/14/2014 "I drink all of them; I'm not drinking anything anymore"   OB History    No data available     Review of Systems   10 systems reviewed and found to be negative, except as noted in the HPI.   Allergies  Penicillins  Home Medications   Prior to Admission medications   Medication Sig Start Date End Date Taking? Authorizing Provider  atenolol (TENORMIN) 25 MG tablet Take 2 tablets (50 mg total) by mouth daily. Patient taking differently: Take 25 mg by mouth daily.  10/15/14  Yes Hope Bunnie Pion, NP  lipase/protease/amylase (CREON-10/PANCREASE) 12000 UNITS CPEP Take 1 capsule by mouth 3 (three) times daily before meals. 05/11/12  Yes Belkys A Regalado, MD  phenytoin (DILANTIN) 100 MG ER capsule Take 3 capsules (300 mg total) by mouth at bedtime. 06/26/12  Yes John Molpus, MD  albuterol (PROVENTIL HFA;VENTOLIN HFA) 108 (90 BASE) MCG/ACT inhaler Inhale 2 puffs into the lungs once. Patient not taking: Reported on 04/14/2014 07/06/12   Marin Olp,  MD  azithromycin (ZITHROMAX) 250 MG tablet Take 1 tablet (250 mg total) by mouth daily. Take first 2 tablets together, then 1 every day until finished. 02/25/15   Okey Regal, PA-C  feeding supplement, RESOURCE BREEZE, (RESOURCE BREEZE) LIQD Take 1 Container by mouth 3 (three) times daily between meals. Patient not taking: Reported on 02/25/2015 04/16/14   Lucious Groves, DO  folic acid (FOLVITE) 1 MG tablet Take 1 tablet (1 mg total) by mouth daily. Patient not taking: Reported on 02/25/2015 04/16/14   Lucious Groves, DO  Multiple Vitamin (MULTIVITAMIN WITH MINERALS) TABS Take 1 tablet by mouth  daily. Patient not taking: Reported on 04/14/2014 05/11/12   Belkys A Regalado, MD  thiamine 100 MG tablet Take 1 tablet (100 mg total) by mouth daily. Patient not taking: Reported on 02/25/2015 04/16/14   Lucious Groves, DO   BP 132/84 mmHg  Pulse 103  Resp 22  SpO2 100% Physical Exam  Constitutional: She is oriented to person, place, and time. She appears well-developed and well-nourished. No distress.  HENT:  Head: Normocephalic.  Mouth/Throat: Oropharynx is clear and moist.  Eyes: Conjunctivae and EOM are normal. Pupils are equal, round, and reactive to light.  Neck: Normal range of motion.  Cardiovascular: Regular rhythm and intact distal pulses.   Mild tachycardia  Pulmonary/Chest: Effort normal and breath sounds normal. No stridor. No respiratory distress. She has no wheezes. She has no rales. She exhibits no tenderness.  Abdominal: Soft. Bowel sounds are normal. She exhibits no distension and no mass. There is no tenderness. There is no rebound and no guarding.  No ascites, no stigmata of liver disease  Genitourinary:  Pt uses the restroom while in the ED, melanotic and gross blood in stool  Musculoskeletal: Normal range of motion.  Neurological: She is alert and oriented to person, place, and time.  Psychiatric: She has a normal mood and affect.  Nursing note and vitals reviewed.   ED Course  Procedures (including critical care time) Labs Review Labs Reviewed  PHENYTOIN LEVEL, TOTAL - Abnormal; Notable for the following:    Phenytoin Lvl 5.8 (*)    All other components within normal limits  CBC WITH DIFFERENTIAL/PLATELET - Abnormal; Notable for the following:    RBC 2.60 (*)    Hemoglobin 8.5 (*)    HCT 24.8 (*)    Platelets 105 (*)    All other components within normal limits  BASIC METABOLIC PANEL - Abnormal; Notable for the following:    Glucose, Bld 118 (*)    Calcium 8.7 (*)    All other components within normal limits  URINALYSIS, ROUTINE W REFLEX MICROSCOPIC  (NOT AT [facility]) - Abnormal; Notable for the following:    Hgb urine dipstick MODERATE (*)    All other components within normal limits  HEPATIC FUNCTION PANEL - Abnormal; Notable for the following:    AST 52 (*)    All other components within normal limits  URINE MICROSCOPIC-ADD ON - Abnormal; Notable for the following:    Squamous Epithelial / LPF 0-5 (*)    Bacteria, UA RARE (*)    All other components within normal limits  ETHANOL  PROTIME-INR  SAMPLE TO BLOOD BANK    Imaging Review Ct Head Wo Contrast  05/30/2015  CLINICAL DATA:  Acute onset of seizure.  Initial encounter. EXAM: CT HEAD WITHOUT CONTRAST TECHNIQUE: Contiguous axial images were obtained from the base of the skull through the vertex without intravenous contrast. COMPARISON:  CT of  the head performed 10/24/2012 FINDINGS: There is no evidence of acute infarction, mass lesion, or intra- or extra-axial hemorrhage on CT. Prominence of the ventricles and sulci reflects mild to moderate cortical volume loss. 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 Mild periventricular and subcortical white matter change likely reflects small vessel ischemic microangiopathy. The brainstem and fourth ventricle are within normal limits. The basal ganglia are unremarkable in appearance. No mass effect or midline shift is seen. There is no evidence of fracture; visualized osseous structures are unremarkable in appearance. The visualized portions of the orbits are within normal limits. The paranasal sinuses and mastoid air cells are well-aerated. No significant soft tissue abnormalities are seen. IMPRESSION: 1. No acute intracranial pathology seen on CT. 2. Mild to moderate cortical volume loss and scattered small vessel ischemic microangiopathy. 3. 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 Electronically Signed   By: Garald Balding M.D.   On: 05/30/2015 00:26   I have  personally reviewed and evaluated these images and lab results as part of my medical decision-making.   EKG Interpretation None      MDM   Final diagnoses:  Gastrointestinal hemorrhage, unspecified gastritis, unspecified gastrointestinal hemorrhage type  Thrombocytopenia (HCC)  Alcohol abuse  Subtherapeutic phenytoin level  Seizure ([place])    Filed Vitals:   05/29/15 2300 05/29/15 2315 05/30/15 0015 05/30/15 0030  BP: 135/84 127/84 142/87 132/84  Pulse: 72 84 102 103  Resp: 20 21 22 22   SpO2: 100% 100% 100% 100%    Medications  sodium chloride 0.9 % bolus 1,000 mL (0 mLs Intravenous Paused 05/30/15 0106)  phytonadione (VITAMIN K) 10 mg in dextrose 5 % 50 mL IVPB (0 mg Intravenous Paused 05/30/15 0106)  pantoprazole (PROTONIX) 80 mg in sodium chloride 0.9 % 100 mL IVPB (not administered)  pantoprazole (PROTONIX) 80 mg in sodium chloride 0.9 % 250 mL (0.32 mg/mL) infusion (not administered)  phenytoin (DILANTIN) 1,000 mg in sodium chloride 0.9 % 250 mL IVPB (not administered)  octreotide (SANDOSTATIN) 2 mcg/mL load via infusion 50 mcg (not administered)    And  octreotide (SANDOSTATIN) 500 mcg in sodium chloride 0.9 % 250 mL (2 mcg/mL) infusion (not administered)  morphine 4 MG/ML injection 4 mg (not administered)    BRANDII RIESER is 55 y.o. female presenting with 2 seizures, no confusion, patient is not post ictal. She has history of epilepsy and is noncompliant with her diltiazem, also history of alcohol abuse, appears mildly intoxicated. No signs of overt alcohol withdrawal. Boyfriend reports head trauma however there is no objective signs, will CT head. Will check basic blood work and diltiazem level, will likely need loading.  Patient had a bowel movement while in the ED, it is large volume and melanotic mixed with maroon stool. Chart review shows that she was recently admitted for GI bleed, she went home before colonoscopy could be completed. States that she did not follow  up with GI as an outpatient. 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 Patient will be started on Protonix bolus and drip and given vitamin K. No real abdominal pain, no nausea or vomiting. Limits low at 105. Hemoglobin is low at 8.5, this is down 2 points from last reading 3 months ago. Shoulder with no signs of symptomatic anemia, sample sent to blood bank, will hold on transfusion now.  Phenytoin level is subtherapeutic at 5.8, will load with 1 g, LFTs with mild elevation  in AST at 52.  CT head negative.  Gastroenterology consult from [facility] Dr. Loletha Carrow appreciated: Recommends starting octreotide bolus and drip, will have Dr. Alvester Chou evaluate the patient in the a.m.  INR normal.  Unassigned admission to Dr. Blaine Hamper  This is a shared visit with the attending physician who personally evaluated the patient and agrees with the care plan.      Elmyra Ricks Patrizia Paule, PA-C 05/30/15 0131  Rolland Porter, MD 05/30/15 0200

## 2015-05-30 ENCOUNTER — Observation Stay (HOSPITAL_COMMUNITY): Payer: Medicaid Other

## 2015-05-30 DIAGNOSIS — Z880 Allergy status to penicillin: Secondary | ICD-10-CM | POA: Diagnosis not present

## 2015-05-30 DIAGNOSIS — R05 Cough: Secondary | ICD-10-CM | POA: Diagnosis present

## 2015-05-30 DIAGNOSIS — M545 Low back pain: Secondary | ICD-10-CM | POA: Diagnosis present

## 2015-05-30 DIAGNOSIS — K860 Alcohol-induced chronic pancreatitis: Secondary | ICD-10-CM

## 2015-05-30 DIAGNOSIS — K921 Melena: Principal | ICD-10-CM

## 2015-05-30 DIAGNOSIS — I10 Essential (primary) hypertension: Secondary | ICD-10-CM | POA: Diagnosis present

## 2015-05-30 DIAGNOSIS — Z79899 Other long term (current) drug therapy: Secondary | ICD-10-CM | POA: Diagnosis not present

## 2015-05-30 DIAGNOSIS — Z5181 Encounter for therapeutic drug level monitoring: Secondary | ICD-10-CM

## 2015-05-30 DIAGNOSIS — R7889 Finding of other specified substances, not normally found in blood: Secondary | ICD-10-CM | POA: Insufficient documentation

## 2015-05-30 DIAGNOSIS — R636 Underweight: Secondary | ICD-10-CM | POA: Diagnosis present

## 2015-05-30 DIAGNOSIS — F1010 Alcohol abuse, uncomplicated: Secondary | ICD-10-CM | POA: Diagnosis not present

## 2015-05-30 DIAGNOSIS — Z681 Body mass index (BMI) 19 or less, adult: Secondary | ICD-10-CM | POA: Diagnosis not present

## 2015-05-30 DIAGNOSIS — K922 Gastrointestinal hemorrhage, unspecified: Secondary | ICD-10-CM | POA: Diagnosis not present

## 2015-05-30 DIAGNOSIS — D62 Acute posthemorrhagic anemia: Secondary | ICD-10-CM

## 2015-05-30 DIAGNOSIS — E43 Unspecified severe protein-calorie malnutrition: Secondary | ICD-10-CM | POA: Diagnosis present

## 2015-05-30 DIAGNOSIS — K210 Gastro-esophageal reflux disease with esophagitis: Secondary | ICD-10-CM | POA: Diagnosis not present

## 2015-05-30 DIAGNOSIS — G40909 Epilepsy, unspecified, not intractable, without status epilepticus: Secondary | ICD-10-CM | POA: Diagnosis not present

## 2015-05-30 DIAGNOSIS — G8929 Other chronic pain: Secondary | ICD-10-CM | POA: Diagnosis present

## 2015-05-30 DIAGNOSIS — D696 Thrombocytopenia, unspecified: Secondary | ICD-10-CM | POA: Diagnosis present

## 2015-05-30 DIAGNOSIS — J40 Bronchitis, not specified as acute or chronic: Secondary | ICD-10-CM | POA: Diagnosis present

## 2015-05-30 DIAGNOSIS — K861 Other chronic pancreatitis: Secondary | ICD-10-CM | POA: Diagnosis present

## 2015-05-30 DIAGNOSIS — F1020 Alcohol dependence, uncomplicated: Secondary | ICD-10-CM | POA: Diagnosis present

## 2015-05-30 DIAGNOSIS — R059 Cough, unspecified: Secondary | ICD-10-CM | POA: Insufficient documentation

## 2015-05-30 DIAGNOSIS — K760 Fatty (change of) liver, not elsewhere classified: Secondary | ICD-10-CM | POA: Diagnosis present

## 2015-05-30 DIAGNOSIS — J45909 Unspecified asthma, uncomplicated: Secondary | ICD-10-CM | POA: Diagnosis present

## 2015-05-30 DIAGNOSIS — R569 Unspecified convulsions: Secondary | ICD-10-CM | POA: Diagnosis not present

## 2015-05-30 DIAGNOSIS — D638 Anemia in other chronic diseases classified elsewhere: Secondary | ICD-10-CM | POA: Diagnosis present

## 2015-05-30 DIAGNOSIS — Z9114 Patient's other noncompliance with medication regimen: Secondary | ICD-10-CM | POA: Diagnosis not present

## 2015-05-30 LAB — CBC WITH DIFFERENTIAL/PLATELET
BASOS PCT: 1 %
Basophils Absolute: 0 10*3/uL (ref 0.0–0.1)
EOS ABS: 0 10*3/uL (ref 0.0–0.7)
EOS PCT: 0 %
HCT: 24.8 % — ABNORMAL LOW (ref 36.0–46.0)
HEMOGLOBIN: 8.5 g/dL — AB (ref 12.0–15.0)
LYMPHS ABS: 1.5 10*3/uL (ref 0.7–4.0)
Lymphocytes Relative: 25 %
MCH: 32.7 pg (ref 26.0–34.0)
MCHC: 34.3 g/dL (ref 30.0–36.0)
MCV: 95.4 fL (ref 78.0–100.0)
MONOS PCT: 8 %
Monocytes Absolute: 0.5 10*3/uL (ref 0.1–1.0)
NEUTROS PCT: 66 %
Neutro Abs: 4 10*3/uL (ref 1.7–7.7)
PLATELETS: 105 10*3/uL — AB (ref 150–400)
RBC: 2.6 MIL/uL — AB (ref 3.87–5.11)
RDW: 14.4 % (ref 11.5–15.5)
WBC: 6 10*3/uL (ref 4.0–10.5)

## 2015-05-30 LAB — CBC
HEMATOCRIT: 22.6 % — AB (ref 36.0–46.0)
HEMATOCRIT: 23.7 % — AB (ref 36.0–46.0)
HEMOGLOBIN: 7.8 g/dL — AB (ref 12.0–15.0)
Hemoglobin: 7.5 g/dL — ABNORMAL LOW (ref 12.0–15.0)
MCH: 31.4 pg (ref 26.0–34.0)
MCH: 31.3 pg (ref 26.0–34.0)
MCHC: 33.2 g/dL (ref 30.0–36.0)
MCHC: 32.9 g/dL (ref 30.0–36.0)
MCV: 94.6 fL (ref 78.0–100.0)
MCV: 95.2 fL (ref 78.0–100.0)
Platelets: 98 10*3/uL — ABNORMAL LOW (ref 150–400)
Platelets: 101 10*3/uL — ABNORMAL LOW (ref 150–400)
RBC: 2.39 MIL/uL — AB (ref 3.87–5.11)
RBC: 2.49 MIL/uL — ABNORMAL LOW (ref 3.87–5.11)
RDW: 14.3 % (ref 11.5–15.5)
RDW: 14.4 % (ref 11.5–15.5)
WBC: 4.9 10*3/uL (ref 4.0–10.5)
WBC: 5.2 10*3/uL (ref 4.0–10.5)

## 2015-05-30 LAB — BASIC METABOLIC PANEL
Anion gap: 13 (ref 5–15)
BUN: 11 mg/dL (ref 6–20)
CALCIUM: 8.7 mg/dL — AB (ref 8.9–10.3)
CO2: 22 mmol/L (ref 22–32)
CREATININE: 0.6 mg/dL (ref 0.44–1.00)
Chloride: 102 mmol/L (ref 101–111)
Glucose, Bld: 118 mg/dL — ABNORMAL HIGH (ref 65–99)
Potassium: 4.3 mmol/L (ref 3.5–5.1)
SODIUM: 137 mmol/L (ref 135–145)

## 2015-05-30 LAB — PROTIME-INR
INR: 1.04 (ref 0.00–1.49)
PROTHROMBIN TIME: 13.8 s (ref 11.6–15.2)

## 2015-05-30 LAB — HEPATIC FUNCTION PANEL
ALT: 25 U/L (ref 14–54)
AST: 52 U/L — ABNORMAL HIGH (ref 15–41)
Albumin: 3.6 g/dL (ref 3.5–5.0)
Alkaline Phosphatase: 62 U/L (ref 38–126)
BILIRUBIN DIRECT: 0.1 mg/dL (ref 0.1–0.5)
BILIRUBIN INDIRECT: 0.6 mg/dL (ref 0.3–0.9)
TOTAL PROTEIN: 6.7 g/dL (ref 6.5–8.1)
Total Bilirubin: 0.7 mg/dL (ref 0.3–1.2)

## 2015-05-30 LAB — URINALYSIS, ROUTINE W REFLEX MICROSCOPIC
BILIRUBIN URINE: NEGATIVE
Glucose, UA: NEGATIVE mg/dL
KETONES UR: NEGATIVE mg/dL
Leukocytes, UA: NEGATIVE
NITRITE: NEGATIVE
PROTEIN: NEGATIVE mg/dL
Specific Gravity, Urine: 1.012 (ref 1.005–1.030)
pH: 6 (ref 5.0–8.0)

## 2015-05-30 LAB — GLUCOSE, CAPILLARY: Glucose-Capillary: 103 mg/dL — ABNORMAL HIGH (ref 65–99)

## 2015-05-30 LAB — COMPREHENSIVE METABOLIC PANEL
ALBUMIN: 3.4 g/dL — AB (ref 3.5–5.0)
ALK PHOS: 55 U/L (ref 38–126)
ALT: 23 U/L (ref 14–54)
AST: 51 U/L — ABNORMAL HIGH (ref 15–41)
Anion gap: 14 (ref 5–15)
BUN: 9 mg/dL (ref 6–20)
CALCIUM: 8.3 mg/dL — AB (ref 8.9–10.3)
CO2: 22 mmol/L (ref 22–32)
CREATININE: 0.65 mg/dL (ref 0.44–1.00)
Chloride: 100 mmol/L — ABNORMAL LOW (ref 101–111)
GFR calc Af Amer: >60 mL/min (ref >60)
GFR calc non Af Amer: >60 mL/min (ref >60)
GLUCOSE: 136 mg/dL — AB (ref 65–99)
Potassium: 4 mmol/L (ref 3.5–5.1)
SODIUM: 136 mmol/L (ref 135–145)
Total Bilirubin: 1 mg/dL (ref 0.3–1.2)
Total Protein: 6.1 g/dL — ABNORMAL LOW (ref 6.5–8.1)

## 2015-05-30 LAB — SAMPLE TO BLOOD BANK

## 2015-05-30 LAB — PHENYTOIN LEVEL, TOTAL: Phenytoin Lvl: 5.8 ug/mL — ABNORMAL LOW (ref 10.0–20.0)

## 2015-05-30 LAB — ETHANOL

## 2015-05-30 LAB — URINE MICROSCOPIC-ADD ON

## 2015-05-30 LAB — MRSA PCR SCREENING: MRSA by PCR: NEGATIVE

## 2015-05-30 LAB — TYPE AND SCREEN
ABO/RH(D): B POS
Antibody Screen: NEGATIVE

## 2015-05-30 LAB — APTT: aPTT: 25 seconds (ref 24–37)

## 2015-05-30 MED ORDER — VITAMIN B-1 100 MG PO TABS
100.0000 mg | ORAL_TABLET | Freq: Every day | ORAL | Status: DC
  Administered 2015-05-30: 100 mg via ORAL
  Administered 2015-06-01: 09:00:00 via ORAL
  Filled 2015-05-30 (×3): qty 1

## 2015-05-30 MED ORDER — ATENOLOL 50 MG PO TABS
25.0000 mg | ORAL_TABLET | Freq: Every day | ORAL | Status: DC
  Administered 2015-05-30 – 2015-06-01 (×2): 25 mg via ORAL
  Filled 2015-05-30 (×3): qty 1

## 2015-05-30 MED ORDER — SODIUM CHLORIDE 0.9 % IV SOLN
50.0000 ug/h | INTRAVENOUS | Status: DC
  Administered 2015-05-30 – 2015-05-31 (×3): 50 ug/h via INTRAVENOUS
  Filled 2015-05-30 (×7): qty 1

## 2015-05-30 MED ORDER — VITAMIN K1 10 MG/ML IJ SOLN
10.0000 mg | Freq: Once | INTRAVENOUS | Status: AC
  Administered 2015-05-30: 10 mg via INTRAVENOUS
  Filled 2015-05-30: qty 1

## 2015-05-30 MED ORDER — ALBUTEROL SULFATE (2.5 MG/3ML) 0.083% IN NEBU: 2.5000 mg | INHALATION_SOLUTION | RESPIRATORY_TRACT | Status: DC | PRN

## 2015-05-30 MED ORDER — LORAZEPAM 1 MG PO TABS
1.0000 mg | ORAL_TABLET | Freq: Four times a day (QID) | ORAL | Status: DC | PRN
  Administered 2015-06-01: 1 mg via ORAL
  Filled 2015-05-30: qty 1

## 2015-05-30 MED ORDER — FLUTICASONE PROPIONATE 50 MCG/ACT NA SUSP
2.0000 | Freq: Every day | NASAL | Status: DC
  Filled 2015-05-30 (×2): qty 16

## 2015-05-30 MED ORDER — SODIUM CHLORIDE 0.9% FLUSH: 10.0000 mL | Freq: Two times a day (BID) | INTRAVENOUS | Status: DC

## 2015-05-30 MED ORDER — PHENYTOIN SODIUM EXTENDED 100 MG PO CAPS
300.0000 mg | ORAL_CAPSULE | Freq: Every day | ORAL | Status: DC
  Administered 2015-05-30 – 2015-05-31 (×2): 300 mg via ORAL
  Filled 2015-05-30 (×2): qty 3

## 2015-05-30 MED ORDER — ACETAMINOPHEN 650 MG RE SUPP: 650.0000 mg | Freq: Four times a day (QID) | RECTAL | Status: DC | PRN

## 2015-05-30 MED ORDER — MORPHINE SULFATE (PF) 4 MG/ML IV SOLN
4.0000 mg | Freq: Once | INTRAVENOUS | Status: AC
  Administered 2015-05-30: 4 mg via INTRAMUSCULAR
  Filled 2015-05-30: qty 1

## 2015-05-30 MED ORDER — ACETAMINOPHEN 325 MG PO TABS: 650.0000 mg | ORAL_TABLET | Freq: Four times a day (QID) | ORAL | Status: DC | PRN

## 2015-05-30 MED ORDER — FOLIC ACID 1 MG PO TABS
1.0000 mg | ORAL_TABLET | Freq: Every day | ORAL | Status: DC
  Administered 2015-05-30 – 2015-06-01 (×2): 1 mg via ORAL
  Filled 2015-05-30 (×3): qty 1

## 2015-05-30 MED ORDER — ADULT MULTIVITAMIN W/MINERALS CH
1.0000 | ORAL_TABLET | Freq: Every day | ORAL | Status: DC
  Administered 2015-05-30 – 2015-06-01 (×2): 1 via ORAL
  Filled 2015-05-30 (×3): qty 1

## 2015-05-30 MED ORDER — SODIUM CHLORIDE 0.9 % IV SOLN
INTRAVENOUS | Status: DC
  Administered 2015-05-30 – 2015-05-31 (×2): via INTRAVENOUS

## 2015-05-30 MED ORDER — SODIUM CHLORIDE 0.9% FLUSH
3.0000 mL | Freq: Two times a day (BID) | INTRAVENOUS | Status: DC
  Administered 2015-05-30 (×2): 3 mL via INTRAVENOUS

## 2015-05-30 MED ORDER — PANTOPRAZOLE SODIUM 40 MG IV SOLR
80.0000 mg | Freq: Once | INTRAVENOUS | Status: AC
  Administered 2015-05-30: 80 mg via INTRAVENOUS
  Filled 2015-05-30: qty 80

## 2015-05-30 MED ORDER — OCTREOTIDE LOAD VIA INFUSION
50.0000 ug | Freq: Once | INTRAVENOUS | Status: DC
  Filled 2015-05-30: qty 25

## 2015-05-30 MED ORDER — DM-GUAIFENESIN ER 30-600 MG PO TB12
1.0000 | ORAL_TABLET | Freq: Two times a day (BID) | ORAL | Status: DC
  Administered 2015-05-30 – 2015-06-01 (×4): 1 via ORAL
  Filled 2015-05-30 (×5): qty 1

## 2015-05-30 MED ORDER — PANCRELIPASE (LIP-PROT-AMYL) 12000-38000 UNITS PO CPEP
1.0000 | ORAL_CAPSULE | Freq: Three times a day (TID) | ORAL | Status: DC
  Administered 2015-05-30 – 2015-06-01 (×6): 12000 [IU] via ORAL
  Filled 2015-05-30 (×6): qty 1

## 2015-05-30 MED ORDER — PHENYTOIN SODIUM EXTENDED 100 MG PO CAPS: 300.0000 mg | ORAL_CAPSULE | Freq: Every day | ORAL | Status: DC

## 2015-05-30 MED ORDER — ONDANSETRON HCL 4 MG/2ML IJ SOLN: 4.0000 mg | Freq: Three times a day (TID) | INTRAMUSCULAR | Status: DC | PRN

## 2015-05-30 MED ORDER — SODIUM CHLORIDE 0.9 % IV SOLN
8.0000 mg/h | INTRAVENOUS | Status: DC
  Administered 2015-05-30 – 2015-05-31 (×4): 8 mg/h via INTRAVENOUS
  Filled 2015-05-30 (×8): qty 80

## 2015-05-30 MED ORDER — SODIUM CHLORIDE 0.9% FLUSH
10.0000 mL | INTRAVENOUS | Status: DC | PRN
  Administered 2015-06-01: 20 mL
  Filled 2015-05-30: qty 40

## 2015-05-30 MED ORDER — MORPHINE SULFATE (PF) 4 MG/ML IV SOLN: 4.0000 mg | Freq: Once | INTRAVENOUS | Status: DC

## 2015-05-30 MED ORDER — LORAZEPAM 2 MG/ML IJ SOLN
0.0000 mg | Freq: Four times a day (QID) | INTRAMUSCULAR | Status: AC
  Administered 2015-05-30: 2 mg via INTRAVENOUS
  Filled 2015-05-30 (×2): qty 1

## 2015-05-30 MED ORDER — THIAMINE HCL 100 MG/ML IJ SOLN
100.0000 mg | Freq: Every day | INTRAMUSCULAR | Status: DC
  Filled 2015-05-30: qty 2

## 2015-05-30 MED ORDER — OXYCODONE-ACETAMINOPHEN 5-325 MG PO TABS
1.0000 | ORAL_TABLET | ORAL | Status: DC | PRN
  Administered 2015-05-30: 1 via ORAL
  Filled 2015-05-30: qty 1

## 2015-05-30 MED ORDER — LORAZEPAM 2 MG/ML IJ SOLN
1.0000 mg | Freq: Four times a day (QID) | INTRAMUSCULAR | Status: DC | PRN
  Administered 2015-05-30: 1 mg via INTRAVENOUS

## 2015-05-30 MED ORDER — LORAZEPAM 2 MG/ML IJ SOLN: 0.0000 mg | Freq: Two times a day (BID) | INTRAMUSCULAR | Status: DC

## 2015-05-30 MED ORDER — PHENYTOIN SODIUM 50 MG/ML IJ SOLN
1000.0000 mg | Freq: Once | INTRAMUSCULAR | Status: AC
  Administered 2015-05-30: 1000 mg via INTRAVENOUS
  Filled 2015-05-30: qty 20

## 2015-05-30 NOTE — Progress Notes (Signed)
Arrived to find that the pt has a PIV in the left hand started by another nurse. [place] Pizza

## 2015-05-30 NOTE — ED Notes (Signed)
Called and spoke with Dr. Blaine Hamper, told him about pt's situation with IV's and hard sticks and IV team delays. Pt currently only has #24G, Dr. Blaine Hamper wants PICC placed.

## 2015-05-30 NOTE — Progress Notes (Addendum)
Initial Nutrition Assessment  DOCUMENTATION CODES:   Underweight  INTERVENTION:   Advance diet as medically appropriate, RD to add supplements accordingly  NUTRITION DIAGNOSIS:   Increased nutrient needs related to chronic illness as evidenced by estimated needs  GOAL:   Patient will meet greater than or equal to 90% of their needs  MONITOR:   Diet advancement, PO intake, Labs, Weight trends, I & O's  REASON FOR ASSESSMENT:   Consult Assessment of nutrition requirement/status  ASSESSMENT:   55 y.o. Female with medical history significant of medication noncompliance, seizure, hypertension, asthma, GI bleeding, pancreatitis, colitis, alcohol abuse, chronic back pain, who presents with seizure and tarry stool.  RD unable to obtain nutrition hx of complete nutrition focused physical exam at this time. Sterile procedure in process (PICC line placement). Pt with hx of poor PO intake and weight loss. She is underweight for height >> suspect malnutrition. GI note reviewed >> plan is for EGD possibly today.  Diet Order:  Diet NPO time specified Except for: Sips with Meds  Skin:  Reviewed, no issues  Last BM:  4/25  Height:   Ht Readings from Last 1 Encounters:  05/30/15 5\' 3"  (1.6 m)    Weight:   Wt Readings from Last 1 Encounters:  05/30/15 102 lb 11.8 oz (46.6 kg)    Ideal Body Weight:  55.4 kg  BMI:  Body mass index is 18.2 kg/(m^2).  Estimated Nutritional Needs:   Kcal:  1200-1400  Protein:  65-75 gm  Fluid:  >/= 1.5 L  EDUCATION NEEDS:   No education needs identified at this time  Arthur Holms, RD, LDN Pager #: [phone] After-Hours Pager #: [phone]

## 2015-05-30 NOTE — Consult Note (Signed)
Brandy Bishop: 8:25 AM 05/30/2015     Referring Provider: Dr Charlies Silvers  Primary Care Physician: [place].  Primary Gastroenterologist:  Althia Forts.  Never followed up with GI after 04/2014 admission.     Reason for Consultation:  Melena, amenia.     HPI: Brandy Bishop is a 55 y.o. female. Alcoholic and hx polysubstance abuse.  Alcoholic hepatitis dating to at least 1999. Seizure disorder, asthma. Fatty liver on CT of 2011. Hepatitis panel for ABC negative in 04/2014. Chronic pancreatitis changes seen on CT scans dating back to 2003. Hx pyelonephritis.  Thrombocytopenia. Anemia.  Protein calorie malnutrition.  Chronic back pain.  Pan colitis on CT in 04/2014. Had bloody stool, abdominal pain.  Sxs resolved and she discharged 04/16/14.  No showed for 05/02/15 GI office follow up.    Seen in ED 10/2014 and weight had dropped from 124# (05/2012 - 04/2014) to 98#.  Now 102#.  Seen in ED with 2 months of abdominal pain.  She was intoxicated, tox + for cocaine.  Upper abdominal lipoma on exam. Lipase normal.   02/25/15 CT showed.   Diffuse colonic diverticulosis. Mildly increased attenuation in the pericolonic fat near the splenic flexure, in an area that is not particularly demonstrative of diverticulosis. This finding is nonspecific and could reflect mild diverticulitis or nonspecific colitis. No complicating features identified to suggest abscess or perforation. Although the colon did appear diffusely inflamed on the prior study, this area of mesenteric attenuation was not present at that time. Additionally, the colon does not currently demonstrate features of diffuse inflammation as it did previously. There is again evidence of chronic pancreatitis.  Came to ED after having at least 2 seizures of up  to 10 minutes duration.  Tarry stools began 4/23, 3 in ED but none since 0200 today.  No seizure since arrival.  BPs 120s-130s/50s-80s, pulses 70s - 90s at arrival.  Currently 140s to 160s, 70s to low 100s.   Hgb 12.6 10/2014, 10.8 on 02/25/2015, 8.5 on 05/29/15 and 7.5 today.   Platelets 98.  Coags WNL.  BUN not elevated.   AST/ALT 52/25.  Normal T bili, alk phos.  Lactic acid 2.18. ETOH <5.   Bilious emesis in ED.  No abd pain.  Appetite generally good.  Stools previously formed, daily, brown.  No dysphagia.  No nose bleeds.  Uses Aleve infrequently for headaches, last use ~ 2 weeks ago.  Drinks 40 to 80 oz malt liquor daily.  Last consumed last 4/24 in AM.    Past Medical History  Diagnosis Date  . Asthma   . Hypertension   . Pancreatitis     Chronic pancreatitis noted on CT scan in 04/2014.  . Colitis 04/2014.    Bloody diarrhea  . Alcoholism ([place]) 2011  . Polysubstance abuse 2011  . Pyelonephritis 05/2012  . GIB (gastrointestinal bleeding)   . Daily headache   . Seizures ([place])     "don't know what they are from" (04/14/2014)  . Chronic lower back pain     Past Surgical History  Procedure Laterality Date  . No past surgeries      Prior to Admission medications   Medication Sig Start Date End Date Taking? Authorizing Provider  atenolol (TENORMIN) 25 MG tablet Take 2 tablets (50 mg total) by mouth daily. Patient taking differently: Take 25 mg by mouth daily.  10/15/14  Yes Hope Bunnie Pion, NP  lipase/protease/amylase (CREON-10/PANCREASE) 12000 UNITS CPEP Take 1 capsule by mouth 3 (three) times daily before meals. 05/11/12  Yes Belkys A Regalado, MD  phenytoin (DILANTIN) 100 MG ER capsule Take 3 capsules (300 mg total) by mouth at bedtime. 06/26/12  Yes John Molpus, MD  albuterol (PROVENTIL HFA;VENTOLIN HFA) 108 (90 BASE) MCG/ACT inhaler Inhale 2 puffs into the lungs once. Patient not taking: Reported on 04/14/2014 07/06/12   Marin Olp, MD  azithromycin (ZITHROMAX) 250 MG tablet Take  1 tablet (250 mg total) by mouth daily. Take first 2 tablets together, then 1 every day until finished. 02/25/15   Okey Regal, PA-C  feeding supplement, RESOURCE BREEZE, (RESOURCE BREEZE) LIQD Take 1 Container by mouth 3 (three) times daily between meals. Patient not taking: Reported on 02/25/2015 04/16/14   Lucious Groves, DO  folic acid (FOLVITE) 1 MG tablet Take 1 tablet (1 mg total) by mouth daily. Patient not taking: Reported on 02/25/2015 04/16/14   Lucious Groves, DO  Multiple Vitamin (MULTIVITAMIN WITH MINERALS) TABS Take 1 tablet by mouth daily. Patient not taking: Reported on 04/14/2014 05/11/12   Belkys A Regalado, MD  thiamine 100 MG tablet Take 1 tablet (100 mg total) by mouth daily. Patient not taking: Reported on 02/25/2015 04/16/14   Lucious Groves, DO    Scheduled Meds: . atenolol  25 mg Oral Daily  . dextromethorphan-guaiFENesin  1 tablet Oral BID  . fluticasone  2 spray Each Nare Daily  . folic acid  1 mg Oral Daily  . lipase/protease/amylase  1 capsule Oral TID AC  . LORazepam  0-4 mg Intravenous Q6H   Followed by  . [START ON 06/01/2015] LORazepam  0-4 mg Intravenous Q12H  . multivitamin with minerals  1 tablet Oral Daily  . octreotide  50 mcg Intravenous Once  . phenytoin (DILANTIN) IV  1,000 mg Intravenous Once  . phenytoin  300 mg Oral QHS  . sodium chloride flush  3 mL Intravenous Q12H  . thiamine  100 mg Oral Daily   Or  . thiamine  100 mg Intravenous Daily   Infusions: . sodium chloride    . octreotide  (SANDOSTATIN)    IV infusion    . pantoprozole (PROTONIX) infusion 8 mg/hr (05/30/15 0435)   PRN Meds: acetaminophen **OR** acetaminophen, albuterol, LORazepam **OR** LORazepam, ondansetron, oxyCODONE-acetaminophen   Allergies as of 05/29/2015 - Review Complete 05/29/2015  Allergen Reaction Noted  . Penicillins Swelling 03/15/2011    Family History  Problem Relation Age of Onset  . Cancer Mother   . Cancer Brother     Social History   Social  History  . Marital Status: Single    Spouse Name: N/A  . Number of Children: N/A  . Years of Education: N/A   Occupational History  . Not on file.   Social History Main Topics  . Smoking status: Never Smoker   . Smokeless tobacco: Never Used  . Alcohol Use: 6.0 oz/week    10 Cans of beer per week     Comment: 04/14/2014 "I drink all of them; I'm not drinking anything anymore"  . Drug Use: No  .  Sexual Activity: No   Other Topics Concern  . Not on file   Social History Narrative   Lives in [place].    Disabled due to seizures.    Stay with a friend.    Single. No kids.     REVIEW OF SYSTEMS: Constitutional:  Generally not fatigued.   ENT:  No nose bleeds Pulm:  No COB or cough CV:  No palpitations, no LE edema.  GU:  No hematuria, no frequency GI:  Per HPI Heme:  No unusual bleeding or bruising   Transfusions:  none Neuro: occasional headaches, no peripheral tingling or numbness Derm:  No itching, no rash or sores.  Endocrine:  No sweats or chills.  No polyuria or dysuria Immunization:  No queried.  Travel:  None beyond local counties in last few months.    PHYSICAL EXAM: Vital signs in last 24 hours: Filed Vitals:   05/30/15 0645 05/30/15 0700  BP: 161/92 163/98  Pulse: 101 101  Temp:    Resp: 14 18   Wt Readings from Last 3 Encounters:  05/30/15 46.6 kg (102 lb 11.8 oz)  10/15/14 44.566 kg (98 lb 4 oz)  04/15/14 57.698 kg (127 lb 3.2 oz)    General: thin, chronically ill looking.  Comfortable and alert Head:  No swelling or asymmetry.  No trauma  Eyes:  No icterus or pallor Ears:  Not HOH  Nose:  No discharge or congestion Mouth:  Clear and moist.   Neck:  No mass, no JVD or TMG Lungs:  Clear bil.  No cough or dyspnea Heart: RRR.  No mrg.  S1/s2 present Abdomen:  Soft, NT, ND.  Active BS.  Not protuberant.  Active BS.  Quarter-sized sub Q mass just distal to xyphoid.   Rectal: deferred   Musc/Skeltl: no contractures or swelling Extremities:  No CCE    Neurologic:  Oriented x 3.  cooperative Skin:  No rash, or sores Psych:  Cooperative and calm.    Intake/Output from previous day:   Intake/Output this shift:    LAB RESULTS:  Recent Labs  05/29/15 2352 05/30/15 0241  WBC 6.0 4.9  HGB 8.5* 7.5*  HCT 24.8* 22.6*  PLT 105* 98*   BMET Lab Results  Component Value Date   NA 136 05/30/2015   NA 137 05/29/2015   NA 140 02/25/2015   K 4.0 05/30/2015   K 4.3 05/29/2015   K 3.8 02/25/2015   CL 100* 05/30/2015   CL 102 05/29/2015   CL 98* 02/25/2015   CO2 22 05/30/2015   CO2 22 05/29/2015   CO2 27 02/25/2015   GLUCOSE 136* 05/30/2015   GLUCOSE 118* 05/29/2015   GLUCOSE 81 02/25/2015   BUN 9 05/30/2015   BUN 11 05/29/2015   BUN 10 02/25/2015   CREATININE 0.65 05/30/2015   CREATININE 0.60 05/29/2015   CREATININE 0.62 02/25/2015   CALCIUM 8.3* 05/30/2015   CALCIUM 8.7* 05/29/2015   CALCIUM 8.7* 02/25/2015   LFT  Recent Labs  05/29/15 2352 05/30/15 0241  PROT 6.7 6.1*  ALBUMIN 3.6 3.4*  AST 52* 51*  ALT 25 23  ALKPHOS 62 55  BILITOT 0.7 1.0  BILIDIR 0.1  --   IBILI 0.6  --    PT/INR Lab Results  Component Value Date   INR 1.04 05/30/2015   INR 1.09 04/14/2014   INR 0.91 11/29/2009    Drugs of Abuse     Component Value Date/Time   LABOPIA NONE DETECTED 02/25/2015 1813  LABOPIA NEG 05/31/2009 2246   COCAINSCRNUR POSITIVE* 02/25/2015 1813   COCAINSCRNUR POS* 05/31/2009 2246   LABBENZ NONE DETECTED 02/25/2015 1813   LABBENZ NEG 05/31/2009 2246   AMPHETMU NONE DETECTED 02/25/2015 1813   AMPHETMU NEG 05/31/2009 2246   THCU NONE DETECTED 02/25/2015 1813   LABBARB NONE DETECTED 02/25/2015 1813     RADIOLOGY STUDIES: Ct Head Wo Contrast  05/30/2015  CLINICAL DATA:  Acute onset of seizure.  Initial encounter. EXAM: CT HEAD WITHOUT CONTRAST TECHNIQUE: Contiguous axial images were obtained from the base of the skull through the vertex without intravenous contrast. COMPARISON:  CT of the head performed  10/24/2012 FINDINGS: There is no evidence of acute infarction, mass lesion, or intra- or extra-axial hemorrhage on CT. Prominence of the ventricles and sulci reflects mild to moderate cortical volume loss. 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 Mild periventricular and subcortical white matter change likely reflects small vessel ischemic microangiopathy. The brainstem and fourth ventricle are within normal limits. The basal ganglia are unremarkable in appearance. No mass effect or midline shift is seen. There is no evidence of fracture; visualized osseous structures are unremarkable in appearance. The visualized portions of the orbits are within normal limits. The paranasal sinuses and mastoid air cells are well-aerated. No significant soft tissue abnormalities are seen. IMPRESSION: 1. No acute intracranial pathology seen on CT. 2. Mild to moderate cortical volume loss and scattered small vessel ischemic microangiopathy. 3. 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 Electronically Signed   By: Garald Balding M.D.   On: 05/30/2015 00:26   Dg Chest Port 1 View  05/30/2015  CLINICAL DATA:  Status post seizure.  Cough.  Initial encounter. EXAM: PORTABLE CHEST 1 VIEW COMPARISON:  Chest radiograph performed 02/25/2015 FINDINGS: The lungs are well-aerated and clear. There is no evidence of focal opacification, pleural effusion or pneumothorax. The cardiomediastinal silhouette is within normal limits. No acute osseous abnormalities are seen. IMPRESSION: No acute cardiopulmonary process seen. Electronically Signed   By: Garald Balding M.D.   On: 05/30/2015 02:35    ENDOSCOPIC STUDIES: none  IMPRESSION:   *  Tarry stools, c/w UGIB in alcoholic with known fatty liver and ongoing ETOH abuse.   Rule out ulcer vs portal gastropathy vs varices vs neoplasia.    *  Blood loss anemia  *  Thrombocytopenia.  Fortunately PT/INR  are WNL.    *  Seizure disorder, active seizures PTA.  Head Ct with small vessel dz, chronic left parietal infarct, enciphalmalacia.   *  Chronic alcoholism. Mild elevation AST.    *  Bloody colitis 04/2014.  Never followed up with GI.  Follow up CT 02/2015 showed resolution of colitis.   *  IV access problems.  Only 1 periph IV at present with Protonix drip infusing.  Octreotide gtt ordered but no access for this  *  Weight loss as of 10/2014.  Current weight is up 4# from then    PLAN:     *  EGD, with MAC, set for 10:30 tomorrow (no MAC available for today and pt is stable)    *  Challenging IV access,  PICC line placed.   *  ok to have clears, npo after midnight.   *  Needs Repeat CBC, was due at 0800 today but not yet collected.    Azucena Freed  05/30/2015, 8:25 AM Pager: [phone]  GI ATTENDING  History,laboratories, x-rays reviewed. Patient seen  and examined. Agree with comprehensive consultation note as outlined above. Complicated patient with multiple problems related and unrelated to polysubstance abuse and severe alcoholism. Presents with seizures. Reports several days of tarry stools.Drifting hemoglobin.Patient is hemodynamically stable with normal BUN. Did take some NSAIDs. Agree with obtaining good IV access, IV PPI, and IV octreotide empirically. No known varices but does have alcoholic liver disease.Plans for EGD in a.m. With MAC.She is high-risk.The nature of the procedure, as well as the risks, benefits, and alternatives were carefully and thoroughly reviewed with the patient. Ample time for discussion and questions allowed. The patient understood, was satisfied, and agreed to proceed.  Docia Chuck. Geri Seminole., M.D. [facility] Division of Gastroenterology

## 2015-05-30 NOTE — ED Notes (Signed)
This RN attempted IV start, but unsuccessful. IV team paged, told them pt was hard stick and needed multiple drugs and a second line.

## 2015-05-30 NOTE — Progress Notes (Signed)
Patient ID: Brandy Bishop, female   DOB: 1960/11/15, 55 y.o.   MRN: [id number]  PROGRESS NOTE    Brandy Bishop  [id number] DOB: 08/07/1960 DOA: 05/29/2015  PCP: No PCP Per Patient will set up with community health wellness on discharge  Outpatient Specialists: None  Brief Narrative:   Patient admitted after midnight. For details on history of present illness and assessment and plan please refer to admission note completed 05/30/2015.  55 y.o. female with medical history significant of medication noncompliance, seizure, hypertension, asthma, GI bleeding, pancreatitis, colitis, alcohol abuse, chronic back pain who presented to [facility] ED with seizures as well as black tarry stool. In regards to seizures, patient's boyfriend witnessed 2 episodes of seizures each lasting about 10 minutes, tonic-clonic movement and then confusion after the seizure. Patient reports not taking phenytoin consistently. On the morning of this admission she had 7 episodes of tarry stool but no associated diarrhea. No fevers. She had some nausea but no vomiting. Patient also reports drinking alcohol daily.   In ED, patient was hemodynamically stable. Her phenytoin level was subtherapeutic at 5.8, alcohol level was less than 5. Hemoglobin was 10.8 with further drop down to 7.5. Chest x-ray showed no acute cardiopulmonary findings. GI has seen the patient in consultation.   Assessment & Plan:   Principal Problem: Seizure disorder ([place]) - Likely related to noncompliance with medication as well as related to alcohol withdrawal seizures - We resumed phenytoin, patient received phenytoin 1000 mg loading dose and then we continued 300 mg by mouth at bedtime - Patient is also on CIWA protocol - Monitor for seizures on telemetry  Active Problems: Acute upper GI bleed - Appreciate GI input - Patient is on octreotide drip and Protonix drip - Hemoglobin is 7.5, 7.8. We'll see with the GI if transfusion required  at this time. Otherwise we'll transfuse for hemoglobin of less than 7  Alcohol abuse - Placed on CIWA protocol - Continue IV fluids, multivitamin, thiamine and folic acid  - Monitor for withdrawals   Underweight  - Body mass index is 18.2 kg/(m^2). - Seen by nutrition   DVT prophylaxis: SCDs bilaterally due to risk of bleeding Code Status: full code  Family Communication: Family not at the bedside Disposition Plan: Home once stable from GI standpoint. Anticipate discharge by 06/01/2015   Consultants:   GI  Procedures:   None   Antimicrobials:   None    Subjective: No events since the admission. No vomiting.  Objective: Filed Vitals:   05/30/15 0645 05/30/15 0700 05/30/15 0800 05/30/15 0900  BP: 161/92 163/98 149/98 161/89  Pulse: 101 101 79 75  Temp:      TempSrc:      Resp: 14 18 18 15   Height:   5\' 3"  (1.6 m)   Weight:   46.6 kg (102 lb 11.8 oz)   SpO2: 100% 100% 100% 100%    Intake/Output Summary (Last 24 hours) at 05/30/15 1120 Last data filed at 05/30/15 0800  Gross per 24 hour  Intake  85.42 ml  Output      0 ml  Net  85.42 ml   Filed Weights   05/30/15 0800  Weight: 46.6 kg (102 lb 11.8 oz)    Examination:  General exam: Appears calm and comfortable  Respiratory system: Clear to auscultation. Respiratory effort normal. Cardiovascular system: S1 & S2 heard, RRR. No JVD, murmurs, rubs, gallops or clicks. No pedal edema. Gastrointestinal system: Abdomen is nondistended, soft and nontender. No  organomegaly or masses felt. Normal bowel sounds heard. Central nervous system: Alert and oriented. No focal neurological deficits. Extremities: Symmetric 5 x 5 power. Skin: No rashes, lesions or ulcers Psychiatry: Judgement and insight appear normal. Mood & affect appropriate.   Data Reviewed: I have personally reviewed following labs and imaging studies  CBC:  Recent Labs Lab 05/29/15 2352 05/30/15 0241  WBC 6.0 4.9  NEUTROABS 4.0  --   HGB  8.5* 7.5*  HCT 24.8* 22.6*  MCV 95.4 94.6  PLT 105* 98*   Basic Metabolic Panel:  Recent Labs Lab 05/29/15 2352 05/30/15 0241  NA 137 136  K 4.3 4.0  CL 102 100*  CO2 22 22  GLUCOSE 118* 136*  BUN 11 9  CREATININE 0.60 0.65  CALCIUM 8.7* 8.3*   GFR: Estimated Creatinine Clearance: 59.1 mL/min (by C-G formula based on Cr of 0.65). Liver Function Tests:  Recent Labs Lab 05/29/15 2352 05/30/15 0241  AST 52* 51*  ALT 25 23  ALKPHOS 62 55  BILITOT 0.7 1.0  PROT 6.7 6.1*  ALBUMIN 3.6 3.4*   No results for input(s): LIPASE, AMYLASE in the last 168 hours. No results for input(s): AMMONIA in the last 168 hours. Coagulation Profile:  Recent Labs Lab 05/30/15 0022  INR 1.04   Cardiac Enzymes: No results for input(s): CKTOTAL, CKMB, CKMBINDEX, TROPONINI in the last 168 hours. BNP (last 3 results) No results for input(s): PROBNP in the last 8760 hours. HbA1C: No results for input(s): HGBA1C in the last 72 hours. CBG:  Recent Labs Lab 05/30/15 0906  GLUCAP 103*   Lipid Profile: No results for input(s): CHOL, HDL, LDLCALC, TRIG, CHOLHDL, LDLDIRECT in the last 72 hours. Thyroid Function Tests: No results for input(s): TSH, T4TOTAL, FREET4, T3FREE, THYROIDAB in the last 72 hours. Anemia Panel: No results for input(s): VITAMINB12, FOLATE, FERRITIN, TIBC, IRON, RETICCTPCT in the last 72 hours. Urine analysis:    Component Value Date/Time   COLORURINE YELLOW 05/29/2015 2331   APPEARANCEUR CLEAR 05/29/2015 2331   LABSPEC 1.012 05/29/2015 2331   PHURINE 6.0 05/29/2015 2331   GLUCOSEU NEGATIVE 05/29/2015 2331   HGBUR MODERATE* 05/29/2015 2331   BILIRUBINUR NEGATIVE 05/29/2015 2331   KETONESUR NEGATIVE 05/29/2015 2331   PROTEINUR NEGATIVE 05/29/2015 2331   UROBILINOGEN 0.2 04/14/2014 1419   NITRITE NEGATIVE 05/29/2015 2331   LEUKOCYTESUR NEGATIVE 05/29/2015 2331   Sepsis Labs: @LABRCNTIP (procalcitonin:4,lacticidven:4)   ) Recent Results (from the past 240  hour(s))  MRSA PCR Screening     Status: None   Collection Time: 05/30/15  8:35 AM  Result Value Ref Range Status   MRSA by PCR NEGATIVE NEGATIVE Final    Comment:        The GeneXpert MRSA Assay (FDA approved for NASAL specimens only), is one component of a comprehensive MRSA colonization surveillance program. It is not intended to diagnose MRSA infection nor to guide or monitor treatment for MRSA infections.       Radiology Studies: Ct Head Wo Contrast 05/30/2015   1. No acute intracranial pathology seen on CT. 2. Mild to moderate cortical volume loss and scattered small vessel ischemic microangiopathy. 3. 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 E  Dg Chest Port 1 View 05/30/2015  No acute cardiopulmonary process seen.     Scheduled Meds: . atenolol  25 mg Oral Daily  . dextromethorphan-guaiFENesin  1 tablet Oral BID  . fluticasone  2 spray Each Nare Daily  . folic acid  1 mg  Oral Daily  . lipase/protease/amylase  1 capsule Oral TID AC  . LORazepam  0-4 mg Intravenous Q6H   Followed by  . [START ON 06/01/2015] LORazepam  0-4 mg Intravenous Q12H  . multivitamin with minerals  1 tablet Oral Daily  . octreotide  50 mcg Intravenous Once  . phenytoin (DILANTIN) IV  1,000 mg Intravenous Once  . phenytoin  300 mg Oral QHS  . sodium chloride flush  10-40 mL Intracatheter Q12H  . sodium chloride flush  3 mL Intravenous Q12H  . thiamine  100 mg Oral Daily   Or  . thiamine  100 mg Intravenous Daily   Continuous Infusions: . sodium chloride    . octreotide  (SANDOSTATIN)    IV infusion    . pantoprozole (PROTONIX) infusion 8 mg/hr (05/30/15 0800)      Time spent: 25 minutes    Leisa Lenz, MD [facility] Pager [phone]  If 7PM-7AM, please contact night-coverage [email or web address] Password [facility] 05/30/2015, 11:20 AM

## 2015-05-30 NOTE — H&P (Addendum)
History and Physical    Brandy Bishop [id number] DOB: 1960/10/21 DOA: 05/29/2015  Referring MD/NP/PA:   PCP: No PCP Per Patient   Outpatient Specialists: suppose to f/u with GI, Dr. Tonye Royalty  Patient coming from:  Home  Chief Complaint: seizure and tarry stool  HPI: Brandy Bishop is a 55 y.o. female with medical history significant of medication noncompliance, seizure, hypertension, asthma, GI bleeding, pancreatitis, colitis, alcohol abuse, chronic back pain, who presents with seizure and tarry stool.  Per her fianc, patient had 2 witnessed seizures today, each one lasted for about 10 min.  As per boyfriend, she was found on the ground 2 in the kitchen, had tonic-clonic movement was out for about 10 minutes. She was confused when she woke up, no incontinence. She has not been taking her phenytoin consistently. Pt reports that she has had 7 episodes of tarry stool since this morning. She has nausea, but no vomiting or abdominal pain. She never had EGD or colonoscopy before. No NSAID use, but she drinks daily, has shakes when she doesn't drink, last alcohol intake was this AM. Patient has cold-like symptoms with dry cough and runny nose, but no sore throat, fever, chills or chest pain. Patient does not have shortness of breath. She does not have unilateral weakness, symptoms of UTI or rashes.  ED Course: pt was found to have subtherapeutic phenytoin level at 5.8, alcohol level less than 5,  hemoglobin drop from 10.8--> 8.5-->7.5. WBC 6.0, tachycardia, tachypnea, electrolytes and renal function okay, lactate 1.04, INR 1.04, negative urinalysis. Chest x-ray negative. Patients is admitted to inpatient for further expansion treatment. GI was consulted by EDP.  Can patient participate in ADLs?  Little  Review of Systems:   General: no fevers, chills, no changes in body weight, has poor appetite, has fatigue HEENT: no blurry vision, hearing changes or sore throat Pulm: no dyspnea,  coughing, wheezing CV: no chest pain, no palpitations Abd: no nausea, vomiting, abdominal pain, diarrhea, constipation. Has tarry stool. GU: no dysuria, burning on urination, increased urinary frequency, hematuria  Ext: no leg edema Neuro: no unilateral weakness, numbness, or tingling, no vision change or hearing loss. Had seizure Skin: no rash MSK: No muscle spasm, no deformity, no limitation of range of movement in spin Heme: No easy bruising.  Travel history: No recent long distant travel.  Allergy:  Allergies  Allergen Reactions  . Penicillins Swelling    .Has patient had a PCN reaction causing immediate rash, facial/tongue/throat swelling, SOB or lightheadedness with hypotension: Yes Has patient had a PCN reaction causing severe rash involving mucus membranes or skin necrosis: No Has patient had a PCN reaction that required hospitalization No Has patient had a PCN reaction occurring within the last 10 years: No If all of the above answers are "NO", then may proceed with Cephalosporin use.     Past Medical History  Diagnosis Date  . Asthma   . Hypertension   . Pancreatitis     Chronic pancreatitis noted on CT scan in 04/2014.  . Colitis 04/2014.    Bloody diarrhea  . Alcoholism ([place]) 2011  . Polysubstance abuse 2011  . Pyelonephritis 05/2012  . GIB (gastrointestinal bleeding)   . Daily headache   . Seizures ([place])     "don't know what they are from" (04/14/2014)  . Chronic lower back pain     Past Surgical History  Procedure Laterality Date  . No past surgeries      Social History:  reports that  she has never smoked. She has never used smokeless tobacco. She reports that she drinks about 6.0 oz of alcohol per week. She reports that she does not use illicit drugs.  Family History:  Family History  Problem Relation Age of Onset  . Cancer Mother   . Cancer Brother      Prior to Admission medications   Medication Sig Start Date End Date Taking? Authorizing Provider    atenolol (TENORMIN) 25 MG tablet Take 2 tablets (50 mg total) by mouth daily. Patient taking differently: Take 25 mg by mouth daily.  10/15/14  Yes Hope Bunnie Pion, NP  lipase/protease/amylase (CREON-10/PANCREASE) 12000 UNITS CPEP Take 1 capsule by mouth 3 (three) times daily before meals. 05/11/12  Yes Belkys A Regalado, MD  phenytoin (DILANTIN) 100 MG ER capsule Take 3 capsules (300 mg total) by mouth at bedtime. 06/26/12  Yes John Molpus, MD  albuterol (PROVENTIL HFA;VENTOLIN HFA) 108 (90 BASE) MCG/ACT inhaler Inhale 2 puffs into the lungs once. Patient not taking: Reported on 04/14/2014 07/06/12   Marin Olp, MD  azithromycin (ZITHROMAX) 250 MG tablet Take 1 tablet (250 mg total) by mouth daily. Take first 2 tablets together, then 1 every day until finished. 02/25/15   Okey Regal, PA-C  feeding supplement, RESOURCE BREEZE, (RESOURCE BREEZE) LIQD Take 1 Container by mouth 3 (three) times daily between meals. Patient not taking: Reported on 02/25/2015 04/16/14   Lucious Groves, DO  folic acid (FOLVITE) 1 MG tablet Take 1 tablet (1 mg total) by mouth daily. Patient not taking: Reported on 02/25/2015 04/16/14   Lucious Groves, DO  Multiple Vitamin (MULTIVITAMIN WITH MINERALS) TABS Take 1 tablet by mouth daily. Patient not taking: Reported on 04/14/2014 05/11/12   Belkys A Regalado, MD  thiamine 100 MG tablet Take 1 tablet (100 mg total) by mouth daily. Patient not taking: Reported on 02/25/2015 04/16/14   Lucious Groves, DO    Physical Exam: Filed Vitals:   05/30/15 0615 05/30/15 0630 05/30/15 0645 05/30/15 0700  BP: 146/93 146/93 161/92 163/98  Pulse: 83 83 101 101  Temp:      TempSrc:      Resp: 14 19 14 18   SpO2: 100% 100% 100% 100%   General: Not in acute distress HEENT:       Eyes: PERRL, EOMI, no scleral icterus.       ENT: No discharge from the ears and nose, no pharynx injection, no tonsillar enlargement.        Neck: No JVD, no bruit, no mass felt. Heme: No neck lymph node  enlargement. Cardiac: S1/S2, RRR, No murmurs, No gallops or rubs. Pulm:  No rales, wheezing, rhonchi or rubs. Abd: Soft, nondistended, nontender, no rebound pain, no organomegaly, BS present. GU: No hematuria Ext: No pitting leg edema bilaterally. 2+DP/PT pulse bilaterally. Musculoskeletal: No joint deformities, No joint redness or warmth, no limitation of ROM in spin. Skin: No rashes.  Neuro: Alert, oriented X3, cranial nerves II-XII grossly intact, moves all extremities normally.Marland Kitchen Psych: Patient is not psychotic, no suicidal or hemocidal ideation.  Labs on Admission: I have personally reviewed following labs and imaging studies  CBC:  Recent Labs Lab 05/29/15 2352 05/30/15 0241  WBC 6.0 4.9  NEUTROABS 4.0  --   HGB 8.5* 7.5*  HCT 24.8* 22.6*  MCV 95.4 94.6  PLT 105* 98*   Basic Metabolic Panel:  Recent Labs Lab 05/29/15 2352 05/30/15 0241  NA 137 136  K 4.3 4.0  CL 102 100*  CO2 22 22  GLUCOSE 118* 136*  BUN 11 9  CREATININE 0.60 0.65  CALCIUM 8.7* 8.3*   GFR: CrCl cannot be calculated (Unknown ideal weight.). Liver Function Tests:  Recent Labs Lab 05/29/15 2352 05/30/15 0241  AST 52* 51*  ALT 25 23  ALKPHOS 62 55  BILITOT 0.7 1.0  PROT 6.7 6.1*  ALBUMIN 3.6 3.4*   No results for input(s): LIPASE, AMYLASE in the last 168 hours. No results for input(s): AMMONIA in the last 168 hours. Coagulation Profile:  Recent Labs Lab 05/30/15 0022  INR 1.04   Cardiac Enzymes: No results for input(s): CKTOTAL, CKMB, CKMBINDEX, TROPONINI in the last 168 hours. BNP (last 3 results) No results for input(s): PROBNP in the last 8760 hours. HbA1C: No results for input(s): HGBA1C in the last 72 hours. CBG: No results for input(s): GLUCAP in the last 168 hours. Lipid Profile: No results for input(s): CHOL, HDL, LDLCALC, TRIG, CHOLHDL, LDLDIRECT in the last 72 hours. Thyroid Function Tests: No results for input(s): TSH, T4TOTAL, FREET4, T3FREE, THYROIDAB in the  last 72 hours. Anemia Panel: No results for input(s): VITAMINB12, FOLATE, FERRITIN, TIBC, IRON, RETICCTPCT in the last 72 hours. Urine analysis:    Component Value Date/Time   COLORURINE YELLOW 05/29/2015 2331   APPEARANCEUR CLEAR 05/29/2015 2331   LABSPEC 1.012 05/29/2015 2331   PHURINE 6.0 05/29/2015 2331   GLUCOSEU NEGATIVE 05/29/2015 2331   HGBUR MODERATE* 05/29/2015 2331   BILIRUBINUR NEGATIVE 05/29/2015 2331   KETONESUR NEGATIVE 05/29/2015 2331   PROTEINUR NEGATIVE 05/29/2015 2331   UROBILINOGEN 0.2 04/14/2014 1419   NITRITE NEGATIVE 05/29/2015 2331   LEUKOCYTESUR NEGATIVE 05/29/2015 2331   Sepsis Labs: @LABRCNTIP (procalcitonin:4,lacticidven:4) )No results found for this or any previous visit (from the past 240 hour(s)).   Radiological Exams on Admission: Ct Head Wo Contrast  05/30/2015  CLINICAL DATA:  Acute onset of seizure.  Initial encounter. EXAM: CT HEAD WITHOUT CONTRAST TECHNIQUE: Contiguous axial images were obtained from the base of the skull through the vertex without intravenous contrast. COMPARISON:  CT of the head performed 10/24/2012 FINDINGS: There is no evidence of acute infarction, mass lesion, or intra- or extra-axial hemorrhage on CT. Prominence of the ventricles and sulci reflects mild to moderate cortical volume loss. 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 Mild periventricular and subcortical white matter change likely reflects small vessel ischemic microangiopathy. The brainstem and fourth ventricle are within normal limits. The basal ganglia are unremarkable in appearance. No mass effect or midline shift is seen. There is no evidence of fracture; visualized osseous structures are unremarkable in appearance. The visualized portions of the orbits are within normal limits. The paranasal sinuses and mastoid air cells are well-aerated. No significant soft tissue abnormalities are seen. IMPRESSION: 1. No acute  intracranial pathology seen on CT. 2. Mild to moderate cortical volume loss and scattered small vessel ischemic microangiopathy. 3. 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 Electronically Signed   By: Garald Balding M.D.   On: 05/30/2015 00:26   Dg Chest Port 1 View  05/30/2015  CLINICAL DATA:  Status post seizure.  Cough.  Initial encounter. EXAM: PORTABLE CHEST 1 VIEW COMPARISON:  Chest radiograph performed 02/25/2015 FINDINGS: The lungs are well-aerated and clear. There is no evidence of focal opacification, pleural effusion or pneumothorax. The cardiomediastinal silhouette is within normal limits. No acute osseous abnormalities are seen. IMPRESSION: No acute cardiopulmonary process seen. Electronically Signed   By: Jacqulynn Cadet  Chang M.D.   On: 05/30/2015 02:35     EKG: Not done in ED, will get one.   Assessment/Plan Principal Problem:   GI bleed Active Problems:   Seizure disorder (HCC)   Asthma with bronchitis   HTN (hypertension)   Alcohol abuse   Chronic pancreatitis (HCC)   Protein-calorie malnutrition, severe (HCC)   Thrombocytopenia (HCC)   Seizure (HCC)   GI bleed: pt was admitted last month due to GIB. She did not require transfusion.per discharge summery, an abdominal CT was obtained on admission which suggested pan colitis. A GI pathogen panel was obtained but empiric antibiotics were not started due to lack of diarrhea.GI was consulted and recommend supportive care in that admission.She supposed to f/u with GI,  Dr. Hilarie Fredrickson. Now has recurrent GIB. hgb dropped from 10.8-->7.5. Currently hemodynamically stable. EDP discussed with the on-call GI, Dr. Loletha Carrow, who recommended to start patient with Protonix and octreotide by IV. GI, Dr. Alvester Chou will see patient in the morning. 10 mg of Vk was given by EDP.   - will admit to SDU for obs - IV protonix and octreotide - GI consulted by Ed, will follow up recommendations - NPO  - IVF: 1L NS and  then 125 mL/hr - Zofran IV for nausea - Avoid NSAIDs and SQ heparin - Maintain IV access (2 large bore IVs if possible). - Monitor closely and follow q6h cbc, transfuse as necessary. - LaB: INR, PTT  Recurrent seizure: Most likely due to medication noncompliance. She has subtherapeutic phenytoin level on admission. -Loaded phenytoin on admission -Continue phenytoin -Seizure precaution  Asthma with bronchitis: stable -prn albuterol nebulizer  HTN: -Continue atenolol  Protein-calorie malnutrition, severe ([place]): -Nutrition consult  Alcohol abuse: -did counseling about importance of quitting alcohol. -CIWA protocol  Thrombocytopenia: Platelet of 105, likely due to echo abuse. -Follow-up the CBC   DVT ppx: SCD Code Status: Full code Family Communication:  Yes, patient's fianc at bed side Disposition Plan:  Anticipate discharge back to previous home environment Consults called: GI, Dr. Loletha Carrow, but dr. Leslee Home will see pt in AM per EDP Admission status:   obs / SDU   Date of Service 05/30/2015    Ivor Costa [facility] Pager [phone]  If 7PM-7AM, please contact night-coverage [email or web address] Password [facility] 05/30/2015, 7:38 AM

## 2015-05-30 NOTE — ED Provider Notes (Signed)
By signing my name below, I, Altamease Oiler, attest that this documentation has been prepared under the direction and in the presence of Rolland Porter, MD. Electronically Signed: Altamease Oiler, ED Scribe. 05/30/2015. 1:32 AM  Pt complains of several episodes of dark tarry stools x 5-6  and back pain that started this morning. She denies abdominal pain, nausea, and vomiting. On exam pt is alert and cooperative with mumbling speech. No abdominal tenderness to palpation. Conjunctiva minimally pale.    Medical screening examination/treatment/procedure(s) were conducted as a shared visit with non-physician practitioner(s) and myself.  I personally evaluated the patient during the encounter.   Rolland Porter, MD, FACEP   I personally performed the services described in this documentation, which was scribed in my presence. The recorded information has been reviewed and considered.  Rolland Porter, MD, Barbette Or, MD 05/30/15 [phone]

## 2015-05-30 NOTE — ED Notes (Signed)
Pt taken to restroom, had small amount of blood on tissue but no stool. Vomit x1 clear contents on way to bathroom.

## 2015-05-30 NOTE — Progress Notes (Signed)
Peripherally Inserted Central Catheter/Midline Placement  The IV Nurse has discussed with the patient and/or persons authorized to consent for the patient, the purpose of this procedure and the potential benefits and risks involved with this procedure.  The benefits include less needle sticks, lab draws from the catheter and patient may be discharged home with the catheter.  Risks include, but not limited to, infection, bleeding, blood clot (thrombus formation), and puncture of an artery; nerve damage and irregular heat beat.  Alternatives to this procedure were also discussed.  PICC/Midline Placement Documentation        Brandy Bishop, [name] 05/30/2015, 10:28 AM

## 2015-05-31 ENCOUNTER — Encounter (HOSPITAL_COMMUNITY): Payer: Self-pay | Admitting: Certified Registered Nurse Anesthetist

## 2015-05-31 ENCOUNTER — Inpatient Hospital Stay (HOSPITAL_COMMUNITY): Payer: Medicaid Other | Admitting: Anesthesiology

## 2015-05-31 ENCOUNTER — Encounter (HOSPITAL_COMMUNITY)
Admission: EM | Disposition: A | Discharge status: Home / Self Care | Payer: Self-pay | Source: Home / Self Care | Attending: Internal Medicine

## 2015-05-31 DIAGNOSIS — K2100 Gastro-esophageal reflux disease with esophagitis, without bleeding: Secondary | ICD-10-CM | POA: Insufficient documentation

## 2015-05-31 DIAGNOSIS — D696 Thrombocytopenia, unspecified: Secondary | ICD-10-CM

## 2015-05-31 DIAGNOSIS — R569 Unspecified convulsions: Secondary | ICD-10-CM

## 2015-05-31 HISTORY — PX: ESOPHAGOGASTRODUODENOSCOPY: SHX5428

## 2015-05-31 LAB — BASIC METABOLIC PANEL
ANION GAP: 9 (ref 5–15)
BUN: <5 mg/dL — ABNORMAL LOW (ref 6–20)
CHLORIDE: 108 mmol/L (ref 101–111)
CO2: 22 mmol/L (ref 22–32)
Calcium: 8.2 mg/dL — ABNORMAL LOW (ref 8.9–10.3)
Creatinine, Ser: 0.54 mg/dL (ref 0.44–1.00)
GFR calc non Af Amer: >60 mL/min (ref >60)
GLUCOSE: 119 mg/dL — AB (ref 65–99)
Potassium: 4 mmol/L (ref 3.5–5.1)
Sodium: 139 mmol/L (ref 135–145)

## 2015-05-31 LAB — CBC
HCT: 21.9 % — ABNORMAL LOW (ref 36.0–46.0)
HEMATOCRIT: 22.2 % — AB (ref 36.0–46.0)
HEMOGLOBIN: 7.2 g/dL — AB (ref 12.0–15.0)
Hemoglobin: 7.3 g/dL — ABNORMAL LOW (ref 12.0–15.0)
MCH: 31.6 pg (ref 26.0–34.0)
MCH: 32.6 pg (ref 26.0–34.0)
MCHC: 32.9 g/dL (ref 30.0–36.0)
MCHC: 32.9 g/dL (ref 30.0–36.0)
MCV: 96.1 fL (ref 78.0–100.0)
MCV: 99.1 fL (ref 78.0–100.0)
PLATELETS: 112 10*3/uL — AB (ref 150–400)
Platelets: 117 10*3/uL — ABNORMAL LOW (ref 150–400)
RBC: 2.31 MIL/uL — AB (ref 3.87–5.11)
RBC: 2.21 MIL/uL — AB (ref 3.87–5.11)
RDW: 14.6 % (ref 11.5–15.5)
RDW: 14.3 % (ref 11.5–15.5)
WBC: 4.2 10*3/uL (ref 4.0–10.5)
WBC: 5.3 10*3/uL (ref 4.0–10.5)

## 2015-05-31 LAB — GLUCOSE, CAPILLARY: Glucose-Capillary: 119 mg/dL — ABNORMAL HIGH (ref 65–99)

## 2015-05-31 SURGERY — EGD (ESOPHAGOGASTRODUODENOSCOPY)
Anesthesia: Monitor Anesthesia Care

## 2015-05-31 MED ORDER — SODIUM CHLORIDE 0.9 % IV SOLN: INTRAVENOUS | Status: DC

## 2015-05-31 MED ORDER — PROPOFOL 10 MG/ML IV BOLUS
INTRAVENOUS | Status: DC | PRN
  Administered 2015-05-31: 15 mg via INTRAVENOUS
  Administered 2015-05-31 (×2): 20 mg via INTRAVENOUS
  Administered 2015-05-31: 30 mg via INTRAVENOUS

## 2015-05-31 MED ORDER — PROPOFOL 500 MG/50ML IV EMUL
INTRAVENOUS | Status: DC | PRN
  Administered 2015-05-31: 60 ug/kg/min via INTRAVENOUS

## 2015-05-31 MED ORDER — PANTOPRAZOLE SODIUM 40 MG IV SOLR
40.0000 mg | INTRAVENOUS | Status: DC
  Administered 2015-05-31: 40 mg via INTRAVENOUS
  Filled 2015-05-31: qty 40

## 2015-05-31 MED ORDER — SODIUM CHLORIDE 0.9 % IV SOLN
INTRAVENOUS | Status: DC
  Administered 2015-05-31: 15:00:00 via INTRAVENOUS

## 2015-05-31 NOTE — Progress Notes (Addendum)
Patient ID: Brandy Bishop, female   DOB: 28-Mar-1960, 55 y.o.   MRN: [id number]  PROGRESS NOTE    Brandy Bishop  [id number] DOB: Mar 10, 1960 DOA: 05/29/2015  PCP: No PCP Per Patient will set up with community health wellness on discharge  Outpatient Specialists: None  Brief Narrative:  55 y.o. female with medical history significant of medication noncompliance, seizure, hypertension, asthma, GI bleeding, pancreatitis, colitis, alcohol abuse, chronic back pain who presented to [facility] ED with seizures as well as black tarry stool. In regards to seizures, patient's boyfriend witnessed 2 episodes of seizures each lasting about 10 minutes, tonic-clonic movement and then confusion after the seizure. Patient reports not taking phenytoin consistently. On the morning of this admission she had 7 episodes of tarry stool but no associated diarrhea.   In ED, patient was hemodynamically stable. Her phenytoin level was subtherapeutic at 5.8, alcohol level was less than 5. Hemoglobin was 10.8 with further drop down to 7.5. Chest x-ray showed no acute cardiopulmonary findings. GI has seen the patient in consultation.  Assessment & Plan:   Principal Problem: Seizure disorder ([place]) - Secondary to noncompliance with medications. Patient can possibly have alcohol withdrawal seizures as well - No seizures in past 24 hours - Patient did receive loading dose of phenytoin on the admission and currently is on phenytoin 300 mg at bedtime - We will continue CIWA protocol - Monitor on telemetry  Active Problems: Acute upper GI bleed / Acute blood loss anemia / Anemia of chronic disease  - Bleeding likely secondary to bone marrow suppression from history of alcohol use - Patient is on octreotide drip and Protonix drip - She reports no blood per rectum - GI plans endoscopy today  Thrombocytopenia  - Secondary to bone marrow suppression from alcohol abuse - Platelet count stable, 112  Alcohol  abuse - Continue CIWA protocol - Continue IV fluids, multivitamin, thiamine and folic acid  - No reports of withdrawals  Underweight  - Body mass index is 18.2 kg/(m^2). - Seen by nutrition  - Diet after EGD per GI  DVT prophylaxis: SCDs bilaterally due to risk of bleeding Code Status: full code  Family Communication: Family not at the bedside Disposition Plan: Home once stable from GI standpoint. Anticipate discharge by 06/01/2015 if hemoglobin stable    Consultants:   GI, Dr. [name]   Procedures:   Plan for EGD 05/31/2015    Antimicrobials:   None    Subjective: No tremors. No agitation or restlessness.   Objective: Filed Vitals:   05/30/15 1700 05/30/15 1800 05/31/15 0009 05/31/15 0520  BP: 161/99 168/99 114/72 141/81  Pulse: 72 73 77 88  Temp:   98.4 F (36.9 C) 98.4 F (36.9 C)  TempSrc:   Oral Oral  Resp: 13 13 20 18   Height:      Weight:      SpO2: 100% 100% 99% 100%    Intake/Output Summary (Last 24 hours) at 05/31/15 0648 Last data filed at 05/31/15 0640  Gross per 24 hour  Intake 4172.09 ml  Output      0 ml  Net 4172.09 ml   Filed Weights   05/30/15 0800  Weight: 46.6 kg (102 lb 11.8 oz)    Examination:  General exam: Appears in no acute distress  Respiratory system: Respiratory effort normal. No wheezing or rhonchi  Cardiovascular system: S1 & S2 (+), Rate controlled. No JVD Gastrointestinal system: Non tender, non distended, (+) BS Central nervous system: No focal  neurological deficits. Awake. Extremities: Strength 5/5, no cyanosis, palpable pulses  Skin: No rashes, lesions or ulcers, skin is warm and dry  Psychiatry: Mood normal, no agitation, no tremors    Data Reviewed: I have personally reviewed following labs and imaging studies  CBC:  Recent Labs Lab 05/29/15 2352 05/30/15 0241 05/30/15 0954  WBC 6.0 4.9 5.2  NEUTROABS 4.0  --   --   HGB 8.5* 7.5* 7.8*  HCT 24.8* 22.6* 23.7*  MCV 95.4 94.6 95.2  PLT 105* 98*  99991111*   Basic Metabolic Panel:  Recent Labs Lab 05/29/15 2352 05/30/15 0241  NA 137 136  K 4.3 4.0  CL 102 100*  CO2 22 22  GLUCOSE 118* 136*  BUN 11 9  CREATININE 0.60 0.65  CALCIUM 8.7* 8.3*   GFR: Estimated Creatinine Clearance: 59.1 mL/min (by C-G formula based on Cr of 0.65). Liver Function Tests:  Recent Labs Lab 05/29/15 2352 05/30/15 0241  AST 52* 51*  ALT 25 23  ALKPHOS 62 55  BILITOT 0.7 1.0  PROT 6.7 6.1*  ALBUMIN 3.6 3.4*   No results for input(s): LIPASE, AMYLASE in the last 168 hours. No results for input(s): AMMONIA in the last 168 hours. Coagulation Profile:  Recent Labs Lab 05/30/15 0022  INR 1.04   Cardiac Enzymes: No results for input(s): CKTOTAL, CKMB, CKMBINDEX, TROPONINI in the last 168 hours. BNP (last 3 results) No results for input(s): PROBNP in the last 8760 hours. HbA1C: No results for input(s): HGBA1C in the last 72 hours. CBG:  Recent Labs Lab 05/30/15 0906  GLUCAP 103*   Lipid Profile: No results for input(s): CHOL, HDL, LDLCALC, TRIG, CHOLHDL, LDLDIRECT in the last 72 hours. Thyroid Function Tests: No results for input(s): TSH, T4TOTAL, FREET4, T3FREE, THYROIDAB in the last 72 hours. Anemia Panel: No results for input(s): VITAMINB12, FOLATE, FERRITIN, TIBC, IRON, RETICCTPCT in the last 72 hours. Urine analysis:    Component Value Date/Time   COLORURINE YELLOW 05/29/2015 2331   APPEARANCEUR CLEAR 05/29/2015 2331   LABSPEC 1.012 05/29/2015 2331   PHURINE 6.0 05/29/2015 2331   GLUCOSEU NEGATIVE 05/29/2015 2331   HGBUR MODERATE* 05/29/2015 2331   BILIRUBINUR NEGATIVE 05/29/2015 2331   KETONESUR NEGATIVE 05/29/2015 2331   PROTEINUR NEGATIVE 05/29/2015 2331   UROBILINOGEN 0.2 04/14/2014 1419   NITRITE NEGATIVE 05/29/2015 2331   LEUKOCYTESUR NEGATIVE 05/29/2015 2331   Sepsis Labs: @LABRCNTIP (procalcitonin:4,lacticidven:4)  Recent Results (from the past 240 hour(s))  MRSA PCR Screening     Status: None   Collection  Time: 05/30/15  8:35 AM  Result Value Ref Range Status   MRSA by PCR NEGATIVE NEGATIVE Final    Comment:        The GeneXpert MRSA Assay (FDA approved for NASAL specimens only), is one component of a comprehensive MRSA colonization surveillance program. It is not intended to diagnose MRSA infection nor to guide or monitor treatment for MRSA infections.       Radiology Studies: Ct Head Wo Contrast 05/30/2015   1. No acute intracranial pathology seen on CT. 2. Mild to moderate cortical volume loss and scattered small vessel ischemic microangiopathy. 3. 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 E  Dg Chest Port 1 View 05/30/2015  No acute cardiopulmonary process seen.     Scheduled Meds: . atenolol  25 mg Oral Daily  . dextromethorphan-guaiFENesin  1 tablet Oral BID  . fluticasone  2 spray Each Nare Daily  . folic acid  1 mg Oral  Daily  . lipase/protease/amylase  1 capsule Oral TID AC  . LORazepam  0-4 mg Intravenous Q6H   Followed by  . [START ON 06/01/2015] LORazepam  0-4 mg Intravenous Q12H  . multivitamin with minerals  1 tablet Oral Daily  . octreotide  50 mcg Intravenous Once  . phenytoin  300 mg Oral QHS  . sodium chloride flush  10-40 mL Intracatheter Q12H  . sodium chloride flush  3 mL Intravenous Q12H  . thiamine  100 mg Oral Daily   Or  . thiamine  100 mg Intravenous Daily   Continuous Infusions: . sodium chloride 125 mL/hr at 05/31/15 0640  . octreotide  (SANDOSTATIN)    IV infusion 50 mcg/hr (05/30/15 2229)  . pantoprozole (PROTONIX) infusion 8 mg/hr (05/30/15 2229)     LOS: 1 day  Time spent: 25 minutes    Leisa Lenz, MD [facility] Pager [phone]  If 7PM-7AM, please contact night-coverage [email or web address] Password [facility] 05/31/2015, 6:48 AM

## 2015-05-31 NOTE — Transfer of Care (Signed)
Immediate Anesthesia Transfer of Care Note  Patient: Brandy Bishop  Procedure(s) Performed: Procedure(s): ESOPHAGOGASTRODUODENOSCOPY (EGD) (N/A)  Patient Location: Endoscopy Unit  Anesthesia Type:MAC  Level of Consciousness: awake, alert  and patient cooperative  Airway & Oxygen Therapy: Patient Spontanous Breathing and Patient connected to nasal cannula oxygen  Post-op Assessment: Report given to RN and Post -op Vital signs reviewed and stable  Post vital signs: Reviewed and stable  Last Vitals:  Filed Vitals:   05/31/15 0943 05/31/15 1051  BP: 171/97 175/97  Pulse: 90 87  Temp:    Resp: 14 16    Last Pain:  Filed Vitals:   05/31/15 1051  PainSc: 8          Complications: No apparent anesthesia complications

## 2015-05-31 NOTE — Anesthesia Procedure Notes (Signed)
Procedure Name: MAC Date/Time: 05/31/2015 9:28 AM Performed by: Salli Quarry Veanna Dower Pre-anesthesia Checklist: Emergency Drugs available, Patient identified, Suction available, Patient being monitored and Timeout performed Patient Re-evaluated:Patient Re-evaluated prior to inductionOxygen Delivery Method: Nasal cannula

## 2015-05-31 NOTE — H&P (View-Only) (Signed)
[facility] Consult: 8:25 AM 05/30/2015     Referring Provider: Dr Charlies Silvers  Primary Care Physician: [place].  Primary Gastroenterologist:  Althia Forts.  Never followed up with GI after 04/2014 admission.     Reason for Consultation:  Melena, amenia.     HPI: Brandy Bishop is a 55 y.o. female. Alcoholic and hx polysubstance abuse.  Alcoholic hepatitis dating to at least 1999. Seizure disorder, asthma. Fatty liver on CT of 2011. Hepatitis panel for ABC negative in 04/2014. Chronic pancreatitis changes seen on CT scans dating back to 2003. Hx pyelonephritis.  Thrombocytopenia. Anemia.  Protein calorie malnutrition.  Chronic back pain.  Pan colitis on CT in 04/2014. Had bloody stool, abdominal pain.  Sxs resolved and she discharged 04/16/14.  No showed for 05/02/15 GI office follow up.    Seen in ED 10/2014 and weight had dropped from 124# (05/2012 - 04/2014) to 98#.  Now 102#.  Seen in ED with 2 months of abdominal pain.  She was intoxicated, tox + for cocaine.  Upper abdominal lipoma on exam. Lipase normal.   02/25/15 CT showed.   Diffuse colonic diverticulosis. Mildly increased attenuation in the pericolonic fat near the splenic flexure, in an area that is not particularly demonstrative of diverticulosis. This finding is nonspecific and could reflect mild diverticulitis or nonspecific colitis. No complicating features identified to suggest abscess or perforation. Although the colon did appear diffusely inflamed on the prior study, this area of mesenteric attenuation was not present at that time. Additionally, the colon does not currently demonstrate features of diffuse inflammation as it did previously. There is again evidence of chronic pancreatitis.  Came to ED after having at least 2 seizures of up  to 10 minutes duration.  Tarry stools began 4/23, 3 in ED but none since 0200 today.  No seizure since arrival.  BPs 120s-130s/50s-80s, pulses 70s - 90s at arrival.  Currently 140s to 160s, 70s to low 100s.   Hgb 12.6 10/2014, 10.8 on 02/25/2015, 8.5 on 05/29/15 and 7.5 today.   Platelets 98.  Coags WNL.  BUN not elevated.   AST/ALT 52/25.  Normal T bili, alk phos.  Lactic acid 2.18. ETOH <5.   Bilious emesis in ED.  No abd pain.  Appetite generally good.  Stools previously formed, daily, brown.  No dysphagia.  No nose bleeds.  Uses Aleve infrequently for headaches, last use ~ 2 weeks ago.  Drinks 40 to 80 oz malt liquor daily.  Last consumed last 4/24 in AM.    Past Medical History  Diagnosis Date  . Asthma   . Hypertension   . Pancreatitis     Chronic pancreatitis noted on CT scan in 04/2014.  . Colitis 04/2014.    Bloody diarrhea  . Alcoholism ([place]) 2011  . Polysubstance abuse 2011  . Pyelonephritis 05/2012  . GIB (gastrointestinal bleeding)   . Daily headache   . Seizures ([place])     "don't know what they are from" (04/14/2014)  . Chronic lower back pain     Past Surgical History  Procedure Laterality Date  . No past surgeries      Prior to Admission medications   Medication Sig Start Date End Date Taking? Authorizing Provider  atenolol (TENORMIN) 25 MG tablet Take 2 tablets (50 mg total) by mouth daily. Patient taking differently: Take 25 mg by mouth daily.  10/15/14  Yes Hope Bunnie Pion, NP  lipase/protease/amylase (CREON-10/PANCREASE) 12000 UNITS CPEP Take 1 capsule by mouth 3 (three) times daily before meals. 05/11/12  Yes Belkys A Regalado, MD  phenytoin (DILANTIN) 100 MG ER capsule Take 3 capsules (300 mg total) by mouth at bedtime. 06/26/12  Yes John Molpus, MD  albuterol (PROVENTIL HFA;VENTOLIN HFA) 108 (90 BASE) MCG/ACT inhaler Inhale 2 puffs into the lungs once. Patient not taking: Reported on 04/14/2014 07/06/12   Marin Olp, MD  azithromycin (ZITHROMAX) 250 MG tablet Take  1 tablet (250 mg total) by mouth daily. Take first 2 tablets together, then 1 every day until finished. 02/25/15   Okey Regal, PA-C  feeding supplement, RESOURCE BREEZE, (RESOURCE BREEZE) LIQD Take 1 Container by mouth 3 (three) times daily between meals. Patient not taking: Reported on 02/25/2015 04/16/14   Lucious Groves, DO  folic acid (FOLVITE) 1 MG tablet Take 1 tablet (1 mg total) by mouth daily. Patient not taking: Reported on 02/25/2015 04/16/14   Lucious Groves, DO  Multiple Vitamin (MULTIVITAMIN WITH MINERALS) TABS Take 1 tablet by mouth daily. Patient not taking: Reported on 04/14/2014 05/11/12   Belkys A Regalado, MD  thiamine 100 MG tablet Take 1 tablet (100 mg total) by mouth daily. Patient not taking: Reported on 02/25/2015 04/16/14   Lucious Groves, DO    Scheduled Meds: . atenolol  25 mg Oral Daily  . dextromethorphan-guaiFENesin  1 tablet Oral BID  . fluticasone  2 spray Each Nare Daily  . folic acid  1 mg Oral Daily  . lipase/protease/amylase  1 capsule Oral TID AC  . LORazepam  0-4 mg Intravenous Q6H   Followed by  . [START ON 06/01/2015] LORazepam  0-4 mg Intravenous Q12H  . multivitamin with minerals  1 tablet Oral Daily  . octreotide  50 mcg Intravenous Once  . phenytoin (DILANTIN) IV  1,000 mg Intravenous Once  . phenytoin  300 mg Oral QHS  . sodium chloride flush  3 mL Intravenous Q12H  . thiamine  100 mg Oral Daily   Or  . thiamine  100 mg Intravenous Daily   Infusions: . sodium chloride    . octreotide  (SANDOSTATIN)    IV infusion    . pantoprozole (PROTONIX) infusion 8 mg/hr (05/30/15 0435)   PRN Meds: acetaminophen **OR** acetaminophen, albuterol, LORazepam **OR** LORazepam, ondansetron, oxyCODONE-acetaminophen   Allergies as of 05/29/2015 - Review Complete 05/29/2015  Allergen Reaction Noted  . Penicillins Swelling 03/15/2011    Family History  Problem Relation Age of Onset  . Cancer Mother   . Cancer Brother     Social History   Social  History  . Marital Status: Single    Spouse Name: N/A  . Number of Children: N/A  . Years of Education: N/A   Occupational History  . Not on file.   Social History Main Topics  . Smoking status: Never Smoker   . Smokeless tobacco: Never Used  . Alcohol Use: 6.0 oz/week    10 Cans of beer per week     Comment: 04/14/2014 "I drink all of them; I'm not drinking anything anymore"  . Drug Use: No  .  Sexual Activity: No   Other Topics Concern  . Not on file   Social History Narrative   Lives in [place].    Disabled due to seizures.    Stay with a friend.    Single. No kids.     REVIEW OF SYSTEMS: Constitutional:  Generally not fatigued.   ENT:  No nose bleeds Pulm:  No COB or cough CV:  No palpitations, no LE edema.  GU:  No hematuria, no frequency GI:  Per HPI Heme:  No unusual bleeding or bruising   Transfusions:  none Neuro: occasional headaches, no peripheral tingling or numbness Derm:  No itching, no rash or sores.  Endocrine:  No sweats or chills.  No polyuria or dysuria Immunization:  No queried.  Travel:  None beyond local counties in last few months.    PHYSICAL EXAM: Vital signs in last 24 hours: Filed Vitals:   05/30/15 0645 05/30/15 0700  BP: 161/92 163/98  Pulse: 101 101  Temp:    Resp: 14 18   Wt Readings from Last 3 Encounters:  05/30/15 46.6 kg (102 lb 11.8 oz)  10/15/14 44.566 kg (98 lb 4 oz)  04/15/14 57.698 kg (127 lb 3.2 oz)    General: thin, chronically ill looking.  Comfortable and alert Head:  No swelling or asymmetry.  No trauma  Eyes:  No icterus or pallor Ears:  Not HOH  Nose:  No discharge or congestion Mouth:  Clear and moist.   Neck:  No mass, no JVD or TMG Lungs:  Clear bil.  No cough or dyspnea Heart: RRR.  No mrg.  S1/s2 present Abdomen:  Soft, NT, ND.  Active BS.  Not protuberant.  Active BS.  Quarter-sized sub Q mass just distal to xyphoid.   Rectal: deferred   Musc/Skeltl: no contractures or swelling Extremities:  No CCE    Neurologic:  Oriented x 3.  cooperative Skin:  No rash, or sores Psych:  Cooperative and calm.    Intake/Output from previous day:   Intake/Output this shift:    LAB RESULTS:  Recent Labs  05/29/15 2352 05/30/15 0241  WBC 6.0 4.9  HGB 8.5* 7.5*  HCT 24.8* 22.6*  PLT 105* 98*   BMET Lab Results  Component Value Date   NA 136 05/30/2015   NA 137 05/29/2015   NA 140 02/25/2015   K 4.0 05/30/2015   K 4.3 05/29/2015   K 3.8 02/25/2015   CL 100* 05/30/2015   CL 102 05/29/2015   CL 98* 02/25/2015   CO2 22 05/30/2015   CO2 22 05/29/2015   CO2 27 02/25/2015   GLUCOSE 136* 05/30/2015   GLUCOSE 118* 05/29/2015   GLUCOSE 81 02/25/2015   BUN 9 05/30/2015   BUN 11 05/29/2015   BUN 10 02/25/2015   CREATININE 0.65 05/30/2015   CREATININE 0.60 05/29/2015   CREATININE 0.62 02/25/2015   CALCIUM 8.3* 05/30/2015   CALCIUM 8.7* 05/29/2015   CALCIUM 8.7* 02/25/2015   LFT  Recent Labs  05/29/15 2352 05/30/15 0241  PROT 6.7 6.1*  ALBUMIN 3.6 3.4*  AST 52* 51*  ALT 25 23  ALKPHOS 62 55  BILITOT 0.7 1.0  BILIDIR 0.1  --   IBILI 0.6  --    PT/INR Lab Results  Component Value Date   INR 1.04 05/30/2015   INR 1.09 04/14/2014   INR 0.91 11/29/2009    Drugs of Abuse     Component Value Date/Time   LABOPIA NONE DETECTED 02/25/2015 1813  LABOPIA NEG 05/31/2009 2246   COCAINSCRNUR POSITIVE* 02/25/2015 1813   COCAINSCRNUR POS* 05/31/2009 2246   LABBENZ NONE DETECTED 02/25/2015 1813   LABBENZ NEG 05/31/2009 2246   AMPHETMU NONE DETECTED 02/25/2015 1813   AMPHETMU NEG 05/31/2009 2246   THCU NONE DETECTED 02/25/2015 1813   LABBARB NONE DETECTED 02/25/2015 1813     RADIOLOGY STUDIES: Ct Head Wo Contrast  05/30/2015  CLINICAL DATA:  Acute onset of seizure.  Initial encounter. EXAM: CT HEAD WITHOUT CONTRAST TECHNIQUE: Contiguous axial images were obtained from the base of the skull through the vertex without intravenous contrast. COMPARISON:  CT of the head performed  10/24/2012 FINDINGS: There is no evidence of acute infarction, mass lesion, or intra- or extra-axial hemorrhage on CT. Prominence of the ventricles and sulci reflects mild to moderate cortical volume loss. 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 Mild periventricular and subcortical white matter change likely reflects small vessel ischemic microangiopathy. The brainstem and fourth ventricle are within normal limits. The basal ganglia are unremarkable in appearance. No mass effect or midline shift is seen. There is no evidence of fracture; visualized osseous structures are unremarkable in appearance. The visualized portions of the orbits are within normal limits. The paranasal sinuses and mastoid air cells are well-aerated. No significant soft tissue abnormalities are seen. IMPRESSION: 1. No acute intracranial pathology seen on CT. 2. Mild to moderate cortical volume loss and scattered small vessel ischemic microangiopathy. 3. 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 Electronically Signed   By: Garald Balding M.D.   On: 05/30/2015 00:26   Dg Chest Port 1 View  05/30/2015  CLINICAL DATA:  Status post seizure.  Cough.  Initial encounter. EXAM: PORTABLE CHEST 1 VIEW COMPARISON:  Chest radiograph performed 02/25/2015 FINDINGS: The lungs are well-aerated and clear. There is no evidence of focal opacification, pleural effusion or pneumothorax. The cardiomediastinal silhouette is within normal limits. No acute osseous abnormalities are seen. IMPRESSION: No acute cardiopulmonary process seen. Electronically Signed   By: Garald Balding M.D.   On: 05/30/2015 02:35    ENDOSCOPIC STUDIES: none  IMPRESSION:   *  Tarry stools, c/w UGIB in alcoholic with known fatty liver and ongoing ETOH abuse.   Rule out ulcer vs portal gastropathy vs varices vs neoplasia.    *  Blood loss anemia  *  Thrombocytopenia.  Fortunately PT/INR  are WNL.    *  Seizure disorder, active seizures PTA.  Head Ct with small vessel dz, chronic left parietal infarct, enciphalmalacia.   *  Chronic alcoholism. Mild elevation AST.    *  Bloody colitis 04/2014.  Never followed up with GI.  Follow up CT 02/2015 showed resolution of colitis.   *  IV access problems.  Only 1 periph IV at present with Protonix drip infusing.  Octreotide gtt ordered but no access for this  *  Weight loss as of 10/2014.  Current weight is up 4# from then    PLAN:     *  EGD, with MAC, set for 10:30 tomorrow (no MAC available for today and pt is stable)    *  Challenging IV access,  PICC line placed.   *  ok to have clears, npo after midnight.   *  Needs Repeat CBC, was due at 0800 today but not yet collected.    Azucena Freed  05/30/2015, 8:25 AM Pager: [phone]  GI ATTENDING  History,laboratories, x-rays reviewed. Patient seen  and examined. Agree with comprehensive consultation note as outlined above. Complicated patient with multiple problems related and unrelated to polysubstance abuse and severe alcoholism. Presents with seizures. Reports several days of tarry stools.Drifting hemoglobin.Patient is hemodynamically stable with normal BUN. Did take some NSAIDs. Agree with obtaining good IV access, IV PPI, and IV octreotide empirically. No known varices but does have alcoholic liver disease.Plans for EGD in a.m. With MAC.She is high-risk.The nature of the procedure, as well as the risks, benefits, and alternatives were carefully and thoroughly reviewed with the patient. Ample time for discussion and questions allowed. The patient understood, was satisfied, and agreed to proceed.  Docia Chuck. Geri Seminole., M.D. [facility] Division of Gastroenterology

## 2015-05-31 NOTE — Progress Notes (Signed)
Pt. Received all 10 am medications at this time. Spoke with Pharmacy about MAR. Unable to scan medications due to [facility] change during patient's upper Endoscopy. Called Endo twice today earlier in the shift. Staff notified MD in Endo to fix [facility] MAR still not fixed at this time. Will continue to monitor.

## 2015-05-31 NOTE — Anesthesia Preprocedure Evaluation (Addendum)
Anesthesia Evaluation  Patient identified by MRN, date of birth, ID band Patient awake    Reviewed: Allergy & Precautions, H&P , NPO status , Patient's Chart, lab work & pertinent test results  History of Anesthesia Complications Negative for: history of anesthetic complications  Airway Mallampati: II  TM Distance: >3 FB Neck ROM: full    Dental no notable dental hx.    Pulmonary asthma ,    Pulmonary exam normal breath sounds clear to auscultation       Cardiovascular hypertension, Pt. on medications Normal cardiovascular exam Rhythm:regular Rate:Normal     Neuro/Psych Seizures -, Poorly Controlled,  PSYCHIATRIC DISORDERS    GI/Hepatic negative GI ROS, (+)     substance abuse  alcohol use, Hepatitis -Alcoholic and fatty liver disease   Endo/Other  negative endocrine ROS  Renal/GU negative Renal ROS     Musculoskeletal   Abdominal   Peds  Hematology  (+) anemia ,   Anesthesia Other Findings Medical noncompliance with seizure medication  Reproductive/Obstetrics negative OB ROS                            Anesthesia Physical Anesthesia Plan  ASA: IV  Anesthesia Plan: MAC   Post-op Pain Management:    Induction: Intravenous  Airway Management Planned:   Additional Equipment:   Intra-op Plan:   Post-operative Plan:   Informed Consent: I have reviewed the patients History and Physical, chart, labs and discussed the procedure including the risks, benefits and alternatives for the proposed anesthesia with the patient or authorized representative who has indicated his/her understanding and acceptance.   Dental Advisory Given  Plan Discussed with: Anesthesiologist, CRNA and Surgeon  Anesthesia Plan Comments:        Anesthesia Quick Evaluation

## 2015-05-31 NOTE — Op Note (Signed)
[facility] Patient Name: Brandy Bishop Procedure Date : 05/31/2015 MRN: [id number] Attending MD: Docia Chuck. Henrene Pastor , MD Date of Birth: [birth year] CSN: [id number] Age: 55 Admit Type: Inpatient Procedure:                Upper GI endoscopy Indications:              Melena Providers:                Docia Chuck. Henrene Pastor, MD, Carolynn Comment, RN, Despina Pole, Technician, Lavona Mound, CRNA Referring MD:             [facility] Medicines:                Monitored Anesthesia Care Complications:            No immediate complications. Estimated Blood Loss:     Estimated blood loss: none. Procedure:                Pre-Anesthesia Assessment:                           - Prior to the procedure, a History and Physical                            was performed, and patient medications and                            allergies were reviewed. The patient's tolerance of                            previous anesthesia was also reviewed. The risks                            and benefits of the procedure and the sedation                            options and risks were discussed with the patient.                            All questions were answered, and informed consent                            was obtained. Prior Anticoagulants: The patient has                            taken no previous anticoagulant or antiplatelet                            agents. ASA Grade Assessment: III - A patient with                            severe systemic disease. After reviewing the risks  and benefits, the patient was deemed in                            satisfactory condition to undergo the procedure.                           After obtaining informed consent, the endoscope was                            passed under direct vision. Throughout the                            procedure, the patient's blood pressure, pulse, and   oxygen saturations were monitored continuously. The                            [id number]) scope was introduced through the                            mouth, and advanced to the third part of duodenum.                            The upper GI endoscopy was accomplished without                            difficulty. The patient tolerated the procedure                            well. Scope In: Scope Out: Findings:      LA Grade C (one or more mucosal breaks continuous between tops of 2 or       more mucosal folds, less than 75% circumference) esophagitis with no       bleeding was found.      The exam of the esophagus was otherwise normal.      The entire examined stomach was normal.      The examined duodenum was normal.      The cardia and gastric fundus were normal on retroflexion. Impression:               - LA Grade C reflux esophagitis. This may explain                            dark stools. No varices                           - Normal stomach. No varices                           - Normal examined duodenum.                           - No specimens collected. Moderate Sedation:      none Recommendation:           - 1. Recommend pantoprazole 40 mgdaily  2. Advance diet as tolerated                           3. Patient will need colonoscopy. However she is                            deemed too weak at this time for the procedure.If                            her strength level improves this could be done                            prior to discharge or as an outpatient.                           4. Stop drinking alcohol and using drugs. Needs                            nourishment.Needs physical rehabilitation.Needs                            outpatient support network both medical and social.                            Primary service addressing                           GI will sign off at this time but are available if                             needed. Thank you Procedure Code(s):        --- Professional ---                           [phone], Esophagogastroduodenoscopy, flexible,                            transoral; diagnostic, including collection of                            specimen(s) by brushing or washing, when performed                            (separate procedure) Diagnosis Code(s):        --- Professional ---                           K21.0, Gastro-esophageal reflux disease with                            esophagitis                           K92.1, Melena (includes Hematochezia) CPT copyright 2016 American Medical Association. All rights reserved. The codes documented in this report are preliminary and upon coder review may  be revised to meet current compliance requirements. Docia Chuck. Henrene Pastor, MD 05/31/2015 10:59:39 AM This report has been signed electronically. Number of Addenda: 0

## 2015-05-31 NOTE — Interval H&P Note (Signed)
History and Physical Interval Note:  05/31/2015 10:10 AM  Brandy Bishop  has presented today for surgery, with the diagnosis of melena, anemia.  alcoholic, hx fatty liver.  The various methods of treatment have been discussed with the patient and family. After consideration of risks, benefits and other options for treatment, the patient has consented to  Procedure(s): ESOPHAGOGASTRODUODENOSCOPY (EGD) (N/A) as a surgical intervention .  The patient's history has been reviewed, patient examined, no change in status, stable for surgery.  I have reviewed the patient's chart and labs.  Questions were answered to the patient's satisfaction.     [name]

## 2015-06-01 DIAGNOSIS — E43 Unspecified severe protein-calorie malnutrition: Secondary | ICD-10-CM

## 2015-06-01 DIAGNOSIS — G40909 Epilepsy, unspecified, not intractable, without status epilepticus: Secondary | ICD-10-CM

## 2015-06-01 LAB — GLUCOSE, CAPILLARY
GLUCOSE-CAPILLARY: 95 mg/dL (ref 65–99)
Glucose-Capillary: 103 mg/dL — ABNORMAL HIGH (ref 65–99)

## 2015-06-01 LAB — CBC
HCT: 22.2 % — ABNORMAL LOW (ref 36.0–46.0)
HEMOGLOBIN: 7.3 g/dL — AB (ref 12.0–15.0)
MCH: 31.7 pg (ref 26.0–34.0)
MCHC: 32.9 g/dL (ref 30.0–36.0)
MCV: 96.5 fL (ref 78.0–100.0)
PLATELETS: 119 10*3/uL — AB (ref 150–400)
RBC: 2.3 MIL/uL — AB (ref 3.87–5.11)
RDW: 14.3 % (ref 11.5–15.5)
WBC: 7.8 10*3/uL (ref 4.0–10.5)

## 2015-06-01 MED ORDER — PANCRELIPASE (LIP-PROT-AMYL) 12000-38000 UNITS PO CPEP: 12000.0000 [IU] | ORAL_CAPSULE | Freq: Three times a day (TID) | ORAL | Status: DC

## 2015-06-01 MED ORDER — PANTOPRAZOLE SODIUM 40 MG PO TBEC: 40.0000 mg | DELAYED_RELEASE_TABLET | Freq: Every day | ORAL | Status: DC

## 2015-06-01 MED ORDER — PHENYTOIN SODIUM EXTENDED 300 MG PO CAPS: 300.0000 mg | ORAL_CAPSULE | Freq: Every day | ORAL | Status: DC

## 2015-06-01 MED ORDER — THIAMINE HCL 100 MG PO TABS: 100.0000 mg | ORAL_TABLET | Freq: Every day | ORAL | Status: DC

## 2015-06-01 NOTE — Care Management Note (Signed)
Case Management Note  Patient Details  Name: Brandy Bishop MRN: [id number] Date of Birth: 02-10-1960  Subjective/Objective:                 Spoke with patient at the bedside. She states that she goe sto [facility] on [address]. She denies needing additional help with medication beyond her medicaid coverage. She states that she lives with her fiance. She denies any HH needs.    Action/Plan:  Anticipate DC to home, self care today.  Expected Discharge Date:                  Expected Discharge Plan:  Home/Self Care  In-House Referral:     Discharge planning Services  CM Consult  Post Acute Care Choice:    Choice offered to:     DME Arranged:    DME Agency:     HH Arranged:    [place] Agency:     Status of Service:  Completed, signed off  Medicare Important Message Given:    Date Medicare IM Given:    Medicare IM give by:    Date Additional Medicare IM Given:    Additional Medicare Important Message give by:     If discussed at [place] of Stay Meetings, dates discussed:    Additional Comments:  Carles Collet, RN 06/01/2015, 12:25 PM

## 2015-06-01 NOTE — Anesthesia Postprocedure Evaluation (Signed)
Anesthesia Post Note  Patient: Brandy Bishop  Procedure(s) Performed: Procedure(s) (LRB): ESOPHAGOGASTRODUODENOSCOPY (EGD) (N/A)  Patient location during evaluation: PACU Anesthesia Type: MAC Level of consciousness: awake and alert Pain management: pain level controlled Vital Signs Assessment: post-procedure vital signs reviewed and stable Respiratory status: spontaneous breathing, nonlabored ventilation, respiratory function stable and patient connected to nasal cannula oxygen Cardiovascular status: stable and blood pressure returned to baseline Anesthetic complications: no    Last Vitals:  Filed Vitals:   06/01/15 0612 06/01/15 0900  BP: 127/74 122/88  Pulse: 90 86  Temp: 37.1 C 36.7 C  Resp: 16 22    Last Pain:  Filed Vitals:   06/01/15 1031  PainSc: 0-No pain                 Sharmon Cheramie S

## 2015-06-01 NOTE — Discharge Summary (Addendum)
Physician Discharge Summary  Brandy Bishop [id number] DOB: 06/26/1960 DOA: 05/29/2015  PCP: No PCP Per Patient - Provided info about [facility] so patient can follow-up with them on discharge  Admit date: 05/29/2015 Discharge date: 06/01/2015  Recommendations for Outpatient Follow-up:  1. Spoke with patient about the need for colonoscopy but she declined to have this done in hospital, she wants to go home and she will follow up with community [facility] clinic. 2. She will continue Protonix 40 mg once daily per GI recommendations.  Discharge Diagnoses:  Principal Problem:   GI bleed Active Problems:   Seizure disorder (HCC)   Asthma with bronchitis   HTN (hypertension)   Alcohol abuse   Chronic pancreatitis (HCC)   Protein-calorie malnutrition, severe (HCC)   Thrombocytopenia (HCC)   Seizure (HCC)   Acute upper GI bleed   Gastroesophageal reflux disease with esophagitis    Discharge Condition: stable   Diet recommendation: as tolerated   History of present illness:  55 y.o. female with medical history significant of medication noncompliance, seizure, hypertension, asthma, GI bleeding, pancreatitis, colitis, alcohol abuse, chronic back pain who presented to [facility] ED with seizures as well as black tarry stool. In regards to seizures, patient's boyfriend witnessed 2 episodes of seizures each lasting about 10 minutes, tonic-clonic movement and then confusion after the seizure. Patient reports not taking phenytoin consistently. On the morning of this admission she had 7 episodes of tarry stool but no associated diarrhea.   In ED, patient was hemodynamically stable. Her phenytoin level was subtherapeutic at 5.8, alcohol level was less than 5. Hemoglobin was 10.8 with further drop down to 7.5. Chest x-ray showed no acute cardiopulmonary findings. GI has seen the patient in consultation. She underwent EGD 05/31/2015 with findings of reflux  esophagitis.  Hospital Course:   Assessment & Plan:  Principal Problem: Seizure disorder ([place]) - Secondary to noncompliance with medications.  - No seizures so far in past 48 hours - Patient did receive loading dose of phenytoin on the admission and currently is on phenytoin 300 mg at bedtime - Prescription provided for phenytoin 300 mg at bedtime.  Active Problems: Acute upper GI bleed / Acute blood loss anemia / Anemia of chronic disease  - Bleeding likely secondary to bone marrow suppression from history of alcohol use - Patient was on octreotide drip and Protonix drip - Patient had EGD 05/31/2015 with findings of reflux esophagitis - She will continue Protonix daily on discharge per GI recommendations  Thrombocytopenia  - Secondary to bone marrow suppression from alcohol abuse - Platelet count stable, 112  Alcohol abuse - Continue CIWA protocol - Continue multivitamin, thiamine and folic acid on discharge  - No reports of withdrawals in hospital - Discussed with the pt about the importance of abstaining form alcohol   Underweight / severe protein calorie malnutrition - Body mass index is 18.2 kg/(m^2). - Seen by nutrition  - Diet as tolerated   DVT prophylaxis: SCDs bilaterally due to risk of bleeding Code Status: full code  Family Communication: Family not at the bedside    Consultants:   GI, Dr. [name]  Procedures:   EGD 05/31/2015- reflux esophagitis   Antimicrobials:   None  Signed:  Leisa Lenz, MD  [facility] 06/01/2015, 10:41 AM  Pager #: [phone]  Time spent in minutes: less than 30 minutes   Discharge Exam: Filed Vitals:   06/01/15 0612 06/01/15 0900  BP: 127/74 122/88  Pulse: 90 86  Temp: 98.8 F (37.1 C) 98.1 F (36.7 C)  Resp: 16 22   Filed Vitals:   05/31/15 1438 06/01/15 0118 06/01/15 0612 06/01/15 0900  BP: 140/75 163/96 127/74 122/88  Pulse: 89 84 90 86  Temp: 99 F (37.2 C) 98.8 F (37.1 C) 98.8  F (37.1 C) 98.1 F (36.7 C)  TempSrc: Oral  Oral Oral  Resp: 21 16 16 22   Height:      Weight:      SpO2: 100% 100% 100% 100%    General: Pt is alert, follows commands appropriately, not in acute distress Cardiovascular: Regular rate and rhythm, S1/S2 +, no murmurs Respiratory: Clear to auscultation bilaterally, no wheezing, no crackles, no rhonchi Abdominal: Soft, non tender, non distended, bowel sounds +, no guarding Extremities: no edema, no cyanosis, pulses palpable bilaterally DP and PT Neuro: Grossly nonfocal  Discharge Instructions  Discharge Instructions    Call MD for:  difficulty breathing, headache or visual disturbances    Complete by:  As directed      Call MD for:  persistant dizziness or light-headedness    Complete by:  As directed      Call MD for:  persistant nausea and vomiting    Complete by:  As directed      Call MD for:  severe uncontrolled pain    Complete by:  As directed      Diet - low sodium heart healthy    Complete by:  As directed      Discharge instructions    Complete by:  As directed   Continue Protonix daily Please abstain from alcohol Continue phenytoin at bedtime as prescribed for seizures     Increase activity slowly    Complete by:  As directed             Medication List    STOP taking these medications        albuterol 108 (90 Base) MCG/ACT inhaler  Commonly known as:  PROVENTIL HFA;VENTOLIN HFA     azithromycin 250 MG tablet  Commonly known as:  ZITHROMAX      TAKE these medications        atenolol 25 MG tablet  Commonly known as:  TENORMIN  Take 2 tablets (50 mg total) by mouth daily.     feeding supplement Liqd  Take 1 Container by mouth 3 (three) times daily between meals.     folic acid 1 MG tablet  Commonly known as:  FOLVITE  Take 1 tablet (1 mg total) by mouth daily.     lipase/protease/amylase 12000 units Cpep capsule  Commonly known as:  CREON  Take 1 capsule (12,000 Units total) by mouth 3 (three)  times daily before meals.     multivitamin with minerals Tabs tablet  Take 1 tablet by mouth daily.     pantoprazole 40 MG tablet  Commonly known as:  PROTONIX  Take 1 tablet (40 mg total) by mouth daily.     phenytoin 300 MG ER capsule  Commonly known as:  DILANTIN  Take 1 capsule (300 mg total) by mouth at bedtime.     thiamine 100 MG tablet  Take 1 tablet (100 mg total) by mouth daily.           Follow-up Information    Schedule an appointment as soon as possible for a visit with [place].   Why:  Follow up appt after recent hospitalization   Contact information:  [address] [place] [phone]       The results of significant diagnostics from this hospitalization (including imaging, microbiology, ancillary and laboratory) are listed below for reference.    Significant Diagnostic Studies: Ct Head Wo Contrast  05/30/2015  CLINICAL DATA:  Acute onset of seizure.  Initial encounter. EXAM: CT HEAD WITHOUT CONTRAST TECHNIQUE: Contiguous axial images were obtained from the base of the skull through the vertex without intravenous contrast. COMPARISON:  CT of the head performed 10/24/2012 FINDINGS: There is no evidence of acute infarction, mass lesion, or intra- or extra-axial hemorrhage on CT. Prominence of the ventricles and sulci reflects mild to moderate cortical volume loss. 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 Mild periventricular and subcortical white matter change likely reflects small vessel ischemic microangiopathy. The brainstem and fourth ventricle are within normal limits. The basal ganglia are unremarkable in appearance. No mass effect or midline shift is seen. There is no evidence of fracture; visualized osseous structures are unremarkable in appearance. The visualized portions of the orbits are within normal limits. The paranasal sinuses and  mastoid air cells are well-aerated. No significant soft tissue abnormalities are seen. IMPRESSION: 1. No acute intracranial pathology seen on CT. 2. Mild to moderate cortical volume loss and scattered small vessel ischemic microangiopathy. 3. 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 Electronically Signed   By: Garald Balding M.D.   On: 05/30/2015 00:26   Dg Chest Port 1 View  05/30/2015  CLINICAL DATA:  Status post seizure.  Cough.  Initial encounter. EXAM: PORTABLE CHEST 1 VIEW COMPARISON:  Chest radiograph performed 02/25/2015 FINDINGS: The lungs are well-aerated and clear. There is no evidence of focal opacification, pleural effusion or pneumothorax. The cardiomediastinal silhouette is within normal limits. No acute osseous abnormalities are seen. IMPRESSION: No acute cardiopulmonary process seen. Electronically Signed   By: Garald Balding M.D.   On: 05/30/2015 02:35    Microbiology: Recent Results (from the past 240 hour(s))  MRSA PCR Screening     Status: None   Collection Time: 05/30/15  8:35 AM  Result Value Ref Range Status   MRSA by PCR NEGATIVE NEGATIVE Final    Comment:        The GeneXpert MRSA Assay (FDA approved for NASAL specimens only), is one component of a comprehensive MRSA colonization surveillance program. It is not intended to diagnose MRSA infection nor to guide or monitor treatment for MRSA infections.      Labs: Basic Metabolic Panel:  Recent Labs Lab 05/29/15 2352 05/30/15 0241 05/31/15 0722  NA 137 136 139  K 4.3 4.0 4.0  CL 102 100* 108  CO2 22 22 22   GLUCOSE 118* 136* 119*  BUN 11 9 <5*  CREATININE 0.60 0.65 0.54  CALCIUM 8.7* 8.3* 8.2*   Liver Function Tests:  Recent Labs Lab 05/29/15 2352 05/30/15 0241  AST 52* 51*  ALT 25 23  ALKPHOS 62 55  BILITOT 0.7 1.0  PROT 6.7 6.1*  ALBUMIN 3.6 3.4*   No results for input(s): LIPASE, AMYLASE in the last 168 hours. No results for input(s): AMMONIA in  the last 168 hours. CBC:  Recent Labs Lab 05/29/15 2352 05/30/15 0241 05/30/15 0954 05/31/15 0722 05/31/15 2015 06/01/15 0430  WBC 6.0 4.9 5.2 4.2 5.3 7.8  NEUTROABS 4.0  --   --   --   --   --   HGB 8.5* 7.5* 7.8* 7.3*  7.2* 7.3*  HCT 24.8* 22.6* 23.7* 22.2* 21.9* 22.2*  MCV 95.4 94.6 95.2 96.1 99.1 96.5  PLT 105* 98* 101* 112* 117* 119*   Cardiac Enzymes: No results for input(s): CKTOTAL, CKMB, CKMBINDEX, TROPONINI in the last 168 hours. BNP: BNP (last 3 results) No results for input(s): BNP in the last 8760 hours.  ProBNP (last 3 results) No results for input(s): PROBNP in the last 8760 hours.  CBG:  Recent Labs Lab 05/30/15 0906 05/31/15 0754 06/01/15 0614 06/01/15 0751  GLUCAP 103* 119* 95 103*

## 2015-06-01 NOTE — Consult Note (Signed)
K[place]ury Cutbirth, EdD 

## 2015-06-01 NOTE — Discharge Instructions (Signed)
Esophagitis °Esophagitis is inflammation of the esophagus. The esophagus is the tube that carries food and liquids from your mouth to your stomach. Esophagitis can cause soreness or pain in the esophagus. This condition can make it difficult and painful to swallow.  °CAUSES °Most causes of esophagitis are not serious. Common causes of this condition include: °· Gastroesophageal reflux disease (GERD). This is when stomach contents move back up into the esophagus (reflux). °· Repeated vomiting. °· An allergic-type reaction, especially caused by food allergies (eosinophilic esophagitis). °· Injury to the esophagus by swallowing large pills with or without water, or swallowing certain types of medicines. °· Swallowing (ingesting) harmful chemicals, such as household cleaning products. °· Heavy alcohol use. °· An infection of the esophagus. This most often occurs in people who have a weakened immune system. °· Radiation or chemotherapy treatment for cancer. °· Certain diseases such as sarcoidosis, Crohn disease, and scleroderma. °SYMPTOMS °Symptoms of this condition include: °· Difficult or painful swallowing. °· Pain with swallowing acidic liquids, such as citrus juices. °· Pain with burping. °· Chest pain. °· Difficulty breathing. °· Nausea. °· Vomiting. °· Pain in the abdomen. °· Weight loss. °· Ulcers in the mouth. °· Patches of white material in the mouth (candidiasis). °· Fever. °· Coughing up blood or vomiting blood. °· Stool that is black, tarry, or bright red. °DIAGNOSIS °Your health care provider will take a medical history and perform a physical exam. You may also have other tests, including: °· An endoscopy to examine your stomach and esophagus with a small camera. °· A test that measures the acidity level in your esophagus. °· A test that measures how much pressure is on your esophagus. °· A barium swallow or modified barium swallow to show the shape, size, and functioning of your esophagus. °· Allergy  tests. °TREATMENT °Treatment for this condition depends on the cause of your esophagitis. In some cases, steroids or other medicines may be given to help relieve your symptoms or to treat the underlying cause of your condition. You may have to make some lifestyle changes, such as: °· Avoiding alcohol. °· Quitting smoking. °· Changing your diet. °· Exercising. °· Changing your sleep habits and your sleep environment. °HOME CARE INSTRUCTIONS °Take these actions to decrease your discomfort and to help avoid complications. °Diet °· Follow a diet as recommended by your health care provider. This may involve avoiding foods and drinks such as: °¨ Coffee and tea (with or without caffeine). °¨ Drinks that contain alcohol. °¨ Energy drinks and sports drinks. °¨ Carbonated drinks or sodas. °¨ Chocolate and cocoa. °¨ Peppermint and mint flavorings. °¨ Garlic and onions. °¨ Horseradish. °¨ Spicy and acidic foods, including peppers, chili powder, curry powder, vinegar, hot sauces, and barbecue sauce. °¨ Citrus fruit juices and citrus fruits, such as oranges, lemons, and limes. °¨ Tomato-based foods, such as red sauce, chili, salsa, and pizza with red sauce. °¨ Fried and fatty foods, such as donuts, french fries, potato chips, and high-fat dressings. °¨ High-fat meats, such as hot dogs and fatty cuts of red and white meats, such as rib eye steak, sausage, ham, and bacon. °¨ High-fat dairy items, such as whole milk, butter, and cream cheese. °· Eat small, frequent meals instead of large meals. °· Avoid drinking large amounts of liquid with your meals. °· Avoid eating meals during the 2-3 hours before bedtime. °· Avoid lying down right after you eat. °· Do not exercise right after you eat. °· Avoid foods and drinks that seem to   make your symptoms worse. General Instructions  Pay attention to any changes in your symptoms.  Take over-the-counter and prescription medicines only as told by your health care provider. Do not take  aspirin, ibuprofen, or other NSAIDs unless your health care provider told you to do so.  If you have trouble taking pills, use a pill splitter to decrease the size of the pill. This will decrease the chance of the pill getting stuck or injuring your esophagus on the way down. Also, drink water after you take a pill.  Do not use any tobacco products, including cigarettes, chewing tobacco, and e-cigarettes. If you need help quitting, ask your health care provider.  Wear loose-fitting clothing. Do not wear anything tight around your waist that causes pressure on your abdomen.  Raise (elevate) the head of your bed about 6 inches (15 cm).  Try to reduce your stress, such as with yoga or meditation. If you need help reducing stress, ask your health care provider.  If you are overweight, reduce your weight to an amount that is healthy for you. Ask your health care provider for guidance about a safe weight loss goal.  Keep all follow-up visits as told by your health care provider. This is important. SEEK MEDICAL CARE IF:  You have new symptoms.  You have unexplained weight loss.  You have difficulty swallowing, or it hurts to swallow.  You have wheezing or a persistent cough.  Your symptoms do not improve with treatment.  You have frequent heartburn for more than two weeks. SEEK IMMEDIATE MEDICAL CARE IF:  You have severe pain in your arms, neck, jaw, teeth, or back.  You feel sweaty, dizzy, or light-headed.  You have chest pain or shortness of breath.  You vomit and your vomit looks like blood or coffee grounds.  Your stool is bloody or black.  You have a fever.  You cannot swallow, drink, or eat.   This information is not intended to replace advice given to you by your health care provider. Make sure you discuss any questions you have with your health care provider.   Document Released: 02/29/2004 Document Revised: 10/12/2014 Document Reviewed: 05/18/2014 Elsevier Interactive  Patient Education [employer].   Alcohol abuse is any pattern of alcohol consumption that harms your health, relationships, or work. Alcohol abuse can affect how your body breaks down and absorbs nutrients from food by causing your liver to work abnormally. Additionally, many people who abuse alcohol do not eat enough carbohydrates, protein, fat, vitamins, and minerals. This can cause poor nutrition (malnutrition) and a lack of nutrients (nutrient deficiencies), which can lead to further complications. Nutrients that are commonly lacking (deficient) among people who abuse alcohol include:  Vitamins.  Vitamin A. This is stored in your liver. It is important for your vision, metabolism, and ability to fight off infections (immunity).  B vitamins. These include vitamins such as folate, thiamin, and niacin. These are important in new cell growth and maintenance.  Vitamin C. This plays an important role in iron absorption, wound healing, and immunity.  Vitamin D. This is produced by your liver, but you can also get vitamin D from food. Vitamin D is necessary for your body to absorb and use calcium.  Minerals.  Calcium. This is important for your bones and your heart and blood vessel (cardiovascular) function.  Iron. This is important for blood, muscle, and nervous system functioning.  Magnesium. This plays an important role in muscle and nerve function, and it helps  to control blood sugar and blood pressure.  Zinc. This is important for the normal function of your nervous system and digestive system (gastrointestinal tract). Nutrition is an essential component of therapy for alcohol abuse. Your health care provider or dietitian will work with you to design a plan that can help restore nutrients to your body and prevent potential complications. WHAT IS MY PLAN? Your dietitian may develop a specific diet plan that is based on your condition and any other complications you may have. A diet  plan will commonly include:  A balanced diet.  Grains: 6-8 oz per day.  Vegetables: 2-3 cups per day.  Fruits: 1-2 cups per day.  Meat and other protein: 5-6 oz per day.  Dairy: 2-3 cups per day.  Vitamin and mineral supplements. WHAT DO I NEED TO KNOW ABOUT ALCOHOL AND NUTRITION?  Consume foods that are high in antioxidants, such as grapes, berries, nuts, green tea, and dark green and orange vegetables. This can help to counteract some of the stress that is placed on your liver by consuming alcohol.  Avoid food and drinks that are high in fat and sugar. Foods such as sugared soft drinks, salty snack foods, and candy contain empty calories. This means that they lack important nutrients such as protein, fiber, and vitamins.  Eat frequent meals and snacks. Try to eat 5-6 small meals each day.  Eat a variety of fresh fruits and vegetables each day. This will help you get plenty of water, fiber, and vitamins in your diet.  Drink plenty of water and other clear fluids. Try to drink at least 48-64 oz (1.5-2 L) of water per day.  If you are a vegetarian, eat a variety of protein-rich foods. Pair whole grains with plant-based proteins at meals and snacks to obtain the greatest nutrient benefit from your food. For example, eat rice with beans, put peanut butter on whole-grain toast, or eat oatmeal with sunflower seeds.  Soak beans and whole grains overnight before cooking. This can help your body to absorb the nutrients more easily.  Include foods fortified with vitamins and minerals in your diet. Commonly fortified foods include milk, orange juice, cereal, and bread.  If you are malnourished, your dietitian may recommend a high-protein, high-calorie diet. This may include:  2,000-3,000 calories (kilocalories) per day.  70-100 grams of protein per day.  Your health care provider may recommend a complete nutritional supplement beverage. This can help to restore calories, protein, and  vitamins to your body. Depending on your condition, you may be advised to consume this instead of or in addition to meals.  Limit your intake of caffeine. Replace drinks like coffee and black tea with decaffeinated coffee and herbal tea.  Eat a variety of foods that are high in omega fatty acids. These include fish, nuts and seeds, and soybeans. These foods may help your liver to recover and may also stabilize your mood.  Certain medicines may cause changes in your appetite, taste, and weight. Work with your health care provider and dietitian to make any adjustments to your medicines and diet plan.  Include other healthy lifestyle choices in your daily routine.  Be physically active.  Get enough sleep.  Spend time doing activities that you enjoy.  If you are unable to take in enough food and calories by mouth, your health care provider may recommend a feeding tube. This is a tube that passes through your nose and throat, directly into your stomach. Nutritional supplement beverages can be given  to you through the feeding tube to help you get the nutrients you need.  Take vitamin or mineral supplements as recommended by your health care provider. WHAT FOODS CAN I EAT? Grains Enriched pasta. Enriched rice. Fortified whole-grain bread. Fortified whole-grain cereal. Barley. Brown rice. Quinoa. [place]. Vegetables All fresh, frozen, and canned vegetables. Spinach. Kale. Artichoke. Carrots. Winter squash and pumpkin. Sweet potatoes. Broccoli. Cabbage. Cucumbers. Tomatoes. Sweet peppers. Green beans. Peas. Corn. Fruits All fresh and frozen fruits. Berries. Grapes. Mango. Papaya. Guava. Cherries. Apples. Bananas. Peaches. Plums. Pineapple. Watermelon. Cantaloupe. Oranges. Avocado. Meats and Other Protein Sources Beef liver. Lean beef. Pork. Fresh and canned chicken. Fresh fish. Oysters. Sardines. Canned tuna. Shrimp. Eggs with yolks. Nuts and seeds. Peanut butter. Beans and lentils. Soybeans.  Tofu. Dairy Whole, low-fat, and nonfat milk. Whole, low-fat, and nonfat yogurt. Cottage cheese. Sour cream. Hard and soft cheeses. Beverages Water. Herbal tea. Decaffeinated coffee. Decaffeinated green tea. 100% fruit juice. 100% vegetable juice. Instant breakfast shakes. Condiments Ketchup. Mayonnaise. Mustard. Salad dressing. Barbecue sauce. Sweets and Desserts Sugar-free ice cream. Sugar-free pudding. Sugar-free gelatin. Fats and Oils Butter. Vegetable oil, flaxseed oil, olive oil, and walnut oil. Other Complete nutrition shakes. Protein bars. Sugar-free gum. The items listed above may not be a complete list of recommended foods or beverages. Contact your dietitian for more options. WHAT FOODS ARE NOT RECOMMENDED? Grains Sugar-sweetened breakfast cereals. Flavored instant oatmeal. Fried breads. Vegetables Breaded or deep-fried vegetables. Fruits Dried fruit with added sugar. Candied fruit. Canned fruit in syrup. Meats and Other Protein Sources Breaded or deep-fried meats. Dairy Flavored milks. Fried cheese curds or fried cheese sticks. Beverages Alcohol. Sugar-sweetened soft drinks. Sugar-sweetened tea. Caffeinated coffee and tea. Condiments Sugar. Honey. Agave nectar. Molasses. Sweets and Desserts Chocolate. Cake. Cookies. Candy. Other Potato chips. Pretzels. Salted nuts. Candied nuts. The items listed above may not be a complete list of foods and beverages to avoid. Contact your dietitian for more information.   This information is not intended to replace advice given to you by your health care provider. Make sure you discuss any questions you have with your health care provider.   Document Released: 11/15/2004 Document Revised: 02/11/2014 Document Reviewed: 08/24/2013 Elsevier Interactive Patient Education 2016 [employer] Gastrointestinal Bleeding Gastrointestinal (GI) bleeding means there is bleeding somewhere along the digestive tract, between the mouth and  anus. CAUSES  There are many different problems that can cause GI bleeding. Possible causes include:  Esophagitis. This is inflammation, irritation, or swelling of the esophagus.  Hemorrhoids.These are veins that are full of blood (engorged) in the rectum. They cause pain, inflammation, and may bleed.  Anal fissures.These are areas of painful tearing which may bleed. They are often caused by passing hard stool.  Diverticulosis.These are pouches that form on the colon over time, with age, and may bleed significantly.  Diverticulitis.This is inflammation in areas with diverticulosis. It can cause pain, fever, and bloody stools, although bleeding is rare.  Polyps and cancer. Colon cancer often starts out as precancerous polyps.  Gastritis and ulcers.Bleeding from the upper gastrointestinal tract (near the stomach) may travel through the intestines and produce black, sometimes tarry, often bad smelling stools. In certain cases, if the bleeding is fast enough, the stools may not be black, but red. This condition may be life-threatening. SYMPTOMS   Vomiting bright red blood or material that looks like coffee grounds.  Bloody, black, or tarry stools. DIAGNOSIS  Your caregiver may diagnose your condition by taking your history and performing a physical exam.  More tests may be needed, including:  X-rays and other imaging tests.  Esophagogastroduodenoscopy (EGD). This test uses a flexible, lighted tube to look at your esophagus, stomach, and small intestine.  Colonoscopy. This test uses a flexible, lighted tube to look at your colon. TREATMENT  Treatment depends on the cause of your bleeding.   For bleeding from the esophagus, stomach, small intestine, or colon, the caregiver doing your EGD or colonoscopy may be able to stop the bleeding as part of the procedure.  Inflammation or infection of the colon can be treated with medicines.  Many rectal problems can be treated with creams,  suppositories, or warm baths.  Surgery is sometimes needed.  Blood transfusions are sometimes needed if you have lost a lot of blood. If bleeding is slow, you may be allowed to go home. If there is a lot of bleeding, you will need to stay in the hospital for observation. HOME CARE INSTRUCTIONS   Take any medicines exactly as prescribed.  Keep your stools soft by eating foods that are high in fiber. These foods include whole grains, legumes, fruits, and vegetables. Prunes (1 to 3 a day) work well for many people.  Drink enough fluids to keep your urine clear or pale yellow. SEEK IMMEDIATE MEDICAL CARE IF:   Your bleeding increases.  You feel lightheaded, weak, or you faint.  You have severe cramps in your back or abdomen.  You pass large blood clots in your stool.  Your problems are getting worse. MAKE SURE YOU:   Understand these instructions.  Will watch your condition.  Will get help right away if you are not doing well or get worse.   This information is not intended to replace advice given to you by your health care provider. Make sure you discuss any questions you have with your health care provider.   Document Released: 01/19/2000 Document Revised: 01/08/2012 Document Reviewed: 07/11/2014 Elsevier Interactive Patient Education [employer].

## 2015-06-02 ENCOUNTER — Inpatient Hospital Stay: Payer: Medicaid Other

## 2015-06-03 ENCOUNTER — Encounter (HOSPITAL_COMMUNITY): Payer: Self-pay | Admitting: Internal Medicine

## 2016-02-14 ENCOUNTER — Encounter (HOSPITAL_COMMUNITY): Payer: Self-pay | Admitting: *Deleted

## 2016-02-14 ENCOUNTER — Emergency Department (HOSPITAL_COMMUNITY)
Admission: EM | Admit: 2016-02-14 | Discharge: 2016-02-14 | Disposition: A | Discharge status: Home / Self Care | Payer: Medicaid Other | Attending: Emergency Medicine | Admitting: Emergency Medicine

## 2016-02-14 ENCOUNTER — Emergency Department (HOSPITAL_COMMUNITY): Payer: Medicaid Other

## 2016-02-14 DIAGNOSIS — Z79899 Other long term (current) drug therapy: Secondary | ICD-10-CM | POA: Insufficient documentation

## 2016-02-14 DIAGNOSIS — S0083XA Contusion of other part of head, initial encounter: Secondary | ICD-10-CM | POA: Diagnosis not present

## 2016-02-14 DIAGNOSIS — Y939 Activity, unspecified: Secondary | ICD-10-CM | POA: Diagnosis not present

## 2016-02-14 DIAGNOSIS — Z23 Encounter for immunization: Secondary | ICD-10-CM | POA: Diagnosis not present

## 2016-02-14 DIAGNOSIS — Y9289 Other specified places as the place of occurrence of the external cause: Secondary | ICD-10-CM | POA: Insufficient documentation

## 2016-02-14 DIAGNOSIS — J45909 Unspecified asthma, uncomplicated: Secondary | ICD-10-CM | POA: Diagnosis not present

## 2016-02-14 DIAGNOSIS — I10 Essential (primary) hypertension: Secondary | ICD-10-CM | POA: Insufficient documentation

## 2016-02-14 DIAGNOSIS — Y999 Unspecified external cause status: Secondary | ICD-10-CM | POA: Diagnosis not present

## 2016-02-14 DIAGNOSIS — S0990XA Unspecified injury of head, initial encounter: Secondary | ICD-10-CM | POA: Diagnosis present

## 2016-02-14 MED ORDER — OXYCODONE-ACETAMINOPHEN 5-325 MG PO TABS
1.0000 | ORAL_TABLET | Freq: Once | ORAL | Status: AC
  Administered 2016-02-14: 1 via ORAL
  Filled 2016-02-14: qty 1

## 2016-02-14 MED ORDER — FLUORESCEIN SODIUM 0.6 MG OP STRP
1.0000 | ORAL_STRIP | Freq: Once | OPHTHALMIC | Status: AC
  Administered 2016-02-14: 1 via OPHTHALMIC
  Filled 2016-02-14: qty 1

## 2016-02-14 MED ORDER — HYDROCODONE-ACETAMINOPHEN 5-325 MG PO TABS: 1.0000 | ORAL_TABLET | Freq: Four times a day (QID) | ORAL | 0 refills | Status: DC | PRN

## 2016-02-14 MED ORDER — PROPARACAINE HCL 0.5 % OP SOLN
1.0000 [drp] | Freq: Once | OPHTHALMIC | Status: AC
  Administered 2016-02-14: 1 [drp] via OPHTHALMIC
  Filled 2016-02-14: qty 15

## 2016-02-14 MED ORDER — TETANUS-DIPHTH-ACELL PERTUSSIS 5-2.5-18.5 LF-MCG/0.5 IM SUSP
0.5000 mL | Freq: Once | INTRAMUSCULAR | Status: AC
  Administered 2016-02-14: 0.5 mL via INTRAMUSCULAR
  Filled 2016-02-14: qty 0.5

## 2016-02-14 MED ORDER — NAPROXEN 500 MG PO TABS: 500.0000 mg | ORAL_TABLET | Freq: Two times a day (BID) | ORAL | 0 refills | Status: DC

## 2016-02-14 NOTE — ED Notes (Signed)
Patient transported to X-ray 

## 2016-02-14 NOTE — ED Notes (Signed)
Pt given gingerale and crackers 

## 2016-02-14 NOTE — ED Triage Notes (Addendum)
Unknown loc  Ice pack offered pt refused
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
## 2016-02-14 NOTE — ED Notes (Signed)
Pt complaining of chest and back pain. Rn notified.
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
## 2017-02-25 ENCOUNTER — Emergency Department (HOSPITAL_COMMUNITY)
Admission: EM | Admit: 2017-02-25 | Discharge: 2017-02-25 | Disposition: A | Discharge status: Home / Self Care | Payer: Medicaid Other | Attending: Emergency Medicine | Admitting: Emergency Medicine

## 2017-02-25 ENCOUNTER — Encounter (HOSPITAL_COMMUNITY): Payer: Self-pay

## 2017-02-25 ENCOUNTER — Emergency Department (HOSPITAL_COMMUNITY): Payer: Medicaid Other

## 2017-02-25 DIAGNOSIS — L853 Xerosis cutis: Secondary | ICD-10-CM | POA: Diagnosis not present

## 2017-02-25 DIAGNOSIS — L299 Pruritus, unspecified: Secondary | ICD-10-CM | POA: Diagnosis not present

## 2017-02-25 DIAGNOSIS — J45909 Unspecified asthma, uncomplicated: Secondary | ICD-10-CM | POA: Insufficient documentation

## 2017-02-25 DIAGNOSIS — M25532 Pain in left wrist: Secondary | ICD-10-CM

## 2017-02-25 DIAGNOSIS — Z79899 Other long term (current) drug therapy: Secondary | ICD-10-CM | POA: Insufficient documentation

## 2017-02-25 DIAGNOSIS — I10 Essential (primary) hypertension: Secondary | ICD-10-CM | POA: Insufficient documentation

## 2017-02-25 DIAGNOSIS — M1990 Unspecified osteoarthritis, unspecified site: Secondary | ICD-10-CM | POA: Insufficient documentation

## 2017-02-25 MED ORDER — NAPROXEN 250 MG PO TABS
500.0000 mg | ORAL_TABLET | Freq: Once | ORAL | Status: AC
  Administered 2017-02-25: 500 mg via ORAL
  Filled 2017-02-25: qty 2

## 2017-02-25 MED ORDER — IBUPROFEN 400 MG PO TABS: 400.0000 mg | ORAL_TABLET | Freq: Once | ORAL | Status: DC

## 2017-02-25 MED ORDER — NAPROXEN 500 MG PO TABS: 500.0000 mg | ORAL_TABLET | Freq: Two times a day (BID) | ORAL | 0 refills | Status: DC

## 2017-02-25 NOTE — ED Notes (Signed)
Patient transported to X-ray 

## 2017-02-25 NOTE — ED Triage Notes (Signed)
Patient complains of general itching to skin for some time and now has left wrist pain with ROM, denies trauma. No swelling, no deformity

## 2017-02-25 NOTE — ED Provider Notes (Signed)
[facility] EMERGENCY DEPARTMENT Provider Note   CSN: [id number] Arrival date & time: 02/25/17  9485     History   Chief Complaint Chief Complaint  Patient presents with  . wrist pain/itching    HPI Brandy Bishop is a 57 y.o. female.  Brandy Bishop is a 57 y.o. Female history of asthma, hypertension, alcoholism, polysubstance abuse and seizures, presents to the ED for evaluation of generalized itching for the past several months.  Patient also complaining of some left wrist pain with range of motion patient denies any trauma or injury reports his pain is been present for several weeks.  Patient denies any swelling or deformity.  No weakness, numbness or tingling.  Patient has not been doing anything to treat her symptoms at home.  Patient reports she has not been to see her PCP in a while, but was concerned that her itching skin may be due to lead, patient reports flaky rash over arms and legs, denies any fevers or chills, no redness, swelling, bruising, pustules or vesicles.       Past Medical History:  Diagnosis Date  . Alcoholism ([place]) 2011  . Asthma   . Chronic lower back pain   . Colitis 04/2014.   Bloody diarrhea  . Daily headache   . GIB (gastrointestinal bleeding)   . Hypertension   . Pancreatitis    Chronic pancreatitis noted on CT scan in 04/2014.  Marland Kitchen Polysubstance abuse ([place]) 2011  . Pyelonephritis 05/2012  . Seizures ([place])    "don't know what they are from" (04/14/2014)    Patient Active Problem List   Diagnosis Date Noted  . Gastroesophageal reflux disease with esophagitis   . Seizure ([place]) 05/30/2015  . Cough   . Subtherapeutic phenytoin level   . Acute upper GI bleed   . Hypomagnesemia 04/16/2014  . Hypokalemia 04/16/2014  . Thrombocytopenia ([place]) 04/16/2014  . Hepatic steatosis 04/15/2014  . Alcohol abuse 04/15/2014  . Chronic pancreatitis ([place]) 04/15/2014  . Protein-calorie malnutrition, severe ([place]) 04/15/2014  .  Severe protein-calorie malnutrition ([place]) 04/15/2014  . Bleeding gastrointestinal   . Abnormal CT scan, colon   . Rectal bleeding   . Abdominal pain   . Alcoholic hepatitis without ascites   . GI bleed 04/14/2014  . Viral URI with cough 05/07/2012  . Pyelonephritis 05/07/2012  . Seizure disorder ([place]) 05/07/2012  . Asthma with bronchitis 05/07/2012  . HTN (hypertension) 05/07/2012    Past Surgical History:  Procedure Laterality Date  . ESOPHAGOGASTRODUODENOSCOPY N/A 05/31/2015   Procedure: ESOPHAGOGASTRODUODENOSCOPY (EGD);  Surgeon: Irene Shipper, MD;  Location: [facility] ENDOSCOPY;  Service: Endoscopy;  Laterality: N/A;  . NO PAST SURGERIES      OB History    No data available       Home Medications    Prior to Admission medications   Medication Sig Start Date End Date Taking? Authorizing Provider  albuterol (PROVENTIL HFA;VENTOLIN HFA) 108 (90 Base) MCG/ACT inhaler Inhale 1-2 puffs into the lungs every 6 (six) hours as needed for wheezing or shortness of breath.    [provider]  atenolol (TENORMIN) 25 MG tablet Take 2 tablets (50 mg total) by mouth daily. Patient taking differently: Take 25 mg by mouth daily.  10/15/14   Neese, Coralie Carpen, NP  feeding supplement, RESOURCE BREEZE, (RESOURCE BREEZE) LIQD Take 1 Container by mouth 3 (three) times daily between meals. Patient not taking: Reported on 02/25/2015 04/16/14   Lucious Groves, DO  folic acid (  FOLVITE) 1 MG tablet Take 1 tablet (1 mg total) by mouth daily. Patient not taking: Reported on 02/25/2015 04/16/14   Lucious Groves, DO  HYDROcodone-acetaminophen (NORCO/VICODIN) 5-325 MG tablet Take 1-2 tablets by mouth every 6 (six) hours as needed. 02/14/16   Horton, Barbette Hair, MD  lipase/protease/amylase (CREON) 12000 units CPEP capsule Take 1 capsule (12,000 Units total) by mouth 3 (three) times daily before meals. Patient not taking: Reported on 02/14/2016 06/01/15   Robbie Lis, MD  Multiple Vitamin (MULTIVITAMIN WITH  MINERALS) TABS Take 1 tablet by mouth daily. Patient not taking: Reported on 04/14/2014 05/11/12   Regalado, Jerald Kief A, MD  naproxen (NAPROSYN) 500 MG tablet Take 1 tablet (500 mg total) by mouth 2 (two) times daily. 02/14/16   Horton, Barbette Hair, MD  naproxen (NAPROSYN) 500 MG tablet Take 1 tablet (500 mg total) by mouth 2 (two) times daily. 02/25/17   Jacqlyn Larsen, PA-C  pantoprazole (PROTONIX) 40 MG tablet Take 1 tablet (40 mg total) by mouth daily. 06/01/15   Robbie Lis, MD  phenytoin (DILANTIN) 300 MG ER capsule Take 1 capsule (300 mg total) by mouth at bedtime. 06/01/15   Robbie Lis, MD  thiamine 100 MG tablet Take 1 tablet (100 mg total) by mouth daily. Patient not taking: Reported on 02/14/2016 06/01/15   Robbie Lis, MD    Family History Family History  Problem Relation Age of Onset  . Cancer Mother   . Cancer Brother     Social History Social History   Tobacco Use  . Smoking status: Never Smoker  . Smokeless tobacco: Never Used  Substance Use Topics  . Alcohol use: Yes    Alcohol/week: 6.0 oz    Types: 10 Cans of beer per week    Comment: 04/14/2014 "I drink all of them; I'm not drinking anything anymore"  . Drug use: No     Allergies   Penicillins   Review of Systems Review of Systems  Constitutional: Negative for chills and fatigue.  Musculoskeletal: Positive for arthralgias (L wrist). Negative for joint swelling.  Skin: Positive for rash (Pruritis). Negative for wound.  Neurological: Negative for weakness and numbness.     Physical Exam Updated Vital Signs BP (!) 150/106   Pulse 74   Temp 98.5 F (36.9 C)   Resp 18   SpO2 98%   Physical Exam  Constitutional: She appears well-developed and well-nourished. No distress.  HENT:  Head: Normocephalic and atraumatic.  Eyes: Right eye exhibits no discharge. Left eye exhibits no discharge.  Pulmonary/Chest: Effort normal. No respiratory distress.  Musculoskeletal:  Mild tenderness to palpation over  the left wrist, primarily over the dorsal aspect, no erythema, swelling or palpable deformity, full active range of motion, slightly limited by pain, 2+ radial and ulnar pulses, with good capillary refill, sensation intact, 5/5 grip strength  Neurological: She is alert. Coordination normal.  Skin: She is not diaphoretic.  Chronic appearing dry skin over her legs and arms, as well as trunk, no obvious rash, there is evidence of some excoriation, no pustules, vesicles, purpura or petechiae, no erythema, no areas of fluctuance or induration.  Psychiatric: She has a normal mood and affect. Her behavior is normal.  Nursing note and vitals reviewed.    ED Treatments / Results  Labs (all labs ordered are listed, but only abnormal results are displayed) Labs Reviewed - No data to display  EKG  EKG Interpretation None       Radiology  Dg Wrist Complete Left  Result Date: 02/25/2017 CLINICAL DATA:  Generalized left wrist pain for 3 months. No known injury. EXAM: LEFT WRIST - COMPLETE 3+ VIEW COMPARISON:  None. FINDINGS: Joint space loss in the radiocarpal joint. Early osteoarthritic changes at the 1st carpometacarpal joint. No acute bony abnormality. Specifically, no fracture, subluxation, or dislocation. IMPRESSION: Mild arthritic changes in the left wrist as above. No acute findings. Electronically Signed   By: Rolm Baptise M.D.   On: 02/25/2017 11:33    Procedures Procedures (including critical care time)  Medications Ordered in ED Medications  naproxen (NAPROSYN) tablet 500 mg (500 mg Oral Given 02/25/17 1244)     Initial Impression / Assessment and Plan / ED Course  I have reviewed the triage vital signs and the nursing notes.  Pertinent labs & imaging results that were available during my care of the patient were reviewed by me and considered in my medical decision making (see chart for details).  Patient presents for evaluation of left wrist pain for the past several weeks, no  inciting injury, pain with range of motion, wrist is neurovascularly intact with full active range of motion with some discomfort.  X-ray shows evidence of arthritis, but no fracture or acute bony abnormality.  No swelling, erythema or fevers or chills to suggest septic arthritis.  Patient placed in a wrist splint and treated with Naprosyn.  Will have patient continue to use NSAIDs and follow-up with primary care.  Patient also complaining of generalized pruritus, skin appears chronically dry, no evidence of rashes, no pustules, vesicles, purpura or petechiae.  Will give patient lotion, recommend that she follows up with her primary doctor and she reports she has an appointment next week.  Strict return precautions discussed.  Patient expresses understanding and is in agreement with plan.  Final Clinical Impressions(s) / ED Diagnoses   Final diagnoses:  Dry skin  Pruritus  Left wrist pain  Arthritis    ED Discharge Orders        Ordered    naproxen (NAPROSYN) 500 MG tablet  2 times daily     02/25/17 1223       Jacqlyn Larsen, [place] 02/25/17 1353    Isla Pence, MD 02/25/17 [phone]

## 2017-02-25 NOTE — ED Notes (Signed)
Patient undressed and ready for exam

## 2017-02-25 NOTE — ED Notes (Signed)
ED Provider at bedside. 

## 2017-02-25 NOTE — Discharge Instructions (Addendum)
I feel that your itching is likely due to dry skin, please continue applying lotion twice a day.  Your wrist x-ray shows arthritis but no evidence of fracture or fluid in the joint space, please continue to wear wrist brace provided to you in the ED today and take naprosyn for pain.  Please follow-up with your PCP next week as we discussed.  If you have worsening wrist pain with redness, warmth or swelling, develop fevers or chills or other new or concerning symptoms please return to the ED for reevaluation.

## 2017-02-25 NOTE — ED Notes (Signed)
Pt verbalized understanding of discharge instructions and denies any further questions at this time.   

## 2017-02-25 NOTE — ED Notes (Signed)
Patient returned from xray.

## 2017-06-08 ENCOUNTER — Emergency Department (HOSPITAL_COMMUNITY)
Admission: EM | Admit: 2017-06-08 | Discharge: 2017-06-08 | Disposition: A | Discharge status: Home / Self Care | Payer: Medicaid Other | Attending: Emergency Medicine | Admitting: Emergency Medicine

## 2017-06-08 ENCOUNTER — Other Ambulatory Visit: Payer: Self-pay

## 2017-06-08 ENCOUNTER — Encounter (HOSPITAL_COMMUNITY): Payer: Self-pay

## 2017-06-08 DIAGNOSIS — Z79899 Other long term (current) drug therapy: Secondary | ICD-10-CM | POA: Diagnosis not present

## 2017-06-08 DIAGNOSIS — M545 Low back pain, unspecified: Secondary | ICD-10-CM

## 2017-06-08 DIAGNOSIS — I10 Essential (primary) hypertension: Secondary | ICD-10-CM | POA: Diagnosis not present

## 2017-06-08 DIAGNOSIS — D649 Anemia, unspecified: Secondary | ICD-10-CM

## 2017-06-08 DIAGNOSIS — M549 Dorsalgia, unspecified: Secondary | ICD-10-CM | POA: Diagnosis present

## 2017-06-08 DIAGNOSIS — J45909 Unspecified asthma, uncomplicated: Secondary | ICD-10-CM | POA: Insufficient documentation

## 2017-06-08 LAB — URINALYSIS, ROUTINE W REFLEX MICROSCOPIC
BILIRUBIN URINE: NEGATIVE
GLUCOSE, UA: NEGATIVE mg/dL
Ketones, ur: NEGATIVE mg/dL
Leukocytes, UA: NEGATIVE
NITRITE: NEGATIVE
PH: 5 (ref 5.0–8.0)
Protein, ur: NEGATIVE mg/dL
SPECIFIC GRAVITY, URINE: 1.003 — AB (ref 1.005–1.030)

## 2017-06-08 LAB — COMPREHENSIVE METABOLIC PANEL
ALBUMIN: 3.3 g/dL — AB (ref 3.5–5.0)
ALT: 23 U/L (ref 14–54)
ANION GAP: 11 (ref 5–15)
AST: 52 U/L — ABNORMAL HIGH (ref 15–41)
Alkaline Phosphatase: 65 U/L (ref 38–126)
BILIRUBIN TOTAL: 0.4 mg/dL (ref 0.3–1.2)
BUN: 6 mg/dL (ref 6–20)
CO2: 22 mmol/L (ref 22–32)
Calcium: 8.1 mg/dL — ABNORMAL LOW (ref 8.9–10.3)
Chloride: 111 mmol/L (ref 101–111)
Creatinine, Ser: 0.49 mg/dL (ref 0.44–1.00)
GFR calc Af Amer: >60 mL/min (ref >60)
GFR calc non Af Amer: >60 mL/min (ref >60)
GLUCOSE: 73 mg/dL (ref 65–99)
POTASSIUM: 3.5 mmol/L (ref 3.5–5.1)
SODIUM: 144 mmol/L (ref 135–145)
TOTAL PROTEIN: 6.5 g/dL (ref 6.5–8.1)

## 2017-06-08 LAB — RAPID URINE DRUG SCREEN, HOSP PERFORMED
Amphetamines: NOT DETECTED
BARBITURATES: NOT DETECTED
Benzodiazepines: NOT DETECTED
COCAINE: NOT DETECTED
Opiates: NOT DETECTED
TETRAHYDROCANNABINOL: NOT DETECTED

## 2017-06-08 LAB — CBC
HEMATOCRIT: 22.4 % — AB (ref 36.0–46.0)
HEMOGLOBIN: 6.4 g/dL — AB (ref 12.0–15.0)
MCH: 20.9 pg — ABNORMAL LOW (ref 26.0–34.0)
MCHC: 28.6 g/dL — AB (ref 30.0–36.0)
MCV: 73.2 fL — ABNORMAL LOW (ref 78.0–100.0)
Platelets: 212 10*3/uL (ref 150–400)
RBC: 3.06 MIL/uL — ABNORMAL LOW (ref 3.87–5.11)
RDW: 20 % — ABNORMAL HIGH (ref 11.5–15.5)
WBC: 4.6 10*3/uL (ref 4.0–10.5)

## 2017-06-08 LAB — ETHANOL: ALCOHOL ETHYL (B): 265 mg/dL — AB (ref <10)

## 2017-06-08 MED ORDER — TRAMADOL HCL 50 MG PO TABS: 50.0000 mg | ORAL_TABLET | Freq: Four times a day (QID) | ORAL | 0 refills | Status: DC | PRN

## 2017-06-08 MED ORDER — SODIUM CHLORIDE 0.9 % IV SOLN
INTRAVENOUS | Status: DC
  Administered 2017-06-08: 1 mL via INTRAVENOUS

## 2017-06-08 MED ORDER — TRAMADOL HCL 50 MG PO TABS
50.0000 mg | ORAL_TABLET | Freq: Four times a day (QID) | ORAL | 0 refills | Status: DC | PRN
Start: 2017-06-08 — End: 2017-06-08

## 2017-06-08 MED ORDER — FERROUS SULFATE 325 (65 FE) MG PO TABS: 325.0000 mg | ORAL_TABLET | Freq: Two times a day (BID) | ORAL | 0 refills | Status: DC

## 2017-06-08 MED ORDER — ACETAMINOPHEN 325 MG PO TABS
650.0000 mg | ORAL_TABLET | Freq: Once | ORAL | Status: AC
  Administered 2017-06-08: 650 mg via ORAL
  Filled 2017-06-08: qty 2

## 2017-06-08 NOTE — ED Provider Notes (Signed)
[facility] EMERGENCY DEPARTMENT Provider Note   CSN: [id number] Arrival date & time: 06/08/17  1332     History   Chief Complaint Chief Complaint  Patient presents with  . Flank Pain    HPI Brandy Bishop is a 57 y.o. female.  Patient c/o back pain for past month. Mod-severe, constant, dull, non radiating, no leg pain. No numbness/weakness. No fever/chills. No dysuria or hematuria.  Pt very poor historian, states pain is all over back - level 5 caveat. Denies specific injury or fall.  Denies acute change in symptoms today.   The history is provided by the patient and the EMS personnel. The history is limited by the condition of the patient.    Past Medical History:  Diagnosis Date  . Alcoholism ([place]) 2011  . Asthma   . Chronic lower back pain   . Colitis 04/2014.   Bloody diarrhea  . Daily headache   . GIB (gastrointestinal bleeding)   . Hypertension   . Pancreatitis    Chronic pancreatitis noted on CT scan in 04/2014.  Marland Kitchen Polysubstance abuse ([place]) 2011  . Pyelonephritis 05/2012  . Seizures ([place])    "don't know what they are from" (04/14/2014)    Patient Active Problem List   Diagnosis Date Noted  . Gastroesophageal reflux disease with esophagitis   . Seizure ([place]) 05/30/2015  . Cough   . Subtherapeutic phenytoin level   . Acute upper GI bleed   . Hypomagnesemia 04/16/2014  . Hypokalemia 04/16/2014  . Thrombocytopenia ([place]) 04/16/2014  . Hepatic steatosis 04/15/2014  . Alcohol abuse 04/15/2014  . Chronic pancreatitis ([place]) 04/15/2014  . Protein-calorie malnutrition, severe ([place]) 04/15/2014  . Severe protein-calorie malnutrition ([place]) 04/15/2014  . Bleeding gastrointestinal   . Abnormal CT scan, colon   . Rectal bleeding   . Abdominal pain   . Alcoholic hepatitis without ascites   . GI bleed 04/14/2014  . Viral URI with cough 05/07/2012  . Pyelonephritis 05/07/2012  . Seizure disorder ([place]) 05/07/2012  . Asthma with bronchitis  05/07/2012  . HTN (hypertension) 05/07/2012    Past Surgical History:  Procedure Laterality Date  . ESOPHAGOGASTRODUODENOSCOPY N/A 05/31/2015   Procedure: ESOPHAGOGASTRODUODENOSCOPY (EGD);  Surgeon: Irene Shipper, MD;  Location: [facility] ENDOSCOPY;  Service: Endoscopy;  Laterality: N/A;  . NO PAST SURGERIES       OB History   None      Home Medications    Prior to Admission medications   Medication Sig Start Date End Date Taking? Authorizing Provider  albuterol (PROVENTIL HFA;VENTOLIN HFA) 108 (90 Base) MCG/ACT inhaler Inhale 1-2 puffs into the lungs every 6 (six) hours as needed for wheezing or shortness of breath.    [provider]  atenolol (TENORMIN) 25 MG tablet Take 2 tablets (50 mg total) by mouth daily. Patient taking differently: Take 25 mg by mouth daily.  10/15/14   Neese, Coralie Carpen, NP  feeding supplement, RESOURCE BREEZE, (RESOURCE BREEZE) LIQD Take 1 Container by mouth 3 (three) times daily between meals. Patient not taking: Reported on 02/25/2015 04/16/14   Lucious Groves, DO  folic acid (FOLVITE) 1 MG tablet Take 1 tablet (1 mg total) by mouth daily. Patient not taking: Reported on 02/25/2015 04/16/14   Lucious Groves, DO  HYDROcodone-acetaminophen (NORCO/VICODIN) 5-325 MG tablet Take 1-2 tablets by mouth every 6 (six) hours as needed. 02/14/16   Horton, Barbette Hair, MD  lipase/protease/amylase (CREON) 12000 units CPEP capsule Take 1 capsule (12,000 Units total) by  mouth 3 (three) times daily before meals. Patient not taking: Reported on 02/14/2016 06/01/15   Robbie Lis, MD  Multiple Vitamin (MULTIVITAMIN WITH MINERALS) TABS Take 1 tablet by mouth daily. Patient not taking: Reported on 04/14/2014 05/11/12   Regalado, Jerald Kief A, MD  naproxen (NAPROSYN) 500 MG tablet Take 1 tablet (500 mg total) by mouth 2 (two) times daily. 02/14/16   Horton, Barbette Hair, MD  naproxen (NAPROSYN) 500 MG tablet Take 1 tablet (500 mg total) by mouth 2 (two) times daily. 02/25/17   Jacqlyn Larsen,  PA-C  pantoprazole (PROTONIX) 40 MG tablet Take 1 tablet (40 mg total) by mouth daily. 06/01/15   Robbie Lis, MD  phenytoin (DILANTIN) 300 MG ER capsule Take 1 capsule (300 mg total) by mouth at bedtime. 06/01/15   Robbie Lis, MD  thiamine 100 MG tablet Take 1 tablet (100 mg total) by mouth daily. Patient not taking: Reported on 02/14/2016 06/01/15   Robbie Lis, MD    Family History Family History  Problem Relation Age of Onset  . Cancer Mother   . Cancer Brother     Social History Social History   Tobacco Use  . Smoking status: Never Smoker  . Smokeless tobacco: Never Used  Substance Use Topics  . Alcohol use: Yes    Alcohol/week: 6.0 oz    Types: 10 Cans of beer per week    Comment: 04/14/2014 "I drink all of them; I'm not drinking anything anymore"  . Drug use: No     Allergies   Penicillins   Review of Systems Review of Systems  Constitutional: Negative for fever.  HENT: Negative for sore throat.   Eyes: Negative for visual disturbance.  Respiratory: Negative for shortness of breath.   Cardiovascular: Negative for chest pain.  Gastrointestinal: Negative for abdominal pain and vomiting.  Genitourinary: Negative for dysuria and hematuria.  Musculoskeletal: Positive for back pain. Negative for neck pain.  Skin: Negative for rash.  Neurological: Negative for weakness, numbness and headaches.  Hematological: Does not bruise/bleed easily.  Psychiatric/Behavioral: The patient is nervous/anxious.      Physical Exam Updated Vital Signs BP 122/83 (BP Location: Right Arm)   Pulse 73   Temp (!) 97.5 F (36.4 C) (Oral)   Resp 14   SpO2 99%   Physical Exam  Constitutional: She appears well-developed and well-nourished. No distress.  HENT:  Head: Atraumatic.  Eyes: Conjunctivae are normal. No scleral icterus.  Neck: Neck supple. No tracheal deviation present.  Cardiovascular: Normal rate, regular rhythm and intact distal pulses. Exam reveals no gallop and  no friction rub.  No murmur heard. Pulmonary/Chest: Effort normal and breath sounds normal. No respiratory distress.  Abdominal: Soft. Normal appearance and bowel sounds are normal. She exhibits no distension. There is no tenderness.  Genitourinary:  Genitourinary Comments: No cva tenderness  Musculoskeletal: She exhibits no edema or tenderness.  CTLS spine, non tender, aligned, no step off.   Neurological: She is alert.  Odd affect ?intoxicated. Motor intact bil ext, stre 5/5. sens grossly intact.   Skin: Skin is warm and dry. No rash noted. She is not diaphoretic.  Psychiatric:  Odd affect. Anxious.   Nursing note and vitals reviewed.    ED Treatments / Results  Labs (all labs ordered are listed, but only abnormal results are displayed) Results for orders placed or performed during the hospital encounter of 06/08/17  Urinalysis, Routine w reflex microscopic  Result Value Ref Range   Color, Urine  STRAW (A) YELLOW   APPearance CLEAR CLEAR   Specific Gravity, Urine 1.003 (L) 1.005 - 1.030   pH 5.0 5.0 - 8.0   Glucose, UA NEGATIVE NEGATIVE mg/dL   Hgb urine dipstick SMALL (A) NEGATIVE   Bilirubin Urine NEGATIVE NEGATIVE   Ketones, ur NEGATIVE NEGATIVE mg/dL   Protein, ur NEGATIVE NEGATIVE mg/dL   Nitrite NEGATIVE NEGATIVE   Leukocytes, UA NEGATIVE NEGATIVE   RBC / HPF 0-5 0 - 5 RBC/hpf   WBC, UA 0-5 0 - 5 WBC/hpf   Bacteria, UA RARE (A) NONE SEEN   Squamous Epithelial / LPF 0-5 0 - 5   Mucus PRESENT    Hyaline Casts, UA PRESENT   Rapid urine drug screen (hospital performed)  Result Value Ref Range   Opiates NONE DETECTED NONE DETECTED   Cocaine NONE DETECTED NONE DETECTED   Benzodiazepines NONE DETECTED NONE DETECTED   Amphetamines NONE DETECTED NONE DETECTED   Tetrahydrocannabinol NONE DETECTED NONE DETECTED   Barbiturates NONE DETECTED NONE DETECTED    EKG None  Radiology No results found.  Procedures Procedures (including critical care time)  Medications  Ordered in ED Medications  0.9 %  sodium chloride infusion (has no administration in time range)     Initial Impression / Assessment and Plan / ED Course  I have reviewed the triage vital signs and the nursing notes.  Pertinent labs & imaging results that were available during my care of the patient were reviewed by me and considered in my medical decision making (see chart for details).  Iv ns. Labs.   Reviewed nursing notes and prior charts for additional history.   Acetaminophen po.  Recheck pt calmer, resting quietly now.   1459 labs pending - signed out to Dr Wilson Singer to check labs, reassess pt, and dispo appropriately.     Final Clinical Impressions(s) / ED Diagnoses   Final diagnoses:  None    ED Discharge Orders    None       Lajean Saver, MD 06/08/17 1601

## 2017-06-08 NOTE — ED Provider Notes (Signed)
Assumed care at change of shift with labs pending.  Patient significantly anemic.  Per review of records her baseline appears to be in the sevens though.  She has normal heart rate and normal blood pressure.  She denies any dizziness lightheadedness or shortness of breath.  Clinically this is chronic anemia.  She denies any bright red blood per rectum or melena.  Her MCV is low.  We will discharge her with a prescription for iron supplementation.  Strict return precautions were discussed.  I would like for her to follow-up with her PCP to have repeat CBC in a couple weeks.  In terms of her back pain, suspect this is unrelated to her anemia or vice versa.  She has a nonfocal neurologic examination.  Her urinalysis looks okay.  We will treat this symptomatically.   Virgel Manifold, MD 06/08/17 2147

## 2017-06-08 NOTE — ED Triage Notes (Signed)
Pt brought in by [facility] from home for right sided flank pain x1 month- pt states radiates to right arm. Per EMS EKG unremarkable. Per EMS pt endorses ETOH today- pt slurring her words. Pt is A+Ox4- able to answer questions appropriately.

## 2017-06-08 NOTE — ED Notes (Signed)
EDP informed pt asking to see doctor, has questions.

## 2017-06-08 NOTE — Discharge Instructions (Addendum)
I want you take take iron supplementation consistently. I want you to follow-up with your PCP to have repeat blood work in 1-2 weeks. Return to ED for shortness of breath, severe weakness, dizziness, etc. Drink alcohol in moderation.

## 2017-06-08 NOTE — ED Notes (Signed)
Pt in hallway, no signature pad. Expresses she has no questions at this time

## 2017-09-01 ENCOUNTER — Other Ambulatory Visit: Payer: Self-pay | Admitting: Family Medicine

## 2017-09-01 DIAGNOSIS — Z1231 Encounter for screening mammogram for malignant neoplasm of breast: Secondary | ICD-10-CM

## 2017-09-10 ENCOUNTER — Ambulatory Visit
Admission: RE | Admit: 2017-09-10 | Discharge: 2017-09-10 | Disposition: A | Discharge status: Home / Self Care | Payer: Medicaid Other | Source: Ambulatory Visit | Attending: Family Medicine | Admitting: Family Medicine

## 2017-09-10 DIAGNOSIS — Z1231 Encounter for screening mammogram for malignant neoplasm of breast: Secondary | ICD-10-CM

## 2017-11-07 ENCOUNTER — Emergency Department (HOSPITAL_COMMUNITY)
Admission: EM | Admit: 2017-11-07 | Discharge: 2017-11-07 | Discharge status: Against Medical Advice | Payer: Medicaid Other | Attending: Emergency Medicine | Admitting: Emergency Medicine

## 2017-11-07 ENCOUNTER — Other Ambulatory Visit: Payer: Self-pay

## 2017-11-07 ENCOUNTER — Encounter (HOSPITAL_COMMUNITY): Payer: Self-pay

## 2017-11-07 DIAGNOSIS — Z5321 Procedure and treatment not carried out due to patient leaving prior to being seen by health care provider: Secondary | ICD-10-CM | POA: Diagnosis not present

## 2017-11-07 DIAGNOSIS — R079 Chest pain, unspecified: Secondary | ICD-10-CM | POA: Diagnosis present

## 2017-11-07 NOTE — ED Notes (Signed)
Pt stating to phlebotomist that she does not want to continue her visit here today. Pt stating she is going to physician next week. This RN at bedside, pt unwilling to stay.

## 2017-11-07 NOTE — ED Triage Notes (Addendum)
Pt presents for evaluation of chest pain x 2 days. States also has bad arthritis and back pain to L side, this is chronic pain. Pt endorses some dizziness that started after she drank a beer this morning. Pt denies drinking daily. States she was supposed to go to PCP today but fiance is here in ED so she figured she'd get checked out.

## 2017-11-27 ENCOUNTER — Encounter (HOSPITAL_COMMUNITY): Payer: Self-pay | Admitting: *Deleted

## 2017-11-27 ENCOUNTER — Emergency Department (HOSPITAL_COMMUNITY)
Admission: EM | Admit: 2017-11-27 | Discharge: 2017-11-27 | Disposition: A | Discharge status: Home / Self Care | Payer: Medicaid Other | Attending: Emergency Medicine | Admitting: Emergency Medicine

## 2017-11-27 ENCOUNTER — Other Ambulatory Visit: Payer: Self-pay

## 2017-11-27 ENCOUNTER — Emergency Department (HOSPITAL_COMMUNITY): Payer: Medicaid Other

## 2017-11-27 DIAGNOSIS — I10 Essential (primary) hypertension: Secondary | ICD-10-CM | POA: Diagnosis not present

## 2017-11-27 DIAGNOSIS — M545 Low back pain: Secondary | ICD-10-CM | POA: Diagnosis not present

## 2017-11-27 DIAGNOSIS — R0602 Shortness of breath: Secondary | ICD-10-CM | POA: Insufficient documentation

## 2017-11-27 DIAGNOSIS — R059 Cough, unspecified: Secondary | ICD-10-CM

## 2017-11-27 DIAGNOSIS — M25511 Pain in right shoulder: Secondary | ICD-10-CM | POA: Insufficient documentation

## 2017-11-27 DIAGNOSIS — Z79899 Other long term (current) drug therapy: Secondary | ICD-10-CM | POA: Insufficient documentation

## 2017-11-27 DIAGNOSIS — R05 Cough: Secondary | ICD-10-CM | POA: Insufficient documentation

## 2017-11-27 MED ORDER — AZITHROMYCIN 250 MG PO TABS: 250.0000 mg | ORAL_TABLET | Freq: Every day | ORAL | 0 refills | Status: DC

## 2017-11-27 MED ORDER — ACETAMINOPHEN 325 MG PO TABS
650.0000 mg | ORAL_TABLET | Freq: Once | ORAL | Status: AC
  Administered 2017-11-27: 650 mg via ORAL
  Filled 2017-11-27: qty 2

## 2017-11-27 MED ORDER — BENZONATATE 100 MG PO CAPS: 200.0000 mg | ORAL_CAPSULE | Freq: Two times a day (BID) | ORAL | 0 refills | Status: DC | PRN

## 2017-11-27 NOTE — ED Triage Notes (Signed)
Pt arrives via EMS from home. Per their report, the has had productive cough 3-4 days, associated with SOB, discomfort in the right chest and right shoulder and mid back pain.  Denies fevers. 112/76, hr 80, sats 99%, resp 20, cbg 91, 98.1. Lung sounds clear.

## 2017-11-27 NOTE — ED Provider Notes (Signed)
[facility] DEPT Provider Note   CSN: [id number] Arrival date & time: 11/27/17  2043     History   Chief Complaint Chief Complaint  Patient presents with  . Cough    HPI Brandy Bishop is a 57 y.o. female.  Patient presents to the emergency department with chief complaint of cough.  She reports having cough x3 days.  She reports some associated shortness of breath.  She does have a history of asthma.  She denies any measured fever, but states that she has felt warm.  She denies having any chills.  She states that she has pain in her right shoulder and right low back.  She denies any other associated symptoms.  She has not taken any medication for her symptoms.  The history is provided by the patient. No language interpreter was used.    Past Medical History:  Diagnosis Date  . Alcoholism ([place]) 2011  . Asthma   . Chronic lower back pain   . Colitis 04/2014.   Bloody diarrhea  . Daily headache   . GIB (gastrointestinal bleeding)   . Hypertension   . Pancreatitis    Chronic pancreatitis noted on CT scan in 04/2014.  Marland Kitchen Polysubstance abuse ([place]) 2011  . Pyelonephritis 05/2012  . Seizures ([place])    "don't know what they are from" (04/14/2014)    Patient Active Problem List   Diagnosis Date Noted  . Gastroesophageal reflux disease with esophagitis   . Seizure ([place]) 05/30/2015  . Cough   . Subtherapeutic phenytoin level   . Acute upper GI bleed   . Hypomagnesemia 04/16/2014  . Hypokalemia 04/16/2014  . Thrombocytopenia ([place]) 04/16/2014  . Hepatic steatosis 04/15/2014  . Alcohol abuse 04/15/2014  . Chronic pancreatitis ([place]) 04/15/2014  . Protein-calorie malnutrition, severe ([place]) 04/15/2014  . Severe protein-calorie malnutrition ([place]) 04/15/2014  . Bleeding gastrointestinal   . Abnormal CT scan, colon   . Rectal bleeding   . Abdominal pain   . Alcoholic hepatitis without ascites   . GI bleed 04/14/2014  . Viral URI with cough  05/07/2012  . Pyelonephritis 05/07/2012  . Seizure disorder ([place]) 05/07/2012  . Asthma with bronchitis 05/07/2012  . HTN (hypertension) 05/07/2012    Past Surgical History:  Procedure Laterality Date  . ESOPHAGOGASTRODUODENOSCOPY N/A 05/31/2015   Procedure: ESOPHAGOGASTRODUODENOSCOPY (EGD);  Surgeon: Irene Shipper, MD;  Location[facility] END[facility]COPY;  Service: Endoscopy;  Laterality: N/A;  . NO PAST SURGERIES       OB History   None      Home Medications    Prior to Admission medications   Medication Sig Start Date End Date Taking? Authorizing Provider  albuterol (PROVENTIL HFA;VENTOLIN HFA) 108 (90 Base) MCG/ACT inhaler Inhale 1-2 puffs into the lungs every 6 (six) hours as needed for wheezing or shortness of breath.   Yes [provider]  atenolol (TENORMIN) 25 MG tablet Take 2 tablets (50 mg total) by mouth daily. Patient taking differently: Take 25 mg by mouth daily.  10/15/14  Yes Debroah Baller M, NP  ferrous sulfate 325 (65 FE) MG tablet Take 1 tablet (325 mg total) by mouth 2 (two) times daily with a meal. 06/08/17  Yes Virgel Manifold, MD  FLOVENT HFA 110 MCG/ACT inhaler Take 2 puffs by mouth 2 (two) times daily. 11/05/17  Yes [provider]  hydrochlorothiazide (HYDRODIURIL) 25 MG tablet Take 25 mg by mouth daily. 10/18/17  Yes [provider]  PAZEO 0.7 % SOLN Apply 1 drop  to eye every morning. 10/10/17  Yes [provider]  phenytoin (DILANTIN) 300 MG ER capsule Take 1 capsule (300 mg total) by mouth at bedtime. 06/01/15  Yes Robbie Lis, MD  RESTASIS 0.05 % ophthalmic emulsion Apply 1 drop to eye 2 (two) times daily. 10/10/17  Yes [provider]  traMADol (ULTRAM) 50 MG tablet Take 1 tablet (50 mg total) by mouth every 6 (six) hours as needed. 06/08/17  Yes Virgel Manifold, MD    Family History Family History  Problem Relation Age of Onset  . Cancer Mother   . Cancer Brother     Social History Social History   Tobacco Use  . Smoking  status: Never Smoker  . Smokeless tobacco: Never Used  Substance Use Topics  . Alcohol use: Yes    Alcohol/week: 10.0 standard drinks    Types: 10 Cans of beer per week    Comment: 04/14/2014 "I drink all of them; I'm not drinking anything anymore"  . Drug use: No     Allergies   Penicillins   Review of Systems Review of Systems  All other systems reviewed and are negative.    Physical Exam Updated Vital Signs BP 139/88   Pulse 72   Temp 98.1 F (36.7 C) (Oral)   Resp 15   SpO2 100%   Physical Exam  Constitutional: She is oriented to person, place, and time. She appears well-developed and well-nourished.  HENT:  Head: Normocephalic and atraumatic.  Eyes: Pupils are equal, round, and reactive to light. Conjunctivae and EOM are normal.  Neck: Normal range of motion. Neck supple.  Cardiovascular: Normal rate and regular rhythm. Exam reveals no gallop and no friction rub.  No murmur heard. Pulmonary/Chest: Effort normal and breath sounds normal. No respiratory distress. She has no wheezes. She has no rales. She exhibits no tenderness.  Abdominal: Soft. Bowel sounds are normal. She exhibits no distension and no mass. There is no tenderness. There is no rebound and no guarding.  Musculoskeletal: Normal range of motion. She exhibits no edema or tenderness.  Neurological: She is alert and oriented to person, place, and time.  Skin: Skin is warm and dry.  Psychiatric: She has a normal mood and affect. Her behavior is normal. Judgment and thought content normal.  Nursing note and vitals reviewed.    ED Treatments / Results  Labs (all labs ordered are listed, but only abnormal results are displayed) Labs Reviewed - No data to display  EKG EKG Interpretation  Date/Time:  Thursday November 27 2017 20:59:42 EDT Ventricular Rate:  75 PR Interval:    QRS Duration: 92 QT Interval:  410 QTC Calculation: 458 R Axis:   5 Text Interpretation:  Sinus rhythm no acute st/ts  Confirmed by Aletta Edouard [phone]) on 11/27/2017 9:57:32 PM   Radiology Dg Chest 2 View  Result Date: 11/27/2017 CLINICAL DATA:  Cough, shortness of breath and fever. Right chest pain. EXAM: CHEST - 2 VIEW COMPARISON:  Radiograph 02/14/2016 FINDINGS: The cardiomediastinal contours are normal. Mild patchy right infrahilar atelectasis. Pulmonary vasculature is normal. No consolidation, pleural effusion, or pneumothorax. No acute osseous abnormalities are seen. Chronic degenerative change of the right shoulder. IMPRESSION: Mild right infrahilar atelectasis. Electronically Signed   By: Keith Rake M.D.   On: 11/27/2017 22:23    Procedures Procedures (including critical care time)  Medications Ordered in ED Medications  acetaminophen (TYLENOL) tablet 650 mg (650 mg Oral Given 11/27/17 2151)     Initial Impression / Assessment  and Plan / ED Course  I have reviewed the triage vital signs and the nursing notes.  Pertinent labs & imaging results that were available during my care of the patient were reviewed by me and considered in my medical decision making (see chart for details).     Patient with productive cough, right low back and right shoulder pain.  Chest x-ray shows mild right infrahilar atelectasis.  She may benefit from a z-pak.  Will also give tessalon.  Final Clinical Impressions(s) / ED Diagnoses   Final diagnoses:  Cough    ED Discharge Orders         Ordered    azithromycin (ZITHROMAX) 250 MG tablet  Daily     11/27/17 2238    benzonatate (TESSALON) 100 MG capsule  2 times daily PRN     11/27/17 2238           Montine Circle, PA-C 11/27/17 2238    Orpah Greek, MD 11/28/17 [phone]

## 2017-11-27 NOTE — ED Notes (Signed)
Patient transported to X-ray 

## 2018-01-19 ENCOUNTER — Encounter (HOSPITAL_COMMUNITY): Payer: Self-pay

## 2018-01-19 ENCOUNTER — Emergency Department (HOSPITAL_COMMUNITY)
Admission: EM | Admit: 2018-01-19 | Discharge: 2018-01-19 | Disposition: A | Discharge status: Home / Self Care | Payer: Medicaid Other | Attending: Emergency Medicine | Admitting: Emergency Medicine

## 2018-01-19 ENCOUNTER — Other Ambulatory Visit: Payer: Self-pay

## 2018-01-19 ENCOUNTER — Emergency Department (HOSPITAL_COMMUNITY): Payer: Medicaid Other

## 2018-01-19 DIAGNOSIS — R0789 Other chest pain: Secondary | ICD-10-CM | POA: Diagnosis present

## 2018-01-19 DIAGNOSIS — Z79899 Other long term (current) drug therapy: Secondary | ICD-10-CM | POA: Diagnosis not present

## 2018-01-19 DIAGNOSIS — I10 Essential (primary) hypertension: Secondary | ICD-10-CM | POA: Diagnosis not present

## 2018-01-19 DIAGNOSIS — J45909 Unspecified asthma, uncomplicated: Secondary | ICD-10-CM | POA: Diagnosis not present

## 2018-01-19 LAB — BASIC METABOLIC PANEL
Anion gap: 10 (ref 5–15)
BUN: 12 mg/dL (ref 6–20)
CO2: 25 mmol/L (ref 22–32)
Calcium: 9.1 mg/dL (ref 8.9–10.3)
Chloride: 103 mmol/L (ref 98–111)
Creatinine, Ser: 1.03 mg/dL — ABNORMAL HIGH (ref 0.44–1.00)
GFR calc Af Amer: >60 mL/min (ref >60)
GLUCOSE: 99 mg/dL (ref 70–99)
POTASSIUM: 3.9 mmol/L (ref 3.5–5.1)
Sodium: 138 mmol/L (ref 135–145)

## 2018-01-19 LAB — CBC
HEMATOCRIT: 31.6 % — AB (ref 36.0–46.0)
HEMOGLOBIN: 9.5 g/dL — AB (ref 12.0–15.0)
MCH: 25.5 pg — AB (ref 26.0–34.0)
MCHC: 30.1 g/dL (ref 30.0–36.0)
MCV: 84.9 fL (ref 80.0–100.0)
Platelets: 221 10*3/uL (ref 150–400)
RBC: 3.72 MIL/uL — AB (ref 3.87–5.11)
RDW: 20.2 % — ABNORMAL HIGH (ref 11.5–15.5)
WBC: 4.6 10*3/uL (ref 4.0–10.5)
nRBC: 0 % (ref 0.0–0.2)

## 2018-01-19 LAB — I-STAT TROPONIN, ED
TROPONIN I, POC: 0 ng/mL (ref 0.00–0.08)
Troponin i, poc: 0 ng/mL (ref 0.00–0.08)

## 2018-01-19 LAB — I-STAT BETA HCG BLOOD, ED (MC, WL, AP ONLY): I-stat hCG, quantitative: <5 m[IU]/mL (ref <5)

## 2018-01-19 LAB — D-DIMER, QUANTITATIVE: D-Dimer, Quant: <0.27 ug/mL-FEU (ref 0.00–0.50)

## 2018-01-19 MED ORDER — IPRATROPIUM-ALBUTEROL 0.5-2.5 (3) MG/3ML IN SOLN
3.0000 mL | Freq: Once | RESPIRATORY_TRACT | Status: AC
  Administered 2018-01-19: 3 mL via RESPIRATORY_TRACT
  Filled 2018-01-19: qty 3

## 2018-01-19 NOTE — ED Notes (Addendum)
CP labs collected by this tech -- I-Stat on rocker in mini lab. Blue top also collected.

## 2018-01-19 NOTE — Discharge Instructions (Addendum)
Please follow up with your primary care provider within 5-7 days for re-evaluation of your symptoms. If you do not have a primary care provider, information for a healthcare clinic has been provided for you to make arrangements for follow up care. Please return to the emergency department for any new or worsening symptoms. ° °

## 2018-01-19 NOTE — ED Provider Notes (Signed)
[facility] EMERGENCY DEPARTMENT Provider Note   CSN: [id number] Arrival date & time: 01/19/18  1545     History   Chief Complaint Chief Complaint  Patient presents with  . Chest Pain    HPI Brandy Bishop is a 57 y.o. female.  HPI   Patient is a 57 year old female with a history of asthma, alcoholism, colitis, GI bleed, hypertensive, pancreatitis, polysubstance abuse, who presents the emergency department today for evaluation of chest pain that began around 730 when she woke up this morning.  States pain is located to the central aspect of her chest.  It does not radiate.  She describes it as a feeling of tightness and pressure.  Sometimes have been constant and seem to worsen throughout the day until she decided to call EMS.  She received nitro and aspirin in route.  States pain is 7/10.  States pain is not associated with exertion, shortness of breath, diaphoresis, or lightheadedness. Pain is not pleuritic. States she had a mild cough yesterday after not taking her asthma medications. thinks her sxs today may be related to her asthma as she has not taken her inhaler and she sometimes has similar sxs when she has missed doses of her medications.  Denies leg pain/swelling, hemoptysis, recent surgery/trauma, recent long travel, hormone use, personal hx of cancer, or hx of DVT/PE.   History of hyperlipidemia or diabetes.  Denies history of smoking.  Denies any early family history of heart disease.  Past Medical History:  Diagnosis Date  . Alcoholism ([place]) 2011  . Asthma   . Chronic lower back pain   . Colitis 04/2014.   Bloody diarrhea  . Daily headache   . GIB (gastrointestinal bleeding)   . Hypertension   . Pancreatitis    Chronic pancreatitis noted on CT scan in 04/2014.  Marland Kitchen Polysubstance abuse ([place]) 2011  . Pyelonephritis 05/2012  . Seizures ([place])    "don't know what they are from" (04/14/2014)    Patient Active Problem List   Diagnosis Date Noted    . Gastroesophageal reflux disease with esophagitis   . Seizure ([place]) 05/30/2015  . Cough   . Subtherapeutic phenytoin level   . Acute upper GI bleed   . Hypomagnesemia 04/16/2014  . Hypokalemia 04/16/2014  . Thrombocytopenia ([place]) 04/16/2014  . Hepatic steatosis 04/15/2014  . Alcohol abuse 04/15/2014  . Chronic pancreatitis ([place]) 04/15/2014  . Protein-calorie malnutrition, severe ([place]) 04/15/2014  . Severe protein-calorie malnutrition ([place]) 04/15/2014  . Bleeding gastrointestinal   . Abnormal CT scan, colon   . Rectal bleeding   . Abdominal pain   . Alcoholic hepatitis without ascites   . GI bleed 04/14/2014  . Viral URI with cough 05/07/2012  . Pyelonephritis 05/07/2012  . Seizure disorder ([place]) 05/07/2012  . Asthma with bronchitis 05/07/2012  . HTN (hypertension) 05/07/2012    Past Surgical History:  Procedure Laterality Date  . ESOPHAGOGASTRODUODENOSCOPY N/A 05/31/2015   Procedure: ESOPHAGOGASTRODUODENOSCOPY (EGD);  Surgeon: Irene Shipper, MD;  Location: [facility] ENDOSCOPY;  Service: Endoscopy;  Laterality: N/A;  . NO PAST SURGERIES       OB History   No obstetric history on file.      Home Medications    Prior to Admission medications   Medication Sig Start Date End Date Taking? Authorizing Provider  albuterol (PROVENTIL HFA;VENTOLIN HFA) 108 (90 Base) MCG/ACT inhaler Inhale 1-2 puffs into the lungs every 6 (six) hours as needed for wheezing or shortness of breath.  [provider]  atenolol (TENORMIN) 25 MG tablet Take 2 tablets (50 mg total) by mouth daily. Patient taking differently: Take 25 mg by mouth daily.  10/15/14   [name], NP  azithromycin (ZITHROMAX) 250 MG tablet Take 1 tablet (250 mg total) by mouth daily. Take first 2 tablets together, then 1 every day until finished. 11/27/17   Montine Circle, PA-C  benzonatate (TESSALON) 100 MG capsule Take 2 capsules (200 mg total) by mouth 2 (two) times daily as needed for cough. 11/27/17   Montine Circle, PA-C  ferrous sulfate 325 (65 FE) MG tablet Take 1 tablet (325 mg total) by mouth 2 (two) times daily with a meal. 06/08/17   Virgel Manifold, MD  FLOVENT HFA 110 MCG/ACT inhaler Take 2 puffs by mouth 2 (two) times daily. 11/05/17   [provider]  hydrochlorothiazide (HYDRODIURIL) 25 MG tablet Take 25 mg by mouth daily. 10/18/17   [provider]  PAZEO 0.7 % SOLN Apply 1 drop to eye every morning. 10/10/17   [provider]  phenytoin (DILANTIN) 300 MG ER capsule Take 1 capsule (300 mg total) by mouth at bedtime. 06/01/15   Robbie Lis, MD  RESTASIS 0.05 % ophthalmic emulsion Apply 1 drop to eye 2 (two) times daily. 10/10/17   [provider]  traMADol (ULTRAM) 50 MG tablet Take 1 tablet (50 mg total) by mouth every 6 (six) hours as needed. 06/08/17   Virgel Manifold, MD    Family History Family History  Problem Relation Age of Onset  . Cancer Mother   . Cancer Brother     Social History Social History   Tobacco Use  . Smoking status: Never Smoker  . Smokeless tobacco: Never Used  Substance Use Topics  . Alcohol use: Yes    Alcohol/week: 10.0 standard drinks    Types: 10 Cans of beer per week    Comment: 04/14/2014 "I drink all of them; I'm not drinking anything anymore"  . Drug use: No     Allergies   Penicillins   Review of Systems Review of Systems  Constitutional: Negative for diaphoresis and fever.  HENT: Negative for ear pain and sore throat.   Eyes: Negative for pain and visual disturbance.  Respiratory: Positive for cough. Negative for shortness of breath and wheezing.   Cardiovascular: Positive for chest pain. Negative for leg swelling.  Gastrointestinal: Negative for abdominal pain, constipation, diarrhea, nausea and vomiting.  Genitourinary: Negative for dysuria and hematuria.  Musculoskeletal: Positive for back pain (chronic).  Skin: Negative for color change and rash.  Neurological: Negative for dizziness,  light-headedness and headaches.  All other systems reviewed and are negative.   Physical Exam Updated Vital Signs BP (!) 155/89   Pulse 77   Temp 98.9 F (37.2 C) (Oral)   Resp 18   SpO2 100%   Physical Exam Vitals signs and nursing note reviewed.  Constitutional:      General: She is not in acute distress.    Appearance: She is well-developed.  HENT:     Head: Normocephalic and atraumatic.  Eyes:     Conjunctiva/sclera: Conjunctivae normal.  Neck:     Musculoskeletal: Neck supple.  Cardiovascular:     Rate and Rhythm: Normal rate and regular rhythm.     Pulses:          Radial pulses are 2+ on the right side and 2+ on the left side.       Dorsalis pedis pulses are  2+ on the right side and 2+ on the left side.     Heart sounds: Normal heart sounds. No murmur.  Pulmonary:     Effort: Pulmonary effort is normal. No respiratory distress.     Breath sounds: Normal breath sounds. No decreased breath sounds, wheezing, rhonchi or rales.     Comments: Mild mid to left sided chest pain Chest:     Chest wall: No crepitus.  Abdominal:     General: Bowel sounds are normal.     Palpations: Abdomen is soft.     Tenderness: There is no abdominal tenderness.  Musculoskeletal:     Right lower leg: She exhibits no tenderness. No edema.     Left lower leg: She exhibits no tenderness. No edema.  Skin:    General: Skin is warm and dry.     Capillary Refill: Capillary refill takes less than 2 seconds.  Neurological:     Mental Status: She is alert.  Psychiatric:     Comments: Flat affect      ED Treatments / Results  Labs (all labs ordered are listed, but only abnormal results are displayed) Labs Reviewed  BASIC METABOLIC PANEL - Abnormal; Notable for the following components:      Result Value   Creatinine, Ser 1.03 (*)    All other components within normal limits  CBC - Abnormal; Notable for the following components:   RBC 3.72 (*)    Hemoglobin 9.5 (*)    HCT 31.6 (*)      MCH 25.5 (*)    RDW 20.2 (*)    All other components within normal limits  D-DIMER, QUANTITATIVE (NOT AT [facility])  I-STAT TROPONIN, ED  I-STAT BETA HCG BLOOD, ED ([facility], [facility], [facility] ONLY)  I-STAT TROPONIN, ED    EKG None  Radiology Dg Chest 2 View  Result Date: 01/19/2018 CLINICAL DATA:  Chest pain EXAM: CHEST - 2 VIEW COMPARISON:  11/27/2017 FINDINGS: The heart size and mediastinal contours are within normal limits. Both lungs are clear. The visualized skeletal structures are unremarkable. IMPRESSION: No active cardiopulmonary disease. Electronically Signed   By: Ulyses Jarred M.D.   On: 01/19/2018 18:02    Procedures Procedures (including critical care time)  Medications Ordered in ED Medications  ipratropium-albuterol (DUONEB) 0.5-2.5 (3) MG/3ML nebulizer solution 3 mL (3 mLs Nebulization Given 01/19/18 2026)     Initial Impression / Assessment and Plan / ED Course  I have reviewed the triage vital signs and the nursing notes.  Pertinent labs & imaging results that were available during my care of the patient were reviewed by me and considered in my medical decision making (see chart for details).     Final Clinical Impressions(s) / ED Diagnoses   Final diagnoses:  Atypical chest pain   Patient complaining of mid to left-sided chest pain that began when she woke up this morning.  Has been gradually worse throughout the day.  Feels similar to chest tightness and pressure.  She questions whether or not her symptoms are because she did not take her asthma medications today.  Pain is not pleuritic.  Is not associated with exertion.  It does not radiate.  It improved with aspirin and nitro given prior to arrival.  Exam is benign.  She does have some mild chest wall tenderness.  No lower extremity edema or calf pain.  Lab work notable for CBC with anemia that appears chronic improvement prior.  No leukocytosis. BMP is benign. Delta troponins negative Ddimer  negative  CXR normal.  EKG with NSR.   Symptoms seem atypical for ACS, heart score is 2 and she is low risk.  Doubt AAA as no widened mediastinum on chest x-ray.  No evidence of pneumonia or pneumothorax.  Duo neb administered given pt report that she thinks sxs may be secondary to not having her asthma medications to day. On reeval she states her cp resolved after the duo neb. She is feeling much better. Doubt acute cardiac or pulmonary etiology at this time that would require further workup or admission to the hospital. Will discharge home with close f/u with pcp and advise to return of worse. pt is comfortable with the plan.  She voices understanding and is in agreement.  All questions answered.  ED Discharge Orders    None       Bishop Dublin 01/19/18 [place], MD 01/19/18 [phone]

## 2018-01-19 NOTE — ED Notes (Signed)
Gave pt Ginger-Ale  Tolerating well at this time.

## 2018-01-19 NOTE — ED Triage Notes (Signed)
Pt arrives via [facility] from PCP office with c/o central, non radiating chest pressure since 0730 this morning.  [facility] gave 324mg  ASA, 1 Nitro.

## 2018-01-19 NOTE — ED Notes (Signed)
Discharge instructions discussed with Pt. Pt verbalized understanding. Pt stable and ambulatory.    

## 2018-02-09 ENCOUNTER — Emergency Department (HOSPITAL_COMMUNITY)
Admission: EM | Admit: 2018-02-09 | Discharge: 2018-02-09 | Disposition: A | Discharge status: Home / Self Care | Payer: Medicaid Other | Attending: Emergency Medicine | Admitting: Emergency Medicine

## 2018-02-09 ENCOUNTER — Emergency Department (HOSPITAL_COMMUNITY): Payer: Medicaid Other

## 2018-02-09 ENCOUNTER — Encounter (HOSPITAL_COMMUNITY): Payer: Self-pay | Admitting: *Deleted

## 2018-02-09 DIAGNOSIS — J45909 Unspecified asthma, uncomplicated: Secondary | ICD-10-CM | POA: Insufficient documentation

## 2018-02-09 DIAGNOSIS — R030 Elevated blood-pressure reading, without diagnosis of hypertension: Secondary | ICD-10-CM

## 2018-02-09 DIAGNOSIS — W19XXXA Unspecified fall, initial encounter: Secondary | ICD-10-CM | POA: Insufficient documentation

## 2018-02-09 DIAGNOSIS — Y929 Unspecified place or not applicable: Secondary | ICD-10-CM | POA: Insufficient documentation

## 2018-02-09 DIAGNOSIS — I10 Essential (primary) hypertension: Secondary | ICD-10-CM | POA: Diagnosis not present

## 2018-02-09 DIAGNOSIS — Y939 Activity, unspecified: Secondary | ICD-10-CM | POA: Insufficient documentation

## 2018-02-09 DIAGNOSIS — Y999 Unspecified external cause status: Secondary | ICD-10-CM | POA: Diagnosis not present

## 2018-02-09 DIAGNOSIS — S20211A Contusion of right front wall of thorax, initial encounter: Secondary | ICD-10-CM | POA: Diagnosis present

## 2018-02-09 NOTE — ED Provider Notes (Signed)
[facility] EMERGENCY DEPARTMENT Provider Note   CSN: [id number] Arrival date & time: 02/09/18  1307     History   Chief Complaint Chief Complaint  Patient presents with  . Fall    HPI Brandy Bishop is a 58 y.o. female.  HPI   Patient is a 58 year old female with a past medical history of alcoholism, asthma, GI bleeding, chronic low back pain presenting for fall and breast pain.  She reports that 2 days ago, she fell onto her right side and she has had some pain overlying her right breast.  She denies any bruising over the chest wall, but does have some bruising on the dorsum of her right hand.  She denies any splinting while breathing, shortness of breath, or chest pain when not palpating the chest wall.  Patient reports that this fall occurred because she slipped on some wet pavement.  No head or neck injury in this fall. She denies syncope.  Patient has been taking Tylenol for her symptoms.  Past Medical History:  Diagnosis Date  . Alcoholism ([place]) 2011  . Asthma   . Chronic lower back pain   . Colitis 04/2014.   Bloody diarrhea  . Daily headache   . GIB (gastrointestinal bleeding)   . Hypertension   . Pancreatitis    Chronic pancreatitis noted on CT scan in 04/2014.  Marland Kitchen Polysubstance abuse ([place]) 2011  . Pyelonephritis 05/2012  . Seizures ([place])    "don't know what they are from" (04/14/2014)    Patient Active Problem List   Diagnosis Date Noted  . Gastroesophageal reflux disease with esophagitis   . Seizure ([place]) 05/30/2015  . Cough   . Subtherapeutic phenytoin level   . Acute upper GI bleed   . Hypomagnesemia 04/16/2014  . Hypokalemia 04/16/2014  . Thrombocytopenia ([place]) 04/16/2014  . Hepatic steatosis 04/15/2014  . Alcohol abuse 04/15/2014  . Chronic pancreatitis ([place]) 04/15/2014  . Protein-calorie malnutrition, severe ([place]) 04/15/2014  . Severe protein-calorie malnutrition ([place]) 04/15/2014  . Bleeding gastrointestinal   . Abnormal CT  scan, colon   . Rectal bleeding   . Abdominal pain   . Alcoholic hepatitis without ascites   . GI bleed 04/14/2014  . Viral URI with cough 05/07/2012  . Pyelonephritis 05/07/2012  . Seizure disorder ([place]) 05/07/2012  . Asthma with bronchitis 05/07/2012  . HTN (hypertension) 05/07/2012    Past Surgical History:  Procedure Laterality Date  . ESOPHAGOGASTRODUODENOSCOPY N/A 05/31/2015   Procedure: ESOPHAGOGASTRODUODENOSCOPY (EGD);  Surgeon: Irene Shipper, MD;  Location: [facility] ENDOSCOPY;  Service: Endoscopy;  Laterality: N/A;  . NO PAST SURGERIES       OB History   No obstetric history on file.      Home Medications    Prior to Admission medications   Medication Sig Start Date End Date Taking? Authorizing Provider  albuterol (PROVENTIL HFA;VENTOLIN HFA) 108 (90 Base) MCG/ACT inhaler Inhale 1-2 puffs into the lungs every 6 (six) hours as needed for wheezing or shortness of breath.    [provider]  atenolol (TENORMIN) 25 MG tablet Take 2 tablets (50 mg total) by mouth daily. Patient taking differently: Take 25 mg by mouth daily.  10/15/14   [name], NP  azithromycin (ZITHROMAX) 250 MG tablet Take 1 tablet (250 mg total) by mouth daily. Take first 2 tablets together, then 1 every day until finished. 11/27/17   Montine Circle, PA-C  benzonatate (TESSALON) 100 MG capsule Take 2 capsules (200 mg total) by  mouth 2 (two) times daily as needed for cough. 11/27/17   Montine Circle, PA-C  ferrous sulfate 325 (65 FE) MG tablet Take 1 tablet (325 mg total) by mouth 2 (two) times daily with a meal. 06/08/17   Virgel Manifold, MD  FLOVENT HFA 110 MCG/ACT inhaler Take 2 puffs by mouth 2 (two) times daily. 11/05/17   [provider]  hydrochlorothiazide (HYDRODIURIL) 25 MG tablet Take 25 mg by mouth daily. 10/18/17   [provider]  PAZEO 0.7 % SOLN Apply 1 drop to eye every morning. 10/10/17   [provider]  phenytoin (DILANTIN) 300 MG ER capsule Take 1 capsule  (300 mg total) by mouth at bedtime. 06/01/15   Robbie Lis, MD  RESTASIS 0.05 % ophthalmic emulsion Apply 1 drop to eye 2 (two) times daily. 10/10/17   [provider]  traMADol (ULTRAM) 50 MG tablet Take 1 tablet (50 mg total) by mouth every 6 (six) hours as needed. 06/08/17   Virgel Manifold, MD    Family History Family History  Problem Relation Age of Onset  . Cancer Mother   . Cancer Brother     Social History Social History   Tobacco Use  . Smoking status: Never Smoker  . Smokeless tobacco: Never Used  Substance Use Topics  . Alcohol use: Yes    Alcohol/week: 10.0 standard drinks    Types: 10 Cans of beer per week    Comment: 04/14/2014 "I drink all of them; I'm not drinking anything anymore"  . Drug use: No     Allergies   Penicillins   Review of Systems Review of Systems  Constitutional: Negative for chills and fever.  Respiratory: Negative for shortness of breath.   Cardiovascular: Negative for chest pain.  Musculoskeletal: Positive for arthralgias and myalgias. Negative for joint swelling.  Skin: Negative for color change.  Neurological: Negative for weakness and numbness.     Physical Exam Updated Vital Signs BP (!) 149/93 (BP Location: Right Arm)   Pulse 79   Temp 98 F (36.7 C) (Oral)   Resp 18   SpO2 99%   Physical Exam Vitals signs and nursing note reviewed.  Constitutional:      General: She is not in acute distress.    Appearance: Normal appearance. She is well-developed. She is not ill-appearing or diaphoretic.     Comments: Sitting comfortably in bed.  HENT:     Head: Normocephalic and atraumatic.  Eyes:     General:        Right eye: No discharge.        Left eye: No discharge.     Conjunctiva/sclera: Conjunctivae normal.     Comments: EOMs normal to gross examination.  Neck:     Musculoskeletal: Normal range of motion.  Cardiovascular:     Rate and Rhythm: Normal rate and regular rhythm.     Comments: Intact, 2+ radial  pulse Pulmonary:     Effort: Pulmonary effort is normal.     Breath sounds: Normal breath sounds.     Comments: Patient converses comfortably without audible wheeze or stridor. Abdominal:     General: There is no distension.  Musculoskeletal: Normal range of motion.     Comments: Examination the chest wall performed with nurse tech chaperone present.  Patient has no ecchymosis of the chest wall.  Patient denies any pain on palpation of the sternum or bilateral anterior posterior thorax.  No abdominal ecchymosis or tenderness to palpation.  Right wrist with  ecchymosis overlying the dorsum of the hand, but there is no tenderness palpation overlying the metacarpals, or phalanges.  No distal radius tenderness to palpation.  Skin:    General: Skin is warm and dry.  Neurological:     Mental Status: She is alert.     Comments: Cranial nerves intact to gross observation. Patient moves extremities without difficulty.  Psychiatric:        Behavior: Behavior normal.        Thought Content: Thought content normal.        Judgment: Judgment normal.      ED Treatments / Results  Labs (all labs ordered are listed, but only abnormal results are displayed) Labs Reviewed - No data to display  EKG None   ED ECG REPORT   Date: 02/09/2018  Rate: 103  Rhythm: normal sinus rhythm  QRS Axis: normal  Intervals: normal  ST/T Wave abnormalities: normal  Conduction Disutrbances:none  Narrative Interpretation:   Old EKG Reviewed: unchanged  I have personally reviewed the EKG tracing and agree with the computerized printout as noted. Reviewed by Dr. Gerlene Fee who states EKG is unchanged.    Radiology Dg Chest 2 View  Result Date: 02/09/2018 CLINICAL DATA:  Cough and breast pain after fall EXAM: CHEST - 2 VIEW COMPARISON:  01/19/2018 FINDINGS: The heart size and mediastinal contours are within normal limits. Both lungs are clear. The visualized skeletal structures are unremarkable. IMPRESSION:  No active cardiopulmonary disease. Electronically Signed   By: Ulyses Jarred M.D.   On: 02/09/2018 13:53    Procedures Procedures (including critical care time)  Medications Ordered in ED Medications - No data to display   Initial Impression / Assessment and Plan / ED Course  I have reviewed the triage vital signs and the nursing notes.  Pertinent labs & imaging results that were available during my care of the patient were reviewed by me and considered in my medical decision making (see chart for details).     Patient is well-appearing and in no acute distress.  There is no injury apparent on the chest wall nor tenderness to palpation overlying the chest wall or patient was injured.  Patient reports slipping on wet pavement, they do not appear to be any signs of syncope leading to this fall.  No head or neck trauma.  Patient is not on blood thinners.  EKG unchanged without any evidence of acute ischemia, infection, or arrhythmia.  Chest x-ray clear of any cardiopulmonary or traumatic abnormality.  Patient was given return precautions for any worsening symptoms or splinting with breathing.  Patient is in understanding and agrees with plan of care.  Final Clinical Impressions(s) / ED Diagnoses   Final diagnoses:  Contusion of right chest wall, initial encounter  Elevated blood pressure reading without diagnosis of hypertension    ED Discharge Orders    None       Tamala Julian 02/09/18 1446    Maudie Flakes, MD 02/09/18 1504

## 2018-02-09 NOTE — Discharge Instructions (Addendum)
Please see the information and instructions below regarding your visit.  Your diagnoses today include:  1. Contusion of right chest wall, initial encounter   2. Elevated blood pressure reading without diagnosis of hypertension     Tests performed today include: See side panel of your discharge paperwork for testing performed today. Vital signs are listed at the bottom of these instructions.   Chest xray is normal.   Medications prescribed:    Take any prescribed medications only as prescribed, and any over the counter medications only as directed on the packaging.  Home care instructions:  Please follow any educational materials contained in this packet.   Follow-up instructions: Please follow-up with your primary care provider within 5-7 days for further evaluation of your symptoms if they are not completely improved.   Return instructions:  Please return to the Emergency Department if you experience worsening symptoms.  Please return to the emergency department if you develop any worsening symptoms, difficulty breathing or any significant worsening pain with breathing. Please return if you have any other emergent concerns.  Additional Information:   Your vital signs today were: BP (!) 149/93 (BP Location: Right Arm)    Pulse 79    Temp 98 F (36.7 C) (Oral)    Resp 18    SpO2 99%  If your blood pressure (BP) was elevated on multiple readings during this visit above 130 for the top number or above 80 for the bottom number, please have this repeated by your primary care provider within one month. --------------  Thank you for allowing [place] to participate in your care today.

## 2018-02-09 NOTE — ED Notes (Signed)
Pt verbalized understanding of discharge instructions and denies any further questions at this time.   

## 2018-02-09 NOTE — ED Triage Notes (Signed)
Pt in stating she fell yesterday and landed on her breasts, c/o pain to that area and pain when coughing, no distress noted

## 2018-07-29 ENCOUNTER — Other Ambulatory Visit: Payer: Self-pay

## 2018-07-29 ENCOUNTER — Emergency Department (HOSPITAL_COMMUNITY)
Admission: EM | Admit: 2018-07-29 | Discharge: 2018-07-29 | Disposition: A | Discharge status: Home / Self Care | Payer: Medicaid Other | Attending: Emergency Medicine | Admitting: Emergency Medicine

## 2018-07-29 DIAGNOSIS — I10 Essential (primary) hypertension: Secondary | ICD-10-CM | POA: Insufficient documentation

## 2018-07-29 DIAGNOSIS — G40909 Epilepsy, unspecified, not intractable, without status epilepticus: Secondary | ICD-10-CM | POA: Diagnosis not present

## 2018-07-29 DIAGNOSIS — K625 Hemorrhage of anus and rectum: Secondary | ICD-10-CM

## 2018-07-29 DIAGNOSIS — J45909 Unspecified asthma, uncomplicated: Secondary | ICD-10-CM | POA: Insufficient documentation

## 2018-07-29 DIAGNOSIS — R079 Chest pain, unspecified: Secondary | ICD-10-CM | POA: Diagnosis present

## 2018-07-29 DIAGNOSIS — Z79899 Other long term (current) drug therapy: Secondary | ICD-10-CM | POA: Diagnosis not present

## 2018-07-29 LAB — URINALYSIS, ROUTINE W REFLEX MICROSCOPIC
Bilirubin Urine: NEGATIVE
Glucose, UA: NEGATIVE mg/dL
Ketones, ur: NEGATIVE mg/dL
Leukocytes,Ua: NEGATIVE
Nitrite: NEGATIVE
Protein, ur: NEGATIVE mg/dL
Specific Gravity, Urine: 1.009 (ref 1.005–1.030)
pH: 5 (ref 5.0–8.0)

## 2018-07-29 LAB — CBC WITH DIFFERENTIAL/PLATELET
Abs Immature Granulocytes: 0.02 10*3/uL (ref 0.00–0.07)
Basophils Absolute: 0.1 10*3/uL (ref 0.0–0.1)
Basophils Relative: 2 %
Eosinophils Absolute: 0.1 10*3/uL (ref 0.0–0.5)
Eosinophils Relative: 4 %
HCT: 27.7 % — ABNORMAL LOW (ref 36.0–46.0)
Hemoglobin: 9.5 g/dL — ABNORMAL LOW (ref 12.0–15.0)
Immature Granulocytes: 1 %
Lymphocytes Relative: 41 %
Lymphs Abs: 1.5 10*3/uL (ref 0.7–4.0)
MCH: 32.5 pg (ref 26.0–34.0)
MCHC: 34.3 g/dL (ref 30.0–36.0)
MCV: 94.9 fL (ref 80.0–100.0)
Monocytes Absolute: 0.5 10*3/uL (ref 0.1–1.0)
Monocytes Relative: 13 %
Neutro Abs: 1.4 10*3/uL — ABNORMAL LOW (ref 1.7–7.7)
Neutrophils Relative %: 39 %
Platelets: UNDETERMINED 10*3/uL (ref 150–400)
RBC: 2.92 MIL/uL — ABNORMAL LOW (ref 3.87–5.11)
RDW: 16 % — ABNORMAL HIGH (ref 11.5–15.5)
WBC: 3.6 10*3/uL — ABNORMAL LOW (ref 4.0–10.5)
nRBC: 0 % (ref 0.0–0.2)

## 2018-07-29 LAB — BASIC METABOLIC PANEL
Anion gap: 12 (ref 5–15)
BUN: 5 mg/dL — ABNORMAL LOW (ref 6–20)
CO2: 24 mmol/L (ref 22–32)
Calcium: 8.3 mg/dL — ABNORMAL LOW (ref 8.9–10.3)
Chloride: 104 mmol/L (ref 98–111)
Creatinine, Ser: 0.51 mg/dL (ref 0.44–1.00)
GFR calc Af Amer: >60 mL/min (ref >60)
GFR calc non Af Amer: >60 mL/min (ref >60)
Glucose, Bld: 72 mg/dL (ref 70–99)
Potassium: 3.6 mmol/L (ref 3.5–5.1)
Sodium: 140 mmol/L (ref 135–145)

## 2018-07-29 LAB — POC OCCULT BLOOD, ED: Fecal Occult Bld: POSITIVE — AB

## 2018-07-29 MED ORDER — MORPHINE SULFATE (PF) 4 MG/ML IV SOLN
4.0000 mg | Freq: Once | INTRAVENOUS | Status: AC
  Administered 2018-07-29: 4 mg via INTRAVENOUS
  Filled 2018-07-29 (×2): qty 1

## 2018-07-29 NOTE — Discharge Instructions (Addendum)
Your blood levels today are stable. I do not think the amount of bleeding you are having is serious and I think you are safe for discharge at this time. Follow-up with GI. You have seen Dr Henrene Pastor previously for upper endoscopy. You will likely need a colonoscopy to evaluate this further. Return to the ER if you feel like the bleeding is significantly worsening in the mean time.

## 2018-07-29 NOTE — ED Notes (Signed)
Pt given discharge instructions, follow up information and the opportunity to ask questions. Pt verbalized understanding. Pt discharged without incident.

## 2018-07-29 NOTE — ED Triage Notes (Signed)
Pt in with c/o central cp (constant per pt), bloody stools x 3 days and back pain.

## 2018-07-29 NOTE — ED Triage Notes (Signed)
Pt reporting central chest pain that has been going on for a few weeks. Pt also reporting lower back pain and blood in her stools x3 days. Pt denying any chest pain at present time. Denies any shortness of breath. Pt is alert and oriented x4.

## 2018-07-29 NOTE — ED Notes (Signed)
Stuck ms.Nelon x2 unable to get blood return .reported to her nurse

## 2018-08-02 NOTE — ED Provider Notes (Signed)
[facility] EMERGENCY DEPARTMENT Provider Note   CSN: [id number] Arrival date & time: 07/29/18  6644     History   Chief Complaint Chief Complaint  Patient presents with  . Chest Pain    HPI Brandy Bishop is a 58 y.o. female.     HPI   58 year old female with multiple complaints.  Chest pain.  Pain is in the center of her chest.  Has been constant since getting a couple weeks ago.  No appreciable exacerbating relieving factors.  And actually currently feels better than it has been.  No acute respiratory complaints.  No fevers or chills.  No unusual leg pain or swelling.  She also notes that she has been having some bright red blood mixed in with her stool over the past 2 days.  Past Medical History:  Diagnosis Date  . Alcoholism ([place]) 2011  . Asthma   . Chronic lower back pain   . Colitis 04/2014.   Bloody diarrhea  . Daily headache   . GIB (gastrointestinal bleeding)   . Hypertension   . Pancreatitis    Chronic pancreatitis noted on CT scan in 04/2014.  Marland Kitchen Polysubstance abuse ([place]) 2011  . Pyelonephritis 05/2012  . Seizures ([place])    "don't know what they are from" (04/14/2014)    Patient Active Problem List   Diagnosis Date Noted  . Gastroesophageal reflux disease with esophagitis   . Seizure ([place]) 05/30/2015  . Cough   . Subtherapeutic phenytoin level   . Acute upper GI bleed   . Hypomagnesemia 04/16/2014  . Hypokalemia 04/16/2014  . Thrombocytopenia ([place]) 04/16/2014  . Hepatic steatosis 04/15/2014  . Alcohol abuse 04/15/2014  . Chronic pancreatitis ([place]) 04/15/2014  . Protein-calorie malnutrition, severe ([place]) 04/15/2014  . Severe protein-calorie malnutrition ([place]) 04/15/2014  . Bleeding gastrointestinal   . Abnormal CT scan, colon   . Rectal bleeding   . Abdominal pain   . Alcoholic hepatitis without ascites   . GI bleed 04/14/2014  . Viral URI with cough 05/07/2012  . Pyelonephritis 05/07/2012  . Seizure disorder ([place]) 05/07/2012   . Asthma with bronchitis 05/07/2012  . HTN (hypertension) 05/07/2012    Past Surgical History:  Procedure Laterality Date  . ESOPHAGOGASTRODUODENOSCOPY N/A 05/31/2015   Procedure: ESOPHAGOGASTRODUODENOSCOPY (EGD);  Surgeon: Irene Shipper, MD;  [facility] ENDOSCOPY;  Service: Endoscopy;  Laterality: N/A;  . NO PAST SURGERIES       OB History   No obstetric history on file.      Home Medications    Prior to Admission medications   Medication Sig Start Date End Date Taking? Authorizing Provider  albuterol (PROVENTIL HFA;VENTOLIN HFA) 108 (90 Base) MCG/ACT inhaler Inhale 1-2 puffs into the lungs every 6 (six) hours as needed for wheezing or shortness of breath.    [provider]  atenolol (TENORMIN) 25 MG tablet Take 2 tablets (50 mg total) by mouth daily. Patient taking differently: Take 25 mg by mouth daily.  10/15/14   [name], NP  azithromycin (ZITHROMAX) 250 MG tablet Take 1 tablet (250 mg total) by mouth daily. Take first 2 tablets together, then 1 every day until finished. 11/27/17   Montine Circle, PA-C  benzonatate (TESSALON) 100 MG capsule Take 2 capsules (200 mg total) by mouth 2 (two) times daily as needed for cough. 11/27/17   Montine Circle, PA-C  ferrous sulfate 325 (65 FE) MG tablet Take 1 tablet (325 mg total) by mouth 2 (two) times daily with  a meal. 06/08/17   Virgel Manifold, MD  FLOVENT HFA 110 MCG/ACT inhaler Take 2 puffs by mouth 2 (two) times daily. 11/05/17   [provider]  hydrochlorothiazide (HYDRODIURIL) 25 MG tablet Take 25 mg by mouth daily. 10/18/17   [provider]  PAZEO 0.7 % SOLN Apply 1 drop to eye every morning. 10/10/17   [provider]  phenytoin (DILANTIN) 300 MG ER capsule Take 1 capsule (300 mg total) by mouth at bedtime. 06/01/15   Robbie Lis, MD  RESTASIS 0.05 % ophthalmic emulsion Apply 1 drop to eye 2 (two) times daily. 10/10/17   [provider]  traMADol (ULTRAM) 50 MG tablet Take 1  tablet (50 mg total) by mouth every 6 (six) hours as needed. 06/08/17   Virgel Manifold, MD    Family History Family History  Problem Relation Age of Onset  . Cancer Mother   . Cancer Brother     Social History Social History   Tobacco Use  . Smoking status: Never Smoker  . Smokeless tobacco: Never Used  Substance Use Topics  . Alcohol use: Yes    Alcohol/week: 10.0 standard drinks    Types: 10 Cans of beer per week    Comment: 04/14/2014 "I drink all of them; I'm not drinking anything anymore"  . Drug use: No     Allergies   Penicillins   Review of Systems Review of Systems  All systems reviewed and negative, other than as noted in HPI.  Physical Exam Updated Vital Signs BP (!) 159/100 (BP Location: Left Arm)   Pulse 94   Temp 98.3 F (36.8 C) (Oral)   Resp 12   Ht 5\' 3"  (1.6 m)   Wt 45.4 kg   SpO2 99%   BMI 17.71 kg/m   Physical Exam Vitals signs and nursing note reviewed.  Constitutional:      General: She is not in acute distress.    Appearance: She is well-developed.  HENT:     Head: Normocephalic and atraumatic.  Eyes:     General:        Right eye: No discharge.        Left eye: No discharge.     Conjunctiva/sclera: Conjunctivae normal.  Neck:     Musculoskeletal: Neck supple.  Cardiovascular:     Rate and Rhythm: Normal rate and regular rhythm.     Heart sounds: Normal heart sounds. No murmur. No friction rub. No gallop.   Pulmonary:     Effort: Pulmonary effort is normal. No respiratory distress.     Breath sounds: Normal breath sounds.  Abdominal:     General: There is no distension.     Palpations: Abdomen is soft.     Tenderness: There is no abdominal tenderness.  Musculoskeletal:        General: No tenderness.  Skin:    General: Skin is warm and dry.  Neurological:     Mental Status: She is alert.  Psychiatric:        Behavior: Behavior normal.        Thought Content: Thought content normal.      ED Treatments / Results   Labs (all labs ordered are listed, but only abnormal results are displayed) Labs Reviewed  CBC WITH DIFFERENTIAL/PLATELET - Abnormal; Notable for the following components:      Result Value   WBC 3.6 (*)    RBC 2.92 (*)    Hemoglobin 9.5 (*)    HCT 27.7 (*)  RDW 16.0 (*)    Neutro Abs 1.4 (*)    All other components within normal limits  BASIC METABOLIC PANEL - Abnormal; Notable for the following components:   BUN 5 (*)    Calcium 8.3 (*)    All other components within normal limits  URINALYSIS, ROUTINE W REFLEX MICROSCOPIC - Abnormal; Notable for the following components:   Hgb urine dipstick MODERATE (*)    Bacteria, UA RARE (*)    All other components within normal limits  POC OCCULT BLOOD, ED - Abnormal; Notable for the following components:   Fecal Occult Bld POSITIVE (*)    All other components within normal limits    EKG EKG Interpretation  Date/Time:  Wednesday July 29 2018 08:41:41 EDT Ventricular Rate:  89 PR Interval:    QRS Duration: 86 QT Interval:  352 QTC Calculation: 429 R Axis:   37 Text Interpretation:  Sinus rhythm Tall R wave in V2, consider RVH or PMI Borderline abnrm T, anterolateral leads Confirmed by Virgel Manifold [phone]) on 07/29/2018 1:36:26 PM   Radiology No results found.  Procedures Procedures (including critical care time)  Medications Ordered in ED Medications  morphine 4 MG/ML injection 4 mg (4 mg Intravenous Given 07/29/18 1411)     Initial Impression / Assessment and Plan / ED Course  I have reviewed the triage vital signs and the nursing notes.  Pertinent labs & imaging results that were available during my care of the patient were reviewed by me and considered in my medical decision making (see chart for details).       58 year old female with chest pain.  Seems atypical for ACS.  Doubt PE, dissection of the emergent process.  Additionally some bright red blood per rectum.  I doubt serious GI bleed.  She is Hemoccult  positive.  She is hemodynamically stable though.  Blood levels appear to be close to baseline.  Abdominal exam is benign.  Return precautions were discussed.  Close outpatient GI follow-up otherwise. Final Clinical Impressions(s) / ED Diagnoses   Final diagnoses:  BRBPR (bright red blood per rectum)    ED Discharge Orders    None       Virgel Manifold, MD 08/02/18 [phone]

## 2018-08-17 ENCOUNTER — Telehealth: Payer: Self-pay | Admitting: General Surgery

## 2018-08-17 NOTE — Telephone Encounter (Signed)
Left a voicemail [phone] for the patient to contact the office to Covid Screen.

## 2018-08-17 NOTE — Telephone Encounter (Signed)
Spoke with the patient and she needs to cancel the appointment due to lack of transportation. She was asked to reschedule but was reluctant to do so without having a ride lined up. Patient was given our phone number and will call back to reschedule

## 2018-08-18 ENCOUNTER — Ambulatory Visit: Payer: Medicaid Other | Admitting: Gastroenterology

## 2018-10-04 ENCOUNTER — Encounter (HOSPITAL_COMMUNITY): Payer: Self-pay | Admitting: Emergency Medicine

## 2018-10-04 ENCOUNTER — Emergency Department (HOSPITAL_COMMUNITY)
Admission: EM | Admit: 2018-10-04 | Discharge: 2018-10-04 | Disposition: A | Discharge status: Home / Self Care | Payer: Medicaid Other | Attending: Emergency Medicine | Admitting: Emergency Medicine

## 2018-10-04 ENCOUNTER — Other Ambulatory Visit: Payer: Self-pay

## 2018-10-04 DIAGNOSIS — F10920 Alcohol use, unspecified with intoxication, uncomplicated: Secondary | ICD-10-CM | POA: Diagnosis not present

## 2018-10-04 DIAGNOSIS — J45909 Unspecified asthma, uncomplicated: Secondary | ICD-10-CM | POA: Diagnosis not present

## 2018-10-04 DIAGNOSIS — Z79899 Other long term (current) drug therapy: Secondary | ICD-10-CM | POA: Diagnosis not present

## 2018-10-04 DIAGNOSIS — R531 Weakness: Secondary | ICD-10-CM | POA: Diagnosis present

## 2018-10-04 DIAGNOSIS — I10 Essential (primary) hypertension: Secondary | ICD-10-CM | POA: Diagnosis not present

## 2018-10-04 LAB — MAGNESIUM: Magnesium: 1.8 mg/dL (ref 1.7–2.4)

## 2018-10-04 LAB — COMPREHENSIVE METABOLIC PANEL
ALT: 72 U/L — ABNORMAL HIGH (ref 0–44)
AST: 255 U/L — ABNORMAL HIGH (ref 15–41)
Albumin: 3.8 g/dL (ref 3.5–5.0)
Alkaline Phosphatase: 106 U/L (ref 38–126)
Anion gap: 15 (ref 5–15)
BUN: 7 mg/dL (ref 6–20)
CO2: 18 mmol/L — ABNORMAL LOW (ref 22–32)
Calcium: 8.3 mg/dL — ABNORMAL LOW (ref 8.9–10.3)
Chloride: 102 mmol/L (ref 98–111)
Creatinine, Ser: 0.61 mg/dL (ref 0.44–1.00)
GFR calc Af Amer: >60 mL/min (ref >60)
GFR calc non Af Amer: >60 mL/min (ref >60)
Glucose, Bld: 88 mg/dL (ref 70–99)
Potassium: 4.5 mmol/L (ref 3.5–5.1)
Sodium: 135 mmol/L (ref 135–145)
Total Bilirubin: 0.2 mg/dL — ABNORMAL LOW (ref 0.3–1.2)
Total Protein: 7.4 g/dL (ref 6.5–8.1)

## 2018-10-04 LAB — CBC WITH DIFFERENTIAL/PLATELET
Abs Immature Granulocytes: 0 10*3/uL (ref 0.00–0.07)
Basophils Absolute: 0.1 10*3/uL (ref 0.0–0.1)
Basophils Relative: 2 %
Eosinophils Absolute: 0 10*3/uL (ref 0.0–0.5)
Eosinophils Relative: 1 %
HCT: 36.5 % (ref 36.0–46.0)
Hemoglobin: 11.6 g/dL — ABNORMAL LOW (ref 12.0–15.0)
Immature Granulocytes: 0 %
Lymphocytes Relative: 44 %
Lymphs Abs: 1.4 10*3/uL (ref 0.7–4.0)
MCH: 30.9 pg (ref 26.0–34.0)
MCHC: 31.8 g/dL (ref 30.0–36.0)
MCV: 97.3 fL (ref 80.0–100.0)
Monocytes Absolute: 0.3 10*3/uL (ref 0.1–1.0)
Monocytes Relative: 8 %
Neutro Abs: 1.4 10*3/uL — ABNORMAL LOW (ref 1.7–7.7)
Neutrophils Relative %: 45 %
Platelets: 114 10*3/uL — ABNORMAL LOW (ref 150–400)
RBC: 3.75 MIL/uL — ABNORMAL LOW (ref 3.87–5.11)
RDW: 16.4 % — ABNORMAL HIGH (ref 11.5–15.5)
WBC: 3.2 10*3/uL — ABNORMAL LOW (ref 4.0–10.5)
nRBC: 0 % (ref 0.0–0.2)

## 2018-10-04 LAB — ETHANOL: Alcohol, Ethyl (B): 455 mg/dL (ref <10)

## 2018-10-04 NOTE — ED Notes (Signed)
Patient ambulatory to restroom without difficulty. 

## 2018-10-04 NOTE — ED Notes (Signed)
Patient reports she is tired of waiting for voucher and her friend will pay for cab. Ambulatory without difficulty.

## 2018-10-04 NOTE — Discharge Instructions (Addendum)
Drink alcohol only in moderation.  Return here for concerning changes in your condition.

## 2018-10-04 NOTE — ED Notes (Signed)
ED Provider at bedside. 

## 2018-10-04 NOTE — ED Notes (Signed)
Patient given sandwich and sprite 

## 2018-10-04 NOTE — ED Notes (Signed)
Date and time results received: 10/04/18 4:44 PM  (use smartphrase ".now" to insert current time)  Test: ETOH Critical Value: 455  Name of Provider Notified: Frederico Hamman, RN  Orders Received? Or Actions Taken?: Actions Taken: Engineer, structural

## 2018-10-04 NOTE — ED Notes (Signed)
Water given to patient by Dr. Vanita Panda.

## 2018-10-04 NOTE — ED Provider Notes (Signed)
[facility] DEPT Provider Note   CSN: [id number] Arrival date & time: 10/04/18  1458     History   Chief Complaint Chief Complaint  Patient presents with  . Alcohol Intoxication  . Weakness    HPI Brandy Bishop is a 58 y.o. female.     HPI Patient presents with concern of weakness, seemingly on the part of her family members per The patient self denies pain, states that she feels poorly in general, denies vomiting, fever. Patient has a history of alcohol abuse, and reportedly stayed out overnight drinking prior to today's evaluation. The patient is awake and alert, answering questions briefly, and is ambulatory, there is some delay, and suspicion for acute alcohol intoxication. Past Medical History:  Diagnosis Date  . Alcoholism ([place]) 2011  . Asthma   . Chronic lower back pain   . Colitis 04/2014.   Bloody diarrhea  . Daily headache   . GIB (gastrointestinal bleeding)   . Hypertension   . Pancreatitis    Chronic pancreatitis noted on CT scan in 04/2014.  Marland Kitchen Polysubstance abuse ([place]) 2011  . Pyelonephritis 05/2012  . Seizures ([place])    "don't know what they are from" (04/14/2014)    Patient Active Problem List   Diagnosis Date Noted  . Gastroesophageal reflux disease with esophagitis   . Seizure ([place]) 05/30/2015  . Cough   . Subtherapeutic phenytoin level   . Acute upper GI bleed   . Hypomagnesemia 04/16/2014  . Hypokalemia 04/16/2014  . Thrombocytopenia ([place]) 04/16/2014  . Hepatic steatosis 04/15/2014  . Alcohol abuse 04/15/2014  . Chronic pancreatitis ([place]) 04/15/2014  . Protein-calorie malnutrition, severe ([place]) 04/15/2014  . Severe protein-calorie malnutrition ([place]) 04/15/2014  . Bleeding gastrointestinal   . Abnormal CT scan, colon   . Rectal bleeding   . Abdominal pain   . Alcoholic hepatitis without ascites   . GI bleed 04/14/2014  . Viral URI with cough 05/07/2012  . Pyelonephritis 05/07/2012  . Seizure disorder  ([place]) 05/07/2012  . Asthma with bronchitis 05/07/2012  . HTN (hypertension) 05/07/2012    Past Surgical History:  Procedure Laterality Date  . ESOPHAGOGASTRODUODENOSCOPY N/A 05/31/2015   Procedure: ESOPHAGOGASTRODUODENOSCOPY (EGD);  Surgeon: Irene Shipper, MD;  [facility];  Service: Endoscopy;  Laterality: N/A;  . NO PAST SURGERIES       OB History   No obstetric history on file.      Home Medications    Prior to Admission medications   Medication Sig Start Date End Date Taking? Authorizing Provider  albuterol (PROVENTIL HFA;VENTOLIN HFA) 108 (90 Base) MCG/ACT inhaler Inhale 1-2 puffs into the lungs every 6 (six) hours as needed for wheezing or shortness of breath.   Yes [provider]  amLODipine (NORVASC) 10 MG tablet Take 10 mg by mouth daily. 06/30/18  Yes [provider]  atenolol (TENORMIN) 25 MG tablet Take 2 tablets (50 mg total) by mouth daily. Patient taking differently: Take 25 mg by mouth daily.  10/15/14  Yes Debroah Baller M, NP  ferrous sulfate 325 (65 FE) MG tablet Take 1 tablet (325 mg total) by mouth 2 (two) times daily with a meal. 06/08/17  Yes Virgel Manifold, MD  FLOVENT HFA 110 MCG/ACT inhaler Take 2 puffs by mouth 2 (two) times daily. 11/05/17  Yes [provider]  hydrochlorothiazide (HYDRODIURIL) 25 MG tablet Take 25 mg by mouth daily. 10/18/17  Yes [provider]  PAZEO 0.7 % SOLN Apply 1 drop to eye  every morning. 10/10/17  Yes [provider]  phenytoin (DILANTIN) 300 MG ER capsule Take 1 capsule (300 mg total) by mouth at bedtime. 06/01/15  Yes Robbie Lis, MD  RESTASIS 0.05 % ophthalmic emulsion Apply 1 drop to eye 2 (two) times daily. 10/10/17  Yes [provider]  traMADol (ULTRAM) 50 MG tablet Take 1 tablet (50 mg total) by mouth every 6 (six) hours as needed. Patient taking differently: Take 50 mg by mouth every 6 (six) hours as needed for moderate pain.  06/08/17  Yes Virgel Manifold, MD   azithromycin (ZITHROMAX) 250 MG tablet Take 1 tablet (250 mg total) by mouth daily. Take first 2 tablets together, then 1 every day until finished. Patient not taking: Reported on 10/04/2018 11/27/17   Montine Circle, PA-C  benzonatate (TESSALON) 100 MG capsule Take 2 capsules (200 mg total) by mouth 2 (two) times daily as needed for cough. Patient not taking: Reported on 10/04/2018 11/27/17   Montine Circle, PA-C    Family History Family History  Problem Relation Age of Onset  . Cancer Mother   . Cancer Brother     Social History Social History   Tobacco Use  . Smoking status: Never Smoker  . Smokeless tobacco: Never Used  Substance Use Topics  . Alcohol use: Yes    Alcohol/week: 10.0 standard drinks    Types: 10 Cans of beer per week    Comment: 04/14/2014 "I drink all of them; I'm not drinking anything anymore"  . Drug use: No     Allergies   Penicillins   Review of Systems Review of Systems  Constitutional:       Per HPI, otherwise negative  HENT:       Per HPI, otherwise negative  Respiratory:       Per HPI, otherwise negative  Cardiovascular:       Per HPI, otherwise negative  Gastrointestinal: Positive for nausea. Negative for vomiting.  Endocrine:       Negative aside from HPI  Genitourinary:       Neg aside from HPI   Musculoskeletal:       Per HPI, otherwise negative  Skin: Negative.   Neurological: Positive for weakness. Negative for syncope.     Physical Exam Updated Vital Signs BP 114/77 (BP Location: Right Arm)   Pulse 80   Temp 97.6 F (36.4 C) (Oral)   Resp 15   SpO2 100%   Physical Exam Vitals signs and nursing note reviewed.  Constitutional:      General: She is not in acute distress.    Appearance: She is well-developed.     Comments: Frail in appearance adult female  HENT:     Head: Normocephalic and atraumatic.  Eyes:     Conjunctiva/sclera: Conjunctivae normal.  Cardiovascular:     Rate and Rhythm: Normal rate and  regular rhythm.  Pulmonary:     Effort: Pulmonary effort is normal. No respiratory distress.     Breath sounds: Normal breath sounds. No stridor.  Abdominal:     General: There is no distension.  Skin:    General: Skin is warm and dry.  Neurological:     Mental Status: She is alert and oriented to person, place, and time.     Cranial Nerves: No cranial nerve deficit.     Motor: Atrophy present.  Psychiatric:     Comments: Withdrawn, slow, but participating in conversations.      ED Treatments / Results  Labs (all  labs ordered are listed, but only abnormal results are displayed) Labs Reviewed  COMPREHENSIVE METABOLIC PANEL - Abnormal; Notable for the following components:      Result Value   CO2 18 (*)    Calcium 8.3 (*)    AST 255 (*)    ALT 72 (*)    Total Bilirubin 0.2 (*)    All other components within normal limits  ETHANOL - Abnormal; Notable for the following components:   Alcohol, Ethyl (B) 455 (*)    All other components within normal limits  CBC WITH DIFFERENTIAL/PLATELET - Abnormal; Notable for the following components:   WBC 3.2 (*)    RBC 3.75 (*)    Hemoglobin 11.6 (*)    RDW 16.4 (*)    Platelets 114 (*)    Neutro Abs 1.4 (*)    All other components within normal limits  MAGNESIUM    EKG None  Radiology No results found.  Procedures Procedures (including critical care time)  Medications Ordered in ED Medications - No data to display   Initial Impression / Assessment and Plan / ED Course  I have reviewed the triage vital signs and the nursing notes.  Pertinent labs & imaging results that were available during my care of the patient were reviewed by me and considered in my medical decision making (see chart for details).    Patient ambulatory   Labs notable for alcohol level greater than 450.  On repeat exam the patient is in no distress. Patient's labs reassuring. Patient is awake, alert, though she has substantial alcohol elevation,  there is some suspicion for chronic disease given her functionality yet these levels. Patient's physical exam does not demonstrate trauma, vital signs are unremarkable, the patient will require time for sobriety.  Update: Patient awake alert sitting upright eating, walking, without any difficulty at all.  No evidence for trauma or other acute findings.  Final Clinical Impressions(s) / ED Diagnoses   Final diagnoses:  Acute alcoholic intoxication without complication [facility])      Carmin Muskrat, MD 10/04/18 2233

## 2018-10-04 NOTE — ED Notes (Signed)
AC contacted regarding cab voucher for patient.

## 2018-10-04 NOTE — ED Triage Notes (Signed)
Patient arrived via [facility] from home.   C/O "Everything" and feeling weak.   Family states patient came home drunk this morning.   Patient is asleep with ems.   A/Ox4 Ambulated with ems

## 2018-12-25 ENCOUNTER — Emergency Department (HOSPITAL_COMMUNITY)
Admission: EM | Admit: 2018-12-25 | Discharge: 2018-12-26 | Disposition: A | Discharge status: Home / Self Care | Payer: Medicaid Other | Attending: Emergency Medicine | Admitting: Emergency Medicine

## 2018-12-25 ENCOUNTER — Encounter (HOSPITAL_COMMUNITY): Payer: Self-pay | Admitting: Emergency Medicine

## 2018-12-25 DIAGNOSIS — Z5321 Procedure and treatment not carried out due to patient leaving prior to being seen by health care provider: Secondary | ICD-10-CM | POA: Insufficient documentation

## 2018-12-25 DIAGNOSIS — R109 Unspecified abdominal pain: Secondary | ICD-10-CM | POA: Insufficient documentation

## 2018-12-25 LAB — COMPREHENSIVE METABOLIC PANEL
ALT: 43 U/L (ref 0–44)
AST: 131 U/L — ABNORMAL HIGH (ref 15–41)
Albumin: 3.9 g/dL (ref 3.5–5.0)
Alkaline Phosphatase: 89 U/L (ref 38–126)
Anion gap: 15 (ref 5–15)
BUN: 6 mg/dL (ref 6–20)
CO2: 21 mmol/L — ABNORMAL LOW (ref 22–32)
Calcium: 8 mg/dL — ABNORMAL LOW (ref 8.9–10.3)
Chloride: 100 mmol/L (ref 98–111)
Creatinine, Ser: 0.51 mg/dL (ref 0.44–1.00)
GFR calc Af Amer: >60 mL/min (ref >60)
GFR calc non Af Amer: >60 mL/min (ref >60)
Glucose, Bld: 82 mg/dL (ref 70–99)
Potassium: 4.5 mmol/L (ref 3.5–5.1)
Sodium: 136 mmol/L (ref 135–145)
Total Bilirubin: 1 mg/dL (ref 0.3–1.2)
Total Protein: 7.2 g/dL (ref 6.5–8.1)

## 2018-12-25 LAB — LIPASE, BLOOD: Lipase: 17 U/L (ref 11–51)

## 2018-12-25 LAB — CBC
HCT: 33.2 % — ABNORMAL LOW (ref 36.0–46.0)
Hemoglobin: 10.4 g/dL — ABNORMAL LOW (ref 12.0–15.0)
MCH: 31.2 pg (ref 26.0–34.0)
MCHC: 31.3 g/dL (ref 30.0–36.0)
MCV: 99.7 fL (ref 80.0–100.0)
Platelets: 68 10*3/uL — ABNORMAL LOW (ref 150–400)
RBC: 3.33 MIL/uL — ABNORMAL LOW (ref 3.87–5.11)
RDW: 16.6 % — ABNORMAL HIGH (ref 11.5–15.5)
WBC: 2.7 10*3/uL — ABNORMAL LOW (ref 4.0–10.5)
nRBC: 0 % (ref 0.0–0.2)

## 2018-12-25 NOTE — ED Triage Notes (Signed)
Arrives via EMS, C/C right sided abd pain, frank blood in bowel movement, patient reports 3 BMs in the last two hours, unknown amount, ETOH onboard (vodka). Patient unable to give specifics.

## 2019-04-02 ENCOUNTER — Emergency Department (HOSPITAL_COMMUNITY)
Admission: EM | Admit: 2019-04-02 | Discharge: 2019-04-03 | Disposition: A | Discharge status: Home / Self Care | Payer: Medicaid Other | Attending: Emergency Medicine | Admitting: Emergency Medicine

## 2019-04-02 ENCOUNTER — Encounter (HOSPITAL_COMMUNITY): Payer: Self-pay | Admitting: Emergency Medicine

## 2019-04-02 ENCOUNTER — Other Ambulatory Visit: Payer: Self-pay

## 2019-04-02 DIAGNOSIS — Z79899 Other long term (current) drug therapy: Secondary | ICD-10-CM | POA: Diagnosis not present

## 2019-04-02 DIAGNOSIS — R197 Diarrhea, unspecified: Secondary | ICD-10-CM | POA: Diagnosis not present

## 2019-04-02 DIAGNOSIS — J45909 Unspecified asthma, uncomplicated: Secondary | ICD-10-CM | POA: Insufficient documentation

## 2019-04-02 DIAGNOSIS — Y908 Blood alcohol level of 240 mg/100 ml or more: Secondary | ICD-10-CM | POA: Diagnosis not present

## 2019-04-02 DIAGNOSIS — R112 Nausea with vomiting, unspecified: Secondary | ICD-10-CM | POA: Insufficient documentation

## 2019-04-02 DIAGNOSIS — I10 Essential (primary) hypertension: Secondary | ICD-10-CM | POA: Insufficient documentation

## 2019-04-02 DIAGNOSIS — F10920 Alcohol use, unspecified with intoxication, uncomplicated: Secondary | ICD-10-CM | POA: Insufficient documentation

## 2019-04-02 DIAGNOSIS — R109 Unspecified abdominal pain: Secondary | ICD-10-CM | POA: Diagnosis present

## 2019-04-02 DIAGNOSIS — N12 Tubulo-interstitial nephritis, not specified as acute or chronic: Secondary | ICD-10-CM | POA: Insufficient documentation

## 2019-04-02 LAB — CBC
HCT: 32.5 % — ABNORMAL LOW (ref 36.0–46.0)
Hemoglobin: 10.6 g/dL — ABNORMAL LOW (ref 12.0–15.0)
MCH: 29.9 pg (ref 26.0–34.0)
MCHC: 32.6 g/dL (ref 30.0–36.0)
MCV: 91.8 fL (ref 80.0–100.0)
Platelets: 72 10*3/uL — ABNORMAL LOW (ref 150–400)
RBC: 3.54 MIL/uL — ABNORMAL LOW (ref 3.87–5.11)
RDW: 18.6 % — ABNORMAL HIGH (ref 11.5–15.5)
WBC: 6.9 10*3/uL (ref 4.0–10.5)
nRBC: 0 % (ref 0.0–0.2)

## 2019-04-02 LAB — URINALYSIS, ROUTINE W REFLEX MICROSCOPIC
Bilirubin Urine: NEGATIVE
Glucose, UA: NEGATIVE mg/dL
Ketones, ur: NEGATIVE mg/dL
Nitrite: POSITIVE — AB
Protein, ur: NEGATIVE mg/dL
Specific Gravity, Urine: 1.006 (ref 1.005–1.030)
pH: 5 (ref 5.0–8.0)

## 2019-04-02 LAB — COMPREHENSIVE METABOLIC PANEL
ALT: 31 U/L (ref 0–44)
AST: 77 U/L — ABNORMAL HIGH (ref 15–41)
Albumin: 3.7 g/dL (ref 3.5–5.0)
Alkaline Phosphatase: 93 U/L (ref 38–126)
Anion gap: 13 (ref 5–15)
BUN: <5 mg/dL — ABNORMAL LOW (ref 6–20)
CO2: 25 mmol/L (ref 22–32)
Calcium: 7.9 mg/dL — ABNORMAL LOW (ref 8.9–10.3)
Chloride: 96 mmol/L — ABNORMAL LOW (ref 98–111)
Creatinine, Ser: 0.64 mg/dL (ref 0.44–1.00)
GFR calc Af Amer: >60 mL/min (ref >60)
GFR calc non Af Amer: >60 mL/min (ref >60)
Glucose, Bld: 84 mg/dL (ref 70–99)
Potassium: 3.1 mmol/L — ABNORMAL LOW (ref 3.5–5.1)
Sodium: 134 mmol/L — ABNORMAL LOW (ref 135–145)
Total Bilirubin: 0.6 mg/dL (ref 0.3–1.2)
Total Protein: 7.3 g/dL (ref 6.5–8.1)

## 2019-04-02 LAB — LIPASE, BLOOD: Lipase: 13 U/L (ref 11–51)

## 2019-04-02 LAB — ETHANOL: Alcohol, Ethyl (B): 443 mg/dL (ref <10)

## 2019-04-02 MED ORDER — SODIUM CHLORIDE 0.9% FLUSH
3.0000 mL | Freq: Once | INTRAVENOUS | Status: AC
  Administered 2019-04-03: 3 mL via INTRAVENOUS

## 2019-04-02 NOTE — ED Triage Notes (Signed)
Patient arrived with EMS from home reports RLQ abdominal pain for 2 weeks with emesis and diarrhea , + ETOH intake today ( Hx. Alcoholism) , no fever or chills .

## 2019-04-03 MED ORDER — ACETAMINOPHEN 500 MG PO TABS
1000.0000 mg | ORAL_TABLET | Freq: Once | ORAL | Status: AC
  Administered 2019-04-03: 1000 mg via ORAL
  Filled 2019-04-03: qty 2

## 2019-04-03 MED ORDER — SULFAMETHOXAZOLE-TRIMETHOPRIM 800-160 MG PO TABS
1.0000 | ORAL_TABLET | Freq: Once | ORAL | Status: AC
  Administered 2019-04-03: 1 via ORAL
  Filled 2019-04-03: qty 1

## 2019-04-03 MED ORDER — SODIUM CHLORIDE 0.9 % IV SOLN
1000.0000 mL | INTRAVENOUS | Status: DC
  Administered 2019-04-03: 1000 mL via INTRAVENOUS

## 2019-04-03 MED ORDER — SODIUM CHLORIDE 0.9 % IV BOLUS (SEPSIS)
1000.0000 mL | Freq: Once | INTRAVENOUS | Status: AC
  Administered 2019-04-03: 1000 mL via INTRAVENOUS

## 2019-04-03 MED ORDER — SULFAMETHOXAZOLE-TRIMETHOPRIM 800-160 MG PO TABS: 1.0000 | ORAL_TABLET | Freq: Two times a day (BID) | ORAL | 0 refills | Status: AC

## 2019-04-03 MED ORDER — ONDANSETRON 4 MG PO TBDP: 4.0000 mg | ORAL_TABLET | Freq: Three times a day (TID) | ORAL | 0 refills | Status: AC | PRN

## 2019-04-03 NOTE — ED Provider Notes (Signed)
[facility] EMERGENCY DEPARTMENT Provider Note  CSN: [id number] Arrival date & time: 04/02/19 2211  Chief Complaint(s) Abdominal Pain  HPI Brandy Bishop is a 59 y.o. female with a past medical history listed below including alcoholism who presents to the emergency department with right-sided flank pain.  Worse with movement and palpation.  No alleviating factors.  Patient endorsing associated nausea and nonbloody nonbilious emesis.  And some episodes of diarrhea.  No reported fevers or chills.  No chest pain or shortness of breath.  Mild dysuria.  No other physical complaints.  HPI  Past Medical History Past Medical History:  Diagnosis Date  . Alcoholism ([place]) 2011  . Asthma   . Chronic lower back pain   . Colitis 04/2014.   Bloody diarrhea  . Daily headache   . GIB (gastrointestinal bleeding)   . Hypertension   . Pancreatitis    Chronic pancreatitis noted on CT scan in 04/2014.  Marland Kitchen Polysubstance abuse ([place]) 2011  . Pyelonephritis 05/2012  . Seizures ([place])    "don't know what they are from" (04/14/2014)   Patient Active Problem List   Diagnosis Date Noted  . Gastroesophageal reflux disease with esophagitis   . Seizure ([place]) 05/30/2015  . Cough   . Subtherapeutic phenytoin level   . Acute upper GI bleed   . Hypomagnesemia 04/16/2014  . Hypokalemia 04/16/2014  . Thrombocytopenia ([place]) 04/16/2014  . Hepatic steatosis 04/15/2014  . Alcohol abuse 04/15/2014  . Chronic pancreatitis ([place]) 04/15/2014  . Protein-calorie malnutrition, severe ([place]) 04/15/2014  . Severe protein-calorie malnutrition ([place]) 04/15/2014  . Bleeding gastrointestinal   . Abnormal CT scan, colon   . Rectal bleeding   . Abdominal pain   . Alcoholic hepatitis without ascites   . GI bleed 04/14/2014  . Viral URI with cough 05/07/2012  . Pyelonephritis 05/07/2012  . Seizure disorder ([place]) 05/07/2012  . Asthma with bronchitis 05/07/2012  . HTN (hypertension) 05/07/2012   Home  Medication(s) Prior to Admission medications   Medication Sig Start Date End Date Taking? Authorizing Provider  albuterol (PROVENTIL HFA;VENTOLIN HFA) 108 (90 Base) MCG/ACT inhaler Inhale 1-2 puffs into the lungs every 6 (six) hours as needed for wheezing or shortness of breath.    [provider]  amLODipine (NORVASC) 10 MG tablet Take 10 mg by mouth daily. 06/30/18   [provider]  atenolol (TENORMIN) 25 MG tablet Take 2 tablets (50 mg total) by mouth daily. Patient taking differently: Take 25 mg by mouth daily.  10/15/14   [name], NP  azithromycin (ZITHROMAX) 250 MG tablet Take 1 tablet (250 mg total) by mouth daily. Take first 2 tablets together, then 1 every day until finished. Patient not taking: Reported on 10/04/2018 11/27/17   Montine Circle, PA-C  benzonatate (TESSALON) 100 MG capsule Take 2 capsules (200 mg total) by mouth 2 (two) times daily as needed for cough. Patient not taking: Reported on 10/04/2018 11/27/17   Montine Circle, PA-C  ferrous sulfate 325 (65 FE) MG tablet Take 1 tablet (325 mg total) by mouth 2 (two) times daily with a meal. 06/08/17   Virgel Manifold, MD  FLOVENT HFA 110 MCG/ACT inhaler Take 2 puffs by mouth 2 (two) times daily. 11/05/17   [provider]  hydrochlorothiazide (HYDRODIURIL) 25 MG tablet Take 25 mg by mouth daily. 10/18/17   [provider]  ondansetron (ZOFRAN ODT) 4 MG disintegrating tablet Take 1 tablet (4 mg total) by mouth every 8 (eight) hours as needed for  up to 3 days for nausea or vomiting. 04/03/19 04/06/19  Fatima Blank, MD  PAZEO 0.7 % SOLN Apply 1 drop to eye every morning. 10/10/17   [provider]  phenytoin (DILANTIN) 300 MG ER capsule Take 1 capsule (300 mg total) by mouth at bedtime. 06/01/15   Robbie Lis, MD  RESTASIS 0.05 % ophthalmic emulsion Apply 1 drop to eye 2 (two) times daily. 10/10/17   [provider]  sulfamethoxazole-trimethoprim (BACTRIM DS) 800-160 MG  tablet Take 1 tablet by mouth 2 (two) times daily for 14 days. 04/03/19 04/17/19  Fatima Blank, MD  traMADol (ULTRAM) 50 MG tablet Take 1 tablet (50 mg total) by mouth every 6 (six) hours as needed. Patient taking differently: Take 50 mg by mouth every 6 (six) hours as needed for moderate pain.  06/08/17   Virgel Manifold, MD                                                                                                                                    Past Surgical History Past Surgical History:  Procedure Laterality Date  . ESOPHAGOGASTRODUODENOSCOPY N/A 05/31/2015   Procedure: ESOPHAGOGASTRODUODENOSCOPY (EGD);  Surgeon: Irene Shipper, MD;  Location: [facility] ENDOSCOPY;  Service: Endoscopy;  Laterality: N/A;  . NO PAST SURGERIES     Family History Family History  Problem Relation Age of Onset  . Cancer Mother   . Cancer Brother     Social History Social History   Tobacco Use  . Smoking status: Never Smoker  . Smokeless tobacco: Never Used  Substance Use Topics  . Alcohol use: Yes    Alcohol/week: 10.0 standard drinks    Types: 10 Cans of beer per week    Comment: 04/14/2014 "I drink all of them; I'm not drinking anything anymore"  . Drug use: No   Allergies Penicillins  Review of Systems Review of Systems All other systems are reviewed and are negative for acute change except as noted in the HPI  Physical Exam Vital Signs  I have reviewed the triage vital signs BP (!) 123/100 (BP Location: Right Arm)   Pulse 90   Temp 98.4 F (36.9 C) (Oral)   Resp 14   Ht 5\' 3"  (1.6 m)   Wt 50 kg   SpO2 99%   BMI 19.53 kg/m   Physical Exam Vitals reviewed.  Constitutional:      General: She is not in acute distress.    Appearance: She is well-developed. She is not diaphoretic.  HENT:     Head: Normocephalic and atraumatic.     Nose: Nose normal.  Eyes:     General: No scleral icterus.       Right eye: No discharge.        Left eye: No discharge.     Conjunctiva/sclera:  Conjunctivae normal.     Pupils: Pupils are equal, round, and reactive to light.  Cardiovascular:     Rate and Rhythm: Normal rate and regular rhythm.     Heart sounds: No murmur. No friction rub. No gallop.   Pulmonary:     Effort: Pulmonary effort is normal. No respiratory distress.     Breath sounds: Normal breath sounds. No stridor. No rales.  Abdominal:     General: There is no distension.     Palpations: Abdomen is soft.     Tenderness: There is abdominal tenderness in the suprapubic area. There is right CVA tenderness.  Musculoskeletal:        General: No tenderness.     Cervical back: Normal range of motion and neck supple.  Skin:    General: Skin is warm and dry.     Findings: No erythema or rash.  Neurological:     Mental Status: She is alert and oriented to person, place, and time.     ED Results and Treatments Labs (all labs ordered are listed, but only abnormal results are displayed) Labs Reviewed  COMPREHENSIVE METABOLIC PANEL - Abnormal; Notable for the following components:      Result Value   Sodium 134 (*)    Potassium 3.1 (*)    Chloride 96 (*)    BUN <5 (*)    Calcium 7.9 (*)    AST 77 (*)    All other components within normal limits  CBC - Abnormal; Notable for the following components:   RBC 3.54 (*)    Hemoglobin 10.6 (*)    HCT 32.5 (*)    RDW 18.6 (*)    Platelets 72 (*)    All other components within normal limits  URINALYSIS, ROUTINE W REFLEX MICROSCOPIC - Abnormal; Notable for the following components:   APPearance HAZY (*)    Hgb urine dipstick SMALL (*)    Nitrite POSITIVE (*)    Leukocytes,Ua MODERATE (*)    Bacteria, UA MANY (*)    All other components within normal limits  ETHANOL - Abnormal; Notable for the following components:   Alcohol, Ethyl (B) 443 (*)    All other components within normal limits  LIPASE, BLOOD                                                                                                                          EKG  EKG Interpretation  Date/Time:    Ventricular Rate:    PR Interval:    QRS Duration:   QT Interval:    QTC Calculation:   R Axis:     Text Interpretation:        Radiology No results found.  Pertinent labs & imaging results that were available during my care of the patient were reviewed by me and considered in my medical decision making (see chart for details).  Medications Ordered in ED Medications  sodium chloride flush (NS) 0.9 % injection 3 mL (3 mLs Intravenous Given 04/03/19 0123)  sodium chloride 0.9 % bolus 1,000 mL (0  mLs Intravenous Stopped 04/03/19 0303)  sulfamethoxazole-trimethoprim (BACTRIM DS) 800-160 MG per tablet 1 tablet (1 tablet Oral Given 04/03/19 0124)  acetaminophen (TYLENOL) tablet 1,000 mg (1,000 mg Oral Given 04/03/19 0124)                                                                                                                                    Procedures Procedures  (including critical care time)  Medical Decision Making / ED Course I have reviewed the nursing notes for this encounter and the patient's prior records (if available in EHR or on provided paperwork).   DNAYA RAYNE was evaluated in Emergency Department on 04/04/2019 for the symptoms described in the history of present illness. She was evaluated in the context of the global COVID-19 pandemic, which necessitated consideration that the patient might be at risk for infection with the SARS-CoV-2 virus that causes COVID-19. Institutional protocols and algorithms that pertain to the evaluation of patients at risk for COVID-19 are in a state of rapid change based on information released by regulatory bodies including the CDC and federal and state organizations. These policies and algorithms were followed during the patient's care in the ED.  Work-up consistent with pyelonephritis.  Patient does not appear to be septic.  She is intoxicated.  No acute distress.  Allowed to  metabolize to freedom in the emergency department.  Given first dose of antibiotic here.  Able to tolerate oral intake.  Low suspicion for other serious intra-abdominal findings or/infectious process requiring additional imaging.  She was monitored for several hours without decompensation.  Patient metabolize to freedom and ambulate without complication.      Final Clinical Impression(s) / ED Diagnoses Final diagnoses:  Alcoholic intoxication without complication ([place])  Pyelonephritis   The patient appears reasonably screened and/or stabilized for discharge and I doubt any other medical condition or other [facility] requiring further screening, evaluation, or treatment in the ED at this time prior to discharge. Safe for discharge with strict return precautions.  Disposition: Discharge  Condition: Good  I have discussed the results, Dx and Tx plan with the patient/family who expressed understanding and agree(s) with the plan. Discharge instructions discussed at length. The patient/family was given strict return precautions who verbalized understanding of the instructions. No further questions at time of discharge.    ED Discharge Orders         Ordered    sulfamethoxazole-trimethoprim (BACTRIM DS) 800-160 MG tablet  2 times daily     04/03/19 0740    ondansetron (ZOFRAN ODT) 4 MG disintegrating tablet  Every 8 hours PRN     04/03/19 0740           Follow Up: Eston Esters, NP [place] [address] [phone]  Schedule an appointment as soon as possible for a visit  in 5-7 days      This chart was dictated using voice recognition software.  Despite best efforts to proofread,  errors can occur which can change the documentation meaning.   Fatima Blank, MD 04/04/19 [phone]

## 2019-04-03 NOTE — ED Notes (Signed)
Pt was given D/C papers, all questions and concerns were addressed. 1 bus ticket was given for transportation home.

## 2019-04-03 NOTE — ED Notes (Signed)
Patient ambulated without difficulty. Reported no shortness of breath and no dizziness.

## 2019-04-04 ENCOUNTER — Telehealth: Payer: Self-pay | Admitting: Surgery

## 2019-04-04 NOTE — Telephone Encounter (Signed)
ED CM received call from patient concerning the prescriptions that were sent to [facility] on [address].  Patient states she was told the cost would be $60,  Patient tells me she is disable and does not get out and would like to have them delivered.  CM contacted [facility] and they can provide delivery but the courier will not pick up until tomorrow, CM updated patient and she is agreeable. No further ED CM needs identified

## 2019-04-05 ENCOUNTER — Telehealth: Payer: Self-pay | Admitting: *Deleted

## 2019-04-05 NOTE — Telephone Encounter (Signed)
TOC CM received call from pt states [facility] reports they do not have her medication. She is requesting that medications be sent into [facility] # [phone]. They deliver her medications to her home. CM called meds into her pharmacy. Added pharmacy to her profile. Contacted [facility] to back out of insurance so [facility] can bill her insurance. Pt contacted her PCP to arrange appt. [place], [place] ED TOC CM [phone]

## 2019-05-01 ENCOUNTER — Encounter (HOSPITAL_COMMUNITY): Payer: Self-pay | Admitting: Emergency Medicine

## 2019-05-01 ENCOUNTER — Other Ambulatory Visit: Payer: Self-pay

## 2019-05-01 ENCOUNTER — Emergency Department (HOSPITAL_COMMUNITY): Payer: Medicaid Other

## 2019-05-01 ENCOUNTER — Emergency Department (HOSPITAL_COMMUNITY)
Admission: EM | Admit: 2019-05-01 | Discharge: 2019-05-01 | Disposition: A | Discharge status: Home / Self Care | Payer: Medicaid Other | Attending: Emergency Medicine | Admitting: Emergency Medicine

## 2019-05-01 DIAGNOSIS — Y9301 Activity, walking, marching and hiking: Secondary | ICD-10-CM | POA: Insufficient documentation

## 2019-05-01 DIAGNOSIS — S59901A Unspecified injury of right elbow, initial encounter: Secondary | ICD-10-CM | POA: Diagnosis present

## 2019-05-01 DIAGNOSIS — I10 Essential (primary) hypertension: Secondary | ICD-10-CM | POA: Insufficient documentation

## 2019-05-01 DIAGNOSIS — Z79899 Other long term (current) drug therapy: Secondary | ICD-10-CM | POA: Diagnosis not present

## 2019-05-01 DIAGNOSIS — J45909 Unspecified asthma, uncomplicated: Secondary | ICD-10-CM | POA: Insufficient documentation

## 2019-05-01 DIAGNOSIS — W19XXXA Unspecified fall, initial encounter: Secondary | ICD-10-CM

## 2019-05-01 DIAGNOSIS — Y998 Other external cause status: Secondary | ICD-10-CM | POA: Insufficient documentation

## 2019-05-01 DIAGNOSIS — W010XXA Fall on same level from slipping, tripping and stumbling without subsequent striking against object, initial encounter: Secondary | ICD-10-CM | POA: Diagnosis not present

## 2019-05-01 DIAGNOSIS — S42411A Displaced simple supracondylar fracture without intercondylar fracture of right humerus, initial encounter for closed fracture: Secondary | ICD-10-CM

## 2019-05-01 DIAGNOSIS — Y929 Unspecified place or not applicable: Secondary | ICD-10-CM | POA: Insufficient documentation

## 2019-05-01 MED ORDER — FENTANYL CITRATE (PF) 100 MCG/2ML IJ SOLN
50.0000 ug | Freq: Once | INTRAMUSCULAR | Status: AC
  Administered 2019-05-01: 03:00:00 50 ug via INTRAMUSCULAR
  Filled 2019-05-01: qty 2

## 2019-05-01 MED ORDER — HYDROCODONE-ACETAMINOPHEN 5-325 MG PO TABS: 1.0000 | ORAL_TABLET | Freq: Four times a day (QID) | ORAL | 0 refills | Status: DC | PRN

## 2019-05-01 NOTE — ED Triage Notes (Signed)
59 yo female BIB [facility] s/p fall. Pt complaining of right elbow pain. Pt states pain 10/10. Pt states she was on her way to friends house and it was dark outside causing her to trip and on her right side landing on her elbow.  Pt reports drinking 2-3 beers earlier this evening.

## 2019-05-01 NOTE — ED Provider Notes (Signed)
Patient seen/examined in the Emergency Department in conjunction with Advanced Practice Provider [facility] Patient reports right elbow pain after fall.  She denies any other injuries Exam : Resting comfortably, no acute distress.  Tenderness and swelling noted to right elbow.  Distal pulses intact Plan: Patient has significant fracture.  Orthopedics recommends CT imaging    Ripley Fraise, MD 05/01/19 [phone]

## 2019-05-01 NOTE — ED Provider Notes (Signed)
[facility] DEPT Provider Note   CSN: [id number] Arrival date & time: 05/01/19  0236     History Chief Complaint  Patient presents with  . Elbow Pain    Brandy Bishop is a 59 y.o. female.   59 year old female with a history of asthma, hypertension, alcoholism and polysubstance abuse, chronic pain presents to the emergency department for evaluation of right elbow pain.  She states that she was walking to a friend's house in the dark when she slipped and fell.  She has been experiencing constant pain to her right elbow.  Pain aggravated with movement.  No medications taken prior to arrival.  She is right-hand dominant.  The history is provided by the patient. No language interpreter was used.       Past Medical History:  Diagnosis Date  . Alcoholism ([place]) 2011  . Asthma   . Chronic lower back pain   . Colitis 04/2014.   Bloody diarrhea  . Daily headache   . GIB (gastrointestinal bleeding)   . Hypertension   . Pancreatitis    Chronic pancreatitis noted on CT scan in 04/2014.  Marland Kitchen Polysubstance abuse ([place]) 2011  . Pyelonephritis 05/2012  . Seizures ([place])    "don't know what they are from" (04/14/2014)    Patient Active Problem List   Diagnosis Date Noted  . Gastroesophageal reflux disease with esophagitis   . Seizure ([place]) 05/30/2015  . Cough   . Subtherapeutic phenytoin level   . Acute upper GI bleed   . Hypomagnesemia 04/16/2014  . Hypokalemia 04/16/2014  . Thrombocytopenia ([place]) 04/16/2014  . Hepatic steatosis 04/15/2014  . Alcohol abuse 04/15/2014  . Chronic pancreatitis ([place]) 04/15/2014  . Protein-calorie malnutrition, severe ([place]) 04/15/2014  . Severe protein-calorie malnutrition ([place]) 04/15/2014  . Bleeding gastrointestinal   . Abnormal CT scan, colon   . Rectal bleeding   . Abdominal pain   . Alcoholic hepatitis without ascites   . GI bleed 04/14/2014  . Viral URI with cough 05/07/2012  . Pyelonephritis 05/07/2012  .  Seizure disorder ([place]) 05/07/2012  . Asthma with bronchitis 05/07/2012  . HTN (hypertension) 05/07/2012    Past Surgical History:  Procedure Laterality Date  . ESOPHAGOGASTRODUODENOSCOPY N/A 05/31/2015   Procedure: ESOPHAGOGASTRODUODENOSCOPY (EGD);  Surgeon: Irene Shipper, MD;  Location: [facility] ENDOSCOPY;  Service: Endoscopy;  Laterality: N/A;  . NO PAST SURGERIES       OB History   No obstetric history on file.     Family History  Problem Relation Age of Onset  . Cancer Mother   . Cancer Brother     Social History   Tobacco Use  . Smoking status: Never Smoker  . Smokeless tobacco: Never Used  Substance Use Topics  . Alcohol use: Yes    Alcohol/week: 10.0 standard drinks    Types: 10 Cans of beer per week    Comment: 04/14/2014 "I drink all of them; I'm not drinking anything anymore"  . Drug use: No    Home Medications Prior to Admission medications   Medication Sig Start Date End Date Taking? Authorizing Provider  albuterol (PROVENTIL HFA;VENTOLIN HFA) 108 (90 Base) MCG/ACT inhaler Inhale 1-2 puffs into the lungs every 6 (six) hours as needed for wheezing or shortness of breath.    [provider]  amLODipine (NORVASC) 10 MG tablet Take 10 mg by mouth daily. 06/30/18   [provider]  atenolol (TENORMIN) 25 MG tablet Take 2 tablets (50 mg total) by mouth daily.  Patient taking differently: Take 25 mg by mouth daily.  10/15/14   [name], NP  ferrous sulfate 325 (65 FE) MG tablet Take 1 tablet (325 mg total) by mouth 2 (two) times daily with a meal. 06/08/17   Virgel Manifold, MD  FLOVENT HFA 110 MCG/ACT inhaler Take 2 puffs by mouth 2 (two) times daily. 11/05/17   [provider]  hydrochlorothiazide (HYDRODIURIL) 25 MG tablet Take 25 mg by mouth daily. 10/18/17   [provider]  HYDROcodone-acetaminophen (NORCO/VICODIN) 5-325 MG tablet Take 1-2 tablets by mouth every 6 (six) hours as needed for severe pain. 05/01/19   Antonietta Breach, PA-C    PAZEO 0.7 % SOLN Apply 1 drop to eye every morning. 10/10/17   [provider]  phenytoin (DILANTIN) 300 MG ER capsule Take 1 capsule (300 mg total) by mouth at bedtime. 06/01/15   Robbie Lis, MD  RESTASIS 0.05 % ophthalmic emulsion Apply 1 drop to eye 2 (two) times daily. 10/10/17   [provider]    Allergies    Penicillins  Review of Systems   Review of Systems  Ten systems reviewed and are negative for acute change, except as noted in the HPI.    Physical Exam Updated Vital Signs BP (!) 148/85 (BP Location: Left Arm)   Pulse 83   Temp 98.1 F (36.7 C) (Oral)   Resp 14   SpO2 100%   Physical Exam Vitals and nursing note reviewed.  Constitutional:      General: She is not in acute distress.    Appearance: She is well-developed. She is not diaphoretic.     Comments: Patient appears uncomfortable, nontoxic.  HENT:     Head: Normocephalic and atraumatic.  Eyes:     General: No scleral icterus.    Conjunctiva/sclera: Conjunctivae normal.  Cardiovascular:     Rate and Rhythm: Normal rate and regular rhythm.     Pulses: Normal pulses.     Comments: Distal radial pulse 2+ in the RUE Pulmonary:     Effort: Pulmonary effort is normal. No respiratory distress.     Comments: Respirations even and unlabored Musculoskeletal:     Right elbow: Deformity present. Decreased range of motion. Tenderness present.     Cervical back: Normal range of motion.  Skin:    General: Skin is warm and dry.     Coloration: Skin is not pale.     Findings: No erythema or rash.  Neurological:     Mental Status: She is alert and oriented to person, place, and time.     Comments: Grip strength preserved in R hand.  Psychiatric:        Behavior: Behavior normal.     ED Results / Procedures / Treatments   Labs (all labs ordered are listed, but only abnormal results are displayed) Labs Reviewed - No data to display  EKG None  Radiology DG Elbow Complete Right  Result  Date: 05/01/2019 CLINICAL DATA:  Right elbow pain after fall. EXAM: RIGHT ELBOW - COMPLETE 3+ VIEW COMPARISON:  None. FINDINGS: Transverse supracondylar fracture with displacement and near 90 degree angulation. Proximal radius and ulna remain aligned with the distal fracture fragment. Soft tissue edema posteriorly with joint effusion. IMPRESSION: Displaced angulated supracondylar fracture, fracture is angulated at near 90 degrees. Electronically Signed   By: Keith Rake M.D.   On: 05/01/2019 03:39   CT Elbow Right Wo Contrast  Result Date: 05/01/2019 CLINICAL DATA:  Status post fall, elbow fracture  EXAM: CT OF THE LOWER RIGHT EXTREMITY WITHOUT CONTRAST TECHNIQUE: Multidetector CT imaging of the right lower extremity was performed according to the standard protocol. COMPARISON:  None. FINDINGS: Bones/Joint/Cartilage Acute minimally displaced supracondylar fracture with maximum of 3 mm of posterior displacement along the medial aspect. Medial fracture cleft extends into the medial epicondyle. No other acute fracture or dislocation. Small joint effusion. No aggressive osseous lesion. Ligaments Ligaments are suboptimally evaluated by CT. Muscles and Tendons Muscles are normal. No muscle atrophy. No intramuscular fluid collection. Soft tissue No fluid collection or hematoma. No soft tissue mass. IMPRESSION: Acute minimally displaced supracondylar fracture with maximum of 3 mm of posterior displacement along the medial aspect. Medial fracture cleft extends into the medial epicondyle. Electronically Signed   By: Kathreen Devoid   On: 05/01/2019 05:44    Procedures Procedures (including critical care time)  Medications Ordered in ED Medications  fentaNYL (SUBLIMAZE) injection 50 mcg (50 mcg Intramuscular Given 05/01/19 0308)    ED Course  I have reviewed the triage vital signs and the nursing notes.  Pertinent labs & imaging results that were available during my care of the patient were reviewed by me and  considered in my medical decision making (see chart for details).  Clinical Course as of May 01 555  Sat May 01, 2019  0347 Case discussed with Dr. Mardelle Matte who has reviewed the patient's imaging. Recommends CT elbow with posterior long arm splint; reports reduction will not improve alignment with elbow in flexed position. Advises office f/u on Monday - preferably with Dr. Griffin Basil.   [KH]    Clinical Course User Index [KH] Beverely Pace   MDM Rules/Calculators/A&P                       59 year old female presents to the emergency department following a mechanical fall.  Complaining of right elbow pain.  Right-hand-dominant.  Noted to be neurovascularly intact without other complaints of pain or injury.  Not chronically anticoagulated.  She underwent x-ray of her right elbow which shows a supracondylar fracture with displacement and angulation.  This was discussed with Dr. Mardelle Matte on-call for orthopedics.  He recommended posterior long-arm splint which was placed by Ortho tech.  Foam arm sling given for support and elevation.  Patient instructed on the need for close follow-up in the office with orthopedics.  She verbalizes understanding.  Return precautions discussed and provided. Patient discharged in stable condition with no unaddressed concerns.   Final Clinical Impression(s) / ED Diagnoses Final diagnoses:  Closed supracondylar fracture of right humerus, initial encounter  Fall, initial encounter    Rx / DC Orders ED Discharge Orders         Ordered    HYDROcodone-acetaminophen (NORCO/VICODIN) 5-325 MG tablet  Every 6 hours PRN     05/01/19 0549           Antonietta Breach, PA-C 05/01/19 0559    Ripley Fraise, MD 05/01/19 [phone]

## 2019-05-01 NOTE — ED Notes (Signed)
Pt does not want shirt cut off.  Will wait until removal is necessary before attempting to remove due to pt c/o severe pain.

## 2019-05-01 NOTE — Progress Notes (Signed)
Orthopedic Tech Progress Note Patient Details:  Brandy Bishop Jul 05, 1960 [id number]  Ortho Devices Type of Ortho Device: Arm sling, Post (long arm) splint Ortho Device/Splint Location: rue Ortho Device/Splint Interventions: Ordered, Application, Adjustment   Post Interventions Patient Tolerated: Well Instructions Provided: Care of device, Adjustment of device   Karolee Stamps 05/01/2019, 5:15 AM

## 2019-05-01 NOTE — Discharge Instructions (Addendum)
Wear your splint at all times.  Do not remove it.  We recommend the use of a sling to promote elevation of your arm.  Ice over top of your splint 3-4 times per day to limit swelling.  You may take Norco as needed for severe pain.  Do not drive or drink alcohol after taking this medication as it may make you drowsy and impair your judgment.  It is important that you follow-up with orthopedics to ensure proper healing of your broken bone.  Call the office of Dr. Griffin Basil on Monday morning to be seen later on that day or on Tuesday.  Return to the ED for new or concerning symptoms.

## 2019-05-06 NOTE — Progress Notes (Signed)
Need orders in epic Surgery on 05/12/19.  Thank You.

## 2019-05-10 ENCOUNTER — Encounter (HOSPITAL_COMMUNITY): Payer: Self-pay

## 2019-05-10 ENCOUNTER — Other Ambulatory Visit (HOSPITAL_COMMUNITY)
Admission: RE | Admit: 2019-05-10 | Discharge: 2019-05-10 | Disposition: A | Discharge status: Home / Self Care | Payer: Medicaid Other | Source: Ambulatory Visit | Attending: Orthopaedic Surgery | Admitting: Orthopaedic Surgery

## 2019-05-10 ENCOUNTER — Other Ambulatory Visit: Payer: Self-pay

## 2019-05-10 ENCOUNTER — Encounter (HOSPITAL_COMMUNITY)
Admission: RE | Admit: 2019-05-10 | Discharge: 2019-05-10 | Disposition: A | Discharge status: Home / Self Care | Payer: Medicaid Other | Source: Ambulatory Visit | Attending: Orthopaedic Surgery | Admitting: Orthopaedic Surgery

## 2019-05-10 DIAGNOSIS — Z01812 Encounter for preprocedural laboratory examination: Secondary | ICD-10-CM | POA: Insufficient documentation

## 2019-05-10 DIAGNOSIS — Z20822 Contact with and (suspected) exposure to covid-19: Secondary | ICD-10-CM | POA: Diagnosis not present

## 2019-05-10 LAB — SARS CORONAVIRUS 2 (TAT 6-24 HRS): SARS Coronavirus 2: NEGATIVE

## 2019-05-11 NOTE — H&P (Signed)
PREOPERATIVE H&P  Chief Complaint: RIGHT SUPRACONDYLAR ELBOW FRACTURE  HPI: Brandy Bishop is a 59 y.o. female who is scheduled for OPEN REDUCTION INTERNAL FIXATION (ORIF) DISTAL HUMERUS FRACTURE.   Patient has a past medical history significant for asthma, hypertension, alcoholism and polysubstance abuse, chronic pain.   Patient was seen in the Emergency Department on 05/01/2019 for right elbow pain. She was walking outside and slipped and fell landing on her right elbow. She felt immediate pain in her right elbow. This was worsened with any movement. She is right hand dominant. X-rays in the ED showed supracondylar fracture with displacement and angulation. She was placed in a posterior long-arm splint and told to follow-up with orthopedics.   Her symptoms are rated as moderate to severe, and have been worsening.  This is significantly impairing activities of daily living.    Please see clinic note for further details on this patient's care.    She has elected for surgical management.   Past Medical History:  Diagnosis Date  . Alcoholism ([place]) 2011  . Asthma   . Chronic lower back pain   . Colitis 04/2014.   Bloody diarrhea  . Daily headache   . GIB (gastrointestinal bleeding)   . Hypertension   . Pancreatitis    Chronic pancreatitis noted on CT scan in 04/2014.  Marland Kitchen Polysubstance abuse ([place]) 2011  . Pyelonephritis 05/2012  . Seizures ([place])    last seizure was 04/2019 per patient    Past Surgical History:  Procedure Laterality Date  . ESOPHAGOGASTRODUODENOSCOPY N/A 05/31/2015   Procedure: ESOPHAGOGASTRODUODENOSCOPY (EGD);  Surgeon: Irene Shipper, MD;  Location: [facility] ENDOSCOPY;  Service: Endoscopy;  Laterality: N/A;  . NO PAST SURGERIES     Social History   Socioeconomic History  . Marital status: Single    Spouse name: Not on file  . Number of children: 0  . Years of education: 30  . Highest education level: Not on file  Occupational History  . Not on file  Tobacco  Use  . Smoking status: Never Smoker  . Smokeless tobacco: Never Used  Substance and Sexual Activity  . Alcohol use: Yes    Alcohol/week: 10.0 standard drinks    Types: 10 Cans of beer per week  . Drug use: No  . Sexual activity: Never  Other Topics Concern  . Not on file  Social History Narrative   Lives in [place].    Disabled due to seizures.    Stay with a friend.    Single. No kids.    Social Determinants of Health   Financial Resource Strain:   . Difficulty of Paying Living Expenses:   Food Insecurity:   . Worried About Charity fundraiser in the Last Year:   . Arboriculturist in the Last Year:   Transportation Needs:   . Film/video editor (Medical):   Marland Kitchen Lack of Transportation (Non-Medical):   Physical Activity:   . Days of Exercise per Week:   . Minutes of Exercise per Session:   Stress:   . Feeling of Stress :   Social Connections:   . Frequency of Communication with Friends and Family:   . Frequency of Social Gatherings with Friends and Family:   . Attends Religious Services:   . Active Member of Clubs or Organizations:   . Attends Archivist Meetings:   Marland Kitchen Marital Status:    Family History  Problem Relation Age of Onset  . Cancer Mother   .  Cancer Brother    Allergies  Allergen Reactions  . Penicillins Swelling    .Has patient had a PCN reaction causing immediate rash, facial/tongue/throat swelling, SOB or lightheadedness with hypotension: Yes Has patient had a PCN reaction causing severe rash involving mucus membranes or skin necrosis: No Has patient had a PCN reaction that required hospitalization No Has patient had a PCN reaction occurring within the last 10 years: No If all of the above answers are "NO", then may proceed with Cephalosporin use.    Prior to Admission medications   Medication Sig Start Date End Date Taking? Authorizing Provider  albuterol (PROVENTIL HFA;VENTOLIN HFA) 108 (90 Base) MCG/ACT inhaler Inhale 1-2 puffs into the  lungs every 6 (six) hours as needed for wheezing or shortness of breath.   Yes [provider]  amLODipine (NORVASC) 10 MG tablet Take 10 mg by mouth daily. 06/30/18  Yes [provider]  betamethasone valerate ointment (VALISONE) 0.1 % Apply 1 application topically 2 (two) times daily as needed (itching).   Yes [provider]  cefdinir (OMNICEF) 300 MG capsule Take 300 mg by mouth 2 (two) times daily.   Yes [provider]  FLOVENT HFA 110 MCG/ACT inhaler Take 2 puffs by mouth 2 (two) times daily. 11/05/17  Yes [provider]  hydrochlorothiazide (HYDRODIURIL) 25 MG tablet Take 25 mg by mouth daily. 10/18/17  Yes [provider]  HYDROcodone-acetaminophen (NORCO/VICODIN) 5-325 MG tablet Take 1-2 tablets by mouth every 6 (six) hours as needed for severe pain. 05/01/19  Yes Antonietta Breach, PA-C  hydrOXYzine (ATARAX/VISTARIL) 25 MG tablet Take 25 mg by mouth at bedtime. 04/05/19  Yes [provider]  naproxen (NAPROSYN) 500 MG tablet Take 500 mg by mouth 2 (two) times daily with a meal.   Yes [provider]  omeprazole (PRILOSEC) 20 MG capsule Take 20 mg by mouth daily. 02/25/19  Yes [provider]  phenytoin (DILANTIN) 300 MG ER capsule Take 1 capsule (300 mg total) by mouth at bedtime. 06/01/15  Yes Robbie Lis, MD  sulfamethoxazole-trimethoprim (BACTRIM DS) 800-160 MG tablet Take 1 tablet by mouth 2 (two) times daily. 05/01/19  Yes [provider]  atenolol (TENORMIN) 25 MG tablet Take 2 tablets (50 mg total) by mouth daily. Patient not taking: Reported on 05/07/2019 10/15/14   [name], NP  ferrous sulfate 325 (65 FE) MG tablet Take 1 tablet (325 mg total) by mouth 2 (two) times daily with a meal. Patient not taking: Reported on 05/07/2019 06/08/17   Virgel Manifold, MD    ROS: All other systems have been reviewed and were otherwise negative with the exception of those mentioned in the HPI and as above.  Physical  Exam: General: Alert, no acute distress Cardiovascular: No pedal edema Respiratory: No cyanosis, no use of accessory musculature GI: No organomegaly, abdomen is soft and non-tender Skin: No lesions in the area of chief complaint Neurologic: Sensation intact distally Psychiatric: Patient is competent for consent with normal mood and affect Lymphatic: No axillary or cervical lymphadenopathy  MUSCULOSKELETAL:  Right upper extremity: Splint CDI. Skin intact though cannot assess fully beneath splint. Nontender to palpation proximally. Distal motor and sensory function are intact. Warm well perfused hand.   Imaging: CT right elbow: "Acute minimally displaced supracondylar fracture with maximum of 25mm of posterior displacement along the medial aspect. Medial fracture cleft extends into the medial epicondyle."  Assessment: RIGHT SUPRACONDYLAR ELBOW FRACTURE  Plan: Plan for Procedure(s): OPEN REDUCTION INTERNAL FIXATION (ORIF) DISTAL  HUMERUS FRACTURE  The risks benefits and alternatives were discussed with the patient including but not limited to the risks of nonoperative treatment, versus surgical intervention including infection, bleeding, nerve injury,  blood clots, cardiopulmonary complications, morbidity, mortality, among others, and they were willing to proceed.   The patient acknowledged the explanation, agreed to proceed with the plan and consent was signed.   Operative Plan: ORIF right supracondylar fracture Discharge Medications: Tylenol, Celebrex, Oxycodone, Zofran DVT Prophylaxis: None Physical Therapy: +/- outpatient PT Special Discharge needs: Sling   Ethelda Chick, PA-C  05/11/2019 7:51 AM

## 2019-05-12 ENCOUNTER — Encounter (HOSPITAL_COMMUNITY): Payer: Self-pay | Admitting: Orthopaedic Surgery

## 2019-05-12 ENCOUNTER — Other Ambulatory Visit: Payer: Self-pay

## 2019-05-12 ENCOUNTER — Ambulatory Visit (HOSPITAL_COMMUNITY): Payer: Medicaid Other

## 2019-05-12 ENCOUNTER — Ambulatory Visit (HOSPITAL_COMMUNITY): Payer: Medicaid Other | Admitting: Certified Registered"

## 2019-05-12 ENCOUNTER — Ambulatory Visit (HOSPITAL_COMMUNITY): Payer: Medicaid Other | Admitting: Physician Assistant

## 2019-05-12 ENCOUNTER — Ambulatory Visit (HOSPITAL_COMMUNITY)
Admission: RE | Admit: 2019-05-12 | Discharge: 2019-05-12 | Disposition: A | Discharge status: Home / Self Care | Payer: Medicaid Other | Attending: Orthopaedic Surgery | Admitting: Orthopaedic Surgery

## 2019-05-12 ENCOUNTER — Encounter (HOSPITAL_COMMUNITY)
Admission: RE | Disposition: A | Discharge status: Home / Self Care | Payer: Self-pay | Source: Home / Self Care | Attending: Orthopaedic Surgery

## 2019-05-12 DIAGNOSIS — S42411A Displaced simple supracondylar fracture without intercondylar fracture of right humerus, initial encounter for closed fracture: Secondary | ICD-10-CM | POA: Insufficient documentation

## 2019-05-12 DIAGNOSIS — W010XXA Fall on same level from slipping, tripping and stumbling without subsequent striking against object, initial encounter: Secondary | ICD-10-CM | POA: Insufficient documentation

## 2019-05-12 DIAGNOSIS — Z9114 Patient's other noncompliance with medication regimen: Secondary | ICD-10-CM | POA: Diagnosis not present

## 2019-05-12 DIAGNOSIS — Z79899 Other long term (current) drug therapy: Secondary | ICD-10-CM | POA: Insufficient documentation

## 2019-05-12 DIAGNOSIS — Z792 Long term (current) use of antibiotics: Secondary | ICD-10-CM | POA: Diagnosis not present

## 2019-05-12 DIAGNOSIS — Z7951 Long term (current) use of inhaled steroids: Secondary | ICD-10-CM | POA: Insufficient documentation

## 2019-05-12 DIAGNOSIS — Z880 Allergy status to penicillin: Secondary | ICD-10-CM | POA: Insufficient documentation

## 2019-05-12 DIAGNOSIS — Y9301 Activity, walking, marching and hiking: Secondary | ICD-10-CM | POA: Insufficient documentation

## 2019-05-12 DIAGNOSIS — G8929 Other chronic pain: Secondary | ICD-10-CM | POA: Diagnosis not present

## 2019-05-12 DIAGNOSIS — F1020 Alcohol dependence, uncomplicated: Secondary | ICD-10-CM | POA: Diagnosis not present

## 2019-05-12 DIAGNOSIS — I10 Essential (primary) hypertension: Secondary | ICD-10-CM | POA: Insufficient documentation

## 2019-05-12 DIAGNOSIS — J45909 Unspecified asthma, uncomplicated: Secondary | ICD-10-CM | POA: Diagnosis not present

## 2019-05-12 DIAGNOSIS — Z419 Encounter for procedure for purposes other than remedying health state, unspecified: Secondary | ICD-10-CM

## 2019-05-12 DIAGNOSIS — Z09 Encounter for follow-up examination after completed treatment for conditions other than malignant neoplasm: Secondary | ICD-10-CM

## 2019-05-12 DIAGNOSIS — Z791 Long term (current) use of non-steroidal anti-inflammatories (NSAID): Secondary | ICD-10-CM | POA: Insufficient documentation

## 2019-05-12 HISTORY — PX: ORIF HUMERUS FRACTURE: SHX2126

## 2019-05-12 LAB — CBC
HCT: 30.8 % — ABNORMAL LOW (ref 36.0–46.0)
Hemoglobin: 9.4 g/dL — ABNORMAL LOW (ref 12.0–15.0)
MCH: 30.5 pg (ref 26.0–34.0)
MCHC: 30.5 g/dL (ref 30.0–36.0)
MCV: 100 fL (ref 80.0–100.0)
Platelets: 179 10*3/uL (ref 150–400)
RBC: 3.08 MIL/uL — ABNORMAL LOW (ref 3.87–5.11)
RDW: 17.9 % — ABNORMAL HIGH (ref 11.5–15.5)
WBC: 6.7 10*3/uL (ref 4.0–10.5)
nRBC: 0 % (ref 0.0–0.2)

## 2019-05-12 LAB — BASIC METABOLIC PANEL
Anion gap: 12 (ref 5–15)
BUN: 8 mg/dL (ref 6–20)
CO2: 27 mmol/L (ref 22–32)
Calcium: 9 mg/dL (ref 8.9–10.3)
Chloride: 104 mmol/L (ref 98–111)
Creatinine, Ser: 0.47 mg/dL (ref 0.44–1.00)
GFR calc Af Amer: >60 mL/min (ref >60)
GFR calc non Af Amer: >60 mL/min (ref >60)
Glucose, Bld: 93 mg/dL (ref 70–99)
Potassium: 4.2 mmol/L (ref 3.5–5.1)
Sodium: 143 mmol/L (ref 135–145)

## 2019-05-12 SURGERY — OPEN REDUCTION INTERNAL FIXATION (ORIF) DISTAL HUMERUS FRACTURE
Anesthesia: General | Laterality: Right

## 2019-05-12 MED ORDER — ONDANSETRON HCL 4 MG/2ML IJ SOLN
INTRAMUSCULAR | Status: DC | PRN
  Administered 2019-05-12: 4 mg via INTRAVENOUS

## 2019-05-12 MED ORDER — ACETAMINOPHEN 325 MG PO TABS: 325.0000 mg | ORAL_TABLET | ORAL | Status: DC | PRN

## 2019-05-12 MED ORDER — BUPIVACAINE-EPINEPHRINE (PF) 0.5% -1:200000 IJ SOLN
INTRAMUSCULAR | Status: DC | PRN
  Administered 2019-05-12: 10 mL via PERINEURAL

## 2019-05-12 MED ORDER — FENTANYL CITRATE (PF) 100 MCG/2ML IJ SOLN
50.0000 ug | INTRAMUSCULAR | Status: DC
  Administered 2019-05-12: 12:00:00 100 ug via INTRAVENOUS
  Filled 2019-05-12: qty 2

## 2019-05-12 MED ORDER — SUGAMMADEX SODIUM 200 MG/2ML IV SOLN
INTRAVENOUS | Status: DC | PRN
  Administered 2019-05-12: 100 mg via INTRAVENOUS

## 2019-05-12 MED ORDER — LABETALOL HCL 5 MG/ML IV SOLN
INTRAVENOUS | Status: DC | PRN
  Administered 2019-05-12 (×2): 5 mg via INTRAVENOUS
  Administered 2019-05-12: 10 mg via INTRAVENOUS

## 2019-05-12 MED ORDER — CLINDAMYCIN PHOSPHATE 900 MG/50ML IV SOLN
900.0000 mg | INTRAVENOUS | Status: AC
  Administered 2019-05-12: 900 mg via INTRAVENOUS
  Filled 2019-05-12: qty 50

## 2019-05-12 MED ORDER — LACTATED RINGERS IV SOLN: INTRAVENOUS | Status: DC

## 2019-05-12 MED ORDER — DEXAMETHASONE SODIUM PHOSPHATE 10 MG/ML IJ SOLN
INTRAMUSCULAR | Status: AC
  Filled 2019-05-12: qty 1

## 2019-05-12 MED ORDER — 0.9 % SODIUM CHLORIDE (POUR BTL) OPTIME
TOPICAL | Status: DC | PRN
  Administered 2019-05-12: 1000 mL

## 2019-05-12 MED ORDER — ACETAMINOPHEN 325 MG PO TABS: 650.0000 mg | ORAL_TABLET | Freq: Three times a day (TID) | ORAL | 0 refills | Status: AC

## 2019-05-12 MED ORDER — BUPIVACAINE LIPOSOME 1.3 % IJ SUSP
INTRAMUSCULAR | Status: DC | PRN
  Administered 2019-05-12: 10 mL via PERINEURAL

## 2019-05-12 MED ORDER — HYDRALAZINE HCL 20 MG/ML IJ SOLN
INTRAMUSCULAR | Status: AC
  Filled 2019-05-12: qty 1

## 2019-05-12 MED ORDER — LABETALOL HCL 5 MG/ML IV SOLN
INTRAVENOUS | Status: AC
  Administered 2019-05-12: 12:00:00 5 mg via INTRAVENOUS
  Filled 2019-05-12: qty 4

## 2019-05-12 MED ORDER — ROCURONIUM BROMIDE 10 MG/ML (PF) SYRINGE
PREFILLED_SYRINGE | INTRAVENOUS | Status: AC
  Filled 2019-05-12: qty 10

## 2019-05-12 MED ORDER — LIDOCAINE 2% (20 MG/ML) 5 ML SYRINGE
INTRAMUSCULAR | Status: AC
  Filled 2019-05-12: qty 5

## 2019-05-12 MED ORDER — VANCOMYCIN HCL 1000 MG IV SOLR
INTRAVENOUS | Status: AC
  Filled 2019-05-12: qty 1000

## 2019-05-12 MED ORDER — OXYCODONE HCL 5 MG PO TABS: 5.0000 mg | ORAL_TABLET | Freq: Once | ORAL | Status: DC | PRN

## 2019-05-12 MED ORDER — HYDRALAZINE HCL 20 MG/ML IJ SOLN
INTRAMUSCULAR | Status: DC | PRN
  Administered 2019-05-12: 4 mg via INTRAVENOUS

## 2019-05-12 MED ORDER — ROCURONIUM BROMIDE 100 MG/10ML IV SOLN
INTRAVENOUS | Status: DC | PRN
  Administered 2019-05-12: 50 mg via INTRAVENOUS

## 2019-05-12 MED ORDER — DEXAMETHASONE SODIUM PHOSPHATE 10 MG/ML IJ SOLN
INTRAMUSCULAR | Status: DC | PRN
  Administered 2019-05-12: 8 mg via INTRAVENOUS

## 2019-05-12 MED ORDER — OXYCODONE HCL 5 MG/5ML PO SOLN: 5.0000 mg | Freq: Once | ORAL | Status: DC | PRN

## 2019-05-12 MED ORDER — VANCOMYCIN HCL 1000 MG IV SOLR
INTRAVENOUS | Status: DC | PRN
  Administered 2019-05-12: 1000 mg

## 2019-05-12 MED ORDER — LIDOCAINE HCL (CARDIAC) PF 100 MG/5ML IV SOSY
PREFILLED_SYRINGE | INTRAVENOUS | Status: DC | PRN
  Administered 2019-05-12: 30 mg via INTRAVENOUS

## 2019-05-12 MED ORDER — ONDANSETRON HCL 4 MG/2ML IJ SOLN
INTRAMUSCULAR | Status: AC
  Filled 2019-05-12: qty 2

## 2019-05-12 MED ORDER — LABETALOL HCL 5 MG/ML IV SOLN
5.0000 mg | Freq: Once | INTRAVENOUS | Status: AC
  Administered 2019-05-12: 5 mg via INTRAVENOUS

## 2019-05-12 MED ORDER — BUPIVACAINE-EPINEPHRINE (PF) 0.5% -1:200000 IJ SOLN
INTRAMUSCULAR | Status: AC
  Filled 2019-05-12: qty 30

## 2019-05-12 MED ORDER — PROPOFOL 10 MG/ML IV BOLUS
INTRAVENOUS | Status: DC | PRN
  Administered 2019-05-12: 40 mg via INTRAVENOUS
  Administered 2019-05-12: 100 mg via INTRAVENOUS
  Administered 2019-05-12: 20 mg via INTRAVENOUS

## 2019-05-12 MED ORDER — MEPERIDINE HCL 50 MG/ML IJ SOLN: 6.2500 mg | INTRAMUSCULAR | Status: DC | PRN

## 2019-05-12 MED ORDER — GABAPENTIN 100 MG PO CAPS: 100.0000 mg | ORAL_CAPSULE | Freq: Two times a day (BID) | ORAL | 0 refills | Status: DC

## 2019-05-12 MED ORDER — FENTANYL CITRATE (PF) 100 MCG/2ML IJ SOLN: 25.0000 ug | INTRAMUSCULAR | Status: DC | PRN

## 2019-05-12 MED ORDER — ACETAMINOPHEN 160 MG/5ML PO SOLN: 325.0000 mg | ORAL | Status: DC | PRN

## 2019-05-12 MED ORDER — CELECOXIB 50 MG PO CAPS: 50.0000 mg | ORAL_CAPSULE | Freq: Two times a day (BID) | ORAL | 0 refills | Status: AC

## 2019-05-12 MED ORDER — ONDANSETRON HCL 4 MG PO TABS: 4.0000 mg | ORAL_TABLET | Freq: Three times a day (TID) | ORAL | 1 refills | Status: AC | PRN

## 2019-05-12 MED ORDER — MIDAZOLAM HCL 2 MG/2ML IJ SOLN
1.0000 mg | INTRAMUSCULAR | Status: DC
  Administered 2019-05-12: 2 mg via INTRAVENOUS
  Filled 2019-05-12: qty 2

## 2019-05-12 MED ORDER — PROPOFOL 10 MG/ML IV BOLUS
INTRAVENOUS | Status: AC
  Filled 2019-05-12: qty 20

## 2019-05-12 MED ORDER — LABETALOL HCL 5 MG/ML IV SOLN
INTRAVENOUS | Status: AC
  Filled 2019-05-12: qty 4

## 2019-05-12 MED ORDER — ONDANSETRON HCL 4 MG/2ML IJ SOLN: 4.0000 mg | Freq: Once | INTRAMUSCULAR | Status: DC | PRN

## 2019-05-12 MED ORDER — FENTANYL CITRATE (PF) 100 MCG/2ML IJ SOLN
INTRAMUSCULAR | Status: DC | PRN
  Administered 2019-05-12: 100 ug via INTRAVENOUS

## 2019-05-12 MED ORDER — FENTANYL CITRATE (PF) 100 MCG/2ML IJ SOLN
INTRAMUSCULAR | Status: AC
  Filled 2019-05-12: qty 2

## 2019-05-12 MED ORDER — DEXAMETHASONE SODIUM PHOSPHATE 10 MG/ML IJ SOLN: INTRAMUSCULAR | Status: DC | PRN

## 2019-05-12 MED ORDER — SODIUM CHLORIDE (PF) 0.9 % IJ SOLN
INTRAMUSCULAR | Status: AC
  Filled 2019-05-12: qty 10

## 2019-05-12 MED ORDER — OXYCODONE HCL 5 MG PO TABS: ORAL_TABLET | ORAL | 0 refills | Status: AC

## 2019-05-12 SURGICAL SUPPLY — 73 items
ADH SKN CLS APL DERMABOND .7 (GAUZE/BANDAGES/DRESSINGS) ×1
BAG SPEC THK2 15X12 ZIP CLS (MISCELLANEOUS) ×1
BAG ZIPLOCK 12X15 (MISCELLANEOUS) ×3 IMPLANT
BIT DRILL 2.5X2.75 QC CALB (BIT) ×4 IMPLANT
BIT DRILL CALIBRATED 2.7 (BIT) ×1 IMPLANT
BIT DRILL CALIBRATED 2.7MM (BIT) ×1
BNDG CMPR 9X4 STRL LF SNTH (GAUZE/BANDAGES/DRESSINGS) ×1
BNDG ELASTIC 4X5.8 VLCR STR LF (GAUZE/BANDAGES/DRESSINGS) ×4 IMPLANT
BNDG ESMARK 4X9 LF (GAUZE/BANDAGES/DRESSINGS) ×2 IMPLANT
COVER SURGICAL LIGHT HANDLE (MISCELLANEOUS) ×3 IMPLANT
COVER WAND RF STERILE (DRAPES) IMPLANT
CUFF TOURN SGL QUICK 18X4 (TOURNIQUET CUFF) ×2 IMPLANT
DERMABOND ADVANCED (GAUZE/BANDAGES/DRESSINGS) ×2
DERMABOND ADVANCED .7 DNX12 (GAUZE/BANDAGES/DRESSINGS) ×1 IMPLANT
DRAPE C-ARM 42X120 X-RAY (DRAPES) ×3 IMPLANT
DRAPE C-ARMOR (DRAPES) ×2 IMPLANT
DRAPE INCISE IOBAN 66X45 STRL (DRAPES) ×2 IMPLANT
DRAPE ORTHO SPLIT 77X108 STRL (DRAPES) ×6
DRAPE SURG ORHT 6 SPLT 77X108 (DRAPES) ×2 IMPLANT
DRAPE U-SHAPE 47X51 STRL (DRAPES) ×3 IMPLANT
DRSG PAD ABDOMINAL 8X10 ST (GAUZE/BANDAGES/DRESSINGS) ×4 IMPLANT
DURAPREP 26ML APPLICATOR (WOUND CARE) ×3 IMPLANT
ELECT REM PT RETURN 15FT ADLT (MISCELLANEOUS) ×3 IMPLANT
GAUZE SPONGE 4X4 12PLY STRL (GAUZE/BANDAGES/DRESSINGS) ×2 IMPLANT
GAUZE XEROFORM 1X8 LF (GAUZE/BANDAGES/DRESSINGS) ×2 IMPLANT
GLOVE BIO SURGEON STRL SZ 6.5 (GLOVE) ×4 IMPLANT
GLOVE BIO SURGEON STRL SZ8 (GLOVE) ×3 IMPLANT
GLOVE BIO SURGEONS STRL SZ 6.5 (GLOVE) ×2
GLOVE BIOGEL PI IND STRL 6.5 (GLOVE) ×1 IMPLANT
GLOVE BIOGEL PI IND STRL 8 (GLOVE) ×2 IMPLANT
GLOVE BIOGEL PI INDICATOR 6.5 (GLOVE) ×2
GLOVE BIOGEL PI INDICATOR 8 (GLOVE) ×4
GLOVE ECLIPSE 8.0 STRL XLNG CF (GLOVE) ×3 IMPLANT
GOWN STRL REUS W/TWL LRG LVL3 (GOWN DISPOSABLE) ×3 IMPLANT
K-WIRE ACE 1.6X6 (WIRE) ×6
KIT BASIN OR (CUSTOM PROCEDURE TRAY) ×3 IMPLANT
KIT TURNOVER KIT A (KITS) IMPLANT
KWIRE ACE 1.6X6 (WIRE) IMPLANT
MANIFOLD NEPTUNE II (INSTRUMENTS) ×3 IMPLANT
NS IRRIG 1000ML POUR BTL (IV SOLUTION) ×3 IMPLANT
PACK SHOULDER (CUSTOM PROCEDURE TRAY) ×3 IMPLANT
PAD CAST 4YDX4 CTTN HI CHSV (CAST SUPPLIES) IMPLANT
PADDING CAST COTTON 4X4 STRL (CAST SUPPLIES) ×12
PENCIL SMOKE EVACUATOR (MISCELLANEOUS) IMPLANT
PLATE LOCK RT SM (Plate) ×6 IMPLANT
PLATE LOCK RT SM 74X10.7X3.5X9 (Plate) IMPLANT
PLATE LOCK RT SM 88X10.9X2.5X9 (Plate) IMPLANT
PROTECTOR NERVE ULNAR (MISCELLANEOUS) ×3 IMPLANT
SCREW CORT T15 30X3.5XST LCK (Screw) IMPLANT
SCREW CORTICAL 3.5X30MM (Screw) ×6 IMPLANT
SCREW LOCK 3.5X36 DIST TIB (Screw) ×2 IMPLANT
SCREW LOCK CORT STAR 3.5X12 (Screw) ×2 IMPLANT
SCREW LOCK CORT STAR 3.5X18 (Screw) ×2 IMPLANT
SCREW LOCK CORT STAR 3.5X22 (Screw) ×2 IMPLANT
SCREW LOCK CORT STAR 3.5X28 (Screw) ×2 IMPLANT
SCREW LOCK CORT STAR 3.5X38 (Screw) ×2 IMPLANT
SCREW LOW PROFILE 18MMX3.5MM (Screw) ×2 IMPLANT
SCREW NON LOCKING LP 3.5 16MM (Screw) ×2 IMPLANT
SLING ARM FOAM STRAP LRG (SOFTGOODS) ×3 IMPLANT
SLING ARM FOAM STRAP MED (SOFTGOODS) ×3 IMPLANT
SPLINT PLASTER CAST XFAST 5X30 (CAST SUPPLIES) IMPLANT
SPLINT PLASTER XFAST SET 5X30 (CAST SUPPLIES) ×2
SUT ETHILON 2 0 PS N (SUTURE) ×4 IMPLANT
SUT MNCRL AB 3-0 PS2 18 (SUTURE) ×3 IMPLANT
SUT MON AB 2-0 CT1 36 (SUTURE) ×3 IMPLANT
SUT VIC AB 0 CT2 27 (SUTURE) IMPLANT
SUT VIC AB 1 CT1 36 (SUTURE) ×3 IMPLANT
SUT VIC AB 3-0 SH 27 (SUTURE) ×6
SUT VIC AB 3-0 SH 27X BRD (SUTURE) IMPLANT
TOWEL OR 17X26 10 PK STRL BLUE (TOWEL DISPOSABLE) ×3 IMPLANT
TOWEL OR NON WOVEN STRL DISP B (DISPOSABLE) ×3 IMPLANT
WASHER 3.5MM (Orthopedic Implant) ×6 IMPLANT
WATER STERILE IRR 1000ML POUR (IV SOLUTION) ×3 IMPLANT

## 2019-05-12 NOTE — Anesthesia Procedure Notes (Signed)
Procedure Name: Intubation Date/Time: 05/12/2019 12:53 PM Performed by: Glory Buff, CRNA Pre-anesthesia Checklist: Patient identified, Emergency Drugs available, Suction available and Patient being monitored Patient Re-evaluated:Patient Re-evaluated prior to induction Oxygen Delivery Method: Circle system utilized Preoxygenation: Pre-oxygenation with 100% oxygen Induction Type: IV induction Ventilation: Mask ventilation without difficulty Laryngoscope Size: Miller and 3 Grade View: Grade I Tube type: Oral Tube size: 7.0 mm Number of attempts: 1 Airway Equipment and Method: Stylet and Oral airway Placement Confirmation: ETT inserted through vocal cords under direct vision,  positive ETCO2 and breath sounds checked- equal and bilateral Secured at: 21 cm Tube secured with: Tape Dental Injury: Teeth and Oropharynx as per pre-operative assessment

## 2019-05-12 NOTE — Anesthesia Procedure Notes (Signed)
Anesthesia Regional Block: Supraclavicular block   Pre-Anesthetic Checklist: ,, timeout performed, Correct Patient, Correct Site, Correct Laterality, Correct Procedure, Correct Position, site marked, Risks and benefits discussed,  Surgical consent,  Pre-op evaluation,  At surgeon's request and post-op pain management  Laterality: Right  Prep: chloraprep       Needles:  Injection technique: Single-shot  Needle Type: Echogenic Stimulator Needle     Needle Length: 5cm  Needle Gauge: 22     Additional Needles:   Procedures:, nerve stimulator,,, ultrasound used (permanent image in chart),,,,   Nerve Stimulator or Paresthesia:  Response: hand, 0.45 mA,   Additional Responses:   Narrative:  Start time: 05/12/2019 12:00 PM End time: 05/12/2019 12:25 PM Injection made incrementally with aspirations every 5 mL.  Performed by: Personally  Anesthesiologist: Janeece Riggers, MD  Additional Notes: Functioning IV was confirmed and monitors were applied.  A 70mm 22ga Arrow echogenic stimulator needle was used. Sterile prep and drape,hand hygiene and sterile gloves were used. Ultrasound guidance: relevant anatomy identified, needle position confirmed, local anesthetic spread visualized around nerve(s)., vascular puncture avoided.  Image printed for medical record. Negative aspiration and negative test dose prior to incremental administration of local anesthetic. The patient tolerated the procedure well.

## 2019-05-12 NOTE — Transfer of Care (Signed)
Immediate Anesthesia Transfer of Care Note  Patient: Brandy Bishop  Procedure(s) Performed: OPEN REDUCTION INTERNAL FIXATION (ORIF) DISTAL HUMERUS FRACTURE (Right )  Patient Location: PACU  Anesthesia Type:General  Level of Consciousness: drowsy, patient cooperative and responds to stimulation  Airway & Oxygen Therapy: Patient Spontanous Breathing and Patient connected to face mask oxygen  Post-op Assessment: Report given to RN and Post -op Vital signs reviewed and stable  Post vital signs: Reviewed and stable  Last Vitals:  Vitals Value Taken Time  BP 156/99 05/12/19 1532  Temp    Pulse 90 05/12/19 1536  Resp 18 05/12/19 1536  SpO2 100 % 05/12/19 1536  Vitals shown include unvalidated device data.  Last Pain:  Vitals:   05/12/19 1100  TempSrc:   PainSc: 8       Patients Stated Pain Goal: 3 ([id number])  Complications: No apparent anesthesia complications

## 2019-05-12 NOTE — Anesthesia Preprocedure Evaluation (Addendum)
Anesthesia Evaluation  Patient identified by MRN, date of birth, ID band Patient awake    Reviewed: Allergy & Precautions, H&P , NPO status , Patient's Chart, lab work & pertinent test results  History of Anesthesia Complications Negative for: history of anesthetic complications  Airway Mallampati: II  TM Distance: >3 FB Neck ROM: full    Dental no notable dental hx. (+) Poor Dentition, Chipped, Missing,    Pulmonary asthma ,    Pulmonary exam normal breath sounds clear to auscultation       Cardiovascular hypertension, Pt. on medications Normal cardiovascular exam Rhythm:regular Rate:Normal     Neuro/Psych  Headaches, Seizures -, Poorly Controlled,  PSYCHIATRIC DISORDERS    GI/Hepatic negative GI ROS, (+)     substance abuse  alcohol use, Hepatitis -Alcoholic and fatty liver disease   Endo/Other  negative endocrine ROS  Renal/GU negative Renal ROS     Musculoskeletal   Abdominal   Peds  Hematology  (+) Blood dyscrasia, anemia ,   Anesthesia Other Findings Medical noncompliance with seizure medication  Reproductive/Obstetrics negative OB ROS                            Anesthesia Physical  Anesthesia Plan  ASA: IV  Anesthesia Plan: General   Post-op Pain Management: GA combined w/ Regional for post-op pain   Induction: Intravenous  PONV Risk Score and Plan: 3 and Ondansetron, Treatment may vary due to age or medical condition and Dexamethasone  Airway Management Planned: Oral ETT and LMA  Additional Equipment:   Intra-op Plan:   Post-operative Plan: Extubation in OR  Informed Consent: I have reviewed the patients History and Physical, chart, labs and discussed the procedure including the risks, benefits and alternatives for the proposed anesthesia with the patient or authorized representative who has indicated his/her understanding and acceptance.     Dental Advisory  Given  Plan Discussed with: Anesthesiologist, CRNA and Surgeon  Anesthesia Plan Comments: (Discussed both nerve block for pain relief post-op and GA; including NV, sore throat, dental injury, and pulmonary complications  PT may be exhibiting alcohol withdrawal symptoms.  Not very articulate.  Versed for block has decreased tremors.   )      Anesthesia Quick Evaluation

## 2019-05-12 NOTE — Progress Notes (Signed)
AssistedDr. Ambrose Pancoast with right, ultrasound guided, interscalene  block. Side rails up, monitors on throughout procedure. See vital signs in flow sheet. Tolerated Procedure well.

## 2019-05-12 NOTE — Op Note (Signed)
Orthopaedic Surgery Operative Note (CSN: [id number])  Brandy Bishop  05/21/1960 Date of Surgery: 05/12/2019   Diagnoses:  RIGHT SUPRACONDYLAR ELBOW FRACTURE  Procedure: Right supracondylar humerus ORIF Right ulnar nerve neurolysis   Operative Finding Successful completion of the planned procedure.  Patient's fracture was reasonably well reduced with indirect and direct maneuvers without needing a osteotomy.  We were able to get good fixation with direct medial and posterior lateral plates though the bone quality was moderate to poor.  Range of motion at the end of the case was intact and full.  Nerve was placed back in its bed at the end of the case.  Post-operative plan: The patient will be nonweightbearing in a splint for a week and then okay for activities of daily living and range of motion.  The patient will be discharged home.  DVT prophylaxis not indicated in this ambulatory upper extremity patient without significant risk factors.  She is at high risk for bleeding as she has a history of alcohol abuse.  Pain control with PRN pain medication preferring oral medicines.  Follow up plan will be scheduled in approximately 7 days for incision check and XR.  Post-Op Diagnosis: Same Surgeons:Primary: Hiram Gash, MD Assistants:Caroline McBane PA-C Location: [place] 05 Anesthesia: General with regional anesthesia Antibiotics: Clindamycin with vancomycin locally Tourniquet time: 100 Estimated Blood Loss: Minimal Complications: None Specimens: None Implants: Implant Name Type Inv. Item Serial No. Manufacturer Lot No. LRB No. Used Action  PLATE LOCK RT SM - [id number] Plate PLATE LOCK RT SM  [employer](ORTH,TRAU,BIO,SG)  Right 1 Implanted  PLATE LOCK RT SM - [id number] Plate PLATE LOCK RT SM  [employer](ORTH,TRAU,BIO,SG)  Right 1 Implanted  WASHER 3.5MM - [id number] Orthopedic Implant WASHER 3.5MM  [employer](ORTH,TRAU,BIO,SG)  Right 3 Implanted  SCREW CORTICAL 3.5X30MM -  [id number] Screw SCREW CORTICAL 3.5X30MM  [employer](ORTH,TRAU,BIO,SG)  Right 2 Implanted  SCREW NON LOCKING LP 3.5 16MM - [id number] Screw SCREW NON LOCKING LP 3.5 16MM  [employer](ORTH,TRAU,BIO,SG)  Right 1 Implanted  SCREW LOW PROFILE 18MMX3.5MM - [id number] Screw SCREW LOW PROFILE 18MMX3.5MM  [employer](ORTH,TRAU,BIO,SG)  Right 1 Implanted  SCREW LOCK CORT STAR 3.5X22 - [id number] Screw SCREW LOCK CORT STAR 3.5X22  [employer](ORTH,TRAU,BIO,SG)  Right 1 Implanted  SCREW LOCK CORT STAR 3.5X38 - [id number] Screw SCREW LOCK CORT STAR 3.5X38  [employer](ORTH,TRAU,BIO,SG)  Right 1 Implanted  SCREW LOCK CORT STAR 3.5X12 - [id number] Screw SCREW LOCK CORT STAR 3.5X12  [employer](ORTH,TRAU,BIO,SG)  Right 1 Implanted  SCREW LOCK CORT STAR 3.5X18 - [id number] Screw SCREW LOCK CORT STAR 3.5X18  [employer](ORTH,TRAU,BIO,SG)  Right 1 Implanted  SCREW LOCK CORT STAR 3.5X28 - [id number] Screw SCREW LOCK CORT STAR 3.5X28  [employer](ORTH,TRAU,BIO,SG)  Right 1 Implanted  SCREW LOCK 3.5X36 DIST TIB - [id number] Screw SCREW LOCK 3.5X36 DIST TIB  [employer](ORTH,TRAU,BIO,SG)  Right 1 Implanted    Indications for Surgery:   Brandy Bishop is a 59 y.o. female with fall resulting in a displaced supracondylar humerus fracture without intra-articular extension seen by one of my partners and referred to me for further definitive care.  Patient has significant medical comorbidities including polysubstance abuse and alcohol use.  Unfortunately she had an unstable fracture and we felt that surgical management will help with mobilization as well as prevention of a nonunion.  She was at high risk for perioperative complications secondary to her nutritional status and her substance abuse.  Benefits and risks of operative and nonoperative management were discussed  prior to surgery with patient/guardian(s) and informed consent form was completed.  Specific risks including infection, need for additional surgery, stiffness,  nonunion, malunion, nerve and periprosthetic injury   Procedure:   The patient was identified properly. Informed consent was obtained and the surgical site was marked. The patient was taken up to suite where general anesthesia was induced.  The patient was positioned lateral on a beanbag with arm over radiolucent sure foot positioner.  The right elbow was prepped and draped in the usual sterile fashion.  Timeout was performed before the beginning of the case.  Tourniquet was used for the above duration.  We began with a longitudinal posterior approach to the elbow.  Full-thickness skin flaps were obtained and hemostasis was obtained as we progressed.  We identified and lifted flaps laterally and then carefully identified the ulnar nerve medially and were able to mobilize and protect the nerve with blunt retractors.  That point were able to subcutaneously temporarily transpose the nerve and complete her approach peritricipital with triceps on to the medial side of the joint.  We used direct maneuvers to reduce the fracture and placed a K wire in crossing fashion to hold our reduction.  We then provisionally placed a direct medial Biomet plate before checking its position of fluoroscopy.  Once we were satisfied with our position we placed screws proximally and distally obtaining appropriate purchase with each.  Fluoroscopic guidance was used to avoid entry into the joint.  We were happy with our medial sided fixation and turned our attention to the lateral side.  A lateral peritricipital approach was used to identify the posterior aspect of the humerus.  We then used similar image guidance to place our posterior lateral plate.  We left the distal tabs of the Biomet plate on assuming we would be able to get reasonable fixation of these however after the plate was positioned we opted to avoid use of these distal fragments as they were too close to the joint.  At that point it we thought it was not in the  patient's best interest to remove the plate completely remove these items and then replaced the plate and thus check range of motion operative leave these in place.  If this was symptomatic we may decide to remove her hardware at a later date.  Final fluoroscopic imaging demonstrated anatomic reduction of the fracture and good fixation.  We closed the lateral peritricipital approach after irrigation and then carefully placed the ulnar nerve back in its bed and noted that was completely free of impingement and then loosely closed the medial para tricipital approach without binding the nerve.  We irrigated the wound copiously before placing local antibiotic as listed above.  We closed the incision in a multilayer fashion with absorbable suture.  Sterile dressing was placed.  Well molded well-padded posterior slab splint was placed.  Patient was awoken taken to PACU in stable condition.  Noemi Chapel, PA-C, present and scrubbed throughout the case, critical for completion in a timely fashion, and for retraction, instrumentation, closure.

## 2019-05-12 NOTE — Anesthesia Postprocedure Evaluation (Signed)
Anesthesia Post Note  Patient: CHAUNTEL GORSUCH  Procedure(s) Performed: OPEN REDUCTION INTERNAL FIXATION (ORIF) DISTAL HUMERUS FRACTURE (Right )     Patient location during evaluation: PACU Anesthesia Type: General and Regional Level of consciousness: awake and alert, oriented and patient cooperative Pain management: pain level controlled Vital Signs Assessment: post-procedure vital signs reviewed and stable Respiratory status: spontaneous breathing, nonlabored ventilation and respiratory function stable Cardiovascular status: blood pressure returned to baseline and stable Postop Assessment: no apparent nausea or vomiting Anesthetic complications: no    Last Vitals:  Vitals:   05/12/19 1615 05/12/19 1640  BP: (!) 153/95 (!) 161/100  Pulse: 86 84  Resp: (!) 21 20  Temp:  37.1 C  SpO2: 100% 100%    Last Pain:  Vitals:   05/12/19 1640  TempSrc:   PainSc: 0-No pain                 Ezra Denne,E. Aminat [place]

## 2019-05-12 NOTE — Interval H&P Note (Signed)
All questions answered. Patient is it extremely high risk of complications due to her substance-abuse and overall health however she has an unstable fracture. We think that surgical management is in her best interest. She agrees to proceed.

## 2019-05-17 ENCOUNTER — Encounter: Payer: Self-pay | Admitting: *Deleted

## 2019-07-30 ENCOUNTER — Emergency Department (HOSPITAL_COMMUNITY): Payer: Medicaid Other

## 2019-07-30 ENCOUNTER — Emergency Department (HOSPITAL_COMMUNITY)
Admission: EM | Admit: 2019-07-30 | Discharge: 2019-07-30 | Disposition: A | Discharge status: Home / Self Care | Payer: Medicaid Other | Attending: Emergency Medicine | Admitting: Emergency Medicine

## 2019-07-30 DIAGNOSIS — R0789 Other chest pain: Secondary | ICD-10-CM | POA: Diagnosis not present

## 2019-07-30 DIAGNOSIS — R569 Unspecified convulsions: Secondary | ICD-10-CM | POA: Diagnosis not present

## 2019-07-30 DIAGNOSIS — Z79899 Other long term (current) drug therapy: Secondary | ICD-10-CM | POA: Insufficient documentation

## 2019-07-30 DIAGNOSIS — F1010 Alcohol abuse, uncomplicated: Secondary | ICD-10-CM | POA: Insufficient documentation

## 2019-07-30 DIAGNOSIS — K219 Gastro-esophageal reflux disease without esophagitis: Secondary | ICD-10-CM | POA: Insufficient documentation

## 2019-07-30 DIAGNOSIS — Y906 Blood alcohol level of 120-199 mg/100 ml: Secondary | ICD-10-CM | POA: Diagnosis not present

## 2019-07-30 DIAGNOSIS — J45909 Unspecified asthma, uncomplicated: Secondary | ICD-10-CM | POA: Diagnosis not present

## 2019-07-30 DIAGNOSIS — I10 Essential (primary) hypertension: Secondary | ICD-10-CM | POA: Diagnosis not present

## 2019-07-30 LAB — COMPREHENSIVE METABOLIC PANEL
ALT: 27 U/L (ref 0–44)
AST: 90 U/L — ABNORMAL HIGH (ref 15–41)
Albumin: 3.2 g/dL — ABNORMAL LOW (ref 3.5–5.0)
Alkaline Phosphatase: 86 U/L (ref 38–126)
Anion gap: 16 — ABNORMAL HIGH (ref 5–15)
BUN: <5 mg/dL — ABNORMAL LOW (ref 6–20)
CO2: 20 mmol/L — ABNORMAL LOW (ref 22–32)
Calcium: 8.3 mg/dL — ABNORMAL LOW (ref 8.9–10.3)
Chloride: 102 mmol/L (ref 98–111)
Creatinine, Ser: 0.56 mg/dL (ref 0.44–1.00)
GFR calc Af Amer: >60 mL/min (ref >60)
GFR calc non Af Amer: >60 mL/min (ref >60)
Glucose, Bld: 75 mg/dL (ref 70–99)
Potassium: 3.8 mmol/L (ref 3.5–5.1)
Sodium: 138 mmol/L (ref 135–145)
Total Bilirubin: 1.1 mg/dL (ref 0.3–1.2)
Total Protein: 6.2 g/dL — ABNORMAL LOW (ref 6.5–8.1)

## 2019-07-30 LAB — LIPASE, BLOOD: Lipase: 14 U/L (ref 11–51)

## 2019-07-30 LAB — TROPONIN I (HIGH SENSITIVITY): Troponin I (High Sensitivity): 4 ng/L (ref <18)

## 2019-07-30 LAB — ETHANOL: Alcohol, Ethyl (B): 168 mg/dL — ABNORMAL HIGH (ref <10)

## 2019-07-30 LAB — PHENYTOIN LEVEL, TOTAL: Phenytoin Lvl: <2.5 ug/mL — ABNORMAL LOW (ref 10.0–20.0)

## 2019-07-30 MED ORDER — ALUM & MAG HYDROXIDE-SIMETH 200-200-20 MG/5ML PO SUSP
30.0000 mL | Freq: Once | ORAL | Status: AC
  Administered 2019-07-30: 30 mL via ORAL
  Filled 2019-07-30: qty 30

## 2019-07-30 MED ORDER — FAMOTIDINE IN NACL 20-0.9 MG/50ML-% IV SOLN: 20.0000 mg | Freq: Once | INTRAVENOUS | Status: DC

## 2019-07-30 MED ORDER — SUCRALFATE 1 GM/10ML PO SUSP: 1.0000 g | Freq: Three times a day (TID) | ORAL | 0 refills | Status: DC

## 2019-07-30 MED ORDER — FAMOTIDINE 20 MG PO TABS
20.0000 mg | ORAL_TABLET | Freq: Once | ORAL | Status: AC
  Administered 2019-07-30: 20 mg via ORAL
  Filled 2019-07-30: qty 1

## 2019-07-30 MED ORDER — LIDOCAINE VISCOUS HCL 2 % MT SOLN
15.0000 mL | Freq: Once | OROMUCOSAL | Status: AC
  Administered 2019-07-30: 15 mL via ORAL
  Filled 2019-07-30: qty 15

## 2019-07-30 MED ORDER — OMEPRAZOLE 20 MG PO CPDR: 20.0000 mg | DELAYED_RELEASE_CAPSULE | Freq: Every day | ORAL | 0 refills | Status: DC

## 2019-07-30 NOTE — ED Triage Notes (Addendum)
Patient from home with [facility] for sharp centeral chest pain x3 days. Denies SOB, dizziness, denies radiation of pain. Received 1x SL NTG en route, denies change in pain. Received 324 mg aspirin from EMS. Refused IV. Patient alert, oriented, and in no apparent distress at this time. States she drinks beer almost every day.

## 2019-07-30 NOTE — ED Provider Notes (Signed)
[facility] EMERGENCY DEPARTMENT Provider Note   CSN: [id number] Arrival date & time: 07/30/19  6644     History Chief Complaint  Patient presents with  . Chest Pain    Brandy Bishop is a 59 y.o. female.  The history is provided by the patient and medical records. No language interpreter was used.  Chest Pain  Brandy Bishop is a 59 y.o. female who presents to the Emergency Department complaining of chest pain. She presents the emergency department by EMS complaining of sharp and central chest pain that began three days ago. Pain is constant and radiates to her throat. It is worse when she goes to lay down at night but resolved when she sleeps. She has experienced one episode of emesis that appeared like the food that she ate. She has experienced mild diarrhea. No blocker bloody stools. She does have night sweats. She has mild abdominal discomfort. No shortness of breath, leg swelling. She has a history of hypertension and seizure disorder. She takes Dilantin for her seizures. She received 324 mg of aspirin by EMS. She did have one nitroglycerin with no significant change in her symptoms. She does not smoke or use street drugs. She does drink alcohol and refuses to describe the amount of alcohol that she drinks.    Past Medical History:  Diagnosis Date  . Alcoholism ([place]) 2011  . Asthma   . Chronic lower back pain   . Colitis 04/2014.   Bloody diarrhea  . Daily headache   . GIB (gastrointestinal bleeding)   . Hypertension   . Pancreatitis    Chronic pancreatitis noted on CT scan in 04/2014.  Marland Kitchen Polysubstance abuse ([place]) 2011  . Pyelonephritis 05/2012  . Seizures ([place])    last seizure was 04/2019 per patient     Patient Active Problem List   Diagnosis Date Noted  . Gastroesophageal reflux disease with esophagitis   . Seizure ([place]) 05/30/2015  . Cough   . Subtherapeutic phenytoin level   . Acute upper GI bleed   . Hypomagnesemia 04/16/2014  .  Hypokalemia 04/16/2014  . Thrombocytopenia ([place]) 04/16/2014  . Hepatic steatosis 04/15/2014  . Alcohol abuse 04/15/2014  . Chronic pancreatitis ([place]) 04/15/2014  . Protein-calorie malnutrition, severe ([place]) 04/15/2014  . Severe protein-calorie malnutrition ([place]) 04/15/2014  . Bleeding gastrointestinal   . Abnormal CT scan, colon   . Rectal bleeding   . Abdominal pain   . Alcoholic hepatitis without ascites   . GI bleed 04/14/2014  . Viral URI with cough 05/07/2012  . Pyelonephritis 05/07/2012  . Seizure disorder ([place]) 05/07/2012  . Asthma with bronchitis 05/07/2012  . HTN (hypertension) 05/07/2012    Past Surgical History:  Procedure Laterality Date  . ESOPHAGOGASTRODUODENOSCOPY N/A 05/31/2015   Procedure: ESOPHAGOGASTRODUODENOSCOPY (EGD);  Surgeon: Irene Shipper, MD;  Location[facility] ENDOSCOPY;  Service: Endoscopy;  Laterality: N/A;  . NO PAST SURGERIES    . ORIF HUMERUS FRACTURE Right 05/12/2019   Procedure: OPEN REDUCTION INTERNAL FIXATION (ORIF) DISTAL HUMERUS FRACTURE;  Surgeon: Hiram Gash, MD;  Location: [facility] ORS;  Service: Orthopedics;  Laterality: Right;     OB History   No obstetric history on file.     Family History  Problem Relation Age of Onset  . Cancer Mother   . Cancer Brother     Social History   Tobacco Use  . Smoking status: Never Smoker  . Smokeless tobacco: Never Used  Vaping Use  . Vaping Use: Never used  Substance Use Topics  . Alcohol use: Yes    Alcohol/week: 10.0 standard drinks    Types: 10 Cans of beer per week  . Drug use: No    Home Medications Prior to Admission medications   Medication Sig Start Date End Date Taking? Authorizing Provider  acetaminophen (TYLENOL) 500 MG tablet Take 1,000 mg by mouth every 6 (six) hours as needed for mild pain.   Yes [provider]  albuterol (PROVENTIL HFA;VENTOLIN HFA) 108 (90 Base) MCG/ACT inhaler Inhale 1-2 puffs into the lungs every 6 (six) hours as needed for wheezing or shortness of  breath.   Yes [provider]  betamethasone valerate ointment (VALISONE) 0.1 % Apply 1 application topically 2 (two) times daily as needed (itching).   Yes [provider]  FLOVENT HFA 110 MCG/ACT inhaler Take 2 puffs by mouth 2 (two) times daily. 11/05/17  Yes [provider]  hydrochlorothiazide (HYDRODIURIL) 25 MG tablet Take 25 mg by mouth daily. 10/18/17  Yes [provider]  hydrOXYzine (ATARAX/VISTARIL) 25 MG tablet Take 25 mg by mouth at bedtime. 04/05/19  Yes [provider]  triamcinolone ointment (KENALOG) 0.1 % Apply 1 application topically 2 (two) times daily. 05/20/19  Yes [provider]  atenolol (TENORMIN) 25 MG tablet Take 2 tablets (50 mg total) by mouth daily. Patient not taking: Reported on 05/07/2019 10/15/14   [name], NP  ferrous sulfate 325 (65 FE) MG tablet Take 1 tablet (325 mg total) by mouth 2 (two) times daily with a meal. Patient not taking: Reported on 05/07/2019 06/08/17   Virgel Manifold, MD  gabapentin (NEURONTIN) 100 MG capsule Take 1 capsule (100 mg total) by mouth 2 (two) times daily for 14 days. For pain. 05/12/19 05/26/19  Ethelda Chick, PA-C  omeprazole (PRILOSEC) 20 MG capsule Take 1 capsule (20 mg total) by mouth daily. 07/30/19   Quintella Reichert, MD  phenytoin (DILANTIN) 300 MG ER capsule Take 1 capsule (300 mg total) by mouth at bedtime. Patient not taking: Reported on 07/30/2019 06/01/15   Robbie Lis, MD  sucralfate (CARAFATE) 1 GM/10ML suspension Take 10 mLs (1 g total) by mouth 4 (four) times daily -  with meals and at bedtime. 07/30/19   Quintella Reichert, MD    Allergies    Penicillins  Review of Systems   Review of Systems  Cardiovascular: Positive for chest pain.  All other systems reviewed and are negative.   Physical Exam Updated Vital Signs BP 139/86   Pulse 79   Temp 98.2 F (36.8 C) (Oral)   Resp 15   SpO2 100%   Physical Exam Vitals and nursing note reviewed.  Constitutional:       Appearance: She is well-developed.  HENT:     Head: Normocephalic and atraumatic.  Cardiovascular:     Rate and Rhythm: Normal rate and regular rhythm.     Heart sounds: No murmur heard.   Pulmonary:     Effort: Pulmonary effort is normal. No respiratory distress.     Breath sounds: Normal breath sounds.  Abdominal:     Palpations: Abdomen is soft.     Tenderness: There is no guarding or rebound.     Comments: Mild abdominal tenderness  Musculoskeletal:        General: No swelling or tenderness.  Skin:    General: Skin is warm and dry.  Neurological:     Mental Status: She is alert and oriented to person, place, and time.  Psychiatric:  Behavior: Behavior normal.     ED Results / Procedures / Treatments   Labs (all labs ordered are listed, but only abnormal results are displayed) Labs Reviewed  COMPREHENSIVE METABOLIC PANEL - Abnormal; Notable for the following components:      Result Value   CO2 20 (*)    BUN <5 (*)    Calcium 8.3 (*)    Total Protein 6.2 (*)    Albumin 3.2 (*)    AST 90 (*)    Anion gap 16 (*)    All other components within normal limits  ETHANOL - Abnormal; Notable for the following components:   Alcohol, Ethyl (B) 168 (*)    All other components within normal limits  PHENYTOIN LEVEL, TOTAL - Abnormal; Notable for the following components:   Phenytoin Lvl <2.5 (*)    All other components within normal limits  LIPASE, BLOOD  CBC WITH DIFFERENTIAL/PLATELET  TROPONIN I (HIGH SENSITIVITY)  TROPONIN I (HIGH SENSITIVITY)    EKG None  Radiology DG Chest 2 View  Result Date: 07/30/2019 CLINICAL DATA:  Central chest pain for 3 days EXAM: CHEST - 2 VIEW COMPARISON:  02/09/2018 FINDINGS: Cardiomediastinal contours are stable.  Hilar structures are normal. Lungs are clear.  No sign of pleural effusion. Limited assessment visualized skeletal structures without acute process. IMPRESSION: No acute cardiopulmonary disease. Electronically Signed    By: Zetta Bills M.D.   On: 07/30/2019 11:07    Procedures Procedures (including critical care time)  Medications Ordered in ED Medications  alum & mag hydroxide-simeth (MAALOX/MYLANTA) 200-200-20 MG/5ML suspension 30 mL (30 mLs Oral Given 07/30/19 1109)    And  lidocaine (XYLOCAINE) 2 % viscous mouth solution 15 mL (15 mLs Oral Given 07/30/19 1109)  famotidine (PEPCID) tablet 20 mg (20 mg Oral Given 07/30/19 1109)    ED Course  I have reviewed the triage vital signs and the nursing notes.  Pertinent labs & imaging results that were available during my care of the patient were reviewed by me and considered in my medical decision making (see chart for details).    MDM Rules/Calculators/A&P                         Patient with hypertension, alcohol abuse, seizure disorder here for evaluation of chest pain for three days. She is non-toxic appearing on evaluation. EKG with no acute ischemic changes. Initial troponin is negative. Patient has set study pain for three days, do not feel that additional troponin is needed at this time. Patient CBC human lives, recommend rechecking CBC but patient refuses any additional IV sticks. Discussed that because of her history of anemia do recommend that this is checked and she continues to decline additional blood draws. Her pain is improved after G.I. cocktail and Pepcid. Presentation is not consistent with ACS, dissection, PE, pneumonia, pancreatitis. Discussed with patient home care for reflux. Recommend decreasing her alcohol intake, outpatient follow-up and return precautions.  Final Clinical Impression(s) / ED Diagnoses Final diagnoses:  Atypical chest pain  Gastroesophageal reflux disease, unspecified whether esophagitis present    Rx / DC Orders ED Discharge Orders         Ordered    omeprazole (PRILOSEC) 20 MG capsule  Daily     Discontinue  Reprint     07/30/19 1354    sucralfate (CARAFATE) 1 GM/10ML suspension  3 times daily with meals &  bedtime     Discontinue  Reprint  07/30/19 1354           Quintella Reichert, MD 07/30/19 1358

## 2019-09-08 ENCOUNTER — Observation Stay (HOSPITAL_COMMUNITY)
Admission: EM | Admit: 2019-09-08 | Discharge: 2019-09-09 | Disposition: A | Discharge status: Home / Self Care | Payer: Medicaid Other | Attending: Internal Medicine | Admitting: Internal Medicine

## 2019-09-08 ENCOUNTER — Emergency Department (HOSPITAL_COMMUNITY): Payer: Medicaid Other

## 2019-09-08 ENCOUNTER — Other Ambulatory Visit: Payer: Self-pay

## 2019-09-08 ENCOUNTER — Encounter (HOSPITAL_COMMUNITY): Payer: Self-pay | Admitting: Emergency Medicine

## 2019-09-08 DIAGNOSIS — Z20822 Contact with and (suspected) exposure to covid-19: Secondary | ICD-10-CM | POA: Diagnosis not present

## 2019-09-08 DIAGNOSIS — I10 Essential (primary) hypertension: Secondary | ICD-10-CM | POA: Diagnosis not present

## 2019-09-08 DIAGNOSIS — K922 Gastrointestinal hemorrhage, unspecified: Principal | ICD-10-CM | POA: Diagnosis present

## 2019-09-08 DIAGNOSIS — K635 Polyp of colon: Secondary | ICD-10-CM | POA: Insufficient documentation

## 2019-09-08 DIAGNOSIS — K921 Melena: Secondary | ICD-10-CM | POA: Diagnosis not present

## 2019-09-08 DIAGNOSIS — K640 First degree hemorrhoids: Secondary | ICD-10-CM | POA: Diagnosis not present

## 2019-09-08 DIAGNOSIS — J45909 Unspecified asthma, uncomplicated: Secondary | ICD-10-CM | POA: Insufficient documentation

## 2019-09-08 DIAGNOSIS — Z79899 Other long term (current) drug therapy: Secondary | ICD-10-CM | POA: Diagnosis not present

## 2019-09-08 DIAGNOSIS — F1920 Other psychoactive substance dependence, uncomplicated: Secondary | ICD-10-CM | POA: Insufficient documentation

## 2019-09-08 DIAGNOSIS — G40909 Epilepsy, unspecified, not intractable, without status epilepticus: Secondary | ICD-10-CM

## 2019-09-08 DIAGNOSIS — F1910 Other psychoactive substance abuse, uncomplicated: Secondary | ICD-10-CM | POA: Clinically undetermined

## 2019-09-08 LAB — SARS CORONAVIRUS 2 BY RT PCR (HOSPITAL ORDER, PERFORMED IN ~~LOC~~ HOSPITAL LAB): SARS Coronavirus 2: NEGATIVE

## 2019-09-08 LAB — COMPREHENSIVE METABOLIC PANEL
ALT: 27 U/L (ref 0–44)
AST: 61 U/L — ABNORMAL HIGH (ref 15–41)
Albumin: 2.9 g/dL — ABNORMAL LOW (ref 3.5–5.0)
Alkaline Phosphatase: 128 U/L — ABNORMAL HIGH (ref 38–126)
Anion gap: 6 (ref 5–15)
BUN: 9 mg/dL (ref 6–20)
CO2: 27 mmol/L (ref 22–32)
Calcium: 8.9 mg/dL (ref 8.9–10.3)
Chloride: 107 mmol/L (ref 98–111)
Creatinine, Ser: 0.49 mg/dL (ref 0.44–1.00)
GFR calc Af Amer: >60 mL/min (ref >60)
GFR calc non Af Amer: >60 mL/min (ref >60)
Glucose, Bld: 70 mg/dL (ref 70–99)
Potassium: 3.4 mmol/L — ABNORMAL LOW (ref 3.5–5.1)
Sodium: 140 mmol/L (ref 135–145)
Total Bilirubin: 0.7 mg/dL (ref 0.3–1.2)
Total Protein: 6.4 g/dL — ABNORMAL LOW (ref 6.5–8.1)

## 2019-09-08 LAB — TROPONIN I (HIGH SENSITIVITY)
Troponin I (High Sensitivity): 7 ng/L (ref <18)
Troponin I (High Sensitivity): 6 ng/L (ref <18)

## 2019-09-08 LAB — CBC WITH DIFFERENTIAL/PLATELET
Abs Immature Granulocytes: 0 10*3/uL (ref 0.00–0.07)
Basophils Absolute: 0 10*3/uL (ref 0.0–0.1)
Basophils Relative: 0 %
Eosinophils Absolute: 0.2 10*3/uL (ref 0.0–0.5)
Eosinophils Relative: 2 %
HCT: 23.6 % — ABNORMAL LOW (ref 36.0–46.0)
Hemoglobin: 7.2 g/dL — ABNORMAL LOW (ref 12.0–15.0)
Lymphocytes Relative: 24 %
Lymphs Abs: 2.4 10*3/uL (ref 0.7–4.0)
MCH: 27.8 pg (ref 26.0–34.0)
MCHC: 30.5 g/dL (ref 30.0–36.0)
MCV: 91.1 fL (ref 80.0–100.0)
Monocytes Absolute: 0.3 10*3/uL (ref 0.1–1.0)
Monocytes Relative: 3 %
Neutro Abs: 7 10*3/uL (ref 1.7–7.7)
Neutrophils Relative %: 71 %
Platelets: 314 10*3/uL (ref 150–400)
RBC: 2.59 MIL/uL — ABNORMAL LOW (ref 3.87–5.11)
RDW: 24.1 % — ABNORMAL HIGH (ref 11.5–15.5)
WBC: 9.8 10*3/uL (ref 4.0–10.5)
nRBC: 0 % (ref 0.0–0.2)
nRBC: 0 /100 WBC

## 2019-09-08 LAB — PROTIME-INR
INR: 1.1 (ref 0.8–1.2)
Prothrombin Time: 13.6 seconds (ref 11.4–15.2)

## 2019-09-08 LAB — PREPARE RBC (CROSSMATCH)

## 2019-09-08 LAB — POC OCCULT BLOOD, ED: Fecal Occult Bld: POSITIVE — AB

## 2019-09-08 MED ORDER — ACETAMINOPHEN 325 MG PO TABS: 650.0000 mg | ORAL_TABLET | Freq: Four times a day (QID) | ORAL | Status: DC | PRN

## 2019-09-08 MED ORDER — ADULT MULTIVITAMIN W/MINERALS CH
1.0000 | ORAL_TABLET | Freq: Every day | ORAL | Status: DC
  Administered 2019-09-08 – 2019-09-09 (×2): 1 via ORAL
  Filled 2019-09-08 (×2): qty 1

## 2019-09-08 MED ORDER — ONDANSETRON HCL 4 MG PO TABS: 4.0000 mg | ORAL_TABLET | Freq: Four times a day (QID) | ORAL | Status: DC | PRN

## 2019-09-08 MED ORDER — SODIUM CHLORIDE 0.9 % IV SOLN: INTRAVENOUS | Status: DC

## 2019-09-08 MED ORDER — LORAZEPAM 1 MG PO TABS: 1.0000 mg | ORAL_TABLET | ORAL | Status: DC | PRN

## 2019-09-08 MED ORDER — AMLODIPINE BESYLATE 10 MG PO TABS
10.0000 mg | ORAL_TABLET | Freq: Every day | ORAL | Status: DC
  Administered 2019-09-08 – 2019-09-09 (×2): 10 mg via ORAL
  Filled 2019-09-08 (×2): qty 1
  Filled 2019-09-08: qty 2

## 2019-09-08 MED ORDER — PANTOPRAZOLE SODIUM 40 MG IV SOLR
40.0000 mg | Freq: Once | INTRAVENOUS | Status: AC
  Administered 2019-09-08: 40 mg via INTRAVENOUS
  Filled 2019-09-08: qty 40

## 2019-09-08 MED ORDER — ALBUTEROL SULFATE (2.5 MG/3ML) 0.083% IN NEBU: 3.0000 mL | INHALATION_SOLUTION | Freq: Four times a day (QID) | RESPIRATORY_TRACT | Status: DC | PRN

## 2019-09-08 MED ORDER — LORAZEPAM 2 MG/ML IJ SOLN: 1.0000 mg | INTRAMUSCULAR | Status: DC | PRN

## 2019-09-08 MED ORDER — ACETAMINOPHEN 650 MG RE SUPP: 650.0000 mg | Freq: Four times a day (QID) | RECTAL | Status: DC | PRN

## 2019-09-08 MED ORDER — PANTOPRAZOLE SODIUM 40 MG IV SOLR
40.0000 mg | Freq: Two times a day (BID) | INTRAVENOUS | Status: DC
  Administered 2019-09-08 – 2019-09-09 (×2): 40 mg via INTRAVENOUS
  Filled 2019-09-08 (×2): qty 40

## 2019-09-08 MED ORDER — PEG 3350-KCL-NA BICARB-NACL 420 G PO SOLR
4000.0000 mL | Freq: Once | ORAL | Status: AC
  Administered 2019-09-08: 4000 mL via ORAL
  Filled 2019-09-08: qty 4000

## 2019-09-08 MED ORDER — ONDANSETRON HCL 4 MG/2ML IJ SOLN: 4.0000 mg | Freq: Four times a day (QID) | INTRAMUSCULAR | Status: DC | PRN

## 2019-09-08 MED ORDER — SUCRALFATE 1 GM/10ML PO SUSP
1.0000 g | Freq: Three times a day (TID) | ORAL | Status: DC
  Filled 2019-09-08 (×2): qty 10

## 2019-09-08 MED ORDER — THIAMINE HCL 100 MG/ML IJ SOLN
100.0000 mg | Freq: Every day | INTRAMUSCULAR | Status: DC
  Filled 2019-09-08: qty 2

## 2019-09-08 MED ORDER — THIAMINE HCL 100 MG PO TABS
100.0000 mg | ORAL_TABLET | Freq: Every day | ORAL | Status: DC
  Administered 2019-09-08 – 2019-09-09 (×2): 100 mg via ORAL
  Filled 2019-09-08 (×2): qty 1

## 2019-09-08 MED ORDER — HYDROXYZINE HCL 25 MG PO TABS
25.0000 mg | ORAL_TABLET | Freq: Every day | ORAL | Status: DC
  Administered 2019-09-08: 25 mg via ORAL
  Filled 2019-09-08: qty 1

## 2019-09-08 MED ORDER — MORPHINE SULFATE (PF) 4 MG/ML IV SOLN
4.0000 mg | Freq: Once | INTRAVENOUS | Status: AC
  Administered 2019-09-08: 4 mg via INTRAVENOUS
  Filled 2019-09-08: qty 1

## 2019-09-08 MED ORDER — FOLIC ACID 1 MG PO TABS
1.0000 mg | ORAL_TABLET | Freq: Every day | ORAL | Status: DC
  Administered 2019-09-08 – 2019-09-09 (×2): 1 mg via ORAL
  Filled 2019-09-08 (×2): qty 1

## 2019-09-08 MED ORDER — SODIUM CHLORIDE 0.9% IV SOLUTION: Freq: Once | INTRAVENOUS | Status: AC

## 2019-09-08 NOTE — ED Provider Notes (Signed)
[facility]Y DE[facility]MENT Provider Note   CSN: [id number] Arrival date & time: 09/08/19  8588     History Chief Complaint  Patient presents with   GI Bleeding    [facility] is a 59 y.o. female.  HPI   59 year old female with a history of alcoholism, asthma, chronic back pain, colitis, gastrointestinal bleeding, hypertension, chronic pancreatitis, polysubstance abuse, pyelonephritis, seizures, who presents to the emergency department today for evaluation of bloody stools. States this AM she tried to have a BM and noted that it was very hard so she had to strain. Following this there was some dark red blood in the commode and on her toilet paper. She reports only having one episode of bleeding. Additionally, she reports some periumbilical abd pain. Rates pain 3-4/10. Denies nausea, vomiting, hematemesis. Denies lightheadedness/dizziness or black/tarry stools. Reports some mild chest pain.   States she takes naproxen 1-2 times per week for chronic back pain. States she drinks ETOH on the weekends only.   Reviewed endoscopy from 05/31/2015 with Dr. [name] with [facility]. Results as follows,  "- LA Grade C reflux esophagitis. This may explain dark stools. No varices - Normal stomach. No varices - Normal examined duodenum."  Past Medical History:  Diagnosis Date   Alcoholism ([place]) 2011   Asthma    Chronic lower back pain    Colitis 04/2014.   Bloody diarrhea   Daily headache    GIB (gastrointestinal bleeding)    Hypertension    Pancreatitis    Chronic pancreatitis noted on CT scan in 04/2014.   Polysubstance abuse ([place]) 2011   Pyelonephritis 05/2012   Seizures ([place])    last seizure was 04/2019 per patient     Patient Active Problem List   Diagnosis Date Noted   Gastroesophageal reflux disease with esophagitis    Seizure ([place]) 05/30/2015   Cough    Subtherapeutic phenytoin level    Acute upper GI bleed    Hypomagnesemia  04/16/2014   Hypokalemia 04/16/2014   Thrombocytopenia ([place]) 04/16/2014   Hepatic steatosis 04/15/2014   Alcohol abuse 04/15/2014   Chronic pancreatitis ([place]) 04/15/2014   Protein-calorie malnutrition, severe ([place]) 04/15/2014   Severe protein-calorie malnutrition ([place]) 04/15/2014   Bleeding gastrointestinal    Abnormal CT scan, colon    Rectal bleeding    Abdominal pain    Alcoholic hepatitis without ascites    GI bleed 04/14/2014   Viral URI with cough 05/07/2012   Pyelonephritis 05/07/2012   Seizure disorder ([place]) 05/07/2012   Asthma with bronchitis 05/07/2012   HTN (hypertension) 05/07/2012    Past Surgical History:  Procedure Laterality Date   ESOPHAGOGASTRODUODENOSCOPY N/A 05/31/2015   Procedure: ESOPHAGOGASTRODUODENOSCOPY (EGD);  Surgeon: Irene Shipper, MD;  Location: [facility] ENDOSCOPY;  Service: Endoscopy;  Laterality: N/A;   NO PAST SURGERIES     ORIF HUMERUS FRACTURE Right 05/12/2019   Procedure: OPEN REDUCTION INTERNAL FIXATION (ORIF) DISTAL HUMERUS FRACTURE;  Surgeon: Hiram Gash, MD;  Location: [facility] ORS;  Service: Orthopedics;  Laterality: Right;     OB History   No obstetric history on file.     Family History  Problem Relation Age of Onset   Cancer Mother    Cancer Brother     Social History   Tobacco Use   Smoking status: Never Smoker   Smokeless tobacco: Never Used  Vaping Use   Vaping Use: Never used  Substance Use Topics   Alcohol use: Yes    Alcohol/week: 10.0  standard drinks    Types: 10 Cans of beer per week   Drug use: No    Home Medications Prior to Admission medications   Medication Sig Start Date End Date Taking? Authorizing Provider  acetaminophen (TYLENOL) 500 MG tablet Take 1,000 mg by mouth every 6 (six) hours as needed for mild pain.    [provider]  albuterol (PROVENTIL HFA;VENTOLIN HFA) 108 (90 Base) MCG/ACT inhaler Inhale 1-2 puffs into the lungs every 6 (six) hours as needed for wheezing or  shortness of breath.    [provider]  atenolol (TENORMIN) 25 MG tablet Take 2 tablets (50 mg total) by mouth daily. Patient not taking: Reported on 05/07/2019 10/15/14   [name], NP  betamethasone valerate ointment (VALISONE) 0.1 % Apply 1 application topically 2 (two) times daily as needed (itching).    [provider]  ferrous sulfate 325 (65 FE) MG tablet Take 1 tablet (325 mg total) by mouth 2 (two) times daily with a meal. Patient not taking: Reported on 05/07/2019 06/08/17   Virgel Manifold, MD  FLOVENT HFA 110 MCG/ACT inhaler Take 2 puffs by mouth 2 (two) times daily. 11/05/17   [provider]  gabapentin (NEURONTIN) 100 MG capsule Take 1 capsule (100 mg total) by mouth 2 (two) times daily for 14 days. For pain. 05/12/19 05/26/19  Ethelda Chick, PA-C  hydrochlorothiazide (HYDRODIURIL) 25 MG tablet Take 25 mg by mouth daily. 10/18/17   [provider]  hydrOXYzine (ATARAX/VISTARIL) 25 MG tablet Take 25 mg by mouth at bedtime. 04/05/19   [provider]  omeprazole (PRILOSEC) 20 MG capsule Take 1 capsule (20 mg total) by mouth daily. 07/30/19   Quintella Reichert, MD  phenytoin (DILANTIN) 300 MG ER capsule Take 1 capsule (300 mg total) by mouth at bedtime. Patient not taking: Reported on 07/30/2019 06/01/15   Robbie Lis, MD  sucralfate (CARAFATE) 1 GM/10ML suspension Take 10 mLs (1 g total) by mouth 4 (four) times daily -  with meals and at bedtime. 07/30/19   Quintella Reichert, MD  triamcinolone ointment (KENALOG) 0.1 % Apply 1 application topically 2 (two) times daily. 05/20/19   [provider]    Allergies    Penicillins  Review of Systems   Review of Systems  Constitutional: Negative for fever.  HENT: Negative for ear pain and sore throat.   Eyes: Negative for visual disturbance.  Respiratory: Negative for cough and shortness of breath.   Cardiovascular: Positive for chest pain.  Gastrointestinal: Positive for abdominal pain, blood  in stool and constipation. Negative for diarrhea, nausea and vomiting.  Genitourinary: Negative for dysuria and hematuria.  Musculoskeletal: Negative for back pain.  Skin: Negative for rash.  Neurological: Negative for headaches.  All other systems reviewed and are negative.   Physical Exam Updated Vital Signs BP (!) 154/89    Pulse 83    Temp 98.9 F (37.2 C) (Oral)    Resp 18    SpO2 100%   Physical Exam Vitals and nursing note reviewed.  Constitutional:      General: She is not in acute distress.    Appearance: She is well-developed.  HENT:     Head: Normocephalic and atraumatic.  Eyes:     Conjunctiva/sclera: Conjunctivae normal.  Cardiovascular:     Rate and Rhythm: Normal rate and regular rhythm.     Heart sounds: Normal heart sounds. No murmur heard.   Pulmonary:     Effort: Pulmonary effort is normal. No  respiratory distress.     Breath sounds: Normal breath sounds. No wheezing, rhonchi or rales.  Abdominal:     General: Bowel sounds are normal.     Palpations: Abdomen is soft.     Tenderness: There is no abdominal tenderness. There is no guarding or rebound.  Genitourinary:    Comments: DRE completed with chaperone at bedside. Firm brown stool mixed with bright red blood on exam. External hemorrhoids present. Musculoskeletal:     Cervical back: Neck supple.  Skin:    General: Skin is warm and dry.  Neurological:     Mental Status: She is alert.     ED Results / Procedures / Treatments   Labs (all labs ordered are listed, but only abnormal results are displayed) Labs Reviewed  COMPREHENSIVE METABOLIC PANEL - Abnormal; Notable for the following components:      Result Value   Potassium 3.4 (*)    Total Protein 6.4 (*)    Albumin 2.9 (*)    AST 61 (*)    Alkaline Phosphatase 128 (*)    All other components within normal limits  CBC WITH DIFFERENTIAL/PLATELET - Abnormal; Notable for the following components:   RBC 2.59 (*)    Hemoglobin 7.2 (*)    HCT  23.6 (*)    RDW 24.1 (*)    All other components within normal limits  POC OCCULT BLOOD, ED - Abnormal; Notable for the following components:   Fecal Occult Bld POSITIVE (*)    All other components within normal limits  SARS CORONAVIRUS 2 BY RT PCR (HOSPITAL ORDER, [place] [facility])  PROTIME-INR  TYPE AND SCREEN  PREPARE RBC (CROSSMATCH)  TROPONIN I (HIGH SENSITIVITY)  TROPONIN I (HIGH SENSITIVITY)    EKG None  Radiology DG Chest Portable 1 View  Result Date: 09/08/2019 CLINICAL DATA:  Chest pain. EXAM: PORTABLE CHEST 1 VIEW COMPARISON:  July 30, 2019. FINDINGS: The heart size and mediastinal contours are within normal limits. Both lungs are clear. No pneumothorax or pleural effusion is noted. The visualized skeletal structures are unremarkable. IMPRESSION: No active disease. Electronically Signed   By: Marijo Conception M.D.   On: 09/08/2019 09:00    Procedures Procedures (including critical care time) CRITICAL CARE Performed by: Rodney Booze   Total critical care time: 33 minutes  Critical care time was exclusive of separately billable procedures and treating other patients.  Critical care was necessary to treat or prevent imminent or life-threatening deterioration.  Critical care was time spent personally by me on the following activities: development of treatment plan with patient and/or surrogate as well as nursing, discussions with consultants, evaluation of patient's response to treatment, examination of patient, obtaining history from patient or surrogate, ordering and performing treatments and interventions, ordering and review of laboratory studies, ordering and review of radiographic studies, pulse oximetry and re-evaluation of patient's condition.   Medications Ordered in ED Medications  0.9 %  sodium chloride infusion (Manually program via Guardrails IV Fluids) (has no administration in time range)  pantoprazole (PROTONIX) injection 40 mg  (40 mg Intravenous Given 09/08/19 0924)  morphine 4 MG/ML injection 4 mg (4 mg Intravenous Given 09/08/19 1107)    ED Course  I have reviewed the triage vital signs and the nursing notes.  Pertinent labs & imaging results that were available during my care of the patient were reviewed by me and considered in my medical decision making (see chart for details).    MDM Rules/Calculators/A&P  59 year old female with history of alcoholism presenting for evaluation of GI bleeding.  Reviewed/interpreted lab CBC without leukocytosis but did show anemia with hemoglobin of 7.2 which is dropped two points over the last 3 months  -Bright red blood noted on rectal exam and Hemoccult is positive.  She not have any melena CMP with mild hypokalemia, otherwise baseline inr wnl trops neg covid pending.   Attempted to call of our GI however they state that patient was never actually seen in [facility] therefore she is technically unassigned.  [facility] has been paged x2  Patient with GI bleed of unknown source at this point.  Given bright red blood, more likely lower GI bleed but she does have risk factors for upper GI bleed.  She was given PPI.  Hemoglobin has dropped two points and is now at 7.2.  With active bleeding, felt that blood transfusion was indicated.  Patient was given 1 unit PRBC.  1:02 PM CONSULT with Paulo Fruit, PA-C with [facility] who will consult on the patient.   1:47 PM CONSULT with Dr. Lorin Mercy who accepts patient for admission  Final Clinical Impression(s) / ED Diagnoses Final diagnoses:  Gastrointestinal hemorrhage, unspecified gastrointestinal hemorrhage type    Rx / DC Orders ED Discharge Orders    None       Rodney Booze, PA-C 09/08/19 1347    Gareth Morgan, MD 09/08/19 [phone]

## 2019-09-08 NOTE — ED Notes (Signed)
Pt is getting blood wont be able to get HIV at this time

## 2019-09-08 NOTE — ED Triage Notes (Addendum)
Pt bib ems with dark red blood in her stool this morning. Hx ulcers. Pt c/o back pain and bil flank pain. 116 palpated HR 100 RR16 99% RA 98.65F

## 2019-09-08 NOTE — H&P (View-Only) (Signed)
Referring Provider: Coral Ceo, PA (ED) Primary Care Physician:  Eston Esters, NP Primary Gastroenterologist: Althia Forts  Reason for Consultation: Rectal bleeding  HPI: [facility] is a 59 y.o. female with history of alcohol use, seizures, GI bleeding, and alcoholic pancreatitis presenting with rectal bleeding.  Patient states she has had several episodes of rectal bleeding this morning.  Blood is bright red and happens anytime she attempts to have a bowel movement.  She reports constipation last night.  Denies any diarrhea.  Has some lower abdominal discomfort but no overt pain.  Patient also reports unintentional weight loss but she cannot quantify the amount of weight she has lost.  Appetite is OK per her report.  Denies nausea, vomiting, hematemesis, dysphagia, GERD.  States she was previously on blood thinner (unsure which one) but states she ran out and has not taken it for over a week.  Denies any aspirin or NSAID use.  She states she drinks 1-3 beers on the weekend but denies daily use.  Denies any family history of colon cancer or gastrointestinal malignancy.  Past Medical History:  Diagnosis Date  . Alcoholism ([place]) 2011  . Asthma   . Chronic lower back pain   . Colitis 04/2014.   Bloody diarrhea  . Daily headache   . GIB (gastrointestinal bleeding)   . Hypertension   . Pancreatitis    Chronic pancreatitis noted on CT scan in 04/2014.  Marland Kitchen Polysubstance abuse ([place]) 2011  . Pyelonephritis 05/2012  . Seizures ([place])    last seizure was 04/2019 per patient     Past Surgical History:  Procedure Laterality Date  . ESOPHAGOGASTRODUODENOSCOPY N/A 05/31/2015   Procedure: ESOPHAGOGASTRODUODENOSCOPY (EGD);  Surgeon: Irene Shipper, MD;  Location: [facility] ENDOSCOPY;  Service: Endoscopy;  Laterality: N/A;  . NO PAST SURGERIES    . ORIF HUMERUS FRACTURE Right 05/12/2019   Procedure: OPEN REDUCTION INTERNAL FIXATION (ORIF) DISTAL HUMERUS FRACTURE;  Surgeon: Hiram Gash, MD;   Location: [facility];  Service: Orthopedics;  Laterality: Right;    Prior to Admission medications   Medication Sig Start Date End Date Taking? Authorizing Provider  albuterol (PROVENTIL HFA;VENTOLIN HFA) 108 (90 Base) MCG/ACT inhaler Inhale 1-2 puffs into the lungs every 6 (six) hours as needed for wheezing or shortness of breath.   Yes [provider]  amLODipine (NORVASC) 10 MG tablet Take 10 mg by mouth daily. 08/13/19  Yes [provider]  hydrOXYzine (ATARAX/VISTARIL) 25 MG tablet Take 25 mg by mouth at bedtime. 04/05/19  Yes [provider]  omeprazole (PRILOSEC) 20 MG capsule Take 1 capsule (20 mg total) by mouth daily. 07/30/19  Yes Quintella Reichert, MD  triamcinolone ointment (KENALOG) 0.1 % Apply 1 application topically 2 (two) times daily as needed (rash).  05/20/19  Yes [provider]  sucralfate (CARAFATE) 1 GM/10ML suspension Take 10 mLs (1 g total) by mouth 4 (four) times daily -  with meals and at bedtime. Patient not taking: Reported on 09/08/2019 07/30/19   Quintella Reichert, MD    Scheduled Meds: . amLODipine  10 mg Oral Daily  . folic acid  1 mg Oral Daily  . hydrOXYzine  25 mg Oral QHS  . multivitamin with minerals  1 tablet Oral Daily  . pantoprazole  40 mg Intravenous Q12H  . sucralfate  1 g Oral TID WC & HS  . thiamine  100 mg Oral Daily   Or  . thiamine  100 mg Intravenous Daily   Continuous Infusions: . sodium  chloride     PRN Meds:.acetaminophen **OR** acetaminophen, albuterol, LORazepam **OR** LORazepam, ondansetron **OR** ondansetron (ZOFRAN) IV  Allergies as of 09/08/2019 - Review Complete 09/08/2019  Allergen Reaction Noted  . Penicillins Swelling 03/15/2011    Family History  Problem Relation Age of Onset  . Cancer Mother   . Cancer Brother     Social History   Socioeconomic History  . Marital status: Single    Spouse name: Not on file  . Number of children: 0  . Years of education: 93  . Highest education level:  Not on file  Occupational History  . Not on file  Tobacco Use  . Smoking status: Never Smoker  . Smokeless tobacco: Never Used  Vaping Use  . Vaping Use: Never used  Substance and Sexual Activity  . Alcohol use: Yes    Alcohol/week: 10.0 standard drinks    Types: 10 Cans of beer per week  . Drug use: No  . Sexual activity: Never  Other Topics Concern  . Not on file  Social History Narrative   Lives in [place].    Disabled due to seizures.    Stay with a friend.    Single. No kids.    Social Determinants of Health   Financial Resource Strain:   . Difficulty of Paying Living Expenses:   Food Insecurity:   . Worried About Charity fundraiser in the Last Year:   . Arboriculturist in the Last Year:   Transportation Needs:   . Film/video editor (Medical):   Marland Kitchen Lack of Transportation (Non-Medical):   Physical Activity:   . Days of Exercise per Week:   . Minutes of Exercise per Session:   Stress:   . Feeling of Stress :   Social Connections:   . Frequency of Communication with Friends and Family:   . Frequency of Social Gatherings with Friends and Family:   . Attends Religious Services:   . Active Member of Clubs or Organizations:   . Attends Archivist Meetings:   Marland Kitchen Marital Status:   Intimate Partner Violence:   . Fear of Current or Ex-Partner:   . Emotionally Abused:   Marland Kitchen Physically Abused:   . Sexually Abused:     Review of Systems: Review of Systems  Constitutional: Positive for malaise/fatigue and weight loss. Negative for chills and fever.  HENT: Negative for hearing loss and tinnitus.   Eyes: Negative for pain and redness.  Respiratory: Negative for cough and shortness of breath.   Cardiovascular: Negative for chest pain and palpitations.  Gastrointestinal: Positive for blood in stool and constipation. Negative for abdominal pain, diarrhea, heartburn, melena, nausea and vomiting.  Genitourinary: Negative for flank pain and hematuria.   Musculoskeletal: Negative for falls and joint pain.  Skin: Negative for itching and rash.  Neurological: Negative for dizziness and loss of consciousness.  Endo/Heme/Allergies: Negative for polydipsia. Does not bruise/bleed easily.  Psychiatric/Behavioral: Positive for substance abuse. The patient is not nervous/anxious.      Physical Exam: Vital signs: Vitals:   09/08/19 1428 09/08/19 1435  BP: (!) 158/100 (!) 156/100  Pulse: 75 73  Resp: 18 17  Temp:  98.8 F (37.1 C)  SpO2: 100%      Physical Exam Constitutional:      General: She is not in acute distress.    Appearance: Normal appearance. She is underweight.  HENT:     Head: Normocephalic and atraumatic.     Nose: Nose normal.  Mouth/Throat:     Mouth: Mucous membranes are moist.     Pharynx: Oropharynx is clear.  Eyes:     General: No scleral icterus.    Extraocular Movements: Extraocular movements intact.     Comments: Conjunctival pallor  Cardiovascular:     Rate and Rhythm: Normal rate and regular rhythm.     Pulses: Normal pulses.     Heart sounds: Normal heart sounds.  Pulmonary:     Effort: Pulmonary effort is normal. No respiratory distress.     Breath sounds: Normal breath sounds.  Abdominal:     General: Bowel sounds are normal. There is no distension.     Palpations: Abdomen is soft. There is no mass.     Tenderness: There is no abdominal tenderness. There is no guarding or rebound.     Hernia: No hernia is present.  Musculoskeletal:        General: No swelling or tenderness.     Cervical back: Normal range of motion and neck supple.  Skin:    General: Skin is warm and dry.  Neurological:     General: No focal deficit present.     Mental Status: She is oriented to person, place, and time. She is lethargic.  Psychiatric:        Mood and Affect: Mood normal.        Behavior: Behavior normal.     GI:  Lab Results: Recent Labs    09/08/19 0822  WBC 9.8  HGB 7.2*  HCT 23.6*  PLT 314    BMET Recent Labs    09/08/19 0822  NA 140  K 3.4*  CL 107  CO2 27  GLUCOSE 70  BUN 9  CREATININE 0.49  CALCIUM 8.9   LFT Recent Labs    09/08/19 0822  PROT 6.4*  ALBUMIN 2.9*  AST 61*  ALT 27  ALKPHOS 128*  BILITOT 0.7   PT/INR Recent Labs    09/08/19 0822  LABPROT 13.6  INR 1.1     Studies/Results: DG Chest Portable 1 View  Result Date: 09/08/2019 CLINICAL DATA:  Chest pain. EXAM: PORTABLE CHEST 1 VIEW COMPARISON:  July 30, 2019. FINDINGS: The heart size and mediastinal contours are within normal limits. Both lungs are clear. No pneumothorax or pleural effusion is noted. The visualized skeletal structures are unremarkable. IMPRESSION: No active disease. Electronically Signed   By: Marijo Conception M.D.   On: 09/08/2019 09:00   Impression: Rectal bleeding: diverticulosis vs. Mass/polyps vs. Stercoral colitis, no prior colonoscopy -Hemoglobin 7.2 on arrival yesterday (decreased from 9.4 as of 05/2019) -INR 1.2   Weight loss, ETOH use  Plan:  EGD and colonoscopy tomorrow.  I thoroughly reviewed the procedures with the patient to include nature, alternatives, benefits, and risks (including but not limited to bleeding, infection, perforation, anesthesia/cardiac and pulmonary complications).  Patient verbalized understanding and gave verbal consent to proceed with EGD and colonoscopy.  Clear liquid diet with Nulytely prep this evening.  Continue supportive care with IV fluids.  Continue Protonix 40 mg IV twice daily.  Continue to monitor H&H with transfusion as needed to maintain hemoglobin greater than 7.  [facility] will follow.   LOS: 0 days   Salley Slaughter  PA-C 09/08/2019, 3:11 PM  Contact #  [phone]

## 2019-09-08 NOTE — ED Notes (Signed)
Attempted report x1. 

## 2019-09-08 NOTE — H&P (Signed)
History and Physical    Brandy Bishop GXQ:[id number] DOB: 01/23/1961 DOA: 09/08/2019  PCP: Eston Esters, NP - [facility]:  Griffin Basil - orthopedics Patient coming from:  Home - lives with fiance and friend; NOK: Emeline Darling, [phone]  Chief Complaint: GI bleeding  HPI: Brandy Bishop is a 59 y.o. female with medical history significant of seizures; polysubstance abuse; HTN; and alcoholic pancreatitis presenting with bloody stools.  She had bright red blood in her stools this AM and it is ongoing.  She started trying to have a BM and then she noticed she was bleeding.  She was fine yesterday.  She had a normal BM yesterday, "I got constipated last night."  She takes Naproxen for pain.  She was on blood thinners NOS but she ran out.  She denies drinking during the week but drinks 1-3 beers on weekends.  In review of prior records, ETOH level was 168-265 on her 4 previous ER visits (most recently 07/30/19).  UDS was cocaine positive on 02/25/15.    ED Course:  GI bleed - 1 episode today, heme positive.  Hgb 9 -> 7.  No melena.  GI to see.  Given Protonix and 1 unit PRBC.  Review of Systems: As per HPI; otherwise review of systems reviewed and negative.   Ambulatory Status:  Ambulates without assistance  COVID Vaccine Status:  None  Past Medical History:  Diagnosis Date  . Alcoholism ([place]) 2011  . Asthma   . Chronic lower back pain   . Colitis 04/2014.   Bloody diarrhea  . Daily headache   . GIB (gastrointestinal bleeding)   . Hypertension   . Pancreatitis    Chronic pancreatitis noted on CT scan in 04/2014.  Marland Kitchen Polysubstance abuse ([place]) 2011  . Pyelonephritis 05/2012  . Seizures ([place])    last seizure was 04/2019 per patient     Past Surgical History:  Procedure Laterality Date  . ESOPHAGOGASTRODUODENOSCOPY N/A 05/31/2015   Procedure: ESOPHAGOGASTRODUODENOSCOPY (EGD);  Surgeon: Irene Shipper, MD;  Location: [facility] ENDOSCOPY;  Service:  Endoscopy;  Laterality: N/A;  . NO PAST SURGERIES    . ORIF HUMERUS FRACTURE Right 05/12/2019   Procedure: OPEN REDUCTION INTERNAL FIXATION (ORIF) DISTAL HUMERUS FRACTURE;  Surgeon: Hiram Gash, MD;  Location: [facility] ORS;  Service: Orthopedics;  Laterality: Right;    Social History   Socioeconomic History  . Marital status: Single    Spouse name: Not on file  . Number of children: 0  . Years of education: 41  . Highest education level: Not on file  Occupational History  . Not on file  Tobacco Use  . Smoking status: Never Smoker  . Smokeless tobacco: Never Used  Vaping Use  . Vaping Use: Never used  Substance and Sexual Activity  . Alcohol use: Yes    Alcohol/week: 10.0 standard drinks    Types: 10 Cans of beer per week  . Drug use: No  . Sexual activity: Never  Other Topics Concern  . Not on file  Social History Narrative   Lives in [place].    Disabled due to seizures.    Stay with a friend.    Single. No kids.    Social Determinants of Health   Financial Resource Strain:   . Difficulty of Paying Living Expenses:   Food Insecurity:   . Worried About Charity fundraiser in the Last Year:   . Arboriculturist in the Last Year:   [employer]  Needs:   . Lack of Transportation (Medical):   Marland Kitchen Lack of Transportation (Non-Medical):   Physical Activity:   . Days of Exercise per Week:   . Minutes of Exercise per Session:   Stress:   . Feeling of Stress :   Social Connections:   . Frequency of Communication with Friends and Family:   . Frequency of Social Gatherings with Friends and Family:   . Attends Religious Services:   . Active Member of Clubs or Organizations:   . Attends Archivist Meetings:   Marland Kitchen Marital Status:   Intimate Partner Violence:   . Fear of Current or Ex-Partner:   . Emotionally Abused:   Marland Kitchen Physically Abused:   . Sexually Abused:     Allergies  Allergen Reactions  . Penicillins Swelling    .Has patient had a PCN reaction causing immediate  rash, facial/tongue/throat swelling, SOB or lightheadedness with hypotension: Yes Has patient had a PCN reaction causing severe rash involving mucus membranes or skin necrosis: No Has patient had a PCN reaction that required hospitalization No Has patient had a PCN reaction occurring within the last 10 years: No If all of the above answers are "NO", then may proceed with Cephalosporin use.     Family History  Problem Relation Age of Onset  . Cancer Mother   . Cancer Brother     Prior to Admission medications   Medication Sig Start Date End Date Taking? Authorizing Provider  acetaminophen (TYLENOL) 500 MG tablet Take 1,000 mg by mouth every 6 (six) hours as needed for mild pain.    [provider]  albuterol (PROVENTIL HFA;VENTOLIN HFA) 108 (90 Base) MCG/ACT inhaler Inhale 1-2 puffs into the lungs every 6 (six) hours as needed for wheezing or shortness of breath.    [provider]  atenolol (TENORMIN) 25 MG tablet Take 2 tablets (50 mg total) by mouth daily. Patient not taking: Reported on 05/07/2019 10/15/14   [name], NP  betamethasone valerate ointment (VALISONE) 0.1 % Apply 1 application topically 2 (two) times daily as needed (itching).    [provider]  ferrous sulfate 325 (65 FE) MG tablet Take 1 tablet (325 mg total) by mouth 2 (two) times daily with a meal. Patient not taking: Reported on 05/07/2019 06/08/17   Virgel Manifold, MD  FLOVENT HFA 110 MCG/ACT inhaler Take 2 puffs by mouth 2 (two) times daily. 11/05/17   [provider]  gabapentin (NEURONTIN) 100 MG capsule Take 1 capsule (100 mg total) by mouth 2 (two) times daily for 14 days. For pain. 05/12/19 05/26/19  Ethelda Chick, PA-C  hydrochlorothiazide (HYDRODIURIL) 25 MG tablet Take 25 mg by mouth daily. 10/18/17   [provider]  hydrOXYzine (ATARAX/VISTARIL) 25 MG tablet Take 25 mg by mouth at bedtime. 04/05/19   [provider]  omeprazole (PRILOSEC) 20 MG capsule Take  1 capsule (20 mg total) by mouth daily. 07/30/19   Quintella Reichert, MD  phenytoin (DILANTIN) 300 MG ER capsule Take 1 capsule (300 mg total) by mouth at bedtime. Patient not taking: Reported on 07/30/2019 06/01/15   Robbie Lis, MD  sucralfate (CARAFATE) 1 GM/10ML suspension Take 10 mLs (1 g total) by mouth 4 (four) times daily -  with meals and at bedtime. 07/30/19   Quintella Reichert, MD  triamcinolone ointment (KENALOG) 0.1 % Apply 1 application topically 2 (two) times daily. 05/20/19   [provider]    Physical Exam: Vitals:   09/08/19 6269  09/08/19 0844 09/08/19 1420 09/08/19 1435  BP: 133/85 (!) 154/89 (!) 176/90 (!) 156/100  Pulse: 86 83 63 73  Resp: 16 18 17 17   Temp: 98.9 F (37.2 C)  98.8 F (37.1 C) 98.8 F (37.1 C)  TempSrc: Oral  Oral Oral  SpO2: 100% 100%       . General:  Appears calm and comfortable and is NAD . Eyes:  PERRL, EOMI, normal lids, iris . ENT:  grossly normal hearing, lips & tongue, mmm . Neck:  no LAD, masses or thyromegaly . Cardiovascular:  RRR, no m/r/g. No LE edema.  Marland Kitchen Respiratory:   CTA bilaterally with no wheezes/rales/rhonchi.  Normal respiratory effort. . Abdomen:  soft, mild epigastric TTP, ND, NABS . Back:   normal alignment, no CVAT . Skin:  no rash or induration seen on limited exam . Musculoskeletal:  grossly normal tone BUE/BLE, good ROM, no bony abnormality . Psychiatric:  grossly normal (mild cantankerous) mood and affect, speech fluent and appropriate, AOx3 . Neurologic:  CN 2-12 grossly intact, moves all extremities in coordinated fashion    Radiological Exams on Admission: DG Chest Portable 1 View  Result Date: 09/08/2019 CLINICAL DATA:  Chest pain. EXAM: PORTABLE CHEST 1 VIEW COMPARISON:  July 30, 2019. FINDINGS: The heart size and mediastinal contours are within normal limits. Both lungs are clear. No pneumothorax or pleural effusion is noted. The visualized skeletal structures are unremarkable. IMPRESSION: No active  disease. Electronically Signed   By: Marijo Conception M.D.   On: 09/08/2019 09:00    EKG: pending   Labs on Admission: I have personally reviewed the available labs and imaging studies at the time of the admission.  Pertinent labs:   Albumin 2.9 AST 61/ALT 27 HS troponin 7, 6 WBC 9.8 Hgb 7.2; 9.4 on 4/7 INR 1.1    Assessment/Plan Principal Problem:   Acute GI bleeding Active Problems:   Seizure disorder (HCC)   HTN (hypertension)   Polysubstance abuse ([place])   Acute GI bleeding -Patient presenting with acute onset of dark red rectal bleeding today -Patient has history of alcoholic pancreatitis; it is possible that the patient developed esophageal varices  -Other potential differential diagnoses are gastric/duodenal ulcer and/or gastritis/duodenitis, particularly given patient's h/o NSAID (Naproxen) use -Her Hgb decreased from 9.4 on 4/7 to 7.2 today.  -Type and screen were done in ED.  -One unit of blood was ordered by ED.  - will observe in med surg bed - GI consulted by ED, will follow up recommendations - NPO after MN for possible EGD - NS at 75 mL/hr - Start IV pantoprazole 40 mg bid - Zofran IV for nausea - Avoid NSAIDs and SQ heparin - Maintain IV access (2 large bore IVs if possible). - Monitor closely and follow cbc, transfuse as necessary.  Seizure d/o -Negative phenytoin level on 6/25, no longer listed on her medication list -No current report of seizure  HTN -Continue Norvasc  H/o polysubstance abuse -Patient denies current issue but has had significantly increased ETOH level on all recent admissions and UDS + cocaine prior with prior h/o ETOH-associated pancreatitis -She is at increased risk for complications of withdrawal including seizures, DTs -Will observe -CIWA protocol -TOC consult for substance abuse education -Elevated LFTs are likely related to alcoholism    Note: This patient has been tested and is negative for the novel coronavirus  COVID-19.  DVT prophylaxis:  SCDs Code Status:  Full - confirmed with patient Family Communication: None present; she declined  to have me speak with family at the time of admission Disposition Plan:  The patient is from: home  Anticipated d/c is to: home without [facility] services   Anticipated d/c date will depend on clinical response to treatment, but possibly as early as tomorrow if she has excellent response to treatment  Patient is currently: acutely ill Consults called: GI Admission status:  It is my clinical opinion that referral for OBSERVATION is reasonable and necessary in this patient based on the above information provided. The aforementioned taken together are felt to place the patient at high risk for further clinical deterioration. However it is anticipated that the patient may be medically stable for discharge from the hospital within 24 to 48 hours.    Karmen Bongo MD [facility]   How to contact the [facility] Attending or Consulting provider [place] or covering provider during after hours [place], for this patient?  1. Check the care team in [facility] and look for a) attending/consulting [facility] provider listed and b) the [facility] team listed 2. Log into [email or web address] and use [place]'s universal password to access. If you do not have the password, please contact the hospital operator. 3. Locate the [facility] provider you are looking for under [facility] and page to a number that you can be directly reached. 4. If you still have difficulty reaching the provider, please page the [facility] (Director on Call) for the Hospitalists listed on amion for assistance.   09/08/2019, 2:36 PM

## 2019-09-08 NOTE — ED Notes (Signed)
Pt asking for pain med. Informed Ryan - RN.

## 2019-09-08 NOTE — ED Notes (Signed)
Pt given Sprite and broth, per Thurmond Butts - RN.

## 2019-09-08 NOTE — ED Notes (Signed)
Attempted report x 2 

## 2019-09-08 NOTE — ED Notes (Signed)
Failed attempt to collect labs   

## 2019-09-08 NOTE — Consult Note (Signed)
Referring Provider: Coral Ceo, PA (ED) Primary Care Physician:  Eston Esters, NP Primary Gastroenterologist: Althia Forts  Reason for Consultation: Rectal bleeding  HPI: Brandy Bishop is a 59 y.o. female with history of alcohol use, seizures, GI bleeding, and alcoholic pancreatitis presenting with rectal bleeding.  Patient states she has had several episodes of rectal bleeding this morning.  Blood is bright red and happens anytime she attempts to have a bowel movement.  She reports constipation last night.  Denies any diarrhea.  Has some lower abdominal discomfort but no overt pain.  Patient also reports unintentional weight loss but she cannot quantify the amount of weight she has lost.  Appetite is OK per her report.  Denies nausea, vomiting, hematemesis, dysphagia, GERD.  States she was previously on blood thinner (unsure which one) but states she ran out and has not taken it for over a week.  Denies any aspirin or NSAID use.  She states she drinks 1-3 beers on the weekend but denies daily use.  Denies any family history of colon cancer or gastrointestinal malignancy.  Past Medical History:  Diagnosis Date  . Alcoholism ([place]) 2011  . Asthma   . Chronic lower back pain   . Colitis 04/2014.   Bloody diarrhea  . Daily headache   . GIB (gastrointestinal bleeding)   . Hypertension   . Pancreatitis    Chronic pancreatitis noted on CT scan in 04/2014.  Marland Kitchen Polysubstance abuse ([place]) 2011  . Pyelonephritis 05/2012  . Seizures ([place])    last seizure was 04/2019 per patient     Past Surgical History:  Procedure Laterality Date  . ESOPHAGOGASTRODUODENOSCOPY N/A 05/31/2015   Procedure: ESOPHAGOGASTRODUODENOSCOPY (EGD);  Surgeon: Irene Shipper, MD;  [facility] ENDOSCOPY;  Service: Endoscopy;  Laterality: N/A;  . NO PAST SURGERIES    . ORIF HUMERUS FRACTURE Right 05/12/2019   Procedure: OPEN REDUCTION INTERNAL FIXATION (ORIF) DISTAL HUMERUS FRACTURE;  Surgeon: Hiram Gash, MD;   Location: [facility] ORS;  Service: Orthopedics;  Laterality: Right;    Prior to Admission medications   Medication Sig Start Date End Date Taking? Authorizing Provider  albuterol (PROVENTIL HFA;VENTOLIN HFA) 108 (90 Base) MCG/ACT inhaler Inhale 1-2 puffs into the lungs every 6 (six) hours as needed for wheezing or shortness of breath.   Yes [provider]  amLODipine (NORVASC) 10 MG tablet Take 10 mg by mouth daily. 08/13/19  Yes [provider]  hydrOXYzine (ATARAX/VISTARIL) 25 MG tablet Take 25 mg by mouth at bedtime. 04/05/19  Yes [provider]  omeprazole (PRILOSEC) 20 MG capsule Take 1 capsule (20 mg total) by mouth daily. 07/30/19  Yes Quintella Reichert, MD  triamcinolone ointment (KENALOG) 0.1 % Apply 1 application topically 2 (two) times daily as needed (rash).  05/20/19  Yes [provider]  sucralfate (CARAFATE) 1 GM/10ML suspension Take 10 mLs (1 g total) by mouth 4 (four) times daily -  with meals and at bedtime. Patient not taking: Reported on 09/08/2019 07/30/19   Quintella Reichert, MD    Scheduled Meds: . amLODipine  10 mg Oral Daily  . folic acid  1 mg Oral Daily  . hydrOXYzine  25 mg Oral QHS  . multivitamin with minerals  1 tablet Oral Daily  . pantoprazole  40 mg Intravenous Q12H  . sucralfate  1 g Oral TID WC & HS  . thiamine  100 mg Oral Daily   Or  . thiamine  100 mg Intravenous Daily   Continuous Infusions: . sodium  chloride     PRN Meds:.acetaminophen **OR** acetaminophen, albuterol, LORazepam **OR** LORazepam, ondansetron **OR** ondansetron (ZOFRAN) IV  Allergies as of 09/08/2019 - Review Complete 09/08/2019  Allergen Reaction Noted  . Penicillins Swelling 03/15/2011    Family History  Problem Relation Age of Onset  . Cancer Mother   . Cancer Brother     Social History   Socioeconomic History  . Marital status: Single    Spouse name: Not on file  . Number of children: 0  . Years of education: 37  . Highest education level:  Not on file  Occupational History  . Not on file  Tobacco Use  . Smoking status: Never Smoker  . Smokeless tobacco: Never Used  Vaping Use  . Vaping Use: Never used  Substance and Sexual Activity  . Alcohol use: Yes    Alcohol/week: 10.0 standard drinks    Types: 10 Cans of beer per week  . Drug use: No  . Sexual activity: Never  Other Topics Concern  . Not on file  Social History Narrative   Lives in [place].    Disabled due to seizures.    Stay with a friend.    Single. No kids.    Social Determinants of Health   Financial Resource Strain:   . Difficulty of Paying Living Expenses:   Food Insecurity:   . Worried About Charity fundraiser in the Last Year:   . Arboriculturist in the Last Year:   Transportation Needs:   . Film/video editor (Medical):   Marland Kitchen Lack of Transportation (Non-Medical):   Physical Activity:   . Days of Exercise per Week:   . Minutes of Exercise per Session:   Stress:   . Feeling of Stress :   Social Connections:   . Frequency of Communication with Friends and Family:   . Frequency of Social Gatherings with Friends and Family:   . Attends Religious Services:   . Active Member of Clubs or Organizations:   . Attends Archivist Meetings:   Marland Kitchen Marital Status:   Intimate Partner Violence:   . Fear of Current or Ex-Partner:   . Emotionally Abused:   Marland Kitchen Physically Abused:   . Sexually Abused:     Review of Systems: Review of Systems  Constitutional: Positive for malaise/fatigue and weight loss. Negative for chills and fever.  HENT: Negative for hearing loss and tinnitus.   Eyes: Negative for pain and redness.  Respiratory: Negative for cough and shortness of breath.   Cardiovascular: Negative for chest pain and palpitations.  Gastrointestinal: Positive for blood in stool and constipation. Negative for abdominal pain, diarrhea, heartburn, melena, nausea and vomiting.  Genitourinary: Negative for flank pain and hematuria.   Musculoskeletal: Negative for falls and joint pain.  Skin: Negative for itching and rash.  Neurological: Negative for dizziness and loss of consciousness.  Endo/Heme/Allergies: Negative for polydipsia. Does not bruise/bleed easily.  Psychiatric/Behavioral: Positive for substance abuse. The patient is not nervous/anxious.      Physical Exam: Vital signs: Vitals:   09/08/19 1428 09/08/19 1435  BP: (!) 158/100 (!) 156/100  Pulse: 75 73  Resp: 18 17  Temp:  98.8 F (37.1 C)  SpO2: 100%      Physical Exam Constitutional:      General: She is not in acute distress.    Appearance: Normal appearance. She is underweight.  HENT:     Head: Normocephalic and atraumatic.     Nose: Nose normal.  Mouth/Throat:     Mouth: Mucous membranes are moist.     Pharynx: Oropharynx is clear.  Eyes:     General: No scleral icterus.    Extraocular Movements: Extraocular movements intact.     Comments: Conjunctival pallor  Cardiovascular:     Rate and Rhythm: Normal rate and regular rhythm.     Pulses: Normal pulses.     Heart sounds: Normal heart sounds.  Pulmonary:     Effort: Pulmonary effort is normal. No respiratory distress.     Breath sounds: Normal breath sounds.  Abdominal:     General: Bowel sounds are normal. There is no distension.     Palpations: Abdomen is soft. There is no mass.     Tenderness: There is no abdominal tenderness. There is no guarding or rebound.     Hernia: No hernia is present.  Musculoskeletal:        General: No swelling or tenderness.     Cervical back: Normal range of motion and neck supple.  Skin:    General: Skin is warm and dry.  Neurological:     General: No focal deficit present.     Mental Status: She is oriented to person, place, and time. She is lethargic.  Psychiatric:        Mood and Affect: Mood normal.        Behavior: Behavior normal.     GI:  Lab Results: Recent Labs    09/08/19 0822  WBC 9.8  HGB 7.2*  HCT 23.6*  PLT 314    BMET Recent Labs    09/08/19 0822  NA 140  K 3.4*  CL 107  CO2 27  GLUCOSE 70  BUN 9  CREATININE 0.49  CALCIUM 8.9   LFT Recent Labs    09/08/19 0822  PROT 6.4*  ALBUMIN 2.9*  AST 61*  ALT 27  ALKPHOS 128*  BILITOT 0.7   PT/INR Recent Labs    09/08/19 0822  LABPROT 13.6  INR 1.1     Studies/Results: DG Chest Portable 1 View  Result Date: 09/08/2019 CLINICAL DATA:  Chest pain. EXAM: PORTABLE CHEST 1 VIEW COMPARISON:  July 30, 2019. FINDINGS: The heart size and mediastinal contours are within normal limits. Both lungs are clear. No pneumothorax or pleural effusion is noted. The visualized skeletal structures are unremarkable. IMPRESSION: No active disease. Electronically Signed   By: Marijo Conception M.D.   On: 09/08/2019 09:00   Impression: Rectal bleeding: diverticulosis vs. Mass/polyps vs. Stercoral colitis, no prior colonoscopy -Hemoglobin 7.2 on arrival yesterday (decreased from 9.4 as of 05/2019) -INR 1.2   Weight loss, ETOH use  Plan:  EGD and colonoscopy tomorrow.  I thoroughly reviewed the procedures with the patient to include nature, alternatives, benefits, and risks (including but not limited to bleeding, infection, perforation, anesthesia/cardiac and pulmonary complications).  Patient verbalized understanding and gave verbal consent to proceed with EGD and colonoscopy.  Clear liquid diet with Nulytely prep this evening.  Continue supportive care with IV fluids.  Continue Protonix 40 mg IV twice daily.  Continue to monitor H&H with transfusion as needed to maintain hemoglobin greater than 7.  [facility] will follow.   LOS: 0 days   Salley Slaughter  PA-C 09/08/2019, 3:11 PM  Contact #  [phone]

## 2019-09-09 ENCOUNTER — Observation Stay (HOSPITAL_COMMUNITY): Payer: Medicaid Other | Admitting: Anesthesiology

## 2019-09-09 ENCOUNTER — Encounter (HOSPITAL_COMMUNITY): Payer: Self-pay | Admitting: Internal Medicine

## 2019-09-09 ENCOUNTER — Encounter (HOSPITAL_COMMUNITY)
Admission: EM | Disposition: A | Discharge status: Home / Self Care | Payer: Self-pay | Source: Home / Self Care | Attending: Emergency Medicine

## 2019-09-09 DIAGNOSIS — K635 Polyp of colon: Secondary | ICD-10-CM | POA: Diagnosis not present

## 2019-09-09 DIAGNOSIS — K922 Gastrointestinal hemorrhage, unspecified: Secondary | ICD-10-CM | POA: Diagnosis not present

## 2019-09-09 DIAGNOSIS — K640 First degree hemorrhoids: Secondary | ICD-10-CM | POA: Diagnosis not present

## 2019-09-09 DIAGNOSIS — K921 Melena: Secondary | ICD-10-CM | POA: Diagnosis not present

## 2019-09-09 HISTORY — PX: COLONOSCOPY WITH PROPOFOL: SHX5780

## 2019-09-09 HISTORY — PX: ESOPHAGOGASTRODUODENOSCOPY: SHX5428

## 2019-09-09 LAB — BPAM RBC
Blood Product Expiration Date: 202109032359
ISSUE DATE / TIME: 202108041347
Unit Type and Rh: 7300

## 2019-09-09 LAB — BASIC METABOLIC PANEL
Anion gap: 10 (ref 5–15)
BUN: 5 mg/dL — ABNORMAL LOW (ref 6–20)
CO2: 26 mmol/L (ref 22–32)
Calcium: 8.2 mg/dL — ABNORMAL LOW (ref 8.9–10.3)
Chloride: 102 mmol/L (ref 98–111)
Creatinine, Ser: 0.42 mg/dL — ABNORMAL LOW (ref 0.44–1.00)
GFR calc Af Amer: >60 mL/min (ref >60)
GFR calc non Af Amer: >60 mL/min (ref >60)
Glucose, Bld: 80 mg/dL (ref 70–99)
Potassium: 3.7 mmol/L (ref 3.5–5.1)
Sodium: 138 mmol/L (ref 135–145)

## 2019-09-09 LAB — CBC
HCT: 28.5 % — ABNORMAL LOW (ref 36.0–46.0)
Hemoglobin: 9.2 g/dL — ABNORMAL LOW (ref 12.0–15.0)
MCH: 27.8 pg (ref 26.0–34.0)
MCHC: 32.3 g/dL (ref 30.0–36.0)
MCV: 86.1 fL (ref 80.0–100.0)
Platelets: 260 10*3/uL (ref 150–400)
RBC: 3.31 MIL/uL — ABNORMAL LOW (ref 3.87–5.11)
RDW: 22.8 % — ABNORMAL HIGH (ref 11.5–15.5)
WBC: 8.2 10*3/uL (ref 4.0–10.5)
nRBC: 0 % (ref 0.0–0.2)

## 2019-09-09 LAB — TYPE AND SCREEN
ABO/RH(D): B POS
Antibody Screen: NEGATIVE
Unit division: 0

## 2019-09-09 LAB — HIV ANTIBODY (ROUTINE TESTING W REFLEX): HIV Screen 4th Generation wRfx: NONREACTIVE

## 2019-09-09 SURGERY — EGD (ESOPHAGOGASTRODUODENOSCOPY)
Anesthesia: Monitor Anesthesia Care

## 2019-09-09 MED ORDER — MIDAZOLAM HCL 2 MG/2ML IJ SOLN
INTRAMUSCULAR | Status: AC
  Filled 2019-09-09: qty 2

## 2019-09-09 MED ORDER — LACTATED RINGERS IV SOLN
INTRAVENOUS | Status: AC | PRN
  Administered 2019-09-09: 1000 mL via INTRAVENOUS

## 2019-09-09 MED ORDER — PROPOFOL 500 MG/50ML IV EMUL
INTRAVENOUS | Status: DC | PRN
  Administered 2019-09-09: 125 ug/kg/min via INTRAVENOUS

## 2019-09-09 MED ORDER — OMEPRAZOLE 40 MG PO CPDR: 40.0000 mg | DELAYED_RELEASE_CAPSULE | Freq: Every day | ORAL | 0 refills | Status: DC

## 2019-09-09 MED ORDER — FOLIC ACID 1 MG PO TABS: 1.0000 mg | ORAL_TABLET | Freq: Every day | ORAL | 0 refills | Status: DC

## 2019-09-09 MED ORDER — BUTAMBEN-TETRACAINE-BENZOCAINE 2-2-14 % EX AERO
INHALATION_SPRAY | CUTANEOUS | Status: DC | PRN
  Administered 2019-09-09: 2 via TOPICAL

## 2019-09-09 MED ORDER — THIAMINE HCL 100 MG PO TABS: 100.0000 mg | ORAL_TABLET | Freq: Every day | ORAL | 0 refills | Status: DC

## 2019-09-09 MED ORDER — MIDAZOLAM HCL 2 MG/2ML IJ SOLN
INTRAMUSCULAR | Status: DC | PRN
  Administered 2019-09-09: 2 mg via INTRAVENOUS

## 2019-09-09 MED ORDER — PROPOFOL 10 MG/ML IV BOLUS
INTRAVENOUS | Status: DC | PRN
  Administered 2019-09-09: 20 mg via INTRAVENOUS

## 2019-09-09 SURGICAL SUPPLY — 22 items

## 2019-09-09 NOTE — Anesthesia Preprocedure Evaluation (Addendum)
Anesthesia Evaluation  Patient identified by MRN, date of birth, ID band Patient awake    Reviewed: Allergy & Precautions, NPO status , Patient's Chart, lab work & pertinent test results  Airway Mallampati: III  TM Distance: >3 FB     Dental  (+) Poor Dentition, Dental Advisory Given   Pulmonary asthma ,    breath sounds clear to auscultation       Cardiovascular hypertension,  Rhythm:Regular Rate:Normal     Neuro/Psych  Headaches, Seizures -,  PSYCHIATRIC DISORDERS    GI/Hepatic negative GI ROS, Neg liver ROS, (+) Hepatitis -  Endo/Other    Renal/GU negative Renal ROS     Musculoskeletal   Abdominal   Peds  Hematology   Anesthesia Other Findings   Reproductive/Obstetrics                            Anesthesia Physical Anesthesia Plan  ASA: III  Anesthesia Plan: MAC   Post-op Pain Management:    Induction: Intravenous  PONV Risk Score and Plan: 3 and Ondansetron and Midazolam  Airway Management Planned: Simple Face Mask  Additional Equipment:   Intra-op Plan:   Post-operative Plan:   Informed Consent: I have reviewed the patients History and Physical, chart, labs and discussed the procedure including the risks, benefits and alternatives for the proposed anesthesia with the patient or authorized representative who has indicated his/her understanding and acceptance.     Dental advisory given  Plan Discussed with: CRNA and Anesthesiologist  Anesthesia Plan Comments:         Anesthesia Quick Evaluation

## 2019-09-09 NOTE — Discharge Summary (Signed)
Brandy Bishop ERX:[id number] DOB: 1960-05-20 DOA: 09/08/2019  PCP: Eston Esters, NP  Admit date: 09/08/2019 Discharge date: 09/09/2019  Admitted From: home Disposition:  home  Recommendations for Outpatient Follow-up:  1. Follow up with PCP in 1 week 2. Please obtain BMP/CBC in one week 3. Follow-up with Dr. Michail Sermon GI in 1 week     Discharge Condition:Stable CODE STATUS: Full Diet recommendation: Heart Healthy  Brief/Interim Summary: Brandy Bishop is a 59 y.o. female with medical history significant of seizures; polysubstance abuse; HTN; and alcoholic pancreatitis presenting with bloody stools.  She had bright red blood in her stools this AM and it is ongoing.  She started trying to have a BM and then she noticed she was bleeding. In review of prior records, ETOH level was 168-265 on her 4 previous ER visits (most recently 07/30/19).  UDS was cocaine positive on 02/25/15.  In the ED she GI bleed positive.  No melena.  GI was consulted.  She was started on Protonix.  Was given 1 unit packed red blood cells for transfusion.  She underwent EGD which revealed no active bleeding, gastritis, suspected resolved diverticular bleed.  She was started on full liquid diet and advance as tolerated.  GI signed off and they will arrange for follow-up as outpatient.  Her H&H has remained stable in the same.  She is stable for discharge.  She was instructed not to take any NSAIDs and abstain from alcohol altogether.  She verbalizes an understanding.  Family was updated via phone  Discharge Diagnoses:  Principal Problem:   Acute GI bleeding Active Problems:   Seizure disorder ([place])   HTN (hypertension)   Polysubstance abuse [facility])    Discharge Instructions  Discharge Instructions    Call MD for:  persistant nausea and vomiting   Complete by: As directed    Diet - low sodium heart healthy   Complete by: As directed    Discharge instructions   Complete by: As directed    Stop alcohol  use No NSAIDS F/u with pcp in one week and f/u with Dr. Hulen Skains GI in one week   Increase activity slowly   Complete by: As directed      Allergies as of 09/09/2019      Reactions   Penicillins Swelling   .Has patient had a PCN reaction causing immediate rash, facial/tongue/throat swelling, SOB or lightheadedness with hypotension: Yes Has patient had a PCN reaction causing severe rash involving mucus membranes or skin necrosis: No Has patient had a PCN reaction that required hospitalization No Has patient had a PCN reaction occurring within the last 10 years: No If all of the above answers are "NO", then may proceed with Cephalosporin use.      Medication List    TAKE these medications   albuterol 108 (90 Base) MCG/ACT inhaler Commonly known as: VENTOLIN HFA Inhale 1-2 puffs into the lungs every 6 (six) hours as needed for wheezing or shortness of breath.   amLODipine 10 MG tablet Commonly known as: NORVASC Take 10 mg by mouth daily.   folic acid 1 MG tablet Commonly known as: FOLVITE Take 1 tablet (1 mg total) by mouth daily. Start taking on: September 10, 2019   hydrOXYzine 25 MG tablet Commonly known as: ATARAX/VISTARIL Take 25 mg by mouth at bedtime.   omeprazole 40 MG capsule Commonly known as: PRILOSEC Take 1 capsule (40 mg total) by mouth daily. What changed:   medication strength  how much to take  sucralfate 1 GM/10ML suspension Commonly known as: Carafate Take 10 mLs (1 g total) by mouth 4 (four) times daily -  with meals and at bedtime.   thiamine 100 MG tablet Take 1 tablet (100 mg total) by mouth daily. Start taking on: September 10, 2019   triamcinolone ointment 0.1 % Commonly known as: KENALOG Apply 1 application topically 2 (two) times daily as needed (rash).       Follow-up Information    Wilford Corner, MD Follow up in 1 week(s).   Specialty: Gastroenterology Contact information: [address] [place]  [address] [phone]        Eston Esters, NP Follow up in 1 week(s).   Specialty: Family Medicine Contact information: [address] [place] [address] [phone]              Allergies  Allergen Reactions  . Penicillins Swelling    .Has patient had a PCN reaction causing immediate rash, facial/tongue/throat swelling, SOB or lightheadedness with hypotension: Yes Has patient had a PCN reaction causing severe rash involving mucus membranes or skin necrosis: No Has patient had a PCN reaction that required hospitalization No Has patient had a PCN reaction occurring within the last 10 years: No If all of the above answers are "NO", then may proceed with Cephalosporin use.     Consultations:  GI   Procedures/Studies: DG Chest Portable 1 View  Result Date: 09/08/2019 CLINICAL DATA:  Chest pain. EXAM: PORTABLE CHEST 1 VIEW COMPARISON:  July 30, 2019. FINDINGS: The heart size and mediastinal contours are within normal limits. Both lungs are clear. No pneumothorax or pleural effusion is noted. The visualized skeletal structures are unremarkable. IMPRESSION: No active disease. Electronically Signed   By: Marijo Conception M.D.   On: 09/08/2019 09:00       Subjective: No complaints this AM  Discharge Exam: Vitals:   09/09/19 1300 09/09/19 1310  BP: (!) 140/91 (!) 142/83  Pulse: 90 82  Resp: (!) 21 18  Temp:    SpO2: 100% 100%   Vitals:   09/09/19 1054 09/09/19 1250 09/09/19 1300 09/09/19 1310  BP: (!) 170/90 (!) 96/57 (!) 140/91 (!) 142/83  Pulse: 75 96 90 82  Resp: 19 (!) 27 (!) 21 18  Temp: 99.1 F (37.3 C) 97.8 F (36.6 C)    TempSrc: Oral Oral    SpO2: 100% 100% 100% 100%  Weight: 46.3 kg     Height: 5\' 3"  (1.6 m)       General: Pt is alert, awake, not in acute distress Cardiovascular: RRR, S1/S2 +, no rubs, no gallops Respiratory: CTA bilaterally, no wheezing, no rhonchi Abdominal: Soft, NT, ND, bowel sounds + Extremities: no edema, no  cyanosis    The results of significant diagnostics from this hospitalization (including imaging, microbiology, ancillary and laboratory) are listed below for reference.     Microbiology: Recent Results (from the past 240 hour(s))  SARS Coronavirus 2 by RT PCR (hospital order, performed in [facility] hospital lab) Nasopharyngeal Nasopharyngeal Swab     Status: None   Collection Time: 09/08/19 12:03 PM   Specimen: Nasopharyngeal Swab  Result Value Ref Range Status   SARS Coronavirus 2 NEGATIVE NEGATIVE Final    Comment: (NOTE) SARS-CoV-2 target nucleic acids are NOT DETECTED.  The SARS-CoV-2 RNA is generally detectable in upper and lower respiratory specimens during the acute phase of infection. The lowest concentration of SARS-CoV-2 viral copies this assay can detect is 250 copies /  mL. A negative result does not preclude SARS-CoV-2 infection and should not be used as the sole basis for treatment or other patient management decisions.  A negative result may occur with improper specimen collection / handling, submission of specimen other than nasopharyngeal swab, presence of viral mutation(s) within the areas targeted by this assay, and inadequate number of viral copies (<250 copies / mL). A negative result must be combined with clinical observations, patient history, and epidemiological information.  Fact Sheet for Patients:   StrictlyIdeas.no  Fact Sheet for Healthcare Providers: BankingDealers.co.za  This test is not yet approved or  cleared by the [place] FDA and has been authorized for detection and/or diagnosis of SARS-CoV-2 by FDA under an Emergency Use Authorization (EUA).  This EUA will remain in effect (meaning this test can be used) for the duration of the COVID-19 declaration under Section 564(b)(1) of the Act, 21 U.S.C. section 360bbb-3(b)(1), unless the authorization is terminated or revoked sooner.  Performed  at [facility], [place] [address] [place],  [address]      Labs: BNP (last 3 results) No results for input(s): BNP in the last 8760 hours. Basic Metabolic Panel: Recent Labs  Lab 09/08/19 0822 09/09/19 0516  NA 140 138  K 3.4* 3.7  CL 107 102  CO2 27 26  GLUCOSE 70 80  BUN 9 5*  CREATININE 0.49 0.42*  CALCIUM 8.9 8.2*   Liver Function Tests: Recent Labs  Lab 09/08/19 0822  AST 61*  ALT 27  ALKPHOS 128*  BILITOT 0.7  PROT 6.4*  ALBUMIN 2.9*   No results for input(s): LIPASE, AMYLASE in the last 168 hours. No results for input(s): AMMONIA in the last 168 hours. CBC: Recent Labs  Lab 09/08/19 0822 09/09/19 0516  WBC 9.8 8.2  NEUTROABS 7.0  --   HGB 7.2* 9.2*  HCT 23.6* 28.5*  MCV 91.1 86.1  PLT 314 260   Cardiac Enzymes: No results for input(s): CKTOTAL, CKMB, CKMBINDEX, TROPONINI in the last 168 hours. BNP: Invalid input(s): POCBNP CBG: No results for input(s): GLUCAP in the last 168 hours. D-Dimer No results for input(s): DDIMER in the last 72 hours. Hgb A1c No results for input(s): HGBA1C in the last 72 hours. Lipid Profile No results for input(s): CHOL, HDL, LDLCALC, TRIG, CHOLHDL, LDLDIRECT in the last 72 hours. Thyroid function studies No results for input(s): TSH, T4TOTAL, T3FREE, THYROIDAB in the last 72 hours.  Invalid input(s): FREET3 Anemia work up No results for input(s): VITAMINB12, FOLATE, FERRITIN, TIBC, IRON, RETICCTPCT in the last 72 hours. Urinalysis    Component Value Date/Time   COLORURINE YELLOW 04/02/2019 2222   APPEARANCEUR HAZY (A) 04/02/2019 2222   LABSPEC 1.006 04/02/2019 2222   PHURINE 5.0 04/02/2019 2222   GLUCOSEU NEGATIVE 04/02/2019 2222   HGBUR SMALL (A) 04/02/2019 2222   BILIRUBINUR NEGATIVE 04/02/2019 2222   KETONESUR NEGATIVE 04/02/2019 2222   PROTEINUR NEGATIVE 04/02/2019 2222   UROBILINOGEN 0.2 04/14/2014 1419   NITRITE POSITIVE (A) 04/02/2019 2222   LEUKOCYTESUR MODERATE (A) 04/02/2019 2222    Sepsis Labs Invalid input(s): PROCALCITONIN,  WBC,  LACTICIDVEN Microbiology Recent Results (from the past 240 hour(s))  SARS Coronavirus 2 by RT PCR (hospital order, performed in [place] hospital lab) Nasopharyngeal Nasopharyngeal Swab     Status: None   Collection Time: 09/08/19 12:03 PM   Specimen: Nasopharyngeal Swab  Result Value Ref Range Status   SARS Coronavirus 2 NEGATIVE NEGATIVE Final    Comment: (NOTE) SARS-CoV-2 target  nucleic acids are NOT DETECTED.  The SARS-CoV-2 RNA is generally detectable in upper and lower respiratory specimens during the acute phase of infection. The lowest concentration of SARS-CoV-2 viral copies this assay can detect is 250 copies / mL. A negative result does not preclude SARS-CoV-2 infection and should not be used as the sole basis for treatment or other patient management decisions.  A negative result may occur with improper specimen collection / handling, submission of specimen other than nasopharyngeal swab, presence of viral mutation(s) within the areas targeted by this assay, and inadequate number of viral copies (<250 copies / mL). A negative result must be combined with clinical observations, patient history, and epidemiological information.  Fact Sheet for Patients:   StrictlyIdeas.no  Fact Sheet for Healthcare Providers: BankingDealers.co.za  This test is not yet approved or  cleared by the [place] FDA and has been authorized for detection and/or diagnosis of SARS-CoV-2 by FDA under an Emergency Use Authorization (EUA).  This EUA will remain in effect (meaning this test can be used) for the duration of the COVID-19 declaration under Section 564(b)(1) of the Act, 21 U.S.C. section 360bbb-3(b)(1), unless the authorization is terminated or revoked sooner.  Performed at [facility], [place] [address] [place], [place] [address]      Time coordinating discharge: Over  30 minutes  SIGNED:   Nolberto Hanlon, MD  [facility] 09/09/2019, 2:45 PM Pager   If 7PM-7AM, please contact night-coverage [email or web address] Password [email or web address]

## 2019-09-09 NOTE — Transfer of Care (Signed)
Immediate Anesthesia Transfer of Care Note  Patient: Brandy Bishop  Procedure(s) Performed: ESOPHAGOGASTRODUODENOSCOPY (EGD) (N/A ) COLONOSCOPY WITH PROPOFOL (N/A )  Patient Location: PACU and Endoscopy Unit  Anesthesia Type:MAC  Level of Consciousness: oriented, drowsy and patient cooperative  Airway & Oxygen Therapy: Patient Spontanous Breathing and Patient connected to nasal cannula oxygen  Post-op Assessment: Report given to RN and Post -op Vital signs reviewed and stable  Post vital signs: Reviewed  Last Vitals:  Vitals Value Taken Time  BP 96/57 09/09/19 1251  Temp    Pulse    Resp 20 09/09/19 1253  SpO2    Vitals shown include unvalidated device data.  Last Pain:  Vitals:   09/09/19 1054  TempSrc: Oral  PainSc: 0-No pain         Complications: No complications documented.

## 2019-09-09 NOTE — Progress Notes (Signed)
Discharge instructions reviewed at bedside.  Patient verbalized understanding and declined further education.  Patient transported off unit for discharge via wheelchair and volunteer services.

## 2019-09-09 NOTE — Op Note (Signed)
[facility] Patient Name: Brandy Bishop Procedure Date : 09/09/2019 MRN: [id number] Attending MD: [name] , MD Date of Birth: Mar 17, 1960 CSN: [id number] Age: 60 Admit Type: Inpatient Procedure:                Upper GI endoscopy Indications:              Hematochezia Providers:                [name], MD, Wynonia Sours, RN, Cherylynn Ridges, Technician, Mora Appl, CRNA Referring MD:             hospital team Medicines:                Propofol per Anesthesia, Monitored Anesthesia Care Complications:            No immediate complications. Estimated Blood Loss:     Estimated blood loss: none. Procedure:                Pre-Anesthesia Assessment:                           - Prior to the procedure, a History and Physical                            was performed, and patient medications and                            allergies were reviewed. The patient's tolerance of                            previous anesthesia was also reviewed. The risks                            and benefits of the procedure and the sedation                            options and risks were discussed with the patient.                            All questions were answered, and informed consent                            was obtained. Prior Anticoagulants: The patient has                            taken no previous anticoagulant or antiplatelet                            agents. ASA Grade Assessment: III - A patient with                            severe systemic disease. After reviewing the risks  and benefits, the patient was deemed in                            satisfactory condition to undergo the procedure.                           After obtaining informed consent, the endoscope was                            passed under direct vision. Throughout the                            procedure, the patient's blood pressure,  pulse, and                            oxygen saturations were monitored continuously. The                            GIF-H190 ([id number]) Olympus gastroscope was                            introduced through the mouth, and advanced to the                            second part of duodenum. The upper GI endoscopy was                            accomplished without difficulty. The patient                            tolerated the procedure well. Scope In: Scope Out: Findings:      The examined esophagus was normal.      The Z-line was regular.      Segmental mild inflammation characterized by congestion (edema) and       erythema was found in the gastric antrum.      The cardia and gastric fundus were normal on retroflexion.      The examined duodenum was normal. Impression:               - Normal esophagus.                           - Z-line regular.                           - Acute gastritis.                           - Normal examined duodenum.                           - No specimens collected. Recommendation:           - Full liquid diet. Procedure Code(s):        --- Professional ---  17711, Esophagogastroduodenoscopy, flexible,                            transoral; diagnostic, including collection of                            specimen(s) by brushing or washing, when performed                            (separate procedure) Diagnosis Code(s):        --- Professional ---                           K92.1, Melena (includes Hematochezia)                           K29.00, Acute gastritis without bleeding CPT copyright 2019 American Medical Association. All rights reserved. The codes documented in this report are preliminary and upon coder review may  be revised to meet current compliance requirements. [name], MD 09/09/2019 12:51:59 PM This report has been signed electronically. Number of Addenda: 0

## 2019-09-09 NOTE — Social Work (Signed)
CSW acknowledging consult for substance use. Pt denies heavy consumption of substances to multiple care team providers. At this time no substance use needs noted.   CSW signing off. Please consult if any additional needs arise.  Alexander Mt, MSW, [place] [employer]

## 2019-09-09 NOTE — Op Note (Signed)
[facility] Patient Name: Brandy Bishop Procedure Date : 09/09/2019 MRN: [id number] Attending MD: [name] , MD Date of Birth: [birth year] CSN: [id number] Age: 59 Admit Type: Inpatient Procedure:                Colonoscopy Indications:              This is the patient's first colonoscopy,                            Hematochezia Providers:                [name], MD, Wynonia Sours, RN, Cherylynn Ridges, Technician, Mora Appl, CRNA Referring MD:             hospital team Medicines:                Propofol per Anesthesia, Monitored Anesthesia Care Complications:            No immediate complications. Estimated Blood Loss:     Estimated blood loss: none. Procedure:                Pre-Anesthesia Assessment:                           - Prior to the procedure, a History and Physical                            was performed, and patient medications and                            allergies were reviewed. The patient's tolerance of                            previous anesthesia was also reviewed. The risks                            and benefits of the procedure and the sedation                            options and risks were discussed with the patient.                            All questions were answered, and informed consent                            was obtained. Prior Anticoagulants: The patient has                            taken no previous anticoagulant or antiplatelet                            agents. ASA Grade Assessment: III - A patient with  severe systemic disease. After reviewing the risks                            and benefits, the patient was deemed in                            satisfactory condition to undergo the procedure.                           After obtaining informed consent, the colonoscope                            was passed under direct vision. Throughout the                             procedure, the patient's blood pressure, pulse, and                            oxygen saturations were monitored continuously. The                            [id number]-H190DL ([id number]) Olympus pediatric colonscope                            was introduced through the anus and advanced to the                            the cecum, identified by appendiceal orifice and                            ileocecal valve. The colonoscopy was performed with                            difficulty due to multiple diverticula in the                            colon, unsatisfactory bowel prep, significant                            looping and a tortuous colon. Successful completion                            of the procedure was aided by straightening and                            shortening the scope to obtain bowel loop                            reduction, applying abdominal pressure and lavage.                            The patient tolerated the procedure well. The  quality of the bowel preparation was                            unsatisfactory. The ileocecal valve, appendiceal                            orifice, and rectum were photographed. Scope In: 12:30:32 PM Scope Out: 12:44:37 PM Scope Withdrawal Time: 0 hours 7 minutes 57 seconds  Total Procedure Duration: 0 hours 14 minutes 5 seconds  Findings:      The perianal and digital rectal examinations were normal.      Multiple small and large-mouthed diverticula were found in the entire       colon.      A 4 mm polyp was found in the cecum. The polyp was sessile. Polypectomy       was not attempted due to inadequate bowel preparation.      Internal hemorrhoids were found during retroflexion. The hemorrhoids       were medium-sized and Grade I (internal hemorrhoids that do not       prolapse). Impression:               - Preparation of the colon was unsatisfactory.                           - Diverticulosis in  the entire examined colon.                           - One 4 mm polyp in the cecum. Resection not                            attempted.                           - Internal hemorrhoids.                           - No specimens collected. Recommendation:           - High fiber diet.                           - Repeat colonoscopy in 6 months for screening                            purposes.                           - Continue present medications. Procedure Code(s):        --- Professional ---                           [id number], Colonoscopy, flexible; diagnostic, including                            collection of specimen(s) by brushing or washing,                            when performed (separate procedure) Diagnosis Code(s):        ---  Professional ---                           K92.1, Melena (includes Hematochezia)                           K63.5, Polyp of colon                           K64.0, First degree hemorrhoids                           K57.30, Diverticulosis of large intestine without                            perforation or abscess without bleeding CPT copyright 2019 American Medical Association. All rights reserved. The codes documented in this report are preliminary and upon coder review may  be revised to meet current compliance requirements. [name], MD 09/09/2019 12:56:54 PM This report has been signed electronically. Number of Addenda: 0

## 2019-09-09 NOTE — Anesthesia Postprocedure Evaluation (Addendum)
Anesthesia Post Note  Patient: Brandy Bishop  Procedure(s) Performed: ESOPHAGOGASTRODUODENOSCOPY (EGD) (N/A ) COLONOSCOPY WITH PROPOFOL (N/A )     Patient location during evaluation: Endoscopy Anesthesia Type: MAC Level of consciousness: awake Pain management: pain level controlled Vital Signs Assessment: post-procedure vital signs reviewed and stable Respiratory status: spontaneous breathing Cardiovascular status: stable Postop Assessment: no apparent nausea or vomiting Anesthetic complications: no   No complications documented.  Last Vitals:  Vitals:   09/09/19 1300 09/09/19 1310  BP: (!) 140/91 (!) 142/83  Pulse: 90 82  Resp: (!) 21 18  Temp:    SpO2: 100% 100%    Last Pain:  Vitals:   09/09/19 1310  TempSrc:   PainSc: 0-No pain                 Eladia Frame

## 2019-09-09 NOTE — Interval H&P Note (Signed)
History and Physical Interval Note:  09/09/2019 11:41 AM  Brandy Bishop  has presented today for surgery, with the diagnosis of anemia, rectal bleeding, weight loss.  The various methods of treatment have been discussed with the patient and family. After consideration of risks, benefits and other options for treatment, the patient has consented to  Procedure(s): ESOPHAGOGASTRODUODENOSCOPY (EGD) (N/A) COLONOSCOPY WITH PROPOFOL (N/A) as a surgical intervention.  The patient's history has been reviewed, patient examined, no change in status, stable for surgery.  I have reviewed the patient's chart and labs.  Questions were answered to the patient's satisfaction.     [name]

## 2019-09-09 NOTE — Anesthesia Procedure Notes (Signed)
Procedure Name: MAC Date/Time: 09/09/2019 12:00 PM Performed by: Jenne Campus, CRNA Pre-anesthesia Checklist: Patient identified, Emergency Drugs available, Suction available and Patient being monitored Oxygen Delivery Method: Simple face mask

## 2019-09-09 NOTE — Brief Op Note (Signed)
See endopro note for details. No active bleeding. Suspect resolved diverticular bleed. Full liquid diet and advance as tolerated. No plans for further GI procedures at this time. Office will arrange f/u with GI. Will sign off. Call if questions.

## 2019-09-10 ENCOUNTER — Encounter (HOSPITAL_COMMUNITY): Payer: Self-pay | Admitting: Gastroenterology

## 2019-11-14 ENCOUNTER — Emergency Department (HOSPITAL_COMMUNITY): Payer: Medicaid Other

## 2019-11-14 ENCOUNTER — Encounter (HOSPITAL_COMMUNITY): Payer: Self-pay | Admitting: Emergency Medicine

## 2019-11-14 ENCOUNTER — Emergency Department (HOSPITAL_COMMUNITY)
Admission: EM | Admit: 2019-11-14 | Discharge: 2019-11-14 | Disposition: A | Discharge status: Home / Self Care | Payer: Medicaid Other | Attending: Emergency Medicine | Admitting: Emergency Medicine

## 2019-11-14 ENCOUNTER — Other Ambulatory Visit: Payer: Self-pay

## 2019-11-14 DIAGNOSIS — S90562A Insect bite (nonvenomous), left ankle, initial encounter: Secondary | ICD-10-CM | POA: Diagnosis present

## 2019-11-14 DIAGNOSIS — S0012XA Contusion of left eyelid and periocular area, initial encounter: Secondary | ICD-10-CM | POA: Insufficient documentation

## 2019-11-14 DIAGNOSIS — Z79899 Other long term (current) drug therapy: Secondary | ICD-10-CM | POA: Diagnosis not present

## 2019-11-14 DIAGNOSIS — R112 Nausea with vomiting, unspecified: Secondary | ICD-10-CM | POA: Insufficient documentation

## 2019-11-14 DIAGNOSIS — J45909 Unspecified asthma, uncomplicated: Secondary | ICD-10-CM | POA: Insufficient documentation

## 2019-11-14 DIAGNOSIS — R300 Dysuria: Secondary | ICD-10-CM | POA: Insufficient documentation

## 2019-11-14 DIAGNOSIS — Y92007 Garden or yard of unspecified non-institutional (private) residence as the place of occurrence of the external cause: Secondary | ICD-10-CM | POA: Insufficient documentation

## 2019-11-14 DIAGNOSIS — W57XXXA Bitten or stung by nonvenomous insect and other nonvenomous arthropods, initial encounter: Secondary | ICD-10-CM | POA: Diagnosis not present

## 2019-11-14 DIAGNOSIS — I10 Essential (primary) hypertension: Secondary | ICD-10-CM | POA: Diagnosis not present

## 2019-11-14 DIAGNOSIS — S0990XA Unspecified injury of head, initial encounter: Secondary | ICD-10-CM | POA: Insufficient documentation

## 2019-11-14 DIAGNOSIS — R5383 Other fatigue: Secondary | ICD-10-CM | POA: Diagnosis not present

## 2019-11-14 DIAGNOSIS — S0011XA Contusion of right eyelid and periocular area, initial encounter: Secondary | ICD-10-CM | POA: Insufficient documentation

## 2019-11-14 LAB — TSH: TSH: 0.997 u[IU]/mL (ref 0.350–4.500)

## 2019-11-14 LAB — COMPREHENSIVE METABOLIC PANEL
ALT: 19 U/L (ref 0–44)
AST: 53 U/L — ABNORMAL HIGH (ref 15–41)
Albumin: 3 g/dL — ABNORMAL LOW (ref 3.5–5.0)
Alkaline Phosphatase: 117 U/L (ref 38–126)
Anion gap: 10 (ref 5–15)
BUN: 5 mg/dL — ABNORMAL LOW (ref 6–20)
CO2: 25 mmol/L (ref 22–32)
Calcium: 8 mg/dL — ABNORMAL LOW (ref 8.9–10.3)
Chloride: 104 mmol/L (ref 98–111)
Creatinine, Ser: 0.54 mg/dL (ref 0.44–1.00)
GFR, Estimated: >60 mL/min (ref >60)
Glucose, Bld: 83 mg/dL (ref 70–99)
Potassium: 3.5 mmol/L (ref 3.5–5.1)
Sodium: 139 mmol/L (ref 135–145)
Total Bilirubin: 0.3 mg/dL (ref 0.3–1.2)
Total Protein: 6.9 g/dL (ref 6.5–8.1)

## 2019-11-14 LAB — URINALYSIS, ROUTINE W REFLEX MICROSCOPIC
Bilirubin Urine: NEGATIVE
Glucose, UA: NEGATIVE mg/dL
Hgb urine dipstick: NEGATIVE
Ketones, ur: NEGATIVE mg/dL
Leukocytes,Ua: NEGATIVE
Nitrite: NEGATIVE
Protein, ur: NEGATIVE mg/dL
Specific Gravity, Urine: 1.003 — ABNORMAL LOW (ref 1.005–1.030)
pH: 5 (ref 5.0–8.0)

## 2019-11-14 LAB — CBC WITH DIFFERENTIAL/PLATELET
Abs Immature Granulocytes: 0.01 10*3/uL (ref 0.00–0.07)
Abs Immature Granulocytes: 0.02 10*3/uL (ref 0.00–0.07)
Basophils Absolute: 0 10*3/uL (ref 0.0–0.1)
Basophils Absolute: 0.1 10*3/uL (ref 0.0–0.1)
Basophils Relative: 1 %
Basophils Relative: 1 %
Eosinophils Absolute: 0.2 10*3/uL (ref 0.0–0.5)
Eosinophils Absolute: 0.2 10*3/uL (ref 0.0–0.5)
Eosinophils Relative: 2 %
Eosinophils Relative: 3 %
HCT: 33.6 % — ABNORMAL LOW (ref 36.0–46.0)
HCT: 37.5 % (ref 36.0–46.0)
Hemoglobin: 11 g/dL — ABNORMAL LOW (ref 12.0–15.0)
Hemoglobin: 11.8 g/dL — ABNORMAL LOW (ref 12.0–15.0)
Immature Granulocytes: 0 %
Immature Granulocytes: 0 %
Lymphocytes Relative: 60 %
Lymphocytes Relative: 61 %
Lymphs Abs: 3.9 10*3/uL (ref 0.7–4.0)
Lymphs Abs: 4 10*3/uL (ref 0.7–4.0)
MCH: 30 pg (ref 26.0–34.0)
MCH: 29.6 pg (ref 26.0–34.0)
MCHC: 32.7 g/dL (ref 30.0–36.0)
MCHC: 31.5 g/dL (ref 30.0–36.0)
MCV: 91.6 fL (ref 80.0–100.0)
MCV: 94.2 fL (ref 80.0–100.0)
Monocytes Absolute: 0.3 10*3/uL (ref 0.1–1.0)
Monocytes Absolute: 0.5 10*3/uL (ref 0.1–1.0)
Monocytes Relative: 5 %
Monocytes Relative: 7 %
Neutro Abs: 2 10*3/uL (ref 1.7–7.7)
Neutro Abs: 1.9 10*3/uL (ref 1.7–7.7)
Neutrophils Relative %: 32 %
Neutrophils Relative %: 28 %
Platelets: 70 10*3/uL — ABNORMAL LOW (ref 150–400)
Platelets: 71 10*3/uL — ABNORMAL LOW (ref 150–400)
RBC: 3.67 MIL/uL — ABNORMAL LOW (ref 3.87–5.11)
RBC: 3.98 MIL/uL (ref 3.87–5.11)
RDW: 16.7 % — ABNORMAL HIGH (ref 11.5–15.5)
RDW: 17 % — ABNORMAL HIGH (ref 11.5–15.5)
WBC: 6.5 10*3/uL (ref 4.0–10.5)
WBC: 6.7 10*3/uL (ref 4.0–10.5)
nRBC: 0 % (ref 0.0–0.2)
nRBC: 0 % (ref 0.0–0.2)

## 2019-11-14 LAB — LIPASE, BLOOD: Lipase: 17 U/L (ref 11–51)

## 2019-11-14 LAB — PROTIME-INR
INR: 1.1 (ref 0.8–1.2)
Prothrombin Time: 14.1 seconds (ref 11.4–15.2)

## 2019-11-14 LAB — VITAMIN B12: Vitamin B-12: 538 pg/mL (ref 180–914)

## 2019-11-14 MED ORDER — HYDROCORTISONE 1 % EX CREA: TOPICAL_CREAM | CUTANEOUS | 0 refills | Status: DC

## 2019-11-14 NOTE — ED Triage Notes (Signed)
To ED via [facility] from home, thinks she was bit by a snake or spider on left ankle, blisters noted, slight swelling

## 2019-11-14 NOTE — ED Notes (Signed)
Patient refused lab draw, until she speaks to Dr.

## 2019-11-14 NOTE — Discharge Instructions (Addendum)
Please apply hydrocortisone cream to your itchy foot twice a day.  Please schedule an appointment as soon as possible with your primary doctor to discuss all of your symptoms.

## 2019-11-14 NOTE — ED Provider Notes (Signed)
[facility] Provider Note   CSN: [id number] Arrival date & time: 11/14/19  1541     History Chief Complaint  Patient presents with  . Insect Bite    Brandy Bishop is a 59 y.o. female with past medical history of asthma, hypertension, alcoholism, polysubstance abuse, seizures who presents with left foot pain.  Patient thinks she was bit by a spider or a snake in the grass while she was outside hanging laundry 2 to 3 days ago.  She has had some itching and development of small, hard blisters on the lateral aspect of her left heel.  Endorses some pain on the bottom of her foot.  Over the last several days she had tried putting "itchy cream" on it, but this has not helped.  Endorses some mild dysuria but no hematuria.  Also states she has been nauseous and vomited once this morning.  Denies fevers at home.  Has had her Covid vaccines.  Patient denies taking substances this morning other than drinking "one beer."  Patient does state she had a seizure a few days ago where she fell on her face, and now has bruising around her eyes.  She did not seek medical attention for this.  She is also lost little weight recently and has been experiencing generalized fatigue.   Foot Pain This is a new problem. The current episode started more than 2 days ago. The problem occurs constantly. The problem has been gradually worsening. Pertinent negatives include no chest pain, no abdominal pain and no shortness of breath. Nothing aggravates the symptoms. Nothing relieves the symptoms. The treatment provided no relief.       Past Medical History:  Diagnosis Date  . Alcoholism ([place]) 2011  . Asthma   . Chronic lower back pain   . Colitis 04/2014.   Bloody diarrhea  . Daily headache   . GIB (gastrointestinal bleeding)   . Hypertension   . Pancreatitis    Chronic pancreatitis noted on CT scan in 04/2014.  Marland Kitchen Polysubstance abuse ([place]) 2011  . Pyelonephritis 05/2012    . Seizures ([place])    last seizure was 04/2019 per patient     Patient Active Problem List   Diagnosis Date Noted  . Acute GI bleeding 09/08/2019  . Polysubstance abuse ([place]) 09/08/2019  . Gastroesophageal reflux disease with esophagitis   . Seizure ([place]) 05/30/2015  . Cough   . Subtherapeutic phenytoin level   . Acute upper GI bleed   . Hypomagnesemia 04/16/2014  . Hypokalemia 04/16/2014  . Thrombocytopenia ([place]) 04/16/2014  . Hepatic steatosis 04/15/2014  . Alcohol abuse 04/15/2014  . Chronic pancreatitis ([place]) 04/15/2014  . Protein-calorie malnutrition, severe ([place]) 04/15/2014  . Severe protein-calorie malnutrition ([place]) 04/15/2014  . Bleeding gastrointestinal   . Abnormal CT scan, colon   . Rectal bleeding   . Abdominal pain   . Alcoholic hepatitis without ascites   . GI bleed 04/14/2014  . Viral URI with cough 05/07/2012  . Pyelonephritis 05/07/2012  . Seizure disorder ([place]) 05/07/2012  . Asthma with bronchitis 05/07/2012  . HTN (hypertension) 05/07/2012    Past Surgical History:  Procedure Laterality Date  . COLONOSCOPY WITH PROPOFOL N/A 09/09/2019   Procedure: COLONOSCOPY WITH PROPOFOL;  Surgeon: Wilford Corner, MD;  Location: [facility];  Service: Endoscopy;  Laterality: N/A;  . ESOPHAGOGASTRODUODENOSCOPY N/A 05/31/2015   Procedure: ESOPHAGOGASTRODUODENOSCOPY (EGD);  Surgeon: Irene Shipper, MD;  Location: [facility];  Service: Endoscopy;  Laterality: N/A;  . ESOPHAGOGASTRODUODENOSCOPY  N/A 09/09/2019   Procedure: ESOPHAGOGASTRODUODENOSCOPY (EGD);  Surgeon: Wilford Corner, MD;  Location: [facility];  Service: Endoscopy;  Laterality: N/A;  . NO PAST SURGERIES    . ORIF HUMERUS FRACTURE Right 05/12/2019   Procedure: OPEN REDUCTION INTERNAL FIXATION (ORIF) DISTAL HUMERUS FRACTURE;  Surgeon: Hiram Gash, MD;  Location: [facility];  Service: Orthopedics;  Laterality: Right;     OB History   No obstetric history on file.     Family History  Problem Relation Age  of Onset  . Cancer Mother   . Cancer Brother     Social History   Tobacco Use  . Smoking status: Never Smoker  . Smokeless tobacco: Never Used  Vaping Use  . Vaping Use: Never used  Substance Use Topics  . Alcohol use: Yes    Alcohol/week: 10.0 standard drinks    Types: 10 Cans of beer per week    Comment: "2 -40 ounces a day "  . Drug use: No    Home Medications Prior to Admission medications   Medication Sig Start Date End Date Taking? Authorizing Provider  albuterol (PROVENTIL HFA;VENTOLIN HFA) 108 (90 Base) MCG/ACT inhaler Inhale 1-2 puffs into the lungs every 6 (six) hours as needed for wheezing or shortness of breath.    [provider]  amLODipine (NORVASC) 10 MG tablet Take 10 mg by mouth daily. 08/13/19   [provider]  folic acid (FOLVITE) 1 MG tablet Take 1 tablet (1 mg total) by mouth daily. 09/10/19   Nolberto Hanlon, MD  hydrocortisone cream 1 % Apply to affected area 2 times daily 11/14/19   Asencion Noble, MD  hydrOXYzine (ATARAX/VISTARIL) 25 MG tablet Take 25 mg by mouth at bedtime. 04/05/19   [provider]  omeprazole (PRILOSEC) 40 MG capsule Take 1 capsule (40 mg total) by mouth daily. 09/09/19   Nolberto Hanlon, MD  sucralfate (CARAFATE) 1 GM/10ML suspension Take 10 mLs (1 g total) by mouth 4 (four) times daily -  with meals and at bedtime. Patient not taking: Reported on 09/08/2019 07/30/19   Quintella Reichert, MD  thiamine 100 MG tablet Take 1 tablet (100 mg total) by mouth daily. 09/10/19   Nolberto Hanlon, MD  triamcinolone ointment (KENALOG) 0.1 % Apply 1 application topically 2 (two) times daily as needed (rash).  05/20/19   [provider]    Allergies    Penicillins  Review of Systems   Review of Systems  Constitutional: Negative for chills and fever.  HENT: Negative for ear pain and sore throat.   Eyes: Negative for pain and visual disturbance.  Respiratory: Negative for cough and shortness of breath.   Cardiovascular: Negative  for chest pain and palpitations.  Gastrointestinal: Positive for nausea and vomiting. Negative for abdominal pain.  Genitourinary: Positive for dysuria. Negative for hematuria.  Musculoskeletal: Negative for arthralgias and back pain.  Skin: Positive for rash. Negative for color change.  Neurological: Negative for seizures, syncope and weakness.  All other systems reviewed and are negative.   Physical Exam Updated Vital Signs BP 130/83 (BP Location: Right Arm)   Pulse 90   Temp 98.5 F (36.9 C) (Oral)   Resp 14   Ht 5\' 3"  (1.6 m)   Wt 55.3 kg   SpO2 95%   BMI 21.61 kg/m   Physical Exam Vitals and nursing note reviewed.  Constitutional:      General: She is not in acute distress.    Appearance: She is well-developed.  Comments: Chronically ill-appearing, thin.  HENT:     Head: Normocephalic and atraumatic.  Eyes:     Conjunctiva/sclera: Conjunctivae normal.     Comments: Bilateral periorbital ecchymosis  Cardiovascular:     Rate and Rhythm: Normal rate and regular rhythm.     Heart sounds: No murmur heard.   Pulmonary:     Effort: Pulmonary effort is normal. No respiratory distress.     Breath sounds: Normal breath sounds.  Abdominal:     Palpations: Abdomen is soft.     Tenderness: There is no abdominal tenderness.  Musculoskeletal:     Cervical back: Neck supple.  Skin:    General: Skin is warm and dry.     Comments: No ecchymosis visualized near the ankle.  Patient does have a small scab with surrounding blister at the base of her left lateral ankle with additional small blisters at the lateral edge of her heel.  She does not have any swelling, warmth, or color change.  Neurological:     Mental Status: She is oriented to person, place, and time.     Comments: Equal strength in bilateral lower extremities.     ED Results / Procedures / Treatments   Labs (all labs ordered are listed, but only abnormal results are displayed) Labs Reviewed  CBC WITH  DIFFERENTIAL/PLATELET - Abnormal; Notable for the following components:      Result Value   RBC 3.67 (*)    Hemoglobin 11.0 (*)    HCT 33.6 (*)    RDW 16.7 (*)    Platelets 70 (*)    All other components within normal limits  COMPREHENSIVE METABOLIC PANEL - Abnormal; Notable for the following components:   BUN 5 (*)    Calcium 8.0 (*)    Albumin 3.0 (*)    AST 53 (*)    All other components within normal limits  URINALYSIS, ROUTINE W REFLEX MICROSCOPIC - Abnormal; Notable for the following components:   Color, Urine STRAW (*)    Specific Gravity, Urine 1.003 (*)    All other components within normal limits  CBC WITH DIFFERENTIAL/PLATELET - Abnormal; Notable for the following components:   Hemoglobin 11.8 (*)    RDW 17.0 (*)    Platelets 71 (*)    All other components within normal limits  PROTIME-INR  LIPASE, BLOOD  TSH  VITAMIN B12  ETHANOL  AMMONIA    EKG None  Radiology CT Head Wo Contrast  Result Date: 11/14/2019 CLINICAL DATA:  59 year old female with seizure. EXAM: CT HEAD WITHOUT CONTRAST TECHNIQUE: Contiguous axial images were obtained from the base of the skull through the vertex without intravenous contrast. COMPARISON:  Head CT dated 05/29/2015. 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 There is mild age-related atrophy and chronic microvascular ischemic changes. There is no acute intracranial hemorrhage. No mass effect or midline shift. No extra-axial fluid collection. Vascular: No hyperdense vessel or unexpected calcification. Skull: Normal. Negative for fracture or focal lesion. Sinuses/Orbits: There is mild mucoperiosteal thickening of paranasal sinuses. The mastoid air cells are clear. No air-fluid level. Other: None IMPRESSION: 1. No acute intracranial pathology. 2. Mild age-related atrophy and chronic microvascular ischemic changes. 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 Electronically Signed   By: Anner Crete M.D.   On: 11/14/2019 20:04   DG Chest Portable 1 View  Result Date: 11/14/2019 CLINICAL DATA:  Fatigue.  Snake bite. EXAM: PORTABLE CHEST 1 VIEW COMPARISON:  09/08/2019 FINDINGS: The  heart size and mediastinal contours are within normal limits. Both lungs are clear. The visualized skeletal structures are unremarkable. IMPRESSION: No active disease. Electronically Signed   By: Marlaine Hind M.D.   On: 11/14/2019 20:57    Procedures Procedures (including critical care time)  Medications Ordered in ED Medications - No data to display  ED Course  I have reviewed the triage vital signs and the nursing notes.  Pertinent labs & imaging results that were available during my care of the patient were reviewed by me and considered in my medical decision making (see chart for details).    MDM Rules/Calculators/A&P                         I have a low suspicion for a dangerous snake or spider bite based on the appearance of patient's foot and the timeframe of the incident.  Not infectious appearing.  Will prescribe hydrocortisone cream for itchiness and encourage PCP follow-up for continued symptoms.    Broader work-up pursued due to patient's fatigue, recent seizure, and overall chronic ill appearance. Patient is thrombocytopenic to 61, which is a change from recent labs, but patient does have multiple episodes of thrombocytopenia in the past.  Regarding her liver function, no transaminitis and INR within normal limits.  TSH, B12 within normal limits.  UA not consistent with UTI.  Head CT negative for intracranial bleeding.  Instructed patient to follow-up with her PCP for her more chronic symptoms.  Patient agreeable to this plan.  She is stable for discharge at this time.  This patient was seen with Dr. Reather Converse.  Final Clinical Impression(s) / ED Diagnoses Final diagnoses:  Fatigue, unspecified type    Rx / DC Orders ED Discharge Orders         Ordered    hydrocortisone cream 1 %         11/14/19 2258           Asencion Noble, MD 11/15/19 0030    Elnora Morrison, MD 11/16/19 2257

## 2019-11-25 ENCOUNTER — Other Ambulatory Visit: Payer: Self-pay | Admitting: Family Medicine

## 2019-11-25 DIAGNOSIS — Z1231 Encounter for screening mammogram for malignant neoplasm of breast: Secondary | ICD-10-CM

## 2020-01-12 ENCOUNTER — Ambulatory Visit: Payer: Medicaid Other

## 2020-01-15 ENCOUNTER — Emergency Department (HOSPITAL_COMMUNITY)
Admission: EM | Admit: 2020-01-15 | Discharge: 2020-01-15 | Disposition: A | Discharge status: Home / Self Care | Payer: Medicaid Other | Attending: Emergency Medicine | Admitting: Emergency Medicine

## 2020-01-15 ENCOUNTER — Emergency Department (HOSPITAL_COMMUNITY): Payer: Medicaid Other

## 2020-01-15 DIAGNOSIS — S0181XA Laceration without foreign body of other part of head, initial encounter: Secondary | ICD-10-CM | POA: Insufficient documentation

## 2020-01-15 DIAGNOSIS — S0993XA Unspecified injury of face, initial encounter: Secondary | ICD-10-CM

## 2020-01-15 DIAGNOSIS — I10 Essential (primary) hypertension: Secondary | ICD-10-CM | POA: Diagnosis not present

## 2020-01-15 DIAGNOSIS — J45909 Unspecified asthma, uncomplicated: Secondary | ICD-10-CM | POA: Insufficient documentation

## 2020-01-15 DIAGNOSIS — S0990XA Unspecified injury of head, initial encounter: Secondary | ICD-10-CM | POA: Insufficient documentation

## 2020-01-15 DIAGNOSIS — Z79899 Other long term (current) drug therapy: Secondary | ICD-10-CM | POA: Diagnosis not present

## 2020-01-15 DIAGNOSIS — F10121 Alcohol abuse with intoxication delirium: Secondary | ICD-10-CM | POA: Insufficient documentation

## 2020-01-15 DIAGNOSIS — W19XXXA Unspecified fall, initial encounter: Secondary | ICD-10-CM | POA: Diagnosis not present

## 2020-01-15 DIAGNOSIS — F10921 Alcohol use, unspecified with intoxication delirium: Secondary | ICD-10-CM

## 2020-01-15 MED ORDER — ZIPRASIDONE MESYLATE 20 MG IM SOLR
20.0000 mg | Freq: Once | INTRAMUSCULAR | Status: AC
  Administered 2020-01-15: 12:00:00 20 mg via INTRAMUSCULAR
  Filled 2020-01-15: qty 20

## 2020-01-15 MED ORDER — LORAZEPAM 2 MG/ML IJ SOLN
2.0000 mg | Freq: Once | INTRAMUSCULAR | Status: AC
  Administered 2020-01-15: 12:00:00 2 mg via INTRAMUSCULAR
  Filled 2020-01-15: qty 1

## 2020-01-15 MED ORDER — STERILE WATER FOR INJECTION IJ SOLN
INTRAMUSCULAR | Status: AC
  Filled 2020-01-15: qty 10

## 2020-01-15 MED ORDER — LIDOCAINE-EPINEPHRINE (PF) 2 %-1:200000 IJ SOLN
20.0000 mL | Freq: Once | INTRAMUSCULAR | Status: AC
  Administered 2020-01-15: 20 mL
  Filled 2020-01-15: qty 20

## 2020-01-15 NOTE — ED Notes (Signed)
Placed on 4 L of oxygen via nasal cannula, as oxygen saturation dropped to 82% while patient sleeping.

## 2020-01-15 NOTE — ED Notes (Addendum)
Moaning heard coming from pts room, this writer entered to see if pt needed assistance. Pt found on floor in doorway. Bleeding from forehead. MD Eulis Foster called to bedside. C-collar applied and pt transferred back to bed.

## 2020-01-15 NOTE — ED Notes (Signed)
Pt lying supine, asleep.  Restraints removed, sitter at bedside. Will continue to monitor.

## 2020-01-15 NOTE — ED Triage Notes (Signed)
Transported by [employer] from [employer]-- experienced a fall and patient's boyfriend on the scene reported to EMS that patient has consumed "a lot" of alcohol as of today. Patient removed C-Collar in route and has been uncooperative with EMS. C/o back pain. Hx of seizures. Has hematoma to left upper eyelid, bleeding controlled.

## 2020-01-15 NOTE — ED Notes (Signed)
Pt provided sandwich and juice pt alert and orientinted at this time.Marland Kitchen

## 2020-01-15 NOTE — Discharge Instructions (Addendum)
Please return for any problem.  °

## 2020-01-15 NOTE — ED Notes (Signed)
Pt alert oriented and ambulatory upon discharge. Cab called for pt.

## 2020-01-15 NOTE — ED Provider Notes (Signed)
Patient seen after prior ED provider.  Patient is still sleeping - however, she is arousable with verbal stimulation.  She is alert with questioning.  She appears to be improving. She denies acute complaint. She does not yet appear to be sober enough to ambulate safely.  Will reevaluate.   Valarie Merino, MD 01/15/20 [phone]

## 2020-01-15 NOTE — ED Notes (Signed)
Sitter is now at bedside sitting with patient.

## 2020-01-15 NOTE — ED Notes (Signed)
Upon entering the room, found patient on the floor; charge nurse called to bedside. Fall bracelet placed on patient and bed alarm activated. Verbal redirection not understood by patient. Myself as well as other staff members proceeded to lift patient off the floor and placed her back into bed. Restraints placed on patient.   Spoke with patient's husband who stated that this is a daily occurrence for patient; he states that patient is a daily drinker and that he wants her to seek help. He states that she experiences falls daily and that the behavior is out of control.   Will do safety zone.

## 2020-01-15 NOTE — ED Notes (Signed)
Called Dr Eulis Foster to bedside as patient is attempting to leave emergency department. Security called to bedside.

## 2020-01-15 NOTE — ED Provider Notes (Addendum)
[facility] DEPT Provider Note   CSN: [id number] Arrival date & time: 01/15/20  1030     History Chief Complaint  Patient presents with  . Fall    Brandy Bishop is a 59 y.o. female.  HPI Patient evaluated by me at 10:44 AM.  At this time she sitting edge of the bed, and indicating that she wants to leave the hospital prior to treatment.  Patient has a large laceration and contusion of the left periorbital tissue.  She is ataxic while sitting on the stretcher.  ED attendant is with her attempting to get her in a gown and ready for treatment.  I told the patient she would have to stay and get treatment.  She seemed agreeable.  I ordered restraints as needed.  Imaging ordered.  Level 5 caveat-altered mental status    Past Medical History:  Diagnosis Date  . Alcoholism ([place]) 2011  . Asthma   . Chronic lower back pain   . Colitis 04/2014.   Bloody diarrhea  . Daily headache   . GIB (gastrointestinal bleeding)   . Hypertension   . Pancreatitis    Chronic pancreatitis noted on CT scan in 04/2014.  Marland Kitchen Polysubstance abuse ([place]) 2011  . Pyelonephritis 05/2012  . Seizures ([place])    last seizure was 04/2019 per patient     Patient Active Problem List   Diagnosis Date Noted  . Acute GI bleeding 09/08/2019  . Polysubstance abuse ([place]) 09/08/2019  . Gastroesophageal reflux disease with esophagitis   . Seizure ([place]) 05/30/2015  . Cough   . Subtherapeutic phenytoin level   . Acute upper GI bleed   . Hypomagnesemia 04/16/2014  . Hypokalemia 04/16/2014  . Thrombocytopenia ([place]) 04/16/2014  . Hepatic steatosis 04/15/2014  . Alcohol abuse 04/15/2014  . Chronic pancreatitis ([place]) 04/15/2014  . Protein-calorie malnutrition, severe ([place]) 04/15/2014  . Severe protein-calorie malnutrition ([place]) 04/15/2014  . Bleeding gastrointestinal   . Abnormal CT scan, colon   . Rectal bleeding   . Abdominal pain   . Alcoholic hepatitis without ascites   . GI bleed  04/14/2014  . Viral URI with cough 05/07/2012  . Pyelonephritis 05/07/2012  . Seizure disorder ([place]) 05/07/2012  . Asthma with bronchitis 05/07/2012  . HTN (hypertension) 05/07/2012    Past Surgical History:  Procedure Laterality Date  . COLONOSCOPY WITH PROPOFOL N/A 09/09/2019   Procedure: COLONOSCOPY WITH PROPOFOL;  Surgeon: Wilford Corner, MD;  Location: [facility];  Service: Endoscopy;  Laterality: N/A;  . ESOPHAGOGASTRODUODENOSCOPY N/A 05/31/2015   Procedure: ESOPHAGOGASTRODUODENOSCOPY (EGD);  Surgeon: Irene Shipper, MD;  Location: [facility] ENDOSCOPY;  Service: Endoscopy;  Laterality: N/A;  . ESOPHAGOGASTRODUODENOSCOPY N/A 09/09/2019   Procedure: ESOPHAGOGASTRODUODENOSCOPY (EGD);  Surgeon: Wilford Corner, MD;  Location: [facility];  Service: Endoscopy;  Laterality: N/A;  . NO PAST SURGERIES    . ORIF HUMERUS FRACTURE Right 05/12/2019   Procedure: OPEN REDUCTION INTERNAL FIXATION (ORIF) DISTAL HUMERUS FRACTURE;  Surgeon: Hiram Gash, MD;  Location: [facility];  Service: Orthopedics;  Laterality: Right;     OB History   No obstetric history on file.     Family History  Problem Relation Age of Onset  . Cancer Mother   . Cancer Brother     Social History   Tobacco Use  . Smoking status: Never Smoker  . Smokeless tobacco: Never Used  Vaping Use  . Vaping Use: Never used  Substance Use Topics  . Alcohol use: Yes    Alcohol/week: 10.0  standard drinks    Types: 10 Cans of beer per week    Comment: "2 -40 ounces a day "  . Drug use: No    Home Medications Prior to Admission medications   Medication Sig Start Date End Date Taking? Authorizing Provider  albuterol (PROVENTIL HFA;VENTOLIN HFA) 108 (90 Base) MCG/ACT inhaler Inhale 1-2 puffs into the lungs every 6 (six) hours as needed for wheezing or shortness of breath.   Yes [provider]  amLODipine (NORVASC) 10 MG tablet Take 10 mg by mouth daily. 08/13/19  Yes [provider]  cetirizine (ZYRTEC) 10 MG  tablet Take 10 mg by mouth daily. 12/28/19  Yes [provider]  folic acid (FOLVITE) 1 MG tablet Take 1 tablet (1 mg total) by mouth daily. 09/10/19  Yes Nolberto Hanlon, MD  hydrochlorothiazide (HYDRODIURIL) 25 MG tablet Take 25 mg by mouth daily. 12/28/19  Yes [provider]  hydrocortisone cream 1 % Apply to affected area 2 times daily 11/14/19  Yes Asencion Noble, MD  hydrOXYzine (ATARAX/VISTARIL) 25 MG tablet Take 25 mg by mouth at bedtime. 04/05/19  Yes [provider]  omeprazole (PRILOSEC) 20 MG capsule Take 20 mg by mouth daily. 10/28/19  Yes [provider]  thiamine 100 MG tablet Take 1 tablet (100 mg total) by mouth daily. 09/10/19  Yes Nolberto Hanlon, MD  traZODone (DESYREL) 50 MG tablet Take 50 mg by mouth at bedtime. 12/28/19  Yes [provider]  triamcinolone ointment (KENALOG) 0.1 % Apply 1 application topically 2 (two) times daily as needed (rash).  05/20/19  Yes [provider]  omeprazole (PRILOSEC) 40 MG capsule Take 1 capsule (40 mg total) by mouth daily. Patient not taking: No sig reported 09/09/19   Nolberto Hanlon, MD  sucralfate (CARAFATE) 1 GM/10ML suspension Take 10 mLs (1 g total) by mouth 4 (four) times daily -  with meals and at bedtime. Patient not taking: No sig reported 07/30/19   Quintella Reichert, MD    Allergies    Penicillins  Review of Systems   Review of Systems  Unable to perform ROS: Mental status change    Physical Exam Updated Vital Signs BP 126/81   Pulse 75   Temp 97.8 F (36.6 C) (Oral)   Resp 14   SpO2 100%   Physical Exam Vitals and nursing note reviewed.  Constitutional:      General: She is not in acute distress.    Appearance: She is well-developed and well-nourished. She is ill-appearing. She is not toxic-appearing or diaphoretic.  HENT:     Head: Normocephalic.     Comments: Contusions and lacerations of both eyebrows.  See laceration repair documentation for details.    Right Ear:  External ear normal.     Left Ear: External ear normal.  Eyes:     Extraocular Movements: EOM normal.     Conjunctiva/sclera: Conjunctivae normal.     Pupils: Pupils are equal, round, and reactive to light.  Neck:     Trachea: Phonation normal.  Cardiovascular:     Rate and Rhythm: Normal rate.  Pulmonary:     Effort: Pulmonary effort is normal. No respiratory distress.     Breath sounds: No stridor.  Chest:     Chest wall: No bony tenderness.  Abdominal:     General: There is no distension.     Palpations: Abdomen is soft.     Tenderness: There is no abdominal tenderness.  Musculoskeletal:  General: Normal range of motion.     Cervical back: Normal range of motion and neck supple.  Skin:    General: Skin is warm, dry and intact.  Neurological:     Mental Status: She is alert.     Cranial Nerves: No cranial nerve deficit.     Sensory: No sensory deficit.     Motor: No abnormal muscle tone.     Coordination: Coordination normal.     Comments: Ataxia and dysarthria are present consistent with alcohol intoxication  Psychiatric:        Mood and Affect: Mood and affect and mood normal.     Comments: She is sleepy     ED Results / Procedures / Treatments   Labs (all labs ordered are listed, but only abnormal results are displayed) Labs Reviewed - No data to display  EKG None    Date: 01/15/2020  Rate: 76  Rhythm: normal sinus rhythm  QRS Axis: normal  PR and QT Intervals: normal  ST/T Wave abnormalities: nonspecific ST changes  PR and QRS Conduction Disutrbances:none    Radiology CT Head Wo Contrast  Result Date: 01/15/2020 CLINICAL DATA:  Fall, alcohol, laceration to head EXAM: CT HEAD WITHOUT CONTRAST CT MAXILLOFACIAL WITHOUT CONTRAST CT CERVICAL SPINE WITHOUT CONTRAST TECHNIQUE: Multidetector CT imaging of the head, cervical spine, and maxillofacial structures were performed using the standard protocol without intravenous contrast. Multiplanar CT image  reconstructions of the cervical spine and maxillofacial structures were also generated. COMPARISON:  CT brain, 11/14/2019 FINDINGS: CT HEAD FINDINGS Brain: No evidence of acute infarction, hemorrhage, hydrocephalus, extra-axial collection or mass lesion/mass effect. 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 Vascular: No hyperdense vessel or unexpected calcification. CT FACIAL BONES FINDINGS Skull: Normal. Negative for fracture or focal lesion. Facial bones: No displaced fractures or dislocations. Sinuses/Orbits: No acute finding. Chronic, depressed fracture of the left lamina papyracea (series 5, image 29). Other: Soft tissue contusion and hematoma of the right forehead (series 4, image 9). Soft tissue contusion of the lateral left orbit (series 3, image 28). CT CERVICAL SPINE FINDINGS Alignment: Normal. Skull base and vertebrae: No acute fracture. No primary bone lesion or focal pathologic process. Soft tissues and spinal canal: No prevertebral fluid or swelling. No visible canal hematoma. Disc levels: Intact. Upper chest: Negative. Other: None. IMPRESSION: 1. No acute intracranial pathology. 2. 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 3. No acute displaced fracture or dislocation of the facial bones. 4. Soft tissue contusion and hematoma of the right forehead. Soft tissue contusion of the lateral left orbit. 5. No fracture or static subluxation of the cervical spine. Electronically Signed   By: Eddie Candle M.D.   On: 01/15/2020 13:28   CT Cervical Spine Wo Contrast  Result Date: 01/15/2020 CLINICAL DATA:  Fall, alcohol, laceration to head EXAM: CT HEAD WITHOUT CONTRAST CT MAXILLOFACIAL WITHOUT CONTRAST CT CERVICAL SPINE WITHOUT CONTRAST TECHNIQUE: Multidetector CT imaging of the head, cervical spine, and maxillofacial structures were performed using the standard protocol without intravenous contrast.  Multiplanar CT image reconstructions of the cervical spine and maxillofacial structures were also generated. COMPARISON:  CT brain, 11/14/2019 FINDINGS: CT HEAD FINDINGS Brain: No evidence of acute infarction, hemorrhage, hydrocephalus, extra-axial collection or mass lesion/mass effect. 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 Vascular: No hyperdense vessel or unexpected calcification. CT FACIAL BONES FINDINGS Skull: Normal. Negative for fracture or focal lesion. Facial bones: No displaced fractures or  dislocations. Sinuses/Orbits: No acute finding. Chronic, depressed fracture of the left lamina papyracea (series 5, image 29). Other: Soft tissue contusion and hematoma of the right forehead (series 4, image 9). Soft tissue contusion of the lateral left orbit (series 3, image 28). CT CERVICAL SPINE FINDINGS Alignment: Normal. Skull base and vertebrae: No acute fracture. No primary bone lesion or focal pathologic process. Soft tissues and spinal canal: No prevertebral fluid or swelling. No visible canal hematoma. Disc levels: Intact. Upper chest: Negative. Other: None. IMPRESSION: 1. No acute intracranial pathology. 2. 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 3. No acute displaced fracture or dislocation of the facial bones. 4. Soft tissue contusion and hematoma of the right forehead. Soft tissue contusion of the lateral left orbit. 5. No fracture or static subluxation of the cervical spine. Electronically Signed   By: Eddie Candle M.D.   On: 01/15/2020 13:28   CT Maxillofacial WO CM  Result Date: 01/15/2020 CLINICAL DATA:  Fall, alcohol, laceration to head EXAM: CT HEAD WITHOUT CONTRAST CT MAXILLOFACIAL WITHOUT CONTRAST CT CERVICAL SPINE WITHOUT CONTRAST TECHNIQUE: Multidetector CT imaging of the head, cervical spine, and maxillofacial structures were performed using the standard protocol without  intravenous contrast. Multiplanar CT image reconstructions of the cervical spine and maxillofacial structures were also generated. COMPARISON:  CT brain, 11/14/2019 FINDINGS: CT HEAD FINDINGS Brain: No evidence of acute infarction, hemorrhage, hydrocephalus, extra-axial collection or mass lesion/mass effect. 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 Vascular: No hyperdense vessel or unexpected calcification. CT FACIAL BONES FINDINGS Skull: Normal. Negative for fracture or focal lesion. Facial bones: No displaced fractures or dislocations. Sinuses/Orbits: No acute finding. Chronic, depressed fracture of the left lamina papyracea (series 5, image 29). Other: Soft tissue contusion and hematoma of the right forehead (series 4, image 9). Soft tissue contusion of the lateral left orbit (series 3, image 28). CT CERVICAL SPINE FINDINGS Alignment: Normal. Skull base and vertebrae: No acute fracture. No primary bone lesion or focal pathologic process. Soft tissues and spinal canal: No prevertebral fluid or swelling. No visible canal hematoma. Disc levels: Intact. Upper chest: Negative. Other: None. IMPRESSION: 1. No acute intracranial pathology. 2. 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 3. No acute displaced fracture or dislocation of the facial bones. 4. Soft tissue contusion and hematoma of the right forehead. Soft tissue contusion of the lateral left orbit. 5. No fracture or static subluxation of the cervical spine. Electronically Signed   By: Eddie Candle M.D.   On: 01/15/2020 13:28    Procedures .Marland KitchenLaceration Repair  Date/Time: 01/15/2020 2:27 PM Performed by: Daleen Bo, MD Authorized by: Daleen Bo, MD   Consent:    Consent obtained:  Emergent situation   Risks, benefits, and alternatives were discussed: not applicable   Universal protocol:    Patient identity confirmed:  Arm  band Anesthesia:    Anesthesia method:  Local infiltration   Local anesthetic:  Lidocaine 2% WITH epi Laceration details:    Location:  Face (Left eyebrow)   Face location:  L eyebrow   Length (cm):  3   Depth (mm):  9 Pre-procedure details:    Preparation:  Patient was prepped and draped in usual sterile fashion Exploration:    Hemostasis achieved with:  Direct pressure   Imaging obtained: x-ray     Imaging outcome: foreign body not noted     Wound exploration: wound explored through full range of motion  Wound extent: no fascia violation noted, no foreign bodies/material noted, no muscle damage noted and no vascular damage noted     Contaminated: no   Treatment:    Area cleansed with:  Povidone-iodine   Amount of cleaning:  Standard   Irrigation solution:  Sterile water   Irrigation method:  Syringe   Visualized foreign bodies/material removed: no     Debridement:  None   Undermining:  None   Scar revision: no   Skin repair:    Repair method:  Sutures   Suture size:  4-0   Suture material:  Prolene   Suture technique:  Simple interrupted   Number of sutures:  3 Approximation:    Approximation:  Loose Repair type:    Repair type:  Simple Post-procedure details:    Dressing:  Antibiotic ointment   Procedure completion:  Tolerated well, no immediate complications .Marland KitchenLaceration Repair  Date/Time: 01/15/2020 2:30 PM Performed by: Daleen Bo, MD Authorized by: Daleen Bo, MD   Consent:    Consent obtained:  Emergent situation Universal protocol:    Patient identity confirmed:  Arm band Anesthesia:    Anesthesia method:  Local infiltration   Local anesthetic:  Lidocaine 2% WITH epi Laceration details:    Location:  Face   Face location:  R eyebrow   Length (cm):  2.2   Depth (mm):  9 Pre-procedure details:    Preparation:  Patient was prepped and draped in usual sterile fashion Exploration:    Hemostasis achieved with:  Direct pressure   Imaging  obtained: x-ray     Wound exploration: wound explored through full range of motion     Wound extent: no fascia violation noted, no foreign bodies/material noted, no muscle damage noted, no underlying fracture noted and no vascular damage noted     Contaminated: no   Treatment:    Area cleansed with:  Povidone-iodine   Amount of cleaning:  Standard   Irrigation solution:  Sterile water   Irrigation method:  Syringe   Visualized foreign bodies/material removed: no     Undermining:  None Skin repair:    Repair method:  Sutures   Suture size:  4-0   Suture material:  Prolene   Number of sutures:  2 Approximation:    Approximation:  Loose Repair type:    Repair type:  Simple .Critical Care Performed by: Daleen Bo, MD Authorized by: Daleen Bo, MD   Critical care provider statement:    Critical care time (minutes):  55   Critical care start time:  01/15/2020 10:45 AM   Critical care end time:  01/15/2020 3:50 PM   Critical care time was exclusive of:  Separately billable procedures and treating other patients   Critical care was time spent personally by me on the following activities:  Blood draw for specimens, development of treatment plan with patient or surrogate, discussions with consultants, evaluation of patient's response to treatment, examination of patient, obtaining history from patient or surrogate, ordering and performing treatments and interventions, ordering and review of laboratory studies, pulse oximetry, re-evaluation of patient's condition, review of old charts and ordering and review of radiographic studies   (including critical care time)  Medications Ordered in ED Medications  ziprasidone (GEODON) injection 20 mg (20 mg Intramuscular Given 01/15/20 1216)  LORazepam (ATIVAN) injection 2 mg (2 mg Intramuscular Given 01/15/20 1217)  sterile water (preservative free) injection (  Given by Other 01/15/20 1246)  lidocaine-EPINEPHrine (XYLOCAINE W/EPI) 2  %-1:200000 (PF) injection 20 mL (  20 mLs Infiltration Given by Other 01/15/20 1432)    ED Course  I have reviewed the triage vital signs and the nursing notes.  Pertinent labs & imaging results that were available during my care of the patient were reviewed by me and considered in my medical decision making (see chart for details).  Clinical Course as of 01/15/20 1948  Sat Jan 15, 2020  1134 The patient was not being observed, and was found on the floor.  She appears to have a new injury over her right eye, laceration.  Is alert, but moaning in pain.  She is repeatedly asking to go home.  She does not appear to have any deformities or injuries to the arms or legs.  I placed a cervical collar on her neck, and asked staff to place her back in bed.  She has not yet had imaging.  I will proceed with the same imaging work-up which was ordered, about 50 minutes ago.  Staff instructed to monitor the patient closely. [EW]  1208 She continues to be agitated.  She is resisting efforts to help her self and demanding to leave, cursing, despite being restrained with soft restraints at the wrists.  Sedating medications ordered to proceed with evaluation/treatment. [EW]  1431 Patient slept through wound care/suturing. [EW]    Clinical Course User Index [EW] Daleen Bo, MD   MDM Rules/Calculators/A&P                           Patient Vitals for the past 24 hrs:  BP Temp Temp src Pulse Resp SpO2  01/15/20 1830 126/81 -- -- 75 14 100 %  01/15/20 1815 123/78 -- -- 74 16 100 %  01/15/20 1800 123/79 -- -- 79 17 100 %  01/15/20 1745 103/73 -- -- 72 16 100 %  01/15/20 1730 102/75 -- -- 71 15 100 %  01/15/20 1715 99/70 -- -- 73 15 100 %  01/15/20 1700 111/74 -- -- 76 14 100 %  01/15/20 1645 116/80 -- -- 78 15 100 %  01/15/20 1630 128/82 -- -- 76 -- 100 %  01/15/20 1615 109/75 -- -- 78 18 100 %  01/15/20 1600 113/75 -- -- 81 -- 100 %  01/15/20 1545 117/79 -- -- 74 -- 100 %  01/15/20 1530 127/85 -- -- 80  -- 100 %  01/15/20 1515 127/84 -- -- 81 -- 100 %  01/15/20 1500 114/73 -- -- 71 18 100 %  01/15/20 1445 116/78 -- -- 74 -- 100 %  01/15/20 1430 108/66 -- -- 83 -- 100 %  01/15/20 1415 140/81 -- -- 99 20 100 %  01/15/20 1400 119/82 -- -- 85 -- 100 %  01/15/20 1345 128/81 -- -- 89 -- 100 %  01/15/20 1330 104/69 -- -- 85 -- 100 %  01/15/20 1315 97/64 -- -- 82 -- 100 %  01/15/20 1307 (!) 99/58 -- -- 90 18 99 %  01/15/20 1300 -- -- -- 99 -- (!) 71 %  01/15/20 1230 -- -- -- 83 15 97 %  01/15/20 1215 (!) 139/129 -- -- 81 20 98 %  01/15/20 1200 -- -- -- (!) 101 -- 99 %  01/15/20 1145 (!) 144/102 -- -- 90 (!) 24 98 %  01/15/20 1115 113/77 -- -- 71 -- 100 %  01/15/20 1100 127/89 -- -- 83 -- 97 %  01/15/20 1053 (!) 128/93 97.8 F (36.6 C) Oral 85 16 99 %  Medical Decision Making:  This patient is presenting for evaluation of injuries from fall suspected alcohol intoxication, which does require a range of treatment options, and is a complaint that involves a high risk of morbidity and mortality. The differential diagnoses include head injury, facial fracture, intracranial injury, neck injury. I decided to review old records, and in summary middle-aged female presenting with apparent alcohol intoxication and injuries from fall.  I did not get additional historical information from anyone.   Radiologic Tests Ordered, included CT images of head, face and cervical spine.  I independently Visualized: CT images, which show no acute injuries    Critical Interventions-clinical evaluation, radiologic imaging, assessment after fall in the emergency department, observation and reassessment.  Sedation or restraint to protect patient from further falls.  After These Interventions, the Patient was reevaluated and was found to be sedated requiring a prolonged period of observation in the emergency department  CRITICAL CARE-yes Performed by: Daleen Bo  Nursing Notes Reviewed/ Care  Coordinated Applicable Imaging Reviewed Interpretation of Laboratory Data incorporated into ED treatment     Final Clinical Impression(s) / ED Diagnoses Final diagnoses:  Alcohol intoxication with delirium ([place])  Fall, initial encounter  Injury of head, initial encounter  Facial laceration, initial encounter  Facial injury, initial encounter    Rx / DC Orders ED Discharge Orders    None       Daleen Bo, MD 01/15/20 Marjo Bicker    Daleen Bo, MD 02/05/20 1408

## 2020-06-08 ENCOUNTER — Inpatient Hospital Stay (HOSPITAL_COMMUNITY)
Admission: EM | Admit: 2020-06-08 | Discharge: 2020-06-11 | DRG: 441 | Discharge status: Against Medical Advice | Payer: Medicaid Other | Attending: Internal Medicine | Admitting: Internal Medicine

## 2020-06-08 ENCOUNTER — Emergency Department (HOSPITAL_COMMUNITY): Payer: Medicaid Other

## 2020-06-08 ENCOUNTER — Encounter (HOSPITAL_COMMUNITY): Payer: Self-pay | Admitting: *Deleted

## 2020-06-08 DIAGNOSIS — Y903 Blood alcohol level of 60-79 mg/100 ml: Secondary | ICD-10-CM | POA: Diagnosis present

## 2020-06-08 DIAGNOSIS — K7200 Acute and subacute hepatic failure without coma: Secondary | ICD-10-CM | POA: Diagnosis not present

## 2020-06-08 DIAGNOSIS — D696 Thrombocytopenia, unspecified: Secondary | ICD-10-CM | POA: Diagnosis present

## 2020-06-08 DIAGNOSIS — R54 Age-related physical debility: Secondary | ICD-10-CM | POA: Diagnosis present

## 2020-06-08 DIAGNOSIS — Z79899 Other long term (current) drug therapy: Secondary | ICD-10-CM | POA: Diagnosis not present

## 2020-06-08 DIAGNOSIS — K860 Alcohol-induced chronic pancreatitis: Secondary | ICD-10-CM

## 2020-06-08 DIAGNOSIS — K5750 Diverticulosis of both small and large intestine without perforation or abscess without bleeding: Secondary | ICD-10-CM | POA: Diagnosis present

## 2020-06-08 DIAGNOSIS — R7989 Other specified abnormal findings of blood chemistry: Secondary | ICD-10-CM

## 2020-06-08 DIAGNOSIS — R1031 Right lower quadrant pain: Secondary | ICD-10-CM | POA: Diagnosis not present

## 2020-06-08 DIAGNOSIS — Z809 Family history of malignant neoplasm, unspecified: Secondary | ICD-10-CM

## 2020-06-08 DIAGNOSIS — E876 Hypokalemia: Secondary | ICD-10-CM | POA: Diagnosis present

## 2020-06-08 DIAGNOSIS — F1020 Alcohol dependence, uncomplicated: Secondary | ICD-10-CM | POA: Diagnosis present

## 2020-06-08 DIAGNOSIS — K861 Other chronic pancreatitis: Secondary | ICD-10-CM | POA: Diagnosis present

## 2020-06-08 DIAGNOSIS — Z5329 Procedure and treatment not carried out because of patient's decision for other reasons: Secondary | ICD-10-CM | POA: Diagnosis present

## 2020-06-08 DIAGNOSIS — I700 Atherosclerosis of aorta: Secondary | ICD-10-CM | POA: Diagnosis present

## 2020-06-08 DIAGNOSIS — K529 Noninfective gastroenteritis and colitis, unspecified: Secondary | ICD-10-CM | POA: Diagnosis present

## 2020-06-08 DIAGNOSIS — G8929 Other chronic pain: Secondary | ICD-10-CM | POA: Diagnosis present

## 2020-06-08 DIAGNOSIS — E43 Unspecified severe protein-calorie malnutrition: Secondary | ICD-10-CM | POA: Diagnosis present

## 2020-06-08 DIAGNOSIS — Z880 Allergy status to penicillin: Secondary | ICD-10-CM | POA: Diagnosis not present

## 2020-06-08 DIAGNOSIS — F1910 Other psychoactive substance abuse, uncomplicated: Secondary | ICD-10-CM | POA: Diagnosis present

## 2020-06-08 DIAGNOSIS — E8809 Other disorders of plasma-protein metabolism, not elsewhere classified: Secondary | ICD-10-CM | POA: Diagnosis present

## 2020-06-08 DIAGNOSIS — K2970 Gastritis, unspecified, without bleeding: Secondary | ICD-10-CM | POA: Diagnosis present

## 2020-06-08 DIAGNOSIS — K760 Fatty (change of) liver, not elsewhere classified: Secondary | ICD-10-CM | POA: Diagnosis present

## 2020-06-08 DIAGNOSIS — Z6821 Body mass index (BMI) 21.0-21.9, adult: Secondary | ICD-10-CM | POA: Diagnosis not present

## 2020-06-08 DIAGNOSIS — Z20822 Contact with and (suspected) exposure to covid-19: Secondary | ICD-10-CM | POA: Diagnosis present

## 2020-06-08 DIAGNOSIS — I959 Hypotension, unspecified: Secondary | ICD-10-CM | POA: Diagnosis present

## 2020-06-08 DIAGNOSIS — F1010 Alcohol abuse, uncomplicated: Secondary | ICD-10-CM | POA: Diagnosis not present

## 2020-06-08 DIAGNOSIS — R64 Cachexia: Secondary | ICD-10-CM | POA: Diagnosis present

## 2020-06-08 DIAGNOSIS — E860 Dehydration: Secondary | ICD-10-CM | POA: Diagnosis present

## 2020-06-08 DIAGNOSIS — I10 Essential (primary) hypertension: Secondary | ICD-10-CM | POA: Diagnosis present

## 2020-06-08 DIAGNOSIS — K7010 Alcoholic hepatitis without ascites: Secondary | ICD-10-CM | POA: Diagnosis present

## 2020-06-08 DIAGNOSIS — M545 Low back pain, unspecified: Secondary | ICD-10-CM | POA: Diagnosis present

## 2020-06-08 DIAGNOSIS — G40909 Epilepsy, unspecified, not intractable, without status epilepticus: Secondary | ICD-10-CM

## 2020-06-08 LAB — MAGNESIUM: Magnesium: 0.6 mg/dL — CL (ref 1.7–2.4)

## 2020-06-08 LAB — COMPREHENSIVE METABOLIC PANEL
ALT: 54 U/L — ABNORMAL HIGH (ref 0–44)
AST: 184 U/L — ABNORMAL HIGH (ref 15–41)
Albumin: 3.2 g/dL — ABNORMAL LOW (ref 3.5–5.0)
Alkaline Phosphatase: 185 U/L — ABNORMAL HIGH (ref 38–126)
Anion gap: 28 — ABNORMAL HIGH (ref 5–15)
BUN: <5 mg/dL — ABNORMAL LOW (ref 6–20)
CO2: 17 mmol/L — ABNORMAL LOW (ref 22–32)
Calcium: 6.4 mg/dL — CL (ref 8.9–10.3)
Chloride: 95 mmol/L — ABNORMAL LOW (ref 98–111)
Creatinine, Ser: 0.8 mg/dL (ref 0.44–1.00)
GFR, Estimated: >60 mL/min (ref >60)
Glucose, Bld: 89 mg/dL (ref 70–99)
Potassium: 3.2 mmol/L — ABNORMAL LOW (ref 3.5–5.1)
Sodium: 140 mmol/L (ref 135–145)
Total Bilirubin: 4.5 mg/dL — ABNORMAL HIGH (ref 0.3–1.2)
Total Protein: 7.6 g/dL (ref 6.5–8.1)

## 2020-06-08 LAB — URINALYSIS, ROUTINE W REFLEX MICROSCOPIC
Bacteria, UA: NONE SEEN
Bilirubin Urine: NEGATIVE
Glucose, UA: NEGATIVE mg/dL
Ketones, ur: 20 mg/dL — AB
Leukocytes,Ua: NEGATIVE
Nitrite: NEGATIVE
Protein, ur: NEGATIVE mg/dL
Specific Gravity, Urine: 1.012 (ref 1.005–1.030)
pH: 6 (ref 5.0–8.0)

## 2020-06-08 LAB — PROTIME-INR
INR: 1.3 — ABNORMAL HIGH (ref 0.8–1.2)
Prothrombin Time: 16.6 seconds — ABNORMAL HIGH (ref 11.4–15.2)

## 2020-06-08 LAB — GLUCOSE, CAPILLARY: Glucose-Capillary: 207 mg/dL — ABNORMAL HIGH (ref 70–99)

## 2020-06-08 LAB — LACTIC ACID, PLASMA
Lactic Acid, Venous: 2.2 mmol/L (ref 0.5–1.9)
Lactic Acid, Venous: 2 mmol/L (ref 0.5–1.9)

## 2020-06-08 LAB — HEPATITIS PANEL, ACUTE
HCV Ab: NONREACTIVE
Hep A IgM: NONREACTIVE
Hep B C IgM: NONREACTIVE
Hepatitis B Surface Ag: NONREACTIVE

## 2020-06-08 LAB — CBC
HCT: 30.4 % — ABNORMAL LOW (ref 36.0–46.0)
Hemoglobin: 10.4 g/dL — ABNORMAL LOW (ref 12.0–15.0)
MCH: 31 pg (ref 26.0–34.0)
MCHC: 34.2 g/dL (ref 30.0–36.0)
MCV: 90.7 fL (ref 80.0–100.0)
Platelets: 29 10*3/uL — CL (ref 150–400)
RBC: 3.35 MIL/uL — ABNORMAL LOW (ref 3.87–5.11)
RDW: 19 % — ABNORMAL HIGH (ref 11.5–15.5)
WBC: 4.4 10*3/uL (ref 4.0–10.5)
nRBC: 0 % (ref 0.0–0.2)

## 2020-06-08 LAB — LIPASE, BLOOD: Lipase: 19 U/L (ref 11–51)

## 2020-06-08 LAB — AMMONIA: Ammonia: 41 umol/L — ABNORMAL HIGH (ref 9–35)

## 2020-06-08 LAB — ETHANOL: Alcohol, Ethyl (B): 71 mg/dL — ABNORMAL HIGH (ref <10)

## 2020-06-08 LAB — APTT: aPTT: 30 seconds (ref 24–36)

## 2020-06-08 MED ORDER — MENTHOL 3 MG MT LOZG
1.0000 | LOZENGE | OROMUCOSAL | Status: DC | PRN
  Filled 2020-06-08: qty 9

## 2020-06-08 MED ORDER — ONDANSETRON HCL 4 MG/2ML IJ SOLN
4.0000 mg | Freq: Once | INTRAMUSCULAR | Status: AC
  Administered 2020-06-08: 4 mg via INTRAVENOUS
  Filled 2020-06-08: qty 2

## 2020-06-08 MED ORDER — PANTOPRAZOLE SODIUM 40 MG PO TBEC
40.0000 mg | DELAYED_RELEASE_TABLET | Freq: Every day | ORAL | Status: DC
  Administered 2020-06-09: 40 mg via ORAL
  Filled 2020-06-08 (×2): qty 1

## 2020-06-08 MED ORDER — ALBUTEROL SULFATE HFA 108 (90 BASE) MCG/ACT IN AERS: 1.0000 | INHALATION_SPRAY | Freq: Four times a day (QID) | RESPIRATORY_TRACT | Status: DC | PRN

## 2020-06-08 MED ORDER — THIAMINE HCL 100 MG PO TABS: 100.0000 mg | ORAL_TABLET | Freq: Every day | ORAL | Status: DC

## 2020-06-08 MED ORDER — LORAZEPAM 1 MG PO TABS
0.0000 mg | ORAL_TABLET | Freq: Four times a day (QID) | ORAL | Status: AC
  Administered 2020-06-09: 1 mg via ORAL
  Filled 2020-06-08: qty 1

## 2020-06-08 MED ORDER — ONDANSETRON HCL 4 MG PO TABS
4.0000 mg | ORAL_TABLET | Freq: Four times a day (QID) | ORAL | Status: DC | PRN
Start: 2020-06-08 — End: 2020-06-11

## 2020-06-08 MED ORDER — LORAZEPAM 2 MG/ML IJ SOLN
1.0000 mg | INTRAMUSCULAR | Status: DC | PRN
  Administered 2020-06-08: 1 mg via INTRAVENOUS
  Filled 2020-06-08: qty 1

## 2020-06-08 MED ORDER — LORAZEPAM 1 MG PO TABS: 0.0000 mg | ORAL_TABLET | Freq: Two times a day (BID) | ORAL | Status: DC

## 2020-06-08 MED ORDER — OXYCODONE HCL 5 MG PO TABS: 5.0000 mg | ORAL_TABLET | ORAL | Status: DC | PRN

## 2020-06-08 MED ORDER — ADULT MULTIVITAMIN W/MINERALS CH
1.0000 | ORAL_TABLET | Freq: Every day | ORAL | Status: DC
  Administered 2020-06-09 – 2020-06-10 (×2): 1 via ORAL
  Filled 2020-06-08 (×3): qty 1

## 2020-06-08 MED ORDER — SODIUM CHLORIDE 0.9 % IV BOLUS
1000.0000 mL | Freq: Once | INTRAVENOUS | Status: AC
  Administered 2020-06-08: 1000 mL via INTRAVENOUS

## 2020-06-08 MED ORDER — ACETAMINOPHEN 325 MG PO TABS
650.0000 mg | ORAL_TABLET | Freq: Once | ORAL | Status: AC
  Administered 2020-06-08: 650 mg via ORAL
  Filled 2020-06-08: qty 2

## 2020-06-08 MED ORDER — TRAZODONE HCL 50 MG PO TABS
50.0000 mg | ORAL_TABLET | Freq: Every day | ORAL | Status: DC
  Administered 2020-06-08 – 2020-06-10 (×3): 50 mg via ORAL
  Filled 2020-06-08 (×3): qty 1

## 2020-06-08 MED ORDER — FOLIC ACID 1 MG PO TABS
1.0000 mg | ORAL_TABLET | Freq: Every day | ORAL | Status: DC
  Administered 2020-06-09 – 2020-06-10 (×2): 1 mg via ORAL
  Filled 2020-06-08 (×3): qty 1

## 2020-06-08 MED ORDER — THIAMINE HCL 100 MG PO TABS
100.0000 mg | ORAL_TABLET | Freq: Every day | ORAL | Status: DC
  Administered 2020-06-09 – 2020-06-10 (×2): 100 mg via ORAL
  Filled 2020-06-08 (×3): qty 1

## 2020-06-08 MED ORDER — LORAZEPAM 1 MG PO TABS: 1.0000 mg | ORAL_TABLET | ORAL | Status: DC | PRN

## 2020-06-08 MED ORDER — MORPHINE SULFATE (PF) 2 MG/ML IV SOLN
2.0000 mg | Freq: Once | INTRAVENOUS | Status: AC
  Administered 2020-06-08: 2 mg via INTRAVENOUS
  Filled 2020-06-08: qty 1

## 2020-06-08 MED ORDER — IOHEXOL 300 MG/ML  SOLN
100.0000 mL | Freq: Once | INTRAMUSCULAR | Status: AC | PRN
  Administered 2020-06-08: 100 mL via INTRAVENOUS

## 2020-06-08 MED ORDER — MAGNESIUM SULFATE 50 % IJ SOLN: 2.0000 g | Freq: Once | INTRAMUSCULAR | Status: DC

## 2020-06-08 MED ORDER — CALCIUM GLUCONATE-NACL 1-0.675 GM/50ML-% IV SOLN
1.0000 g | Freq: Once | INTRAVENOUS | Status: AC
  Administered 2020-06-08: 1000 mg via INTRAVENOUS
  Filled 2020-06-08: qty 50

## 2020-06-08 MED ORDER — KCL IN DEXTROSE-NACL 40-5-0.45 MEQ/L-%-% IV SOLN
INTRAVENOUS | Status: DC
  Filled 2020-06-08 (×2): qty 1000

## 2020-06-08 MED ORDER — MAGNESIUM SULFATE 2 GM/50ML IV SOLN
2.0000 g | Freq: Once | INTRAVENOUS | Status: AC
  Administered 2020-06-08: 2 g via INTRAVENOUS
  Filled 2020-06-08: qty 50

## 2020-06-08 MED ORDER — ONDANSETRON HCL 4 MG/2ML IJ SOLN: 4.0000 mg | Freq: Four times a day (QID) | INTRAMUSCULAR | Status: DC | PRN

## 2020-06-08 MED ORDER — POTASSIUM CHLORIDE 10 MEQ/100ML IV SOLN
10.0000 meq | Freq: Once | INTRAVENOUS | Status: AC
  Administered 2020-06-08: 10 meq via INTRAVENOUS
  Filled 2020-06-08: qty 100

## 2020-06-08 MED ORDER — THIAMINE HCL 100 MG/ML IJ SOLN: 100.0000 mg | Freq: Every day | INTRAMUSCULAR | Status: DC

## 2020-06-08 NOTE — ED Provider Notes (Signed)
[facility] DEPT Provider Note   CSN: [id number] Arrival date & time: 06/08/20  0731     History Chief Complaint  Patient presents with  . Abdominal Pain    Brandy Bishop is a 60 y.o. female with PMHx HTN, chronic pancreatitis, alcohol abuse, pyelo, and GIB who presents to the ED today with complaints of gradual onset, constant, sharp, RLQ abdominal pain that began 4 days ago. Pt also complains of nausea, NBNB emesis, and diarrhea. She has been unable to keep anything down due to the amount of vomiting she has been having. Pt reports her urine is darker than normal however denies dysuria or change in frequency. She was unaware she had a fever until she came to the ED today; found to be febrile at 100.3. Pt denies any previous abdominal surgeries. Denies any recent sick contacts. No recent abx use.   The history is provided by the patient, the EMS personnel and medical records.       Past Medical History:  Diagnosis Date  . Alcoholism ([place]) 2011  . Asthma   . Chronic lower back pain   . Colitis 04/2014.   Bloody diarrhea  . Daily headache   . GIB (gastrointestinal bleeding)   . Hypertension   . Pancreatitis    Chronic pancreatitis noted on CT scan in 04/2014.  Marland Kitchen Polysubstance abuse ([place]) 2011  . Pyelonephritis 05/2012  . Seizures ([place])    last seizure was 04/2019 per patient     Patient Active Problem List   Diagnosis Date Noted  . Acute liver failure 06/08/2020  . Acute GI bleeding 09/08/2019  . Polysubstance abuse ([place]) 09/08/2019  . Gastroesophageal reflux disease with esophagitis   . Seizure ([place]) 05/30/2015  . Cough   . Subtherapeutic phenytoin level   . Acute upper GI bleed   . Hypomagnesemia 04/16/2014  . Hypokalemia 04/16/2014  . Thrombocytopenia ([place]) 04/16/2014  . Hepatic steatosis 04/15/2014  . Alcohol abuse 04/15/2014  . Chronic pancreatitis ([place]) 04/15/2014  . Protein-calorie malnutrition, severe ([place]) 04/15/2014  .  Severe protein-calorie malnutrition ([place]) 04/15/2014  . Bleeding gastrointestinal   . Abnormal CT scan, colon   . Rectal bleeding   . Abdominal pain   . Alcoholic hepatitis without ascites   . GI bleed 04/14/2014  . Viral URI with cough 05/07/2012  . Pyelonephritis 05/07/2012  . Seizure disorder ([place]) 05/07/2012  . Asthma with bronchitis 05/07/2012  . HTN (hypertension) 05/07/2012    Past Surgical History:  Procedure Laterality Date  . COLONOSCOPY WITH PROPOFOL N/A 09/09/2019   Procedure: COLONOSCOPY WITH PROPOFOL;  Surgeon: Wilford Corner, MD;  Location: [facility];  Service: Endoscopy;  Laterality: N/A;  . ESOPHAGOGASTRODUODENOSCOPY N/A 05/31/2015   Procedure: ESOPHAGOGASTRODUODENOSCOPY (EGD);  Surgeon: Irene Shipper, MD;  Location: [facility] ENDOSCOPY;  Service: Endoscopy;  Laterality: N/A;  . ESOPHAGOGASTRODUODENOSCOPY N/A 09/09/2019   Procedure: ESOPHAGOGASTRODUODENOSCOPY (EGD);  Surgeon: Wilford Corner, MD;  Location: [facility];  Service: Endoscopy;  Laterality: N/A;  . NO PAST SURGERIES    . ORIF HUMERUS FRACTURE Right 05/12/2019   Procedure: OPEN REDUCTION INTERNAL FIXATION (ORIF) DISTAL HUMERUS FRACTURE;  Surgeon: Hiram Gash, MD;  Location: [facility];  Service: Orthopedics;  Laterality: Right;     OB History   No obstetric history on file.     Family History  Problem Relation Age of Onset  . Cancer Mother   . Cancer Brother     Social History   Tobacco Use  . Smoking status:  Never Smoker  . Smokeless tobacco: Never Used  Vaping Use  . Vaping Use: Never used  Substance Use Topics  . Alcohol use: Yes    Alcohol/week: 10.0 standard drinks    Types: 10 Cans of beer per week    Comment: "2 -40 ounces a day "  . Drug use: No    Home Medications Prior to Admission medications   Medication Sig Start Date End Date Taking? Authorizing Provider  albuterol (PROVENTIL HFA;VENTOLIN HFA) 108 (90 Base) MCG/ACT inhaler Inhale 1-2 puffs into the lungs every 6 (six) hours as  needed for wheezing or shortness of breath.    [provider]  amLODipine (NORVASC) 10 MG tablet Take 10 mg by mouth daily. 08/13/19   [provider]  cetirizine (ZYRTEC) 10 MG tablet Take 10 mg by mouth daily. 12/28/19   [provider]  folic acid (FOLVITE) 1 MG tablet Take 1 tablet (1 mg total) by mouth daily. 09/10/19   Nolberto Hanlon, MD  hydrochlorothiazide (HYDRODIURIL) 25 MG tablet Take 25 mg by mouth daily. 12/28/19   [provider]  hydrocortisone cream 1 % Apply to affected area 2 times daily 11/14/19   Asencion Noble, MD  hydrOXYzine (ATARAX/VISTARIL) 25 MG tablet Take 25 mg by mouth at bedtime. 04/05/19   [provider]  omeprazole (PRILOSEC) 20 MG capsule Take 20 mg by mouth daily. 10/28/19   [provider]  omeprazole (PRILOSEC) 40 MG capsule Take 1 capsule (40 mg total) by mouth daily. Patient not taking: No sig reported 09/09/19   Nolberto Hanlon, MD  sucralfate (CARAFATE) 1 GM/10ML suspension Take 10 mLs (1 g total) by mouth 4 (four) times daily -  with meals and at bedtime. Patient not taking: No sig reported 07/30/19   Quintella Reichert, MD  thiamine 100 MG tablet Take 1 tablet (100 mg total) by mouth daily. 09/10/19   Nolberto Hanlon, MD  traZODone (DESYREL) 50 MG tablet Take 50 mg by mouth at bedtime. 12/28/19   [provider]  triamcinolone ointment (KENALOG) 0.1 % Apply 1 application topically 2 (two) times daily as needed (rash).  05/20/19   [provider]    Allergies    Penicillins  Review of Systems   Review of Systems  Constitutional: Positive for fatigue and fever. Negative for chills.  Gastrointestinal: Positive for abdominal pain, diarrhea, nausea and vomiting.  Genitourinary: Negative for dysuria and frequency.  All other systems reviewed and are negative.   Physical Exam Updated Vital Signs BP (!) 86/52 (BP Location: Right Arm)   Pulse 98   Temp 100.3 F (37.9 C) (Oral)   SpO2 100%   Physical  Exam Vitals and nursing note reviewed.  Constitutional:      Appearance: She is not ill-appearing.     Comments: Thin frail appearing female  HENT:     Head: Normocephalic and atraumatic.     Mouth/Throat:     Comments: Dry MM Eyes:     Conjunctiva/sclera: Conjunctivae normal.  Cardiovascular:     Rate and Rhythm: Normal rate and regular rhythm.     Heart sounds: Normal heart sounds.  Pulmonary:     Effort: Pulmonary effort is normal.     Breath sounds: Normal breath sounds. No wheezing, rhonchi or rales.  Abdominal:     Palpations: Abdomen is soft.     Tenderness: There is generalized abdominal tenderness and tenderness in the right lower quadrant.     Comments: Soft, diffuse abdominal TTP however worse  in the RLQ, +BS throughout, no r/g/r, neg murphy's, neg mcburney's, no CVA TTP  Musculoskeletal:     Cervical back: Neck supple.  Skin:    General: Skin is warm and dry.  Neurological:     Mental Status: She is alert.     ED Results / Procedures / Treatments   Labs (all labs ordered are listed, but only abnormal results are displayed) Labs Reviewed  COMPREHENSIVE METABOLIC PANEL - Abnormal; Notable for the following components:      Result Value   Potassium 3.2 (*)    Chloride 95 (*)    CO2 17 (*)    BUN <5 (*)    Calcium 6.4 (*)    Albumin 3.2 (*)    AST 184 (*)    ALT 54 (*)    Alkaline Phosphatase 185 (*)    Total Bilirubin 4.5 (*)    Anion gap 28 (*)    All other components within normal limits  CBC - Abnormal; Notable for the following components:   RBC 3.35 (*)    Hemoglobin 10.4 (*)    HCT 30.4 (*)    RDW 19.0 (*)    Platelets 29 (*)    All other components within normal limits  URINALYSIS, ROUTINE W REFLEX MICROSCOPIC - Abnormal; Notable for the following components:   Hgb urine dipstick SMALL (*)    Ketones, ur 20 (*)    All other components within normal limits  LACTIC ACID, PLASMA - Abnormal; Notable for the following components:   Lactic Acid,  Venous 2.2 (*)    All other components within normal limits  LACTIC ACID, PLASMA - Abnormal; Notable for the following components:   Lactic Acid, Venous 2.0 (*)    All other components within normal limits  ETHANOL - Abnormal; Notable for the following components:   Alcohol, Ethyl (B) 71 (*)    All other components within normal limits  MAGNESIUM - Abnormal; Notable for the following components:   Magnesium 0.6 (*)    All other components within normal limits  PROTIME-INR - Abnormal; Notable for the following components:   Prothrombin Time 16.6 (*)    INR 1.3 (*)    All other components within normal limits  LIPASE, BLOOD  APTT  CALCIUM, IONIZED  HEPATITIS PANEL, ACUTE    EKG None  Radiology CT Abdomen Pelvis W Contrast  Result Date: 06/08/2020 CLINICAL DATA:  Right lower quadrant abdominal pain x4 days, nausea vomiting constipation EXAM: CT ABDOMEN AND PELVIS WITH CONTRAST TECHNIQUE: Multidetector CT imaging of the abdomen and pelvis was performed using the standard protocol following bolus administration of intravenous contrast. CONTRAST:  117m OMNIPAQUE IOHEXOL 300 MG/ML  SOLN COMPARISON:  Same day abdominal ultrasound and CT February 25, 2015 FINDINGS: Lower chest: Bibasilar atelectasis. Hepatobiliary: Diffusely decreased attenuation of the hepatic parenchyma. No suspicious hepatic lesion. No biliary ductal dilation. Pancreas: Similar pancreatic atrophy with mild ductal dilation in the tail and dystrophic calcifications head/neck. No evidence of acute pancreatic inflammation. Spleen: Within normal limits. Adrenals/Urinary Tract: Bilateral adrenal glands within normal limits. No hydronephrosis. No solid enhancing renal masses. Urinary a bladder is within normal limits. Stomach/Bowel: There is mild gastric wall thickening of the antrum. No pathologically dilated loops of small bowel. Small bowel diverticula without diverticulitis. The appendix appears grossly unremarkable. Diffuse colonic  diverticulosis. There is low-density wall thickening involving the terminal ileum, cecum and ascending colon which extends to a lesser extent throughout the descending colon and into the sigmoid. Without significant  adjacent inflammatory stranding. Vascular/Lymphatic: Aortic atherosclerosis without aneurysmal dilation. No e pathologically enlarged abdominal or pelvic lymph nodes. Reproductive: Uterus and bilateral adnexa are unremarkable. Other: No significant abdominopelvic ascites. There is mild nonspecific subcutaneous stranding. Musculoskeletal: No acute or significant osseous findings. Multilevel degenerative changes spine. No acute osseous abnormality. Calcific density in the subcutaneous tissues overlying the left gluteal musculature likely sequela of prior trauma or injection. IMPRESSION: 1. Low-density wall thickening involving the terminal ileum, cecum and ascending colon extending to a lesser extent throughout the descending colon and into the sigmoid. Without significant adjacent inflammatory stranding. Findings are nonspecific and may represent an infectious or inflammatory enterocolitis or be sequela of hypoalbuminemia. 2. Mild gastric wall thickening of the antrum, which may represent gastritis or represent sequela of hypoalbuminemia. 3. Small bowel diverticula and diffuse colonic diverticulosis without evidence of diverticulitis. 4. Hepatic steatosis. 5. Aortic atherosclerosis. Aortic Atherosclerosis (ICD10-I70.0). Electronically Signed   By: Dahlia Bailiff MD   On: 06/08/2020 11:00   US Abdomen Limited RUQ (LIVER/GB)  Result Date: 06/08/2020 CLINICAL DATA:  Elevated LFTs EXAM: ULTRASOUND ABDOMEN LIMITED RIGHT UPPER QUADRANT COMPARISON:  CT abdomen pelvis 02/25/2015 FINDINGS: Gallbladder: Nondilated. There is a small amount of intraluminal sludge with no large stones identified. There is no significant wall thickening. No sonographic Murphy sign noted by sonographer. Common bile duct: Diameter:  2.7 mm Liver: Diffusely increased liver echogenicity with coarse echotexture. No focal lesion identified on this limited exam. Portal vein is patent on color Doppler imaging with normal direction of blood flow towards the liver. Other: None. IMPRESSION: Increased liver echogenicity with coarse echotexture compatible with chronic liver disease with element of steatosis. No evidence of cholecystitis. Electronically Signed   By: Maurine Simmering   On: 06/08/2020 09:32    Procedures .Critical Care Performed by: Eustaquio Maize, PA-C Authorized by: Eustaquio Maize, PA-C   Critical care provider statement:    Critical care time (minutes):  60   Critical care was time spent personally by me on the following activities:  Discussions with consultants, evaluation of patient's response to treatment, examination of patient, ordering and performing treatments and interventions, ordering and review of laboratory studies, ordering and review of radiographic studies, pulse oximetry, re-evaluation of patient's condition, obtaining history from patient or surrogate and review of old charts     Medications Ordered in ED Medications  magnesium sulfate IVPB 2 g 50 mL (2 g Intravenous New Bag/Given 06/08/20 1231)  sodium chloride 0.9 % bolus 1,000 mL (0 mLs Intravenous Stopped 06/08/20 1038)  ondansetron (ZOFRAN) injection 4 mg (4 mg Intravenous Given 06/08/20 0835)  morphine 2 MG/ML injection 2 mg (2 mg Intravenous Given 06/08/20 0837)  acetaminophen (TYLENOL) tablet 650 mg (650 mg Oral Given 06/08/20 0839)  potassium chloride 10 mEq in 100 mL IVPB (0 mEq Intravenous Stopped 06/08/20 1207)  calcium gluconate 1 g/ 50 mL sodium chloride IVPB (0 g Intravenous Stopped 06/08/20 1207)  iohexol (OMNIPAQUE) 300 MG/ML solution 100 mL (100 mLs Intravenous Contrast Given 06/08/20 0946)  sodium chloride 0.9 % bolus 1,000 mL (0 mLs Intravenous Stopped 06/08/20 1230)    ED Course  I have reviewed the triage vital signs and the nursing  notes.  Pertinent labs & imaging results that were available during my care of the patient were reviewed by me and considered in my medical decision making (see chart for details).  Clinical Course as of 06/08/20 1234  Thu Jun 08, 2020  0949 Platelets(!!): 29 [MV]  0949 Calcium(!!): 6.4 [MV]  0950 AST(!): 184 [MV]  0950 Alkaline Phosphatase(!): 185 [MV]  0950 Total Bilirubin(!): 4.5 [MV]  0950 Lactic Acid, Venous(!!): 2.2 [MV]  1144 CT Abdomen Pelvis W Contrast IMPRESSION: 1. Low-density wall thickening involving the terminal ileum, cecum and ascending colon extending to a lesser extent throughout the descending colon and into the sigmoid. Without significant adjacent inflammatory stranding. Findings are nonspecific and may represent an infectious or inflammatory enterocolitis or be sequela of hypoalbuminemia. 2. Mild gastric wall thickening of the antrum, which may represent gastritis or represent sequela of hypoalbuminemia. 3. Small bowel diverticula and diffuse colonic diverticulosis without evidence of diverticulitis. 4. Hepatic steatosis. 5. Aortic atherosclerosis.  Aortic Atherosclerosis (ICD10-I70.0). [MV]    Clinical Course User Index [MV] Eustaquio Maize, PA-C   MDM Rules/Calculators/A&P                          60 year old female who presents to the ED today with complaint of right lower quadrant abdominal pain, nausea, vomiting, diarrhea for the past 4 days.  On arrival to the ED patient is febrile at 100.3 orally.  She feels warm to the touch.  We will plan for rectal temp.  She is nontachycardic.  Nontachypneic.  Blood pressure is soft 86/52; suspect likely due to dehydration.  Will need fluids and abdominal work-up at this time.  Concern for appendicitis.  Patient also reports that her urine has been darker than normal however I suspect this is likely secondary to dehydration.  She has no CVA tenderness palpation.  She does have a history of Pyelo.  This is certainly  on my differential as well.  Provide Tylenol, fluids, antiemetics, pain medication and CT abdomen and pelvis.  Nursing staff informed me of critical lab values - calcium 6.4 and platelets of 29. Past platelets 6 months ago at 52; suspect related to alcohol intake. Will add on coags at this time and EtOH as well as ionized calcium. Will replete calcium.   CBC without leukocytosis. Hgb stable at 10.4. Again platelets 29 CMP with calcium 6.4. Bicarb 17, glucose 89, however gap of 29. Suspect due to dehydration. Chloride 95 and potassium 3.2; will replete potassium at this time. LFTs also significantly elevated including t blii at 4.5. RUQ ultrasound ordered at this time.   Hepatic Function Latest Ref Rng & Units 06/08/2020 11/14/2019 09/08/2019  Total Protein 6.5 - 8.1 g/dL 7.6 6.9 6.4(L)  Albumin 3.5 - 5.0 g/dL 3.2(L) 3.0(L) 2.9(L)  AST 15 - 41 U/L 184(H) 53(H) 61(H)  ALT 0 - 44 U/L 54(H) 19 27  Alk Phosphatase 38 - 126 U/L 185(H) 117 128(H)  Total Bilirubin 0.3 - 1.2 mg/dL 4.5(H) 0.3 0.7  Bilirubin, Direct 0.1 - 0.5 mg/dL - - -   Lipase normal at 19 EtOH elevated 71. Pt drank last night.  INR 1.3 Magnesium 0.6; will replete.  Lactic acid elevated 2.2; pt receiving fluids at this time.   RUQ ultrasound: IMPRESSION:  Increased liver echogenicity with coarse echotexture compatible with  chronic liver disease with element of steatosis.    No evidence of cholecystitis.     CT: IMPRESSION:  1. Low-density wall thickening involving the terminal ileum, cecum  and ascending colon extending to a lesser extent throughout the  descending colon and into the sigmoid. Without significant adjacent  inflammatory stranding. Findings are nonspecific and may represent  an infectious or inflammatory enterocolitis or be sequela of  hypoalbuminemia.  2. Mild gastric wall thickening of the  antrum, which may represent  gastritis or represent sequela of hypoalbuminemia.  3. Small bowel diverticula and  diffuse colonic diverticulosis  without evidence of diverticulitis.  4. Hepatic steatosis.  5. Aortic atherosclerosis.    Aortic Atherosclerosis (ICD10-I70.0).   Given significant electrolyte derangements, worsening liver functions, and new thrombocytopenia pt needs admission. Discussed case with GI Dr. Therisa Doyne who will come evaluate patient; question liver failure vs alcoholic hepatitis. Hepatitis panel added.   Dr. Sloan Leiter with [facility] to admit.   This note was prepared using Dragon voice recognition software and may include unintentional dictation errors due to the inherent limitations of voice recognition software.   Final Clinical Impression(s) / ED Diagnoses Final diagnoses:  Elevated LFTs  Thrombocytopenia (HCC)  Hypocalcemia  Hypomagnesemia  Hypokalemia  Acute liver failure without hepatic coma    Rx / DC Orders ED Discharge Orders    None       Eustaquio Maize, PA-C 06/08/20 1234    Daleen Bo, MD 06/08/20 [phone]

## 2020-06-08 NOTE — ED Provider Notes (Signed)
  Face-to-face evaluation   History: She is a known alcoholic who presents for evaluation of nausea, vomiting, right upper quadrant abdominal pain, and constipation for several days.  She states that despite not eating much she has been able to drink alcohol.  She denies vomiting, dizziness or weakness.  Physical exam: Alert, alert and cooperative.  No respiratory distress.  Abdomen soft nontender palpation.  Medical screening examination/treatment/procedure(s) were conducted as a shared visit with non-physician practitioner(s) and myself.  I personally evaluated the patient during the encounter    Daleen Bo, MD 06/08/20 [phone]

## 2020-06-08 NOTE — ED Notes (Signed)
Pt attempted to take tylenol PO- but immediately spit it up and had short episode of dry heaves/vomit after, states she has difficulty taking pills. Provider made aware

## 2020-06-08 NOTE — Consult Note (Addendum)
[facility] Gastroenterology Consult  Referring Provider: No ref. provider found Primary Care Physician:  Eston Esters, NP Primary Gastroenterologist: Althia Forts white  Reason for Consultation: Abnormal LFTs  HPI: Brandy Bishop is a 60 y.o. female states she came to the hospital because of her" stomach". Patient is a poor historian. She tells me that she has been typical of abdominal for the last 4 days associated with nausea and vomiting for the. She believes she has lost some weight but is not able to quantify. She denies noticing blood in stool or black stools but states that because she has not been eating well for the last week and she has.  Patient states she complains" brown liquor" and beer on a daily basis, but is not able to quantify the amount or duration of alcohol use.  EGD in 2017 by Dr. Henrene Pastor for melena showed esophagitis. EGD in 2021 by Dr.Schooler showed gastritis. Colonoscopy from 8/21 for hematochezia showed unsatisfactory prep, and diverticulosis, small polyp which was not removed, was recommended repeat colonoscopy in 6 months. In the ER she was found to br hypokalemic, acidotic, jaundiced(TB 4.5/AST 184/ALT 54/ALP 185), hypocalcemic,lactic acidotic,anemic and thrombocytopenic, elevated PT/INR of 16.6/1.3. Elevated blood alcohol level of 71. Ultrasound showed chronic liver disease, steatosis, CBD 2.7 mm, nondilated, patent portal vein. CT abdomen and pelvis which showed nonspecific enterocolitis, mild gastric wall thickening, possibly from hypoalbuminemia, small bowel diverticula and diffuse colonic diverticulosis hepatic steatosis  Past Medical History:  Diagnosis Date  . Alcoholism ([place]) 2011  . Asthma   . Chronic lower back pain   . Colitis 04/2014.   Bloody diarrhea  . Daily headache   . GIB (gastrointestinal bleeding)   . Hypertension   . Pancreatitis    Chronic pancreatitis noted on CT scan in 04/2014.  Marland Kitchen Polysubstance abuse ([place]) 2011  .  Pyelonephritis 05/2012  . Seizures ([place])    last seizure was 04/2019 per patient     Past Surgical History:  Procedure Laterality Date  . COLONOSCOPY WITH PROPOFOL N/A 09/09/2019   Procedure: COLONOSCOPY WITH PROPOFOL;  Surgeon: Wilford Corner, MD;  Location: [place];  Service: Endoscopy;  Laterality: N/A;  . ESOPHAGOGASTRODUODENOSCOPY N/A 05/31/2015   Procedure: ESOPHAGOGASTRODUODENOSCOPY (EGD);  Surgeon: Irene Shipper, MD;  Location: [facility] ENDOSCOPY;  Service: Endoscopy;  Laterality: N/A;  . ESOPHAGOGASTRODUODENOSCOPY N/A 09/09/2019   Procedure: ESOPHAGOGASTRODUODENOSCOPY (EGD);  Surgeon: Wilford Corner, MD;  Location: [place];  Service: Endoscopy;  Laterality: N/A;  . NO PAST SURGERIES    . ORIF HUMERUS FRACTURE Right 05/12/2019   Procedure: OPEN REDUCTION INTERNAL FIXATION (ORIF) DISTAL HUMERUS FRACTURE;  Surgeon: Hiram Gash, MD;  Location: [facility];  Service: Orthopedics;  Laterality: Right;    Prior to Admission medications   Medication Sig Start Date End Date Taking? Authorizing Provider  amLODipine (NORVASC) 10 MG tablet Take 10 mg by mouth daily. 08/13/19   [provider]  cetirizine (ZYRTEC) 10 MG tablet Take 10 mg by mouth daily. 12/28/19   [provider]  folic acid (FOLVITE) 1 MG tablet Take 1 tablet (1 mg total) by mouth daily. Patient not taking: Reported on 06/08/2020 09/10/19   Nolberto Hanlon, MD  hydrochlorothiazide (HYDRODIURIL) 25 MG tablet Take 25 mg by mouth daily. 12/28/19   [provider]  omeprazole (PRILOSEC) 20 MG capsule Take 20 mg by mouth daily. 10/28/19   [provider]  thiamine 100 MG tablet Take 1 tablet (100 mg total) by mouth daily. Patient not taking: Reported on 06/08/2020 09/10/19  Nolberto Hanlon, MD  traZODone (DESYREL) 50 MG tablet Take 50 mg by mouth at bedtime. 12/28/19   [provider]  triamcinolone ointment (KENALOG) 0.1 % Apply 1 application topically 2 (two) times daily as needed (rash).  05/20/19    [provider]    Current Facility-Administered Medications  Medication Dose Route Frequency Provider Last Rate Last Admin  . albuterol (VENTOLIN HFA) 108 (90 Base) MCG/ACT inhaler 1-2 puff  1-2 puff Inhalation Q6H PRN Barb Merino, MD      . dextrose 5 % and 0.45 % NaCl with KCl 40 mEq/L infusion   Intravenous Continuous Ghimire, Dante Gang, MD      . folic acid (FOLVITE) tablet 1 mg  1 mg Oral Daily Barb Merino, MD   1 mg at 06/08/20 1426  . LORazepam (ATIVAN) tablet 1-4 mg  1-4 mg Oral Q1H PRN Barb Merino, MD       Or  . LORazepam (ATIVAN) injection 1-4 mg  1-4 mg Intravenous Q1H PRN Barb Merino, MD      . LORazepam (ATIVAN) tablet 0-4 mg  0-4 mg Oral Q6H Barb Merino, MD       Followed by  . [START ON 06/10/2020] LORazepam (ATIVAN) tablet 0-4 mg  0-4 mg Oral Q12H Ghimire, Dante Gang, MD      . magnesium sulfate IVPB 2 g 50 mL  2 g Intravenous Once Barb Merino, MD 50 mL/hr at 06/08/20 1430 2 g at 06/08/20 1430  . multivitamin with minerals tablet 1 tablet  1 tablet Oral Daily Barb Merino, MD   1 tablet at 06/08/20 1426  . ondansetron (ZOFRAN) tablet 4 mg  4 mg Oral Q6H PRN Barb Merino, MD       Or  . ondansetron (ZOFRAN) injection 4 mg  4 mg Intravenous Q6H PRN Barb Merino, MD      . oxyCODONE (Oxy IR/ROXICODONE) immediate release tablet 5 mg  5 mg Oral Q4H PRN Barb Merino, MD      . pantoprazole (PROTONIX) EC tablet 40 mg  40 mg Oral Daily Ghimire, Dante Gang, MD      . thiamine tablet 100 mg  100 mg Oral Daily Barb Merino, MD   100 mg at 06/08/20 1426   Or  . thiamine (B-1) injection 100 mg  100 mg Intravenous Daily Barb Merino, MD      . traZODone (DESYREL) tablet 50 mg  50 mg Oral QHS Barb Merino, MD       Current Outpatient Medications  Medication Sig Dispense Refill  . amLODipine (NORVASC) 10 MG tablet Take 10 mg by mouth daily.    . cetirizine (ZYRTEC) 10 MG tablet Take 10 mg by mouth daily.    . folic acid (FOLVITE) 1 MG tablet Take 1 tablet (1  mg total) by mouth daily. (Patient not taking: Reported on 06/08/2020) 30 tablet 0  . hydrochlorothiazide (HYDRODIURIL) 25 MG tablet Take 25 mg by mouth daily.    Marland Kitchen omeprazole (PRILOSEC) 20 MG capsule Take 20 mg by mouth daily.    Marland Kitchen thiamine 100 MG tablet Take 1 tablet (100 mg total) by mouth daily. (Patient not taking: Reported on 06/08/2020) 30 tablet 0  . traZODone (DESYREL) 50 MG tablet Take 50 mg by mouth at bedtime.    . triamcinolone ointment (KENALOG) 0.1 % Apply 1 application topically 2 (two) times daily as needed (rash).       Allergies as of 06/08/2020 - Review Complete 06/08/2020  Allergen Reaction Noted  . Penicillins Swelling 03/15/2011  Family History  Problem Relation Age of Onset  . Cancer Mother   . Cancer Brother     Social History   Socioeconomic History  . Marital status: Single    Spouse name: Not on file  . Number of children: 0  . Years of education: 60  . Highest education level: Not on file  Occupational History  . Not on file  Tobacco Use  . Smoking status: Never Smoker  . Smokeless tobacco: Never Used  Vaping Use  . Vaping Use: Never used  Substance and Sexual Activity  . Alcohol use: Yes    Alcohol/week: 10.0 standard drinks    Types: 10 Cans of beer per week    Comment: "2 -40 ounces a day "  . Drug use: No  . Sexual activity: Never  Other Topics Concern  . Not on file  Social History Narrative   Lives in [place].    Disabled due to seizures.    Stay with a friend.    Single. No kids.    Social Determinants of Health   Financial Resource Strain: Not on file  Food Insecurity: Not on file  Transportation Needs: Not on file  Physical Activity: Not on file  Stress: Not on file  Social Connections: Not on file  Intimate Partner Violence: Not on file    Review of Systems: Positive for: GI: Described in detail in HPI.    Gen: anorexia, fatigue, weakness, malaise, involuntary weight loss, denies any fever, chills, rigors, night sweats,   and sleep disorder CV: Denies chest pain, angina, palpitations, syncope, orthopnea, PND, peripheral edema, and claudication. Resp: Denies dyspnea, cough, sputum, wheezing, coughing up blood. GU : Denies urinary burning, blood in urine, urinary frequency, urinary hesitancy, nocturnal urination, and urinary incontinence. MS: Denies joint pain or swelling.  Denies muscle weakness, cramps, atrophy.  Derm: Denies rash, itching, oral ulcerations, hives, unhealing ulcers.  Psych: Denies depression, anxiety, memory loss, suicidal ideation, hallucinations,  and confusion. Heme: Denies bruising, bleeding, and enlarged lymph nodes. Neuro:  Denies any headaches, dizziness, paresthesias. Endo:  Denies any problems with DM, thyroid, adrenal function.  Physical Exam: Vital signs in last 24 hours: Temp:  [99.2 F (37.3 C)-100.3 F (37.9 C)] 99.2 F (37.3 C) (05/05 0938) Pulse Rate:  [74-116] 88 (05/05 1443) Resp:  [14-18] 16 (05/05 1443) BP: (86-184)/(52-114) 137/91 (05/05 1443) SpO2:  [98 %-100 %] 98 % (05/05 1443)    General:   Pale appearing, thinly built almost cachectic Head:  Normocephalic and atraumatic. Eyes: Mild scleral icterus, mild  pallor Ears:  Normal auditory acuity. Nose:  No deformity, discharge,  or lesions. Mouth:  No deformity or lesions.  Oropharynx pink & moist. Neck:  Supple; no masses or thyromegaly. Lungs:  Clear throughout to auscultation.   No wheezes, crackles, or rhonchi. No acute distress. Heart:  Regular rate and rhythm; no murmurs, clicks, rubs,  or gallops. Extremities:  Without clubbing or edema. Neurologic:  Alert and  oriented x4;  grossly normal neurologically. Skin:  Intact without significant lesions or rashes. Psych:  Alert and cooperative. Normal mood and affect. Abdomen:  Soft, nontender and nondistended. No masses, hepatosplenomegaly or hernias noted. Normal bowel sounds, without guarding, and without rebound.       Lab Results: Recent Labs     06/08/20 0815  WBC 4.4  HGB 10.4*  HCT 30.4*  PLT 29*   BMET Recent Labs    06/08/20 0815  NA 140  K 3.2*  CL 95*  CO2 17*  GLUCOSE 89  BUN <5*  CREATININE 0.80  CALCIUM 6.4*   LFT Recent Labs    06/08/20 0815  PROT 7.6  ALBUMIN 3.2*  AST 184*  ALT 54*  ALKPHOS 185*  BILITOT 4.5*   PT/INR Recent Labs    06/08/20 0942  LABPROT 16.6*  INR 1.3*    Studies/Results: CT Abdomen Pelvis W Contrast  Result Date: 06/08/2020 CLINICAL DATA:  Right lower quadrant abdominal pain x4 days, nausea vomiting constipation EXAM: CT ABDOMEN AND PELVIS WITH CONTRAST TECHNIQUE: Multidetector CT imaging of the abdomen and pelvis was performed using the standard protocol following bolus administration of intravenous contrast. CONTRAST:  124mL OMNIPAQUE IOHEXOL 300 MG/ML  SOLN COMPARISON:  Same day abdominal ultrasound and CT February 25, 2015 FINDINGS: Lower chest: Bibasilar atelectasis. Hepatobiliary: Diffusely decreased attenuation of the hepatic parenchyma. No suspicious hepatic lesion. No biliary ductal dilation. Pancreas: Similar pancreatic atrophy with mild ductal dilation in the tail and dystrophic calcifications head/neck. No evidence of acute pancreatic inflammation. Spleen: Within normal limits. Adrenals/Urinary Tract: Bilateral adrenal glands within normal limits. No hydronephrosis. No solid enhancing renal masses. Urinary a bladder is within normal limits. Stomach/Bowel: There is mild gastric wall thickening of the antrum. No pathologically dilated loops of small bowel. Small bowel diverticula without diverticulitis. The appendix appears grossly unremarkable. Diffuse colonic diverticulosis. There is low-density wall thickening involving the terminal ileum, cecum and ascending colon which extends to a lesser extent throughout the descending colon and into the sigmoid. Without significant adjacent inflammatory stranding. Vascular/Lymphatic: Aortic atherosclerosis without aneurysmal dilation.  No e pathologically enlarged abdominal or pelvic lymph nodes. Reproductive: Uterus and bilateral adnexa are unremarkable. Other: No significant abdominopelvic ascites. There is mild nonspecific subcutaneous stranding. Musculoskeletal: No acute or significant osseous findings. Multilevel degenerative changes spine. No acute osseous abnormality. Calcific density in the subcutaneous tissues overlying the left gluteal musculature likely sequela of prior trauma or injection. IMPRESSION: 1. Low-density wall thickening involving the terminal ileum, cecum and ascending colon extending to a lesser extent throughout the descending colon and into the sigmoid. Without significant adjacent inflammatory stranding. Findings are nonspecific and may represent an infectious or inflammatory enterocolitis or be sequela of hypoalbuminemia. 2. Mild gastric wall thickening of the antrum, which may represent gastritis or represent sequela of hypoalbuminemia. 3. Small bowel diverticula and diffuse colonic diverticulosis without evidence of diverticulitis. 4. Hepatic steatosis. 5. Aortic atherosclerosis. Aortic Atherosclerosis (ICD10-I70.0). Electronically Signed   By: Dahlia Bailiff MD   On: 06/08/2020 11:00   US Abdomen Limited RUQ (LIVER/GB)  Result Date: 06/08/2020 CLINICAL DATA:  Elevated LFTs EXAM: ULTRASOUND ABDOMEN LIMITED RIGHT UPPER QUADRANT COMPARISON:  CT abdomen pelvis 02/25/2015 FINDINGS: Gallbladder: Nondilated. There is a small amount of intraluminal sludge with no large stones identified. There is no significant wall thickening. No sonographic Murphy sign noted by sonographer. Common bile duct: Diameter: 2.7 mm Liver: Diffusely increased liver echogenicity with coarse echotexture. No focal lesion identified on this limited exam. Portal vein is patent on color Doppler imaging with normal direction of blood flow towards the liver. Other: None. IMPRESSION: Increased liver echogenicity with coarse echotexture compatible with  chronic liver disease with element of steatosis. No evidence of cholecystitis. Electronically Signed   By: Maurine Simmering   On: 06/08/2020 09:32    Impression: Abnormal liver enzymes likely reflective of acute alcoholic hepatitis, without encephalopathy or significant coagulopathy. Hepatic discriminant function is 26.  Findings of gastritis, enterocolitis likely nonspecific given hypoalbuminemia, serum albumin 3.2  Multiple electrolyte abnormalities  Severe protein calorie malnutrition  Plan: Recommend supportive management, IV fluids, electrolyte replacement, folic acid, multivitamin, thiamine. Not a candidate for prednisolone use. Recommend monitoring for alcohol withdrawal. Antiemetics as needed, full liquid diet with plans to advance as tolerated.   LOS: 0 days   Ronnette Juniper, MD  06/08/2020, 2:47 PM

## 2020-06-08 NOTE — ED Notes (Signed)
ED TO INPATIENT HANDOFF REPORT  Name/Age/Gender Brandy Bishop 60 y.o. female  Code Status    Code Status Orders  (From admission, onward)         Start     Ordered   06/08/20 1254  Full code  Continuous        06/08/20 1253        Code Status History    Date Active Date Inactive Code Status Order ID Comments User Context   09/08/2019 1419 09/09/2019 2048 Full Code [id number]  Jonah Blue, MD ED   05/30/2015 0204 06/01/2015 1551 Full Code [id number]  Lorretta Harp, MD ED   04/14/2014 1804 04/16/2014 1745 Full Code [id number]  Dow Adolph, MD ED   05/07/2012 0217 05/11/2012 1504 Full Code [id number]  Houston Siren, MD Inpatient   Advance Care Planning Activity      Home/SNF/Other Home  Chief Complaint Acute liver failure [K72.00]  Level of Care/Admitting Diagnosis ED Disposition    ED Disposition Condition Comment   Admit  Hospital Area: [facility] [[id number]]  Level of Care: Progressive [102]  Admit to Progressive based on following criteria: GI, ENDOCRINE disease patients with GI bleeding, acute liver failure or pancreatitis, stable with diabetic ketoacidosis or thyrotoxicosis (hypothyroid) state.  May admit patient to [facility] or [facility] if equivalent level of care is available:: Yes  Covid Evaluation: Asymptomatic Screening Protocol (No Symptoms)  Diagnosis: Acute liver failure [711657]  Admitting Physician: Dorcas Carrow [[id number]]  Attending Physician: Dorcas Carrow [[id number]]  Estimated length of stay: past midnight tomorrow  Certification:: I certify this patient will need inpatient services for at least 2 midnights       Medical History Past Medical History:  Diagnosis Date  . Alcoholism (HCC) 2011  . Asthma   . Chronic lower back pain   . Colitis 04/2014.   Bloody diarrhea  . Daily headache   . GIB (gastrointestinal bleeding)   . Hypertension   . Pancreatitis    Chronic pancreatitis noted on CT scan in 04/2014.  Marland Kitchen Polysubstance  abuse (HCC) 2011  . Pyelonephritis 05/2012  . Seizures (HCC)    last seizure was 04/2019 per patient     Allergies Allergies  Allergen Reactions  . Penicillins Swelling    .Has patient had a PCN reaction causing immediate rash, facial/tongue/throat swelling, SOB or lightheadedness with hypotension: Yes Has patient had a PCN reaction causing severe rash involving mucus membranes or skin necrosis: No Has patient had a PCN reaction that required hospitalization No Has patient had a PCN reaction occurring within the last 10 years: No If all of the above answers are "NO", then may proceed with Cephalosporin use.     IV Location/Drains/Wounds Patient Lines/Drains/Airways Status    Active Line/Drains/Airways    Name Placement date Placement time Site Days   Peripheral IV 06/08/20 Left Forearm 06/08/20  0813  Forearm  less than 1          Labs/Imaging Results for orders placed or performed during the hospital encounter of 06/08/20 (from the past 48 hour(s))  Lipase, blood     Status: None   Collection Time: 06/08/20  8:15 AM  Result Value Ref Range   Lipase 19 11 - 51 U/L    Comment: Performed at [facility], [address], [place], [place] [address]  Comprehensive metabolic panel     Status: Abnormal   Collection Time: 06/08/20  8:15 AM  Result Value Ref Range  Sodium 140 135 - 145 mmol/L    Comment: REPEATED TO VERIFY   Potassium 3.2 (L) 3.5 - 5.1 mmol/L   Chloride 95 (L) 98 - 111 mmol/L    Comment: REPEATED TO VERIFY   CO2 17 (L) 22 - 32 mmol/L    Comment: REPEATED TO VERIFY   Glucose, Bld 89 70 - 99 mg/dL    Comment: Glucose reference range applies only to samples taken after fasting for at least 8 hours.   BUN <5 (L) 6 - 20 mg/dL   Creatinine, Ser 0.80 0.44 - 1.00 mg/dL   Calcium 6.4 (LL) 8.9 - 10.3 mg/dL    Comment: CRITICAL RESULT CALLED TO, READ BACK BY AND VERIFIED WITH: BANNO,A. RN AT 6283 06/08/20 MULLINS,T    Total Protein 7.6 6.5 - 8.1  g/dL   Albumin 3.2 (L) 3.5 - 5.0 g/dL   AST 184 (H) 15 - 41 U/L   ALT 54 (H) 0 - 44 U/L   Alkaline Phosphatase 185 (H) 38 - 126 U/L   Total Bilirubin 4.5 (H) 0.3 - 1.2 mg/dL   GFR, Estimated >60 >60 mL/min    Comment: (NOTE) Calculated using the CKD-EPI Creatinine Equation (2021)    Anion gap 28 (H) 5 - 15    Comment: Performed at [facility], [place] [address], [place], [place] [address]  CBC     Status: Abnormal   Collection Time: 06/08/20  8:15 AM  Result Value Ref Range   WBC 4.4 4.0 - 10.5 K/uL   RBC 3.35 (L) 3.87 - 5.11 MIL/uL   Hemoglobin 10.4 (L) 12.0 - 15.0 g/dL   HCT 30.4 (L) 36.0 - 46.0 %   MCV 90.7 80.0 - 100.0 fL   MCH 31.0 26.0 - 34.0 pg   MCHC 34.2 30.0 - 36.0 g/dL   RDW 19.0 (H) 11.5 - 15.5 %   Platelets 29 (LL) 150 - 400 K/uL    Comment: SPECIMEN CHECKED FOR CLOTS Immature Platelet Fraction may be clinically indicated, consider ordering this additional test [id number] PLATELET COUNT CONFIRMED BY SMEAR THIS CRITICAL RESULT HAS VERIFIED AND BEEN CALLED TO BANNO A. BY JOLANDE MECIAL ON 05 05 2022 AT 0854, AND HAS BEEN READ BACK. RESULTS VERIFIED    nRBC 0.0 0.0 - 0.2 %    Comment: Performed at [facility], [place] [address] [place], [place] [address]  Lactic acid, plasma     Status: Abnormal   Collection Time: 06/08/20  8:47 AM  Result Value Ref Range   Lactic Acid, Venous 2.2 (HH) 0.5 - 1.9 mmol/L    Comment: CRITICAL RESULT CALLED TO, READ BACK BY AND VERIFIED WITHArnoldo Lenis RN AT [phone] 06/08/20 MULLINS,T Performed at [facility], [place] [address], [place], [place] [address]   Ethanol     Status: Abnormal   Collection Time: 06/08/20  9:42 AM  Result Value Ref Range   Alcohol, Ethyl (B) 71 (H) <10 mg/dL    Comment: (NOTE) Lowest detectable limit for serum alcohol is 10 mg/dL.  For medical purposes only. Performed at [facility], [place] [address] [place], [place] [address]    Magnesium     Status: Abnormal   Collection Time: 06/08/20  9:42 AM  Result Value Ref Range   Magnesium 0.6 (LL) 1.7 - 2.4 mg/dL    Comment: CRITICAL RESULT CALLED TO, READ BACK BY AND VERIFIED WITHArnoldo Lenis RN AT 1829 06/08/20 MULLINS,T Performed at [facility], [place]  [address], [place], [place] [address]   Protime-INR     Status: Abnormal   Collection Time: 06/08/20  9:42 AM  Result Value Ref Range   Prothrombin Time 16.6 (H) 11.4 - 15.2 seconds   INR 1.3 (H) 0.8 - 1.2    Comment: (NOTE) INR goal varies based on device and disease states. Performed at [facility], [place] [address] [place], [place] [address]   APTT     Status: None   Collection Time: 06/08/20  9:42 AM  Result Value Ref Range   aPTT 30 24 - 36 seconds    Comment: Performed at [facility], [place] [address], [place], [place] [address]  Urinalysis, Routine w reflex microscopic Urine, Clean Catch     Status: Abnormal   Collection Time: 06/08/20 10:23 AM  Result Value Ref Range   Color, Urine YELLOW YELLOW   APPearance CLEAR CLEAR   Specific Gravity, Urine 1.012 1.005 - 1.030   pH 6.0 5.0 - 8.0   Glucose, UA NEGATIVE NEGATIVE mg/dL   Hgb urine dipstick SMALL (A) NEGATIVE   Bilirubin Urine NEGATIVE NEGATIVE   Ketones, ur 20 (A) NEGATIVE mg/dL   Protein, ur NEGATIVE NEGATIVE mg/dL   Nitrite NEGATIVE NEGATIVE   Leukocytes,Ua NEGATIVE NEGATIVE   WBC, UA 0-5 0 - 5 WBC/hpf   Bacteria, UA NONE SEEN NONE SEEN   Hyaline Casts, UA PRESENT     Comment: Performed at [facility], [place] [address], [place], [place] [address]  Lactic acid, plasma     Status: Abnormal   Collection Time: 06/08/20 10:55 AM  Result Value Ref Range   Lactic Acid, Venous 2.0 (HH) 0.5 - 1.9 mmol/L    Comment: CRITICAL VALUE NOTED.  VALUE IS CONSISTENT WITH PREVIOUSLY REPORTED AND CALLED VALUE. Performed at [facility], [place] [address] [place],  [place] [address]   Hepatitis panel, acute     Status: None   Collection Time: 06/08/20 12:30 PM  Result Value Ref Range   Hepatitis B Surface Ag NON REACTIVE NON REACTIVE   HCV Ab NON REACTIVE NON REACTIVE    Comment: (NOTE) Nonreactive HCV antibody screen is consistent with no HCV infections,  unless recent infection is suspected or other evidence exists to indicate HCV infection.     Hep A IgM NON REACTIVE NON REACTIVE   Hep B C IgM NON REACTIVE NON REACTIVE    Comment: Performed at [facility], [place] [address] [place], [place] [address]  Ammonia     Status: Abnormal   Collection Time: 06/08/20  2:30 PM  Result Value Ref Range   Ammonia 41 (H) 9 - 35 umol/L    Comment: Performed at [facility], [place] [address] [place], [place] [address]   CT Abdomen Pelvis W Contrast  Result Date: 06/08/2020 CLINICAL DATA:  Right lower quadrant abdominal pain x4 days, nausea vomiting constipation EXAM: CT ABDOMEN AND PELVIS WITH CONTRAST TECHNIQUE: Multidetector CT imaging of the abdomen and pelvis was performed using the standard protocol following bolus administration of intravenous contrast. CONTRAST:  172mL OMNIPAQUE IOHEXOL 300 MG/ML  SOLN COMPARISON:  Same day abdominal ultrasound and CT February 25, 2015 FINDINGS: Lower chest: Bibasilar atelectasis. Hepatobiliary: Diffusely decreased attenuation of the hepatic parenchyma. No suspicious hepatic lesion. No biliary ductal dilation. Pancreas: Similar pancreatic atrophy with mild ductal dilation in the tail and dystrophic calcifications head/neck. No evidence of acute pancreatic inflammation. Spleen: Within normal limits. Adrenals/Urinary Tract: Bilateral  adrenal glands within normal limits. No hydronephrosis. No solid enhancing renal masses. Urinary a bladder is within normal limits. Stomach/Bowel: There is mild gastric wall thickening of the antrum. No pathologically dilated loops of small bowel. Small bowel diverticula without  diverticulitis. The appendix appears grossly unremarkable. Diffuse colonic diverticulosis. There is low-density wall thickening involving the terminal ileum, cecum and ascending colon which extends to a lesser extent throughout the descending colon and into the sigmoid. Without significant adjacent inflammatory stranding. Vascular/Lymphatic: Aortic atherosclerosis without aneurysmal dilation. No e pathologically enlarged abdominal or pelvic lymph nodes. Reproductive: Uterus and bilateral adnexa are unremarkable. Other: No significant abdominopelvic ascites. There is mild nonspecific subcutaneous stranding. Musculoskeletal: No acute or significant osseous findings. Multilevel degenerative changes spine. No acute osseous abnormality. Calcific density in the subcutaneous tissues overlying the left gluteal musculature likely sequela of prior trauma or injection. IMPRESSION: 1. Low-density wall thickening involving the terminal ileum, cecum and ascending colon extending to a lesser extent throughout the descending colon and into the sigmoid. Without significant adjacent inflammatory stranding. Findings are nonspecific and may represent an infectious or inflammatory enterocolitis or be sequela of hypoalbuminemia. 2. Mild gastric wall thickening of the antrum, which may represent gastritis or represent sequela of hypoalbuminemia. 3. Small bowel diverticula and diffuse colonic diverticulosis without evidence of diverticulitis. 4. Hepatic steatosis. 5. Aortic atherosclerosis. Aortic Atherosclerosis (ICD10-I70.0). Electronically Signed   By: Dahlia Bailiff MD   On: 06/08/2020 11:00   US Abdomen Limited RUQ (LIVER/GB)  Result Date: 06/08/2020 CLINICAL DATA:  Elevated LFTs EXAM: ULTRASOUND ABDOMEN LIMITED RIGHT UPPER QUADRANT COMPARISON:  CT abdomen pelvis 02/25/2015 FINDINGS: Gallbladder: Nondilated. There is a small amount of intraluminal sludge with no large stones identified. There is no significant wall thickening. No  sonographic Murphy sign noted by sonographer. Common bile duct: Diameter: 2.7 mm Liver: Diffusely increased liver echogenicity with coarse echotexture. No focal lesion identified on this limited exam. Portal vein is patent on color Doppler imaging with normal direction of blood flow towards the liver. Other: None. IMPRESSION: Increased liver echogenicity with coarse echotexture compatible with chronic liver disease with element of steatosis. No evidence of cholecystitis. Electronically Signed   By: Maurine Simmering   On: 06/08/2020 09:32    Pending Labs Unresulted Labs (From admission, onward)          Start     Ordered   06/09/20 0500  Comprehensive metabolic panel  Daily,   R      06/08/20 1253   06/09/20 0500  CBC  Daily,   R      06/08/20 1253   06/09/20 0500  Protime-INR  Tomorrow morning,   R        06/08/20 1253   06/09/20 0500  Magnesium  Daily,   R      06/08/20 1253   06/09/20 0500  Phosphorus  Daily,   R      06/08/20 1253   06/08/20 1254  Ammonia  Daily,   R      06/08/20 1253   06/08/20 0902  Calcium, ionized  Once,   STAT        06/08/20 0905          Vitals/Pain Today's Vitals   06/08/20 2100 06/08/20 2130 06/08/20 2149 06/08/20 2202  BP: (!) 146/97 (!) 149/87  (!) 136/94  Pulse: 97 (!) 111 97 (!) 102  Resp:    12  Temp:      TempSrc:      SpO2: 99% 100%  98% 100%  PainSc:        Isolation Precautions No active isolations  Medications Medications  traZODone (DESYREL) tablet 50 mg (has no administration in time range)  pantoprazole (PROTONIX) EC tablet 40 mg (40 mg Oral Patient Refused/Not Given 7/0/01 7494)  folic acid (FOLVITE) tablet 1 mg (1 mg Oral Patient Refused/Not Given 06/08/20 1426)  albuterol (VENTOLIN HFA) 108 (90 Base) MCG/ACT inhaler 1-2 puff (has no administration in time range)  dextrose 5 % and 0.45 % NaCl with KCl 40 mEq/L infusion ( Intravenous New Bag/Given 06/08/20 1523)  oxyCODONE (Oxy IR/ROXICODONE) immediate release tablet 5 mg (has no  administration in time range)  ondansetron (ZOFRAN) tablet 4 mg (has no administration in time range)    Or  ondansetron (ZOFRAN) injection 4 mg (has no administration in time range)  LORazepam (ATIVAN) tablet 1-4 mg ( Oral See Alternative 06/08/20 1534)    Or  LORazepam (ATIVAN) injection 1-4 mg (1 mg Intravenous Given 06/08/20 1534)  thiamine tablet 100 mg (100 mg Oral Patient Refused/Not Given 06/08/20 1426)    Or  thiamine (B-1) injection 100 mg ( Intravenous See Alternative 06/08/20 1426)  multivitamin with minerals tablet 1 tablet (1 tablet Oral Patient Refused/Not Given 06/08/20 1426)  LORazepam (ATIVAN) tablet 0-4 mg (0 mg Oral Not Given 06/08/20 1857)    Followed by  LORazepam (ATIVAN) tablet 0-4 mg (has no administration in time range)  sodium chloride 0.9 % bolus 1,000 mL (0 mLs Intravenous Stopped 06/08/20 1038)  ondansetron (ZOFRAN) injection 4 mg (4 mg Intravenous Given 06/08/20 0835)  morphine 2 MG/ML injection 2 mg (2 mg Intravenous Given 06/08/20 0837)  acetaminophen (TYLENOL) tablet 650 mg (650 mg Oral Given 06/08/20 0839)  potassium chloride 10 mEq in 100 mL IVPB (0 mEq Intravenous Stopped 06/08/20 1207)  calcium gluconate 1 g/ 50 mL sodium chloride IVPB (0 g Intravenous Stopped 06/08/20 1207)  iohexol (OMNIPAQUE) 300 MG/ML solution 100 mL (100 mLs Intravenous Contrast Given 06/08/20 0946)  sodium chloride 0.9 % bolus 1,000 mL (0 mLs Intravenous Stopped 06/08/20 1230)  magnesium sulfate IVPB 2 g 50 mL (0 g Intravenous Stopped 06/08/20 1426)  magnesium sulfate IVPB 2 g 50 mL (0 g Intravenous Stopped 06/08/20 1535)    Mobility walks with person assist

## 2020-06-08 NOTE — ED Triage Notes (Signed)
Per EMS, pt complains of nv constipation, right lower abd pain x 4 days. No abdominal/surgical hx.   BP 112/84 HR 100 SpO2 100

## 2020-06-08 NOTE — ED Notes (Signed)
Patient transported to CT 

## 2020-06-08 NOTE — H&P (Signed)
History and Physical    KELSAY HAGGARD ERX:[id number] DOB: 1960/02/18 DOA: 06/08/2020  PCP: Eston Esters, NP  Patient coming from: Home  I have personally briefly reviewed patient's old medical records available.   Chief Complaint: Abdominal pain  HPI: VEERA STAPLETON is a 60 y.o. female with medical history significant of alcoholism with ongoing alcohol use, history of GI bleeding, chronic alcoholic pancreatitis, history of seizure disorder related to polysubstance use and alcohol withdrawal, chronic liver disease and chronic thrombocytopenia presents to the emergency room with ongoing abdominal pain for last 4 days.  According to the patient, she started having diffuse abdominal pain , more so towards the right lower quadrant and right upper quadrant, moderate in intensity, 6/10 at times, no relation to food intake or alcohol intake, without any associated nausea vomiting or diarrhea.  She continues to stay home.  She was still drinking until last night.  Today morning , she could not tolerate the pain so came to the ER. Patient told me she is not vomiting, however on initial presentation to ER she told that she was vomiting and unable to eat anything.  ED Course: Initial blood pressure 86/52 but ultimately stabilized.  Hemoglobin fairly stable.  Patient on room air.  Low-grade fever.  Potassium 3.2, magnesium 0.6.  Calcium 6.4, corrected calcium more than 7.  Platelets 29, recently about 50-70.  PT/INR is fairly normal.  Bilirubin 5.5 with mildly elevated transaminases.  Lipase is 19.  CT scan of the abdomen pelvis shows diffuse enteritis.  Negative for cholecystitis.  Mental status is normal. Aggressive electrolyte replacement started, symptomatic treatment started for nausea and belly pain, GI also consulted and admission requested for hospitalization. Patient tells me that she continues to drink.  She wants to quit.  She was surprised to know that her liver is all  damaged.  Review of Systems: all systems are reviewed and pertinent positive as per HPI otherwise rest are negative.    Past Medical History:  Diagnosis Date  . Alcoholism ([place]) 2011  . Asthma   . Chronic lower back pain   . Colitis 04/2014.   Bloody diarrhea  . Daily headache   . GIB (gastrointestinal bleeding)   . Hypertension   . Pancreatitis    Chronic pancreatitis noted on CT scan in 04/2014.  Marland Kitchen Polysubstance abuse ([place]) 2011  . Pyelonephritis 05/2012  . Seizures ([place])    last seizure was 04/2019 per patient     Past Surgical History:  Procedure Laterality Date  . COLONOSCOPY WITH PROPOFOL N/A 09/09/2019   Procedure: COLONOSCOPY WITH PROPOFOL;  Surgeon: Wilford Corner, MD;  Location: [facility];  Service: Endoscopy;  Laterality: N/A;  . ESOPHAGOGASTRODUODENOSCOPY N/A 05/31/2015   Procedure: ESOPHAGOGASTRODUODENOSCOPY (EGD);  Surgeon: Irene Shipper, MD;  Location: [facility] ENDOSCOPY;  Service: Endoscopy;  Laterality: N/A;  . ESOPHAGOGASTRODUODENOSCOPY N/A 09/09/2019   Procedure: ESOPHAGOGASTRODUODENOSCOPY (EGD);  Surgeon: Wilford Corner, MD;  Location: [facility];  Service: Endoscopy;  Laterality: N/A;  . NO PAST SURGERIES    . ORIF HUMERUS FRACTURE Right 05/12/2019   Procedure: OPEN REDUCTION INTERNAL FIXATION (ORIF) DISTAL HUMERUS FRACTURE;  Surgeon: Hiram Gash, MD;  Location: [facility];  Service: Orthopedics;  Laterality: Right;    Social history   reports that she has never smoked. She has never used smokeless tobacco. She reports current alcohol use of about 10.0 standard drinks of alcohol per week. She reports that she does not use drugs.  Allergies  Allergen Reactions  . Penicillins  Swelling    .Has patient had a PCN reaction causing immediate rash, facial/tongue/throat swelling, SOB or lightheadedness with hypotension: Yes Has patient had a PCN reaction causing severe rash involving mucus membranes or skin necrosis: No Has patient had a PCN reaction that required  hospitalization No Has patient had a PCN reaction occurring within the last 10 years: No If all of the above answers are "NO", then may proceed with Cephalosporin use.     Family History  Problem Relation Age of Onset  . Cancer Mother   . Cancer Brother      Prior to Admission medications   Medication Sig Start Date End Date Taking? Authorizing Provider  albuterol (PROVENTIL HFA;VENTOLIN HFA) 108 (90 Base) MCG/ACT inhaler Inhale 1-2 puffs into the lungs every 6 (six) hours as needed for wheezing or shortness of breath.    [provider]  amLODipine (NORVASC) 10 MG tablet Take 10 mg by mouth daily. 08/13/19   [provider]  cetirizine (ZYRTEC) 10 MG tablet Take 10 mg by mouth daily. 12/28/19   [provider]  folic acid (FOLVITE) 1 MG tablet Take 1 tablet (1 mg total) by mouth daily. 09/10/19   Nolberto Hanlon, MD  hydrochlorothiazide (HYDRODIURIL) 25 MG tablet Take 25 mg by mouth daily. 12/28/19   [provider]  hydrocortisone cream 1 % Apply to affected area 2 times daily 11/14/19   Asencion Noble, MD  hydrOXYzine (ATARAX/VISTARIL) 25 MG tablet Take 25 mg by mouth at bedtime. 04/05/19   [provider]  omeprazole (PRILOSEC) 20 MG capsule Take 20 mg by mouth daily. 10/28/19   [provider]  sucralfate (CARAFATE) 1 GM/10ML suspension Take 10 mLs (1 g total) by mouth 4 (four) times daily -  with meals and at bedtime. Patient not taking: No sig reported 07/30/19   Quintella Reichert, MD  thiamine 100 MG tablet Take 1 tablet (100 mg total) by mouth daily. 09/10/19   Nolberto Hanlon, MD  traZODone (DESYREL) 50 MG tablet Take 50 mg by mouth at bedtime. 12/28/19   [provider]  triamcinolone ointment (KENALOG) 0.1 % Apply 1 application topically 2 (two) times daily as needed (rash).  05/20/19   [provider]    Physical Exam: Vitals:   06/08/20 1020 06/08/20 1105 06/08/20 1115 06/08/20 1159  BP: (!) 161/107 (!) 161/114  (!)  184/100  Pulse: 85 (!) 114 79 74  Resp: 16 17  14   Temp:      TempSrc:      SpO2: 100% 100% 100% 100%    Constitutional: Mostly on room air.  Frail and debilitated.  Chronically sick looking and cachectic lady lying in bed on room air. Vitals:   06/08/20 1020 06/08/20 1105 06/08/20 1115 06/08/20 1159  BP: (!) 161/107 (!) 161/114  (!) 184/100  Pulse: 85 (!) 114 79 74  Resp: 16 17  14   Temp:      TempSrc:      SpO2: 100% 100% 100% 100%   Eyes: PERRL, lids and conjunctivae icteric. ENMT: Mucous membranes are dry.  Posterior pharynx clear of any exudate or lesions.Normal dentition.  Neck: normal, supple, no masses, no thyromegaly Respiratory: clear to auscultation bilaterally, no wheezing, no crackles. Normal respiratory effort. No accessory muscle use.  Cardiovascular: Regular rate and rhythm, patient has flow murmur.. No extremity edema. 2+ pedal pulses. No carotid bruits.  Abdomen: Diffusely distended but nontender.  Soft.  No localized rigidity or guarding.  Murphy sign negative.  Bowel  sounds present. Musculoskeletal: no clubbing / cyanosis. No joint deformity upper and lower extremities. Good ROM, no contractures. Normal muscle tone.  Skin: no rashes, lesions, ulcers. No induration Neurologic: CN 2-12 grossly intact. Sensation intact, DTR normal. Strength 5/5 in all 4.  Generalized weakness. Psychiatric: Normal judgment and insight. Alert and oriented x 3.  Flat affect.  Anxious mood.    Labs on Admission: I have personally reviewed following labs and imaging studies  CBC: Recent Labs  Lab 06/08/20 0815  WBC 4.4  HGB 10.4*  HCT 30.4*  MCV 90.7  PLT 29*   Basic Metabolic Panel: Recent Labs  Lab 06/08/20 0815 06/08/20 0942  NA 140  --   K 3.2*  --   CL 95*  --   CO2 17*  --   GLUCOSE 89  --   BUN <5*  --   CREATININE 0.80  --   CALCIUM 6.4*  --   MG  --  0.6*   GFR: CrCl cannot be calculated (Unknown ideal weight.). Liver Function Tests: Recent Labs  Lab  06/08/20 0815  AST 184*  ALT 54*  ALKPHOS 185*  BILITOT 4.5*  PROT 7.6  ALBUMIN 3.2*   Recent Labs  Lab 06/08/20 0815  LIPASE 19   No results for input(s): AMMONIA in the last 168 hours. Coagulation Profile: Recent Labs  Lab 06/08/20 0942  INR 1.3*   Cardiac Enzymes: No results for input(s): CKTOTAL, CKMB, CKMBINDEX, TROPONINI in the last 168 hours. BNP (last 3 results) No results for input(s): PROBNP in the last 8760 hours. HbA1C: No results for input(s): HGBA1C in the last 72 hours. CBG: No results for input(s): GLUCAP in the last 168 hours. Lipid Profile: No results for input(s): CHOL, HDL, LDLCALC, TRIG, CHOLHDL, LDLDIRECT in the last 72 hours. Thyroid Function Tests: No results for input(s): TSH, T4TOTAL, FREET4, T3FREE, THYROIDAB in the last 72 hours. Anemia Panel: No results for input(s): VITAMINB12, FOLATE, FERRITIN, TIBC, IRON, RETICCTPCT in the last 72 hours. Urine analysis:    Component Value Date/Time   COLORURINE YELLOW 06/08/2020 1023   APPEARANCEUR CLEAR 06/08/2020 1023   LABSPEC 1.012 06/08/2020 1023   PHURINE 6.0 06/08/2020 1023   GLUCOSEU NEGATIVE 06/08/2020 1023   HGBUR SMALL (A) 06/08/2020 1023   BILIRUBINUR NEGATIVE 06/08/2020 1023   KETONESUR 20 (A) 06/08/2020 1023   PROTEINUR NEGATIVE 06/08/2020 1023   UROBILINOGEN 0.2 04/14/2014 1419   NITRITE NEGATIVE 06/08/2020 [place] 06/08/2020 1023    Radiological Exams on Admission: CT Abdomen Pelvis W Contrast  Result Date: 06/08/2020 CLINICAL DATA:  Right lower quadrant abdominal pain x4 days, nausea vomiting constipation EXAM: CT ABDOMEN AND PELVIS WITH CONTRAST TECHNIQUE: Multidetector CT imaging of the abdomen and pelvis was performed using the standard protocol following bolus administration of intravenous contrast. CONTRAST:  128mL OMNIPAQUE IOHEXOL 300 MG/ML  SOLN COMPARISON:  Same day abdominal ultrasound and CT February 25, 2015 FINDINGS: Lower chest: Bibasilar  atelectasis. Hepatobiliary: Diffusely decreased attenuation of the hepatic parenchyma. No suspicious hepatic lesion. No biliary ductal dilation. Pancreas: Similar pancreatic atrophy with mild ductal dilation in the tail and dystrophic calcifications head/neck. No evidence of acute pancreatic inflammation. Spleen: Within normal limits. Adrenals/Urinary Tract: Bilateral adrenal glands within normal limits. No hydronephrosis. No solid enhancing renal masses. Urinary a bladder is within normal limits. Stomach/Bowel: There is mild gastric wall thickening of the antrum. No pathologically dilated loops of small bowel. Small bowel diverticula without diverticulitis. The appendix appears grossly unremarkable. Diffuse colonic  diverticulosis. There is low-density wall thickening involving the terminal ileum, cecum and ascending colon which extends to a lesser extent throughout the descending colon and into the sigmoid. Without significant adjacent inflammatory stranding. Vascular/Lymphatic: Aortic atherosclerosis without aneurysmal dilation. No e pathologically enlarged abdominal or pelvic lymph nodes. Reproductive: Uterus and bilateral adnexa are unremarkable. Other: No significant abdominopelvic ascites. There is mild nonspecific subcutaneous stranding. Musculoskeletal: No acute or significant osseous findings. Multilevel degenerative changes spine. No acute osseous abnormality. Calcific density in the subcutaneous tissues overlying the left gluteal musculature likely sequela of prior trauma or injection. IMPRESSION: 1. Low-density wall thickening involving the terminal ileum, cecum and ascending colon extending to a lesser extent throughout the descending colon and into the sigmoid. Without significant adjacent inflammatory stranding. Findings are nonspecific and may represent an infectious or inflammatory enterocolitis or be sequela of hypoalbuminemia. 2. Mild gastric wall thickening of the antrum, which may represent  gastritis or represent sequela of hypoalbuminemia. 3. Small bowel diverticula and diffuse colonic diverticulosis without evidence of diverticulitis. 4. Hepatic steatosis. 5. Aortic atherosclerosis. Aortic Atherosclerosis (ICD10-I70.0). Electronically Signed   By: Dahlia Bailiff MD   On: 06/08/2020 11:00   US Abdomen Limited RUQ (LIVER/GB)  Result Date: 06/08/2020 CLINICAL DATA:  Elevated LFTs EXAM: ULTRASOUND ABDOMEN LIMITED RIGHT UPPER QUADRANT COMPARISON:  CT abdomen pelvis 02/25/2015 FINDINGS: Gallbladder: Nondilated. There is a small amount of intraluminal sludge with no large stones identified. There is no significant wall thickening. No sonographic Murphy sign noted by sonographer. Common bile duct: Diameter: 2.7 mm Liver: Diffusely increased liver echogenicity with coarse echotexture. No focal lesion identified on this limited exam. Portal vein is patent on color Doppler imaging with normal direction of blood flow towards the liver. Other: None. IMPRESSION: Increased liver echogenicity with coarse echotexture compatible with chronic liver disease with element of steatosis. No evidence of cholecystitis. Electronically Signed   By: Maurine Simmering   On: 06/08/2020 09:32    EKG: Independently reviewed.  Pending.  Assessment/Plan Principal Problem:   Acute liver failure Active Problems:   Seizure disorder (HCC)   HTN (hypertension)   Alcohol abuse   Chronic pancreatitis (HCC)   Protein-calorie malnutrition, severe (HCC)   Alcoholic hepatitis without ascites   Hypomagnesemia   Thrombocytopenia (HCC)   Polysubstance abuse ([place])     1.  Acute liver failure with history of chronic alcoholic liver disease: Acute on chronic thrombocytopenia.  No active bleeding. Mental status is normal.  Madrey discriminant score is 15.  No indication for steroids. Symptomatic treatment.  IV fluids.  Nausea medications.  Recheck LFTs, INR and ammonia in the morning. Will follow GI recommendations. Poor prognosis  with ongoing alcohol use.  2.  Abdominal pain: Probably due to diffuse enteritis.  No evidence of peritonitis.  Lipase is normal however, she may still have pancreatitis. Symptomatic treatment.  Currently without nausea vomiting.  Will allow clears.  Pain medications and nausea medications.  3.  Severe electrolyte abnormalities: Hypokalemia and hypomagnesemia.  Replace aggressively today.  Recheck levels tomorrow.  Check phosphorus. Received 2 g magnesium, total 4 g magnesium today.  4.  Alcoholism/high risk of alcohol withdrawal: We will start on CIWA protocol with benzodiazepines.  Multivitamins.  5.  Severe protein calorie malnutrition: We will be seen by nutrition team.  Probably chronic and untreatable.   DVT prophylaxis: SCDs Code Status: Full code Family Communication: S/O mr Owens Shark called to update . No answer. Disposition Plan: Unknown Consults called: Gastroenterology by ER Admission status: Telemetry.  Inpatient.  Barb Merino MD [facility] Pager [phone]

## 2020-06-09 ENCOUNTER — Other Ambulatory Visit: Payer: Self-pay

## 2020-06-09 DIAGNOSIS — K7200 Acute and subacute hepatic failure without coma: Secondary | ICD-10-CM | POA: Diagnosis not present

## 2020-06-09 LAB — TYPE AND SCREEN
ABO/RH(D): B POS
Antibody Screen: NEGATIVE

## 2020-06-09 LAB — CBC
HCT: 28.2 % — ABNORMAL LOW (ref 36.0–46.0)
Hemoglobin: 10.1 g/dL — ABNORMAL LOW (ref 12.0–15.0)
MCH: 31.1 pg (ref 26.0–34.0)
MCHC: 35.8 g/dL (ref 30.0–36.0)
MCV: 86.8 fL (ref 80.0–100.0)
Platelets: 21 10*3/uL — CL (ref 150–400)
RBC: 3.25 MIL/uL — ABNORMAL LOW (ref 3.87–5.11)
RDW: 18.3 % — ABNORMAL HIGH (ref 11.5–15.5)
WBC: 3.5 10*3/uL — ABNORMAL LOW (ref 4.0–10.5)
nRBC: 0 % (ref 0.0–0.2)

## 2020-06-09 LAB — COMPREHENSIVE METABOLIC PANEL
ALT: 45 U/L — ABNORMAL HIGH (ref 0–44)
AST: 141 U/L — ABNORMAL HIGH (ref 15–41)
Albumin: 2.9 g/dL — ABNORMAL LOW (ref 3.5–5.0)
Alkaline Phosphatase: 169 U/L — ABNORMAL HIGH (ref 38–126)
Anion gap: 13 (ref 5–15)
BUN: <5 mg/dL — ABNORMAL LOW (ref 6–20)
CO2: 25 mmol/L (ref 22–32)
Calcium: 6 mg/dL — CL (ref 8.9–10.3)
Chloride: 93 mmol/L — ABNORMAL LOW (ref 98–111)
Creatinine, Ser: 0.58 mg/dL (ref 0.44–1.00)
GFR, Estimated: >60 mL/min (ref >60)
Glucose, Bld: 230 mg/dL — ABNORMAL HIGH (ref 70–99)
Potassium: 3.6 mmol/L (ref 3.5–5.1)
Sodium: 131 mmol/L — ABNORMAL LOW (ref 135–145)
Total Bilirubin: 4.1 mg/dL — ABNORMAL HIGH (ref 0.3–1.2)
Total Protein: 7 g/dL (ref 6.5–8.1)

## 2020-06-09 LAB — SARS CORONAVIRUS 2 (TAT 6-24 HRS): SARS Coronavirus 2: NEGATIVE

## 2020-06-09 LAB — PHOSPHORUS: Phosphorus: 1 mg/dL — CL (ref 2.5–4.6)

## 2020-06-09 LAB — PROTIME-INR
INR: 1.2 (ref 0.8–1.2)
Prothrombin Time: 14.8 seconds (ref 11.4–15.2)

## 2020-06-09 LAB — AMMONIA: Ammonia: 54 umol/L — ABNORMAL HIGH (ref 9–35)

## 2020-06-09 LAB — CALCIUM, IONIZED

## 2020-06-09 LAB — MAGNESIUM: Magnesium: 1.1 mg/dL — ABNORMAL LOW (ref 1.7–2.4)

## 2020-06-09 LAB — GLUCOSE, CAPILLARY: Glucose-Capillary: 207 mg/dL — ABNORMAL HIGH (ref 70–99)

## 2020-06-09 MED ORDER — MAGNESIUM SULFATE 4 GM/100ML IV SOLN
4.0000 g | Freq: Once | INTRAVENOUS | Status: AC
  Administered 2020-06-09: 4 g via INTRAVENOUS
  Filled 2020-06-09: qty 100

## 2020-06-09 MED ORDER — K PHOS MONO-SOD PHOS DI & MONO 155-852-130 MG PO TABS
500.0000 mg | ORAL_TABLET | Freq: Three times a day (TID) | ORAL | Status: DC
  Administered 2020-06-09 – 2020-06-10 (×6): 500 mg via ORAL
  Filled 2020-06-09 (×7): qty 2

## 2020-06-09 MED ORDER — SODIUM CHLORIDE 0.9 % IV SOLN: INTRAVENOUS | Status: DC

## 2020-06-09 MED ORDER — SODIUM CHLORIDE 0.9% IV SOLUTION
Freq: Once | INTRAVENOUS | Status: AC
Start: 2020-06-09 — End: 2020-06-09

## 2020-06-09 MED ORDER — CALCIUM GLUCONATE-NACL 1-0.675 GM/50ML-% IV SOLN
1.0000 g | Freq: Once | INTRAVENOUS | Status: AC
  Administered 2020-06-09: 1000 mg via INTRAVENOUS
  Filled 2020-06-09: qty 50

## 2020-06-09 MED ORDER — K PHOS MONO-SOD PHOS DI & MONO 155-852-130 MG PO TABS
250.0000 mg | ORAL_TABLET | Freq: Once | ORAL | Status: AC
  Administered 2020-06-09: 250 mg via ORAL
  Filled 2020-06-09: qty 1

## 2020-06-09 MED ORDER — ENSURE ENLIVE PO LIQD
237.0000 mL | Freq: Three times a day (TID) | ORAL | Status: DC
  Administered 2020-06-09 – 2020-06-10 (×5): 237 mL via ORAL

## 2020-06-09 MED ORDER — LACTULOSE 10 GM/15ML PO SOLN
10.0000 g | Freq: Three times a day (TID) | ORAL | Status: DC
  Administered 2020-06-09 – 2020-06-10 (×6): 10 g via ORAL
  Filled 2020-06-09 (×6): qty 30

## 2020-06-09 MED ORDER — PANTOPRAZOLE SODIUM 40 MG PO TBEC
40.0000 mg | DELAYED_RELEASE_TABLET | Freq: Two times a day (BID) | ORAL | Status: DC
  Administered 2020-06-09 – 2020-06-10 (×3): 40 mg via ORAL
  Filled 2020-06-09 (×3): qty 1

## 2020-06-09 NOTE — Progress Notes (Signed)
[facility] Gastroenterology Progress Note  Brandy Bishop 60 y.o. [birth year]  CC:  Abnormal LFTs  Subjective: Patient reports improvement in RUQ discomfort. Denies current nausea/vomiting.  Has not had a BM but states she does not want a bowel regimen.  States she is tired.  ROS : Review of Systems  Constitutional: Positive for malaise/fatigue.  Cardiovascular: Negative for chest pain and palpitations.  Gastrointestinal: Positive for constipation. Negative for abdominal pain, blood in stool, diarrhea, heartburn, melena, nausea and vomiting.    Objective: Vital signs in last 24 hours: Vitals:   06/09/20 1213 06/09/20 1308  BP: 120/81 112/78  Pulse: 90 86  Resp:    Temp: 98.2 F (36.8 C) 99.1 F (37.3 C)  SpO2: 99% 98%    Physical Exam:  General:  Lethargic, oriented, cooperative, no distress  Head:  Normocephalic, without obvious abnormality, atraumatic  Eyes:  Icteric sclera, EOMs intact  Lungs:   Clear to auscultation bilaterally, respirations unlabored  Heart:  Regular rate and rhythm, S1, S2 normal  Abdomen:   Soft, non-tender, bowel sounds active all four quadrants  Extremities: Extremities normal, atraumatic, no  edema  Neuro: Lethargic but oriented x 3, no asterixis    Lab Results: Recent Labs    06/08/20 0815 06/08/20 0942 06/09/20 0315  NA 140  --  131*  K 3.2*  --  3.6  CL 95*  --  93*  CO2 17*  --  25  GLUCOSE 89  --  230*  BUN <5*  --  <5*  CREATININE 0.80  --  0.58  CALCIUM 6.4*  --  6.0*  MG  --  0.6* 1.1*  PHOS  --   --  1.0*   Recent Labs    06/08/20 0815 06/09/20 0315  AST 184* 141*  ALT 54* 45*  ALKPHOS 185* 169*  BILITOT 4.5* 4.1*  PROT 7.6 7.0  ALBUMIN 3.2* 2.9*   Recent Labs    06/08/20 0815 06/09/20 0315  WBC 4.4 3.5*  HGB 10.4* 10.1*  HCT 30.4* 28.2*  MCV 90.7 86.8  PLT 29* 21*   Recent Labs    06/08/20 0942 06/09/20 0315  LABPROT 16.6* 14.8  INR 1.3* 1.2      Assessment: Abnormal liver enzymes likely  reflective of acute alcoholic hepatitis, without encephalopathy or significant coagulopathy.  Hepatic discriminant function remains <32. -T. Bili 4.1/ AST 141/ ALT 45/ ALP 167 -Normal PT 14.8/ INR 1.2  Severe thrombocytopenia: platelets 21K/uL  Findings of gastritis, enterocolitis likely nonspecific given hypoalbuminemia, serum albumin 2.9  Multiple electrolyte abnormalities  Severe protein calorie malnutrition  Plan: Continued supportive management, IV fluids, electrolyte replacement, folic acid, multivitamin, thiamine.  Not a candidate for prednisolone use, given hepatic discriminant function <32.  Recommend monitoring for alcohol withdrawal.  Complete alcohol cessation discussed with the patient.  Soft diet OK.  Protonix 40 mg BID.  Salley Slaughter PA-C 06/09/2020, 1:25 PM  Contact #  [phone]

## 2020-06-09 NOTE — Progress Notes (Signed)
Resumed care of patient from Barry, Therapist, sports. Assisted pt with FWW to restroom and back to bed. Pt with no complaints at this time. Platelets currently transfusing. Will continue to monitor patient.

## 2020-06-09 NOTE — Progress Notes (Signed)
PROGRESS NOTE    Brandy Bishop  QQP:[id number] DOB: 08-24-1960 DOA: 06/08/2020 PCP: Eston Esters, NP   Chief Complain: Abdominal pain  Brief Narrative: Patient is a 60 year old female with history of chronic alcoholism, GI bleed, chronic alcoholic pancreatitis, seizure disorder related to polysubstance abuse and alcohol withdrawal, chronic liver disease, chronic thrombocytopenia obesity emergency part with complaints of 4 days history of abdominal pain.  Abdomen pain was diffuse, more on the right lower quadrant and right upper quadrant.  Last drink was a day before admission.  On presentation she was hypotensive, had low-grade fever.  Lab work showed hypokalemia, hypomagnesemia, hypocalcemia, elevated liver enzymes, thrombocytopenia, elevated bilirubin.  CT abdomen/pelvis showed diffuse wall thickening of the bowel suggestive of enterocolitis.  Patient was admitted, started on IV fluids, aggressive electrolyte replacement and GI consulted.  Also started on CIWA protocol.  Assessment & Plan:   Principal Problem:   Acute liver failure Active Problems:   Seizure disorder (HCC)   HTN (hypertension)   Alcohol abuse   Chronic pancreatitis (HCC)   Protein-calorie malnutrition, severe (HCC)   Alcoholic hepatitis without ascites   Hypomagnesemia   Thrombocytopenia (HCC)   Polysubstance abuse (HCC)   Alcoholic hepatitis/chronic alcoholic liver disease: Presents with abdominal pain.  Lab work showed elevated liver enzymes, thrombocytopenia, elevated INR. Madrey discriminant score is 15 on admission GI consulted.  Continue supportive care. Also has mild elevated ammonia level, started on lactulose. Started on CIWA protocol.  Started on thiamine, folic acid She says she drinks 2-3 bottles of 40 ounce beer/vodka  daily.  Counseled for cessation. She lives with her boyfriend who also is a heavy drinker.  Will request for [facility] consultation for rehabilitation resources  Electrolyte  abnormalities: Presented with hypophosphatemia, hypocalcemia, hypomagnesemia, hypokalemia.  This is most likely associated with poor solute intake, poor nutritional status overall due to chronic alcoholism.  We have consulted dietitian. We will aggressively supplement electrolytes and monitor daily She looks very dehydrated, started on normal saline.  Thrombocytopenia: Severe.  Likely associated with chronic liver disease from alcohol.  Platelet  level of 21 today.  S/P transfusion  with 2 units of platelets.  Hemoglobin currently stable.  Abdominal pain/enterocolitis: CT abdomen/pelvis showed diffuse thickening of bowel wall.  Suggesting enterocolitis.  Most likely associated with severe hypoalbuminemia.  No evidence of cholecystitis.  Lipase normal.  Denies any abdominal pain today.  We will advance the diet to soft  Severe protein calorie monitoring: Nutrition consulted.  HTN: Currently blood pressure soft.  Monitor blood pressure.  Antihypertensives on hold.  Deconditioning/debility: We will request a PT/OT evaluation               DVT prophylaxis:SCD Code Status: Full Family Communication: Boyfriend on phone on 06/09/2020 Status is: Inpatient  Remains inpatient appropriate because:Inpatient level of care appropriate due to severity of illness   Dispo: The patient is from: Home              Anticipated d/c is to: Home              Patient currently is not medically stable to d/c.   Difficult to place patient No     Consultants: GI  Procedures:None  Antimicrobials:  Anti-infectives (From admission, onward)   None      Subjective: Patient seen and examined the bedside this morning.  Hemodynamically stable during my evaluation.  She is currently alert and oriented.  Denies any abdomen, nausea, vomiting or chest pain.  Very deconditioned, cachectic  Objective: Vitals:   06/08/20 2300 06/09/20 0000 06/09/20 0257 06/09/20 0615  BP: (!) 145/99  125/86 113/81   Pulse: (!) 101  81 77  Resp:   20 20  Temp:   (!) 100.4 F (38 C) 99.5 F (37.5 C)  TempSrc:   Oral Oral  SpO2:   100% 100%  Height:  5\' 3"  (1.6 m)      Intake/Output Summary (Last 24 hours) at 06/09/2020 0840 Last data filed at 06/09/2020 0600 Gross per 24 hour  Intake 3398.13 ml  Output 250 ml  Net 3148.13 ml   There were no vitals filed for this visit.  Examination:  General exam: Chronically ill looking, weak, cachectic HEENT: PERRL Respiratory system:  no wheezes or crackles  Cardiovascular system: S1 & S2 heard, RRR.  Gastrointestinal system: Abdomen is nondistended, soft and nontender. Central nervous system: Alert and oriented Extremities: No edema, no clubbing ,no cyanosis Skin: No rashes, no ulcers,no icterus , loss of subcutaneous fat   Data Reviewed: I have personally reviewed following labs and imaging studies  CBC: Recent Labs  Lab 06/08/20 0815 06/09/20 0315  WBC 4.4 3.5*  HGB 10.4* 10.1*  HCT 30.4* 28.2*  MCV 90.7 86.8  PLT 29* 21*   Basic Metabolic Panel: Recent Labs  Lab 06/08/20 0815 06/08/20 0942 06/09/20 0315  NA 140  --  131*  K 3.2*  --  3.6  CL 95*  --  93*  CO2 17*  --  25  GLUCOSE 89  --  230*  BUN <5*  --  <5*  CREATININE 0.80  --  0.58  CALCIUM 6.4*  --  6.0*  MG  --  0.6* 1.1*  PHOS  --   --  1.0*   GFR: CrCl cannot be calculated (Unknown ideal weight.). Liver Function Tests: Recent Labs  Lab 06/08/20 0815 06/09/20 0315  AST 184* 141*  ALT 54* 45*  ALKPHOS 185* 169*  BILITOT 4.5* 4.1*  PROT 7.6 7.0  ALBUMIN 3.2* 2.9*   Recent Labs  Lab 06/08/20 0815  LIPASE 19   Recent Labs  Lab 06/08/20 1430 06/09/20 0315  AMMONIA 41* 54*   Coagulation Profile: Recent Labs  Lab 06/08/20 0942 06/09/20 0315  INR 1.3* 1.2   Cardiac Enzymes: No results for input(s): CKTOTAL, CKMB, CKMBINDEX, TROPONINI in the last 168 hours. BNP (last 3 results) No results for input(s): PROBNP in the last 8760 hours. HbA1C: No  results for input(s): HGBA1C in the last 72 hours. CBG: Recent Labs  Lab 06/08/20 2311 06/09/20 0825  GLUCAP 207* 207*   Lipid Profile: No results for input(s): CHOL, HDL, LDLCALC, TRIG, CHOLHDL, LDLDIRECT in the last 72 hours. Thyroid Function Tests: No results for input(s): TSH, T4TOTAL, FREET4, T3FREE, THYROIDAB in the last 72 hours. Anemia Panel: No results for input(s): VITAMINB12, FOLATE, FERRITIN, TIBC, IRON, RETICCTPCT in the last 72 hours. Sepsis Labs: Recent Labs  Lab 06/08/20 0847 06/08/20 1055  LATICACIDVEN 2.2* 2.0*    Recent Results (from the past 240 hour(s))  SARS CORONAVIRUS 2 (TAT 6-24 HRS) Nasopharyngeal Nasopharyngeal Swab     Status: None   Collection Time: 06/09/20 12:55 AM   Specimen: Nasopharyngeal Swab  Result Value Ref Range Status   SARS Coronavirus 2 NEGATIVE NEGATIVE Final    Comment: (NOTE) SARS-CoV-2 target nucleic acids are NOT DETECTED.  The SARS-CoV-2 RNA is generally detectable in upper and lower respiratory specimens during the acute phase of infection. Negative results do not preclude SARS-CoV-2 infection, do  not rule out co-infections with other pathogens, and should not be used as the sole basis for treatment or other patient management decisions. Negative results must be combined with clinical observations, patient history, and epidemiological information. The expected result is Negative.  Fact Sheet for Patients: SugarRoll.be  Fact Sheet for Healthcare Providers: https://www.woods-mathews.com/  This test is not yet approved or cleared by the [place] FDA and  has been authorized for detection and/or diagnosis of SARS-CoV-2 by FDA under an Emergency Use Authorization (EUA). This EUA will remain  in effect (meaning this test can be used) for the duration of the COVID-19 declaration under Se ction 564(b)(1) of the Act, 21 U.S.C. section 360bbb-3(b)(1), unless the authorization is  terminated or revoked sooner.  Performed at [facility], [place] [address] [place],  [address]          Radiology Studies: CT Abdomen Pelvis W Contrast  Result Date: 06/08/2020 CLINICAL DATA:  Right lower quadrant abdominal pain x4 days, nausea vomiting constipation EXAM: CT ABDOMEN AND PELVIS WITH CONTRAST TECHNIQUE: Multidetector CT imaging of the abdomen and pelvis was performed using the standard protocol following bolus administration of intravenous contrast. CONTRAST:  144mL OMNIPAQUE IOHEXOL 300 MG/ML  SOLN COMPARISON:  Same day abdominal ultrasound and CT February 25, 2015 FINDINGS: Lower chest: Bibasilar atelectasis. Hepatobiliary: Diffusely decreased attenuation of the hepatic parenchyma. No suspicious hepatic lesion. No biliary ductal dilation. Pancreas: Similar pancreatic atrophy with mild ductal dilation in the tail and dystrophic calcifications head/neck. No evidence of acute pancreatic inflammation. Spleen: Within normal limits. Adrenals/Urinary Tract: Bilateral adrenal glands within normal limits. No hydronephrosis. No solid enhancing renal masses. Urinary a bladder is within normal limits. Stomach/Bowel: There is mild gastric wall thickening of the antrum. No pathologically dilated loops of small bowel. Small bowel diverticula without diverticulitis. The appendix appears grossly unremarkable. Diffuse colonic diverticulosis. There is low-density wall thickening involving the terminal ileum, cecum and ascending colon which extends to a lesser extent throughout the descending colon and into the sigmoid. Without significant adjacent inflammatory stranding. Vascular/Lymphatic: Aortic atherosclerosis without aneurysmal dilation. No e pathologically enlarged abdominal or pelvic lymph nodes. Reproductive: Uterus and bilateral adnexa are unremarkable. Other: No significant abdominopelvic ascites. There is mild nonspecific subcutaneous stranding. Musculoskeletal: No acute or  significant osseous findings. Multilevel degenerative changes spine. No acute osseous abnormality. Calcific density in the subcutaneous tissues overlying the left gluteal musculature likely sequela of prior trauma or injection. IMPRESSION: 1. Low-density wall thickening involving the terminal ileum, cecum and ascending colon extending to a lesser extent throughout the descending colon and into the sigmoid. Without significant adjacent inflammatory stranding. Findings are nonspecific and may represent an infectious or inflammatory enterocolitis or be sequela of hypoalbuminemia. 2. Mild gastric wall thickening of the antrum, which may represent gastritis or represent sequela of hypoalbuminemia. 3. Small bowel diverticula and diffuse colonic diverticulosis without evidence of diverticulitis. 4. Hepatic steatosis. 5. Aortic atherosclerosis. Aortic Atherosclerosis (ICD10-I70.0). Electronically Signed   By: Dahlia Bailiff MD   On: 06/08/2020 11:00   US Abdomen Limited RUQ (LIVER/GB)  Result Date: 06/08/2020 CLINICAL DATA:  Elevated LFTs EXAM: ULTRASOUND ABDOMEN LIMITED RIGHT UPPER QUADRANT COMPARISON:  CT abdomen pelvis 02/25/2015 FINDINGS: Gallbladder: Nondilated. There is a small amount of intraluminal sludge with no large stones identified. There is no significant wall thickening. No sonographic Murphy sign noted by sonographer. Common bile duct: Diameter: 2.7 mm Liver: Diffusely increased liver echogenicity with coarse echotexture. No focal lesion identified on this limited exam. Portal vein  is patent on color Doppler imaging with normal direction of blood flow towards the liver. Other: None. IMPRESSION: Increased liver echogenicity with coarse echotexture compatible with chronic liver disease with element of steatosis. No evidence of cholecystitis. Electronically Signed   By: Maurine Simmering   On: 06/08/2020 09:32        Scheduled Meds: . sodium chloride   Intravenous Once  . folic acid  1 mg Oral Daily  .  lactulose  10 g Oral TID  . LORazepam  0-4 mg Oral Q6H   Followed by  . [START ON 06/10/2020] LORazepam  0-4 mg Oral Q12H  . multivitamin with minerals  1 tablet Oral Daily  . pantoprazole  40 mg Oral Daily  . phosphorus  500 mg Oral TID  . thiamine  100 mg Oral Daily   Or  . thiamine  100 mg Intravenous Daily  . traZODone  50 mg Oral QHS   Continuous Infusions: . magnesium sulfate bolus IVPB       LOS: 1 day    Time spent: 35 mins.More than 50% of that time was spent in counseling and/or coordination of care.      Shelly Coss, MD [facility] P5/07/2020, 8:40 AM

## 2020-06-09 NOTE — Progress Notes (Signed)
Initial Nutrition Assessment  DOCUMENTATION CODES:   Severe malnutrition in context of chronic illness  INTERVENTION:   Monitor magnesium, potassium, and phosphorus daily for at least 3 days, MD to replete as needed, as pt is at risk for refeeding syndrome.  -Ensure Enlive po TID, each supplement provides 350 kcal and 20 grams of protein  -Needs weight for admission  NUTRITION DIAGNOSIS:   Severe Malnutrition related to chronic illness (liver disease) as evidenced by severe fat depletion,severe muscle depletion,energy intake < or equal to 75% for > or equal to 1 month.  GOAL:   Patient will meet greater than or equal to 90% of their needs  MONITOR:   PO intake,Supplement acceptance,Labs,Weight trends,I & O's  REASON FOR ASSESSMENT:   Consult,Malnutrition Screening Tool Assessment of nutrition requirement/status  ASSESSMENT:   60 y.o. female with medical history significant of alcoholism with ongoing alcohol use, history of GI bleeding, chronic alcoholic pancreatitis, history of seizure disorder related to polysubstance use and alcohol withdrawal, chronic liver disease and chronic thrombocytopenia presents to the emergency room with ongoing abdominal pain for last 4 days.  According to the patient, she started having diffuse abdominal pain , more so towards the right lower quadrant and right upper quadrant, moderate in intensity, 6/10 at times, no relation to food intake or alcohol intake, without any associated nausea vomiting or diarrhea.  She continues to stay home.  She was still drinking until last night.  Today morning , she could not tolerate the pain so came to the ER.  Patient in room initially with no family but husband came in later during visit. Pt reports poor appetite. Per chart review, pt has been drinking alcohol mainly instead of eating (drinks "brown liquor" and beer daily).  Pt received full liquid tray for lunch, states she doesn't want to drink all the liquids  as she doesn't want to be "running to the bathroom all the time". No nausea currently.   Pt is agreeable to receiving Ensure supplements to drink.  Per weight records, last recorded weight is from October 2021 (121 lbs). Suspect weight has decreased. Needs weight for admission.   Medications: Folic acid, Lactulose, Multivitamin with minerals daily, K-Phos, Thiamine, Ca gluconate, IV Mg sulfate,   Labs reviewed:  CBGs: 207 Low Na, Phos (1.0), Mg (1.1)  NUTRITION - FOCUSED PHYSICAL EXAM:  Flowsheet Row Most Recent Value  Orbital Region Severe depletion  Upper Arm Region Severe depletion  Thoracic and Lumbar Region Unable to assess  Buccal Region Severe depletion  Temple Region Severe depletion  Clavicle Bone Region Severe depletion  Clavicle and Acromion Bone Region Severe depletion  Scapular Bone Region Severe depletion  Dorsal Hand Severe depletion  Patellar Region Unable to assess  Anterior Thigh Region Unable to assess  Posterior Calf Region Unable to assess  Edema (RD Assessment) None  Mouth Reviewed       Diet Order:   Diet Order            Diet full liquid Room service appropriate? Yes; Fluid consistency: Thin  Diet effective now                 EDUCATION NEEDS:   Education needs have been addressed  Skin:  Skin Assessment: Reviewed RN Assessment  Last BM:  PTA  Height:   Ht Readings from Last 1 Encounters:  06/09/20 5\' 3"  (1.6 m)    Weight:   Wt Readings from Last 1 Encounters:  11/14/19 55.3 kg   BMI:  Body mass index is 21.61 kg/m.  Estimated Nutritional Needs:   Kcal:  1650-1850  Protein:  80-95g  Fluid:  1.8L/day  Clayton Bibles, MS, RD, LDN Inpatient Clinical Dietitian Contact information available via Amion

## 2020-06-10 DIAGNOSIS — K7200 Acute and subacute hepatic failure without coma: Secondary | ICD-10-CM | POA: Diagnosis not present

## 2020-06-10 LAB — COMPREHENSIVE METABOLIC PANEL
ALT: 39 U/L (ref 0–44)
AST: 132 U/L — ABNORMAL HIGH (ref 15–41)
Albumin: 2.6 g/dL — ABNORMAL LOW (ref 3.5–5.0)
Alkaline Phosphatase: 150 U/L — ABNORMAL HIGH (ref 38–126)
Anion gap: 15 (ref 5–15)
BUN: <5 mg/dL — ABNORMAL LOW (ref 6–20)
CO2: 27 mmol/L (ref 22–32)
Calcium: 6.8 mg/dL — ABNORMAL LOW (ref 8.9–10.3)
Chloride: 96 mmol/L — ABNORMAL LOW (ref 98–111)
Creatinine, Ser: 0.54 mg/dL (ref 0.44–1.00)
GFR, Estimated: >60 mL/min (ref >60)
Glucose, Bld: 167 mg/dL — ABNORMAL HIGH (ref 70–99)
Potassium: 2.9 mmol/L — ABNORMAL LOW (ref 3.5–5.1)
Sodium: 138 mmol/L (ref 135–145)
Total Bilirubin: 3.2 mg/dL — ABNORMAL HIGH (ref 0.3–1.2)
Total Protein: 6.2 g/dL — ABNORMAL LOW (ref 6.5–8.1)

## 2020-06-10 LAB — CBC
HCT: 26 % — ABNORMAL LOW (ref 36.0–46.0)
Hemoglobin: 8.8 g/dL — ABNORMAL LOW (ref 12.0–15.0)
MCH: 31 pg (ref 26.0–34.0)
MCHC: 33.8 g/dL (ref 30.0–36.0)
MCV: 91.5 fL (ref 80.0–100.0)
Platelets: 96 10*3/uL — ABNORMAL LOW (ref 150–400)
RBC: 2.84 MIL/uL — ABNORMAL LOW (ref 3.87–5.11)
RDW: 19.9 % — ABNORMAL HIGH (ref 11.5–15.5)
WBC: 4.2 10*3/uL (ref 4.0–10.5)
nRBC: 0 % (ref 0.0–0.2)

## 2020-06-10 LAB — AMMONIA: Ammonia: 39 umol/L — ABNORMAL HIGH (ref 9–35)

## 2020-06-10 LAB — GLUCOSE, CAPILLARY: Glucose-Capillary: 152 mg/dL — ABNORMAL HIGH (ref 70–99)

## 2020-06-10 LAB — MAGNESIUM: Magnesium: 1.6 mg/dL — ABNORMAL LOW (ref 1.7–2.4)

## 2020-06-10 LAB — PHOSPHORUS: Phosphorus: 1.6 mg/dL — ABNORMAL LOW (ref 2.5–4.6)

## 2020-06-10 MED ORDER — POTASSIUM CHLORIDE CRYS ER 20 MEQ PO TBCR
40.0000 meq | EXTENDED_RELEASE_TABLET | Freq: Once | ORAL | Status: AC
  Administered 2020-06-10: 40 meq via ORAL
  Filled 2020-06-10: qty 2

## 2020-06-10 MED ORDER — MAGNESIUM SULFATE 2 GM/50ML IV SOLN
2.0000 g | Freq: Once | INTRAVENOUS | Status: AC
  Administered 2020-06-10: 2 g via INTRAVENOUS
  Filled 2020-06-10: qty 50

## 2020-06-10 MED ORDER — POTASSIUM CHLORIDE CRYS ER 20 MEQ PO TBCR
40.0000 meq | EXTENDED_RELEASE_TABLET | ORAL | Status: AC
  Administered 2020-06-10 (×2): 40 meq via ORAL
  Filled 2020-06-10 (×2): qty 2

## 2020-06-10 MED ORDER — POTASSIUM CHLORIDE CRYS ER 20 MEQ PO TBCR: 40.0000 meq | EXTENDED_RELEASE_TABLET | ORAL | Status: DC

## 2020-06-10 NOTE — Progress Notes (Signed)
PROGRESS NOTE    Brandy Bishop  ZDG:[id number] DOB: [birth year] DOA: 06/08/2020 PCP: Eston Esters, NP   Chief Complain: Abdominal pain  Brief Narrative: Patient is a 60 year old female with history of chronic alcoholism, GI bleed, chronic alcoholic pancreatitis, seizure disorder related to polysubstance abuse and alcohol withdrawal, chronic liver disease, chronic thrombocytopenia obesity emergency part with complaints of 4 days history of abdominal pain.  Abdomen pain was diffuse, more on the right lower quadrant and right upper quadrant.  Last drink was a day before admission.  On presentation she was hypotensive, had low-grade fever.  Lab work showed hypokalemia, hypomagnesemia, hypocalcemia, elevated liver enzymes, thrombocytopenia, elevated bilirubin.  CT abdomen/pelvis showed diffuse wall thickening of the bowel suggestive of enterocolitis.  Patient was admitted, started on IV fluids, aggressive electrolyte replacement and GI consulted.  Also started on CIWA protocol.  Overall status has  improved.  Waiting for PT/OT evaluation.  Possible plan for discharge tomorrow.  Assessment & Plan:   Principal Problem:   Acute liver failure Active Problems:   Seizure disorder (HCC)   HTN (hypertension)   Alcohol abuse   Chronic pancreatitis (HCC)   Protein-calorie malnutrition, severe (HCC)   Alcoholic hepatitis without ascites   Hypomagnesemia   Thrombocytopenia (HCC)   Polysubstance abuse (HCC)   Alcoholic hepatitis/chronic alcoholic liver disease: Presents with abdominal pain.  Lab work showed elevated liver enzymes, thrombocytopenia, elevated INR. Madrey discriminant score is 15 on admission GI consulted.  Continue supportive care. Also has mild elevated ammonia level, started on lactulose. Started on CIWA protocol.  Started on thiamine, folic acid She says she drinks 2-3 bottles of 40 ounce beer/vodka  daily.  Counseled for cessation. She lives with her boyfriend who also is  a heavy drinker.  Will request for [facility] consultation for rehabilitation resources. Currently not in withdrawal, alert and oriented.  She looks much better today.  Electrolyte abnormalities: Presented with hypophosphatemia, hypocalcemia, hypomagnesemia, hypokalemia.  This is most likely associated with poor solute intake, poor nutritional status overall due to chronic alcoholism.  We have consulted dietitian. We will aggressively supplement electrolytes and monitor daily She looked very dehydrated, started on normal saline.  Thrombocytopenia: Severe.  Likely associated with chronic liver disease from alcohol.  Platelet  level of 21,  S/P transfusion  with 2 units of platelets.  Platelets level in the range of 90 today.  Hemoglobin currently stable.  Abdominal pain/enterocolitis: CT abdomen/pelvis showed diffuse thickening of bowel wall.  Suggesting enterocolitis.  Most likely associated with severe hypoalbuminemia.  No evidence of cholecystitis.  Lipase normal.  Denies any abdominal pain today. Tolerating soft diet  Severe protein calorie monitoring: Nutrition consulted and following  HTN: Currently blood pressure stable.  Monitor blood pressure.  Antihypertensives on hold.  Deconditioning/debility: We have requested for PT/OT evaluation        Nutrition Problem: Severe Malnutrition Etiology: chronic illness (liver disease)      DVT prophylaxis:SCD Code Status: Full Family Communication: Boyfriend on phone on 06/09/2020 Status is: Inpatient  Remains inpatient appropriate because:Inpatient level of care appropriate due to severity of illness   Dispo: The patient is from: Home              Anticipated d/c is to: Home              Patient currently is not medically stable to d/c.   Difficult to place patient No     Consultants: GI  Procedures:None  Antimicrobials:  Anti-infectives (From admission, onward)  None      Subjective: Patient seen and examined the bedside  this morning.  Hemodynamically stable.  She looks much better today.  Comfortable.  Alert and oriented.  Denies any abdomen pain, nausea or vomiting.  Objective: Vitals:   06/09/20 1752 06/09/20 1936 06/10/20 0441 06/10/20 0600  BP: (!) 143/96 (!) 138/91 110/73 110/73  Pulse: 92 98    Resp:  17 18   Temp: 99.6 F (37.6 C) 99.1 F (37.3 C) 99.8 F (37.7 C)   TempSrc: Oral Oral Oral   SpO2: 100% 98%    Height:        Intake/Output Summary (Last 24 hours) at 06/10/2020 1139 Last data filed at 06/10/2020 0200 Gross per 24 hour  Intake 1497.92 ml  Output --  Net 1497.92 ml   There were no vitals filed for this visit.  Examination:  General exam: Overall comfortable, not in distress, cachectic/malnourished HEENT: PERRL Respiratory system:  no wheezes or crackles  Cardiovascular system: S1 & S2 heard, RRR.  Gastrointestinal system: Abdomen is nondistended, soft and nontender. Central nervous system: Alert and oriented Extremities: No edema, no clubbing ,no cyanosis Skin: No rashes, no ulcers,no icterus   Data Reviewed: I have personally reviewed following labs and imaging studies  CBC: Recent Labs  Lab 06/08/20 0815 06/09/20 0315 06/10/20 0412  WBC 4.4 3.5* 4.2  HGB 10.4* 10.1* 8.8*  HCT 30.4* 28.2* 26.0*  MCV 90.7 86.8 91.5  PLT 29* 21* 96*   Basic Metabolic Panel: Recent Labs  Lab 06/08/20 0815 06/08/20 0942 06/09/20 0315 06/10/20 0032  NA 140  --  131* 138  K 3.2*  --  3.6 2.9*  CL 95*  --  93* 96*  CO2 17*  --  25 27  GLUCOSE 89  --  230* 167*  BUN <5*  --  <5* <5*  CREATININE 0.80  --  0.58 0.54  CALCIUM 6.4*  --  6.0* 6.8*  MG  --  0.6* 1.1* 1.6*  PHOS  --   --  1.0* 1.6*   GFR: CrCl cannot be calculated (Unknown ideal weight.). Liver Function Tests: Recent Labs  Lab 06/08/20 0815 06/09/20 0315 06/10/20 0032  AST 184* 141* 132*  ALT 54* 45* 39  ALKPHOS 185* 169* 150*  BILITOT 4.5* 4.1* 3.2*  PROT 7.6 7.0 6.2*  ALBUMIN 3.2* 2.9* 2.6*    Recent Labs  Lab 06/08/20 0815  LIPASE 19   Recent Labs  Lab 06/08/20 1430 06/09/20 0315 06/10/20 0032  AMMONIA 41* 54* 39*   Coagulation Profile: Recent Labs  Lab 06/08/20 0942 06/09/20 0315  INR 1.3* 1.2   Cardiac Enzymes: No results for input(s): CKTOTAL, CKMB, CKMBINDEX, TROPONINI in the last 168 hours. BNP (last 3 results) No results for input(s): PROBNP in the last 8760 hours. HbA1C: No results for input(s): HGBA1C in the last 72 hours. CBG: Recent Labs  Lab 06/08/20 2311 06/09/20 0825 06/10/20 0733  GLUCAP 207* 207* 152*   Lipid Profile: No results for input(s): CHOL, HDL, LDLCALC, TRIG, CHOLHDL, LDLDIRECT in the last 72 hours. Thyroid Function Tests: No results for input(s): TSH, T4TOTAL, FREET4, T3FREE, THYROIDAB in the last 72 hours. Anemia Panel: No results for input(s): VITAMINB12, FOLATE, FERRITIN, TIBC, IRON, RETICCTPCT in the last 72 hours. Sepsis Labs: Recent Labs  Lab 06/08/20 0847 06/08/20 1055  LATICACIDVEN 2.2* 2.0*    Recent Results (from the past 240 hour(s))  SARS CORONAVIRUS 2 (TAT 6-24 HRS) Nasopharyngeal Nasopharyngeal Swab     Status:  None   Collection Time: 06/09/20 12:55 AM   Specimen: Nasopharyngeal Swab  Result Value Ref Range Status   SARS Coronavirus 2 NEGATIVE NEGATIVE Final    Comment: (NOTE) SARS-CoV-2 target nucleic acids are NOT DETECTED.  The SARS-CoV-2 RNA is generally detectable in upper and lower respiratory specimens during the acute phase of infection. Negative results do not preclude SARS-CoV-2 infection, do not rule out co-infections with other pathogens, and should not be used as the sole basis for treatment or other patient management decisions. Negative results must be combined with clinical observations, patient history, and epidemiological information. The expected result is Negative.  Fact Sheet for Patients: SugarRoll.be  Fact Sheet for Healthcare  Providers: https://www.woods-mathews.com/  This test is not yet approved or cleared by the [place] FDA and  has been authorized for detection and/or diagnosis of SARS-CoV-2 by FDA under an Emergency Use Authorization (EUA). This EUA will remain  in effect (meaning this test can be used) for the duration of the COVID-19 declaration under Se ction 564(b)(1) of the Act, 21 U.S.C. section 360bbb-3(b)(1), unless the authorization is terminated or revoked sooner.  Performed at [facility], [place] [address], [place], [place] [address]          Radiology Studies: No results found.      Scheduled Meds: . feeding supplement  237 mL Oral TID BM  . folic acid  1 mg Oral Daily  . lactulose  10 g Oral TID  . LORazepam  0-4 mg Oral Q6H   Followed by  . LORazepam  0-4 mg Oral Q12H  . multivitamin with minerals  1 tablet Oral Daily  . pantoprazole  40 mg Oral BID  . phosphorus  500 mg Oral TID  . potassium chloride  40 mEq Oral Q4H  . thiamine  100 mg Oral Daily   Or  . thiamine  100 mg Intravenous Daily  . traZODone  50 mg Oral QHS   Continuous Infusions: . sodium chloride 100 mL/hr at 06/10/20 0430     LOS: 2 days    Time spent: 25 mins.More than 50% of that time was spent in counseling and/or coordination of care.      Shelly Coss, MD [facility] P5/08/2020, 11:39 AM

## 2020-06-10 NOTE — Evaluation (Signed)
Occupational Therapy Evaluation Patient Details Name: Brandy Bishop MRN: [id number] DOB: May 24, 1960 Today's Date: 06/10/2020    History of Present Illness Patient admitted for acute liver failure. Patient is a 60 year old female with history of chronic alcoholism, GI bleed, chronic alcoholic pancreatitis, seizure disorder related to polysubstance abuse and alcohol withdrawal, chronic liver disease, chronic thrombocytopenia obesity emergency part with complaints of 4 days history of abdominal pain.   Clinical Impression   Brandy Bishop is a 60 year old female who demonstrates ability to ambulate without DME and perform ADLs. Patient reports she is at her baseline and can do "everything." Patient does not exhibit any overt loss of balance with ambulation but she does exhibit some safety concerns as she has to be cued to her IV line. Otherwise patient appears to be at her baseline and would like to go home soon.     Follow Up Recommendations  No OT follow up    Equipment Recommendations  None recommended by OT    Recommendations for Other Services       Precautions / Restrictions Precautions Precautions: Fall Restrictions Weight Bearing Restrictions: No      Mobility Bed Mobility               General bed mobility comments: sitting on side of bed.    Transfers Overall transfer level: Needs assistance Equipment used: None             General transfer comment: Patient ambulates in room without a device, without physical assistance and without overt loss of balance. Did need verbal cues to attend to IV pole/lines    Balance Overall balance assessment: Mild deficits observed, not formally tested                                         ADL either performed or assessed with clinical judgement   ADL Overall ADL's : At baseline                                       General ADL Comments: Demonstrates ability to don socks,  ambualte, stand at sink for grooming task. Nursing reporting patient able to perform toileting without assistance. Patient reports she is at her baseline.     Vision Patient Visual Report: No change from baseline       Perception     Praxis      Pertinent Vitals/Pain Pain Assessment: Faces Faces Pain Scale: No hurt     Hand Dominance Right   Extremity/Trunk Assessment Upper Extremity Assessment Upper Extremity Assessment: Overall WFL for tasks assessed   Lower Extremity Assessment Lower Extremity Assessment: Defer to PT evaluation   Cervical / Trunk Assessment Cervical / Trunk Assessment: Normal   Communication Communication Communication: No difficulties   Cognition Arousal/Alertness: Awake/alert Behavior During Therapy: WFL for tasks assessed/performed Overall Cognitive Status: Within Functional Limits for tasks assessed                                     General Comments       Exercises     Shoulder Instructions      Home Living Family/patient expects to be discharged to:: Private residence Living Arrangements: Spouse/significant other Available  Help at Discharge: Family;Available PRN/intermittently Type of Home: Apartment Home Access: Level entry     Home Layout: One level     Bathroom Shower/Tub: Teacher, early years/pre: Standard     Home Equipment: None          Prior Functioning/Environment Level of Independence: Independent        Comments: Reports independence with ADLs, ambulation and IADls. Reports walking to grocery store and shopping. Doesn't drive. Doesn't use a device. Takes a bath not a shower.        OT Problem List:        OT Treatment/Interventions:      OT Goals(Current goals can be found in the care plan section) Acute Rehab OT Goals OT Goal Formulation: All assessment and education complete, DC therapy  OT Frequency:     Barriers to D/C:            Co-evaluation               AM-PAC OT "6 Clicks" Daily Activity     Outcome Measure Help from another person eating meals?: None Help from another person taking care of personal grooming?: None Help from another person toileting, which includes using toliet, bedpan, or urinal?: None Help from another person bathing (including washing, rinsing, drying)?: None Help from another person to put on and taking off regular upper body clothing?: None Help from another person to put on and taking off regular lower body clothing?: None 6 Click Score: 24   End of Session Nurse Communication: Mobility status (informed RN of patient seated at side of bed)  Activity Tolerance: Patient tolerated treatment well Patient left: with call bell/phone within reach;in bed (at side of bed)  OT Visit Diagnosis: Unsteadiness on feet (R26.81)                Time: 8366-2947 OT Time Calculation (min): 15 min Charges:  OT General Charges $OT Visit: 1 Visit OT Evaluation $OT Eval Low Complexity: 1 Low  Jerrelle Michelsen, OTR/L [place]  Office [phone] Pager: [place] 06/10/2020, 11:58 AM

## 2020-06-10 NOTE — Progress Notes (Signed)
PT Note  Patient Details Name: Brandy Bishop MRN: [id number] DOB: 1960-12-24     Chart reviewed and spoke with pat at bedside. She stated she had been getting up and going back and foth tot the bathroom and walks a lot at home as well. She did not feel the need to get up at this time and feels not need for PT to assess her mobility. She states he legs work and she is fine when she gets up.   Also saw OT write and and she did well with OT earlier.   Will sign off due to pt request at this time. No PT needs at this time, please let us know if anything changes or appears different at any time. Thank you    Clide Dales 06/10/2020, 3:05 PM  Gatha Mayer, PT, MPT Acute Rehabilitation Services Office: [phone] Pager: [phone] 06/10/2020

## 2020-06-10 NOTE — Progress Notes (Signed)
Subjective: The patient states she had chicken and carrots daily. Denies abdominal pain. States she will be going home tomorrow, as states she does not want to be in the hospital on Mother's Day.  Objective: Vital signs in last 24 hours: Temp:  [98.9 F (37.2 C)-99.8 F (37.7 C)] 98.9 F (37.2 C) (05/07 1341) Pulse Rate:  [92-98] 95 (05/07 1341) Resp:  [17-18] 17 (05/07 1341) BP: (110-151)/(73-96) 151/95 (05/07 1341) SpO2:  [98 %-100 %] 100 % (05/07 1341) Weight change:  Last BM Date: 06/10/20  PE: Cachectic, frail, ill-appearing GENERAL: Prominent pallor, mild icterus ABDOMEN: Nondistended abdomen, normal active bowel sounds, alert awake oriented x3, no asterixis EXTREMITIES: No edema  Lab Results: Results for orders placed or performed during the hospital encounter of 06/08/20 (from the past 48 hour(s))  Glucose, capillary     Status: Abnormal   Collection Time: 06/08/20 11:11 PM  Result Value Ref Range   Glucose-Capillary 207 (H) 70 - 99 mg/dL    Comment: Glucose reference range applies only to samples taken after fasting for at least 8 hours.  SARS CORONAVIRUS 2 (TAT 6-24 HRS) Nasopharyngeal Nasopharyngeal Swab     Status: None   Collection Time: 06/09/20 12:55 AM   Specimen: Nasopharyngeal Swab  Result Value Ref Range   SARS Coronavirus 2 NEGATIVE NEGATIVE    Comment: (NOTE) SARS-CoV-2 target nucleic acids are NOT DETECTED.  The SARS-CoV-2 RNA is generally detectable in upper and lower respiratory specimens during the acute phase of infection. Negative results do not preclude SARS-CoV-2 infection, do not rule out co-infections with other pathogens, and should not be used as the sole basis for treatment or other patient management decisions. Negative results must be combined with clinical observations, patient history, and epidemiological information. The expected result is Negative.  Fact Sheet for Patients: SugarRoll.be  Fact Sheet  for Healthcare Providers: https://www.woods-mathews.com/  This test is not yet approved or cleared by the [place] FDA and  has been authorized for detection and/or diagnosis of SARS-CoV-2 by FDA under an Emergency Use Authorization (EUA). This EUA will remain  in effect (meaning this test can be used) for the duration of the COVID-19 declaration under Se ction 564(b)(1) of the Act, 21 U.S.C. section 360bbb-3(b)(1), unless the authorization is terminated or revoked sooner.  Performed at [facility], [place] [address], [place], [place] [address]   Comprehensive metabolic panel     Status: Abnormal   Collection Time: 06/09/20  3:15 AM  Result Value Ref Range   Sodium 131 (L) 135 - 145 mmol/L    Comment: DELTA CHECK NOTED   Potassium 3.6 3.5 - 5.1 mmol/L   Chloride 93 (L) 98 - 111 mmol/L   CO2 25 22 - 32 mmol/L   Glucose, Bld 230 (H) 70 - 99 mg/dL    Comment: Glucose reference range applies only to samples taken after fasting for at least 8 hours.   BUN <5 (L) 6 - 20 mg/dL   Creatinine, Ser 0.58 0.44 - 1.00 mg/dL   Calcium 6.0 (LL) 8.9 - 10.3 mg/dL    Comment: CRITICAL RESULT CALLED TO, READ BACK BY AND VERIFIED WITH: K,HINSON AT 0403 ON 06/09/20 BY A,MOHAMED    Total Protein 7.0 6.5 - 8.1 g/dL   Albumin 2.9 (L) 3.5 - 5.0 g/dL   AST 141 (H) 15 - 41 U/L   ALT 45 (H) 0 - 44 U/L   Alkaline Phosphatase 169 (H) 38 - 126 U/L   Total Bilirubin 4.1 (  H) 0.3 - 1.2 mg/dL   GFR, Estimated >60 >60 mL/min    Comment: (NOTE) Calculated using the CKD-EPI Creatinine Equation (2021)    Anion gap 13 5 - 15    Comment: Performed at [facility] [place], [place] [address], [place], [place] [address]  CBC     Status: Abnormal   Collection Time: 06/09/20  3:15 AM  Result Value Ref Range   WBC 3.5 (L) 4.0 - 10.5 K/uL   RBC 3.25 (L) 3.87 - 5.11 MIL/uL   Hemoglobin 10.1 (L) 12.0 - 15.0 g/dL   HCT 28.2 (L) 36.0 - 46.0 %   MCV 86.8 80.0 - 100.0 fL   MCH 31.1 26.0 - 34.0  pg   MCHC 35.8 30.0 - 36.0 g/dL   RDW 18.3 (H) 11.5 - 15.5 %   Platelets 21 (LL) 150 - 400 K/uL    Comment: SPECIMEN CHECKED FOR CLOTS Immature Platelet Fraction may be clinically indicated, consider ordering this additional test [id number] CRITICAL VALUE NOTED.  VALUE IS CONSISTENT WITH PREVIOUSLY REPORTED AND CALLED VALUE. REPEATED TO VERIFY    nRBC 0.0 0.0 - 0.2 %    Comment: Performed at [facility], [place] [address] [place], [place] [address]  Protime-INR     Status: None   Collection Time: 06/09/20  3:15 AM  Result Value Ref Range   Prothrombin Time 14.8 11.4 - 15.2 seconds   INR 1.2 0.8 - 1.2    Comment: (NOTE) INR goal varies based on device and disease states. Performed at [facility], [place] [address] [place], [place] [address]   Magnesium     Status: Abnormal   Collection Time: 06/09/20  3:15 AM  Result Value Ref Range   Magnesium 1.1 (L) 1.7 - 2.4 mg/dL    Comment: Performed at [facility], [place] [address] [place], [place] [address]  Phosphorus     Status: Abnormal   Collection Time: 06/09/20  3:15 AM  Result Value Ref Range   Phosphorus 1.0 (LL) 2.5 - 4.6 mg/dL    Comment: CRITICAL RESULT CALLED TO, READ BACK BY AND VERIFIED WITH: K,HINSON AT 0403 ON 06/09/20 BY A,MOHAMED Performed at [facility], [place] [address], [place], [place] [address]   Ammonia     Status: Abnormal   Collection Time: 06/09/20  3:15 AM  Result Value Ref Range   Ammonia 54 (H) 9 - 35 umol/L    Comment: Performed at [facility], [place] [address], [place], [place] [address]  Glucose, capillary     Status: Abnormal   Collection Time: 06/09/20  8:25 AM  Result Value Ref Range   Glucose-Capillary 207 (H) 70 - 99 mg/dL    Comment: Glucose reference range applies only to samples taken after fasting for at least 8 hours.  Type and screen [place]     Status: None   Collection Time:  06/09/20  9:26 AM  Result Value Ref Range   ABO/RH(D) B POS    Antibody Screen NEG    Sample Expiration      06/12/2020,2359 Performed at [facility], [place] [address], [place], [place] [address]   Prepare Pheresed Platelets     Status: None (Preliminary result)   Collection Time: 06/09/20  9:26 AM  Result Value Ref Range   Unit Number [id number]    Blood Component Type PLTP1 PSORALEN TREATED    Unit division 00    Status  of Unit ISSUED    Transfusion Status OK TO TRANSFUSE    Unit Number [id number]    Blood Component Type PSORALEN TREATED    Unit division 00    Status of Unit ISSUED    Transfusion Status      OK TO TRANSFUSE Performed at [facility], [address] [place], [place] [address]   Comprehensive metabolic panel     Status: Abnormal   Collection Time: 06/10/20 12:32 AM  Result Value Ref Range   Sodium 138 135 - 145 mmol/L    Comment: DELTA CHECK NOTED   Potassium 2.9 (L) 3.5 - 5.1 mmol/L    Comment: DELTA CHECK NOTED   Chloride 96 (L) 98 - 111 mmol/L   CO2 27 22 - 32 mmol/L   Glucose, Bld 167 (H) 70 - 99 mg/dL    Comment: Glucose reference range applies only to samples taken after fasting for at least 8 hours.   BUN <5 (L) 6 - 20 mg/dL   Creatinine, Ser 7.82 0.44 - 1.00 mg/dL   Calcium 6.8 (L) 8.9 - 10.3 mg/dL   Total Protein 6.2 (L) 6.5 - 8.1 g/dL   Albumin 2.6 (L) 3.5 - 5.0 g/dL   AST 956 (H) 15 - 41 U/L   ALT 39 0 - 44 U/L   Alkaline Phosphatase 150 (H) 38 - 126 U/L   Total Bilirubin 3.2 (H) 0.3 - 1.2 mg/dL   GFR, Estimated >21 >30 mL/min    Comment: (NOTE) Calculated using the CKD-EPI Creatinine Equation (2021)    Anion gap 15 5 - 15    Comment: Performed at [facility] [address] [place], [place] [address]  Magnesium     Status: Abnormal   Collection Time: 06/10/20 12:32 AM  Result Value Ref Range   Magnesium 1.6 (L) 1.7 - 2.4 mg/dL    Comment: Performed at [facility], [address] [place], [place] [address]  Phosphorus     Status: Abnormal   Collection Time: 06/10/20 12:32 AM  Result Value Ref Range   Phosphorus 1.6 (L) 2.5 - 4.6 mg/dL    Comment: Performed at [facility], [address], [place], [place] [address]  Ammonia     Status: Abnormal   Collection Time: 06/10/20 12:32 AM  Result Value Ref Range   Ammonia 39 (H) 9 - 35 umol/L    Comment: Performed at [facility], [address] [place], [place] [address]  CBC     Status: Abnormal   Collection Time: 06/10/20  4:12 AM  Result Value Ref Range   WBC 4.2 4.0 - 10.5 K/uL   RBC 2.84 (L) 3.87 - 5.11 MIL/uL   Hemoglobin 8.8 (L) 12.0 - 15.0 g/dL   HCT 01.0 (L) 27.2 - 53.6 %   MCV 91.5 80.0 - 100.0 fL   MCH 31.0 26.0 - 34.0 pg   MCHC 33.8 30.0 - 36.0 g/dL   RDW 64.4 (H) 03.4 - 74.2 %   Platelets 96 (L) 150 - 400 K/uL    Comment: Immature Platelet Fraction may be clinically indicated, consider ordering this additional test [id number] POST TRANSFUSION SPECIMEN SPECIMEN RECOLLECTED TO CONFIRM RESULTS    nRBC 0.0 0.0 - 0.2 %    Comment: Performed at [facility], [address] [place], [place] [address]  Glucose, capillary     Status: Abnormal   Collection Time: 06/10/20  7:33 AM  Result Value  Ref Range   Glucose-Capillary 152 (H) 70 - 99 mg/dL    Comment: Glucose reference range applies only to samples taken after fasting for at least 8 hours.    Studies/Results: No results found.  Medications: I have reviewed the patient's current medications.  Assessment: Acute alcoholic hepatitis without hepatic encephalopathy or coagulopathy Severe thrombocytopenia likely related to chronic liver disease, possible underlying cirrhosis Severe electrolyte imbalance-low potassium, low calcium, low phosphorus, low magnesium  Primary team has been supplementing electrolytes as needed Patient able to tolerate soft  diet No signs of alcohol withdrawal  Plan: Monitor for refeeding syndrome. Continue supportive management, folic acid, thiamine, multivitamin, IV Ativan as needed. Most importantly patient needs to stop drinking alcohol. Patient believes she is being discharged tomorrow, discussed with patient that it may not be possible if electrolytes are not corrected adequately.    Ronnette Juniper, MD 06/10/2020, 3:07 PM

## 2020-06-11 NOTE — Progress Notes (Signed)
Cross Cover Informed patient left against medical advice. Per nursing report, patient alert and oriented x 4 and had actually told the staff she was leaving in the morning last night.  Defer to nursing note for further details

## 2020-06-11 NOTE — Progress Notes (Signed)
Pt refusing tele, iv, fluids and bed alarm wants to leave ama. Aox4, charge and physician notified.

## 2020-06-11 NOTE — Progress Notes (Signed)
Pt in room washing up and getting dressed. Pt states she will wait until morning to leave but is refusing medical help. Tried to assist pt in bathing/dressing/ pt refuses my help and the cna.

## 2020-06-11 NOTE — Progress Notes (Signed)
Pt refused AM labs and demanded to leave AMA. CIWA negative , aox4. Pt left with all valuables with her and was wheeled down in wheel chair to ED and helped by myself into the cab that she called for from her cell phone. Charge, Financial risk analyst and physician notified.

## 2020-06-11 NOTE — Progress Notes (Signed)
Per ED note last drink was 1 day before arrival. Arrived in AM on 5/5, so presume last drink on the 4th.  Pt states she has family she wants to see on the holiday and that's why she wants to leave. I told her they can come see her but then pt stated she needed to pay her rent. Pt states she is going to leave but she states she will probably return because she likes here but she has things she needs to go do.

## 2020-06-11 NOTE — Progress Notes (Signed)
Pt refusing help with getting washed up and dressed

## 2020-06-12 LAB — PREPARE PLATELET PHERESIS
Unit division: 0
Unit division: 0

## 2020-06-12 LAB — BPAM PLATELET PHERESIS
Blood Product Expiration Date: 202205072359
Blood Product Expiration Date: 202205092359
ISSUE DATE / TIME: 202205061248
ISSUE DATE / TIME: 202205061454
Unit Type and Rh: 5100
Unit Type and Rh: 9500

## 2020-06-20 ENCOUNTER — Other Ambulatory Visit: Payer: Self-pay

## 2020-06-20 ENCOUNTER — Emergency Department (HOSPITAL_COMMUNITY)
Admission: EM | Admit: 2020-06-20 | Discharge: 2020-06-20 | Disposition: A | Discharge status: Home / Self Care | Payer: Medicaid Other | Attending: Emergency Medicine | Admitting: Emergency Medicine

## 2020-06-20 ENCOUNTER — Encounter (HOSPITAL_COMMUNITY): Payer: Self-pay

## 2020-06-20 DIAGNOSIS — K769 Liver disease, unspecified: Secondary | ICD-10-CM

## 2020-06-20 DIAGNOSIS — R1011 Right upper quadrant pain: Secondary | ICD-10-CM

## 2020-06-20 DIAGNOSIS — Z79899 Other long term (current) drug therapy: Secondary | ICD-10-CM | POA: Insufficient documentation

## 2020-06-20 DIAGNOSIS — J45909 Unspecified asthma, uncomplicated: Secondary | ICD-10-CM | POA: Insufficient documentation

## 2020-06-20 DIAGNOSIS — K219 Gastro-esophageal reflux disease without esophagitis: Secondary | ICD-10-CM | POA: Insufficient documentation

## 2020-06-20 DIAGNOSIS — I10 Essential (primary) hypertension: Secondary | ICD-10-CM | POA: Diagnosis not present

## 2020-06-20 LAB — CBC WITH DIFFERENTIAL/PLATELET
Abs Immature Granulocytes: 0.03 10*3/uL (ref 0.00–0.07)
Basophils Absolute: 0.1 10*3/uL (ref 0.0–0.1)
Basophils Relative: 1 %
Eosinophils Absolute: 0.1 10*3/uL (ref 0.0–0.5)
Eosinophils Relative: 1 %
HCT: 29.7 % — ABNORMAL LOW (ref 36.0–46.0)
Hemoglobin: 9.3 g/dL — ABNORMAL LOW (ref 12.0–15.0)
Immature Granulocytes: 0 %
Lymphocytes Relative: 30 %
Lymphs Abs: 3 10*3/uL (ref 0.7–4.0)
MCH: 32 pg (ref 26.0–34.0)
MCHC: 31.3 g/dL (ref 30.0–36.0)
MCV: 102.1 fL — ABNORMAL HIGH (ref 80.0–100.0)
Monocytes Absolute: 1.1 10*3/uL — ABNORMAL HIGH (ref 0.1–1.0)
Monocytes Relative: 11 %
Neutro Abs: 5.6 10*3/uL (ref 1.7–7.7)
Neutrophils Relative %: 57 %
Platelets: 345 10*3/uL (ref 150–400)
RBC: 2.91 MIL/uL — ABNORMAL LOW (ref 3.87–5.11)
RDW: 22.7 % — ABNORMAL HIGH (ref 11.5–15.5)
WBC: 9.9 10*3/uL (ref 4.0–10.5)
nRBC: 0 % (ref 0.0–0.2)

## 2020-06-20 LAB — PROTIME-INR
INR: 1.1 (ref 0.8–1.2)
Prothrombin Time: 14.3 seconds (ref 11.4–15.2)

## 2020-06-20 LAB — COMPREHENSIVE METABOLIC PANEL
ALT: 50 U/L — ABNORMAL HIGH (ref 0–44)
AST: 116 U/L — ABNORMAL HIGH (ref 15–41)
Albumin: 3.3 g/dL — ABNORMAL LOW (ref 3.5–5.0)
Alkaline Phosphatase: 170 U/L — ABNORMAL HIGH (ref 38–126)
Anion gap: 7 (ref 5–15)
BUN: 16 mg/dL (ref 6–20)
CO2: 27 mmol/L (ref 22–32)
Calcium: 9.2 mg/dL (ref 8.9–10.3)
Chloride: 104 mmol/L (ref 98–111)
Creatinine, Ser: 0.52 mg/dL (ref 0.44–1.00)
GFR, Estimated: >60 mL/min (ref >60)
Glucose, Bld: 87 mg/dL (ref 70–99)
Potassium: 4 mmol/L (ref 3.5–5.1)
Sodium: 138 mmol/L (ref 135–145)
Total Bilirubin: 1.3 mg/dL — ABNORMAL HIGH (ref 0.3–1.2)
Total Protein: 7.9 g/dL (ref 6.5–8.1)

## 2020-06-20 LAB — LIPASE, BLOOD: Lipase: 20 U/L (ref 11–51)

## 2020-06-20 LAB — MAGNESIUM: Magnesium: 1.6 mg/dL — ABNORMAL LOW (ref 1.7–2.4)

## 2020-06-20 LAB — ETHANOL: Alcohol, Ethyl (B): <10 mg/dL (ref <10)

## 2020-06-20 LAB — PHOSPHORUS: Phosphorus: 4.7 mg/dL — ABNORMAL HIGH (ref 2.5–4.6)

## 2020-06-20 NOTE — ED Triage Notes (Signed)
Per EMS- Patient c/o flank and back pain since yesterday. Patient reported to EMS that she has liver damage, but did not stay when she was seen last and did not receive treatment. Patient is jaundiced.

## 2020-06-20 NOTE — ED Provider Notes (Signed)
[facility] DEPT Provider Note   CSN: [id number] Arrival date & time: 06/20/20  [id number]     History Chief Complaint  Patient presents with  . Back Pain  . Flank Pain    Brandy Bishop is a 60 y.o. female, with a history of chronic liver disease, hypertension, polysubstance abuse/alcohol use disorder, and previous seizures, presenting for evaluation of abdominal pain.   She was admitted on 5/5 due to acute alcoholic hepatitis, however left AMA on 5/8.  Required platelet transfusion during admission due to platelets 21.  Evaluated by GI during her stay, no procedures planned at that time.  Today, she presents for acute right upper quadrant abdominal pain since yesterday that has not increased in severity since onset.  Throbbing in nature, intermittent.  Reports it does not appear to be related to food, comes on randomly and will last for a few minutes.  Denies any associated nausea, vomiting, dysuria, change in BM, leg swelling, CP, SOB, hematochezia/melena, lightheadedness/dizziness.  The pain is minimally bothersome to her, however she want to make sure everything was okay since she knows that she left the hospital early from her recent admission.  Denies any alcohol use since admission on 5/5.  Eating and drinking as normal, states she has a great appetite.    Past Medical History:  Diagnosis Date  . Alcoholism ([place]) 2011  . Asthma   . Chronic lower back pain   . Colitis 04/2014.   Bloody diarrhea  . Daily headache   . GIB (gastrointestinal bleeding)   . Hypertension   . Pancreatitis    Chronic pancreatitis noted on CT scan in 04/2014.  Marland Kitchen Polysubstance abuse ([place]) 2011  . Pyelonephritis 05/2012  . Seizures ([place])    last seizure was 04/2019 per patient     Patient Active Problem List   Diagnosis Date Noted  . Acute liver failure 06/08/2020  . Acute GI bleeding 09/08/2019  . Polysubstance abuse ([place]) 09/08/2019  . Gastroesophageal reflux disease  with esophagitis   . Seizure ([place]) 05/30/2015  . Cough   . Subtherapeutic phenytoin level   . Acute upper GI bleed   . Hypomagnesemia 04/16/2014  . Hypokalemia 04/16/2014  . Thrombocytopenia ([place]) 04/16/2014  . Hepatic steatosis 04/15/2014  . Alcohol abuse 04/15/2014  . Chronic pancreatitis ([place]) 04/15/2014  . Protein-calorie malnutrition, severe ([place]) 04/15/2014  . Severe protein-calorie malnutrition ([place]) 04/15/2014  . Bleeding gastrointestinal   . Abnormal CT scan, colon   . Rectal bleeding   . Abdominal pain   . Alcoholic hepatitis without ascites   . GI bleed 04/14/2014  . Viral URI with cough 05/07/2012  . Pyelonephritis 05/07/2012  . Seizure disorder ([place]) 05/07/2012  . Asthma with bronchitis 05/07/2012  . HTN (hypertension) 05/07/2012    Past Surgical History:  Procedure Laterality Date  . COLONOSCOPY WITH PROPOFOL N/A 09/09/2019   Procedure: COLONOSCOPY WITH PROPOFOL;  Surgeon: Wilford Corner, MD;  Location: [facility];  Service: Endoscopy;  Laterality: N/A;  . ESOPHAGOGASTRODUODENOSCOPY N/A 05/31/2015   Procedure: ESOPHAGOGASTRODUODENOSCOPY (EGD);  Surgeon: Irene Shipper, MD;  Location: [facility]COPY;  Service: Endoscopy;  Laterality: N/A;  . ESOPHAGOGASTRODUODENOSCOPY N/A 09/09/2019   Procedure: ESOPHAGOGASTRODUODENOSCOPY (EGD);  Surgeon: Wilford Corner, MD;  Location: [facility];  Service: Endoscopy;  Laterality: N/A;  . NO PAST SURGERIES    . ORIF HUMERUS FRACTURE Right 05/12/2019   Procedure: OPEN REDUCTION INTERNAL FIXATION (ORIF) DISTAL HUMERUS FRACTURE;  Surgeon: Hiram Gash, MD;  Location: [facility];  Service: Orthopedics;  Laterality: Right;     OB History   No obstetric history on file.     Family History  Problem Relation Age of Onset  . Cancer Mother   . Cancer Brother     Social History   Tobacco Use  . Smoking status: Never Smoker  . Smokeless tobacco: Never Used  Vaping Use  . Vaping Use: Never used  Substance Use Topics  . Alcohol  use: Not Currently    Alcohol/week: 10.0 standard drinks    Types: 10 Cans of beer per week    Comment: quit   . Drug use: No    Home Medications Prior to Admission medications   Medication Sig Start Date End Date Taking? Authorizing Provider  amLODipine (NORVASC) 10 MG tablet Take 10 mg by mouth daily. 08/13/19   [provider]  cetirizine (ZYRTEC) 10 MG tablet Take 10 mg by mouth daily. 12/28/19   [provider]  folic acid (FOLVITE) 1 MG tablet Take 1 tablet (1 mg total) by mouth daily. Patient not taking: Reported on 06/08/2020 09/10/19   Nolberto Hanlon, MD  hydrochlorothiazide (HYDRODIURIL) 25 MG tablet Take 25 mg by mouth daily. 12/28/19   [provider]  omeprazole (PRILOSEC) 20 MG capsule Take 20 mg by mouth daily. 10/28/19   [provider]  thiamine 100 MG tablet Take 1 tablet (100 mg total) by mouth daily. Patient not taking: Reported on 06/08/2020 09/10/19   Nolberto Hanlon, MD  traZODone (DESYREL) 50 MG tablet Take 50 mg by mouth at bedtime. 12/28/19   [provider]  triamcinolone ointment (KENALOG) 0.1 % Apply 1 application topically 2 (two) times daily as needed (rash).  05/20/19   [provider]    Allergies    Penicillins  Review of Systems   Review of Systems  Respiratory: Negative for cough and shortness of breath.   Cardiovascular: Negative for chest pain and leg swelling.  Gastrointestinal: Positive for abdominal pain. Negative for abdominal distention, blood in stool, constipation, diarrhea, nausea and vomiting.  Genitourinary: Negative for difficulty urinating, dysuria and flank pain.  Musculoskeletal: Negative for gait problem.  Skin: Negative for rash.  Neurological: Negative for dizziness, tremors, weakness and light-headedness.    Physical Exam Updated Vital Signs BP (!) 171/103   Pulse (!) 112   Temp 98.3 F (36.8 C) (Oral)   Resp 19   Ht 5\' 3"  (1.6 m)   Wt 42.2 kg   SpO2 96%   BMI 16.47 kg/m    Physical Exam Constitutional:      General: She is not in acute distress.    Appearance: Normal appearance. She is not ill-appearing.  HENT:     Head: Normocephalic and atraumatic.     Mouth/Throat:     Mouth: Mucous membranes are moist.  Eyes:     General:        Right eye: No discharge.        Left eye: No discharge.     Extraocular Movements: Extraocular movements intact.     Comments: Brown pigment within conjunctiva bilaterally   Cardiovascular:     Rate and Rhythm: Normal rate.     Pulses: Normal pulses.  Pulmonary:     Effort: Pulmonary effort is normal.     Breath sounds: Normal breath sounds.  Abdominal:     General: Bowel sounds are normal.     Palpations: Abdomen is soft.     Comments: Right upper quadrant and epigastric tenderness  without rebounding or guarding.  Negative Murphy sign.  No CVA tenderness bilaterally.  No fluid wave palpated.  Musculoskeletal:     Cervical back: Neck supple.     Right lower leg: No edema.     Left lower leg: No edema.  Skin:    General: Skin is warm and dry.     Findings: No rash.  Neurological:     Mental Status: She is alert and oriented to person, place, and time.     ED Results / Procedures / Treatments   Labs (all labs ordered are listed, but only abnormal results are displayed) Labs Reviewed  COMPREHENSIVE METABOLIC PANEL - Abnormal; Notable for the following components:      Result Value   Albumin 3.3 (*)    AST 116 (*)    ALT 50 (*)    Alkaline Phosphatase 170 (*)    Total Bilirubin 1.3 (*)    All other components within normal limits  CBC WITH DIFFERENTIAL/PLATELET - Abnormal; Notable for the following components:   RBC 2.91 (*)    Hemoglobin 9.3 (*)    HCT 29.7 (*)    MCV 102.1 (*)    RDW 22.7 (*)    Monocytes Absolute 1.1 (*)    All other components within normal limits  MAGNESIUM - Abnormal; Notable for the following components:   Magnesium 1.6 (*)    All other components within normal limits   PHOSPHORUS - Abnormal; Notable for the following components:   Phosphorus 4.7 (*)    All other components within normal limits  ETHANOL  LIPASE, BLOOD  PROTIME-INR    EKG None  Radiology No results found.  Procedures Procedures   Medications Ordered in ED Medications - No data to display  ED Course  I have reviewed the triage vital signs and the nursing notes.  Pertinent labs & imaging results that were available during my care of the patient were reviewed by me and considered in my medical decision making (see chart for details).    MDM Rules/Calculators/A&P                          61 year old female, with a history of chronic liver disease, presenting for evaluation of RUQ abdominal pain in the setting of recent acute alcoholic hepatitis.  Afebrile and hemodynamically stable, well-appearing with benign abdomen.  Reviewed recent CT abdomen and RUQ U/S on 5/5 with increased liver echogenicity consistent with her liver disease however no large gallbladder stones identified.  Doubt cholecystitis with a negative Murphy sign and afebrile. Will evaluate for possible worsening liver function vs recovery from recent hepatitis, will obtain CBC, CMP, PT/INR, mag, Phos, ethanol level. Will consider imaging pending labs.   Overall labs reassuring against acute/recurrent hepatic or gallbladder pathology.  Ethanol level <10.  LFTs overall downtrending, bilirubin down trended to 1.3 from previous 3-4.  Electrolytes WNL (with exception of Mag 1.6).  INR and platelets normal.  Hemoglobin around baseline without signs of bleeding. On reassessment, patient is comfortable with no current abdominal pain.  Provided reassurance and recommended follow-up with PCP/GI.  Encouraged adequate hydration and continued abstinence from alcohol.  ED precautions strictly discussed.  Final Clinical Impression(s) / ED Diagnoses Final diagnoses:  Right upper quadrant abdominal pain  Chronic liver disease    Rx  / DC Orders ED Discharge Orders    None       Patriciaann Clan, DO 06/20/20 1153    Dykstra,  Ellwood Dense, MD 06/21/20 2154

## 2020-06-20 NOTE — Discharge Instructions (Addendum)
It was wonderful to see you today.  Congratulations on the great work that you have been doing at home!  Please follow-up with your primary care provider and likely see a GI specialist.  If your abdominal pain becomes more persistent/severe, recurrent nausea/vomiting, or unable to maintain your hydration please be seen.

## 2020-06-22 NOTE — Discharge Summary (Signed)
Physician Discharge Summary  Brandy Bishop HWE:[id number] DOB: 1960/07/11 DOA: 06/08/2020  PCP: Eston Esters, NP  Admit date: 06/08/2020 Discharge date: 06/11/20 Admitted From: Home Disposition:  Signed AMA   Brief/Interim Summary: Patient signed AMA on the early morning of 06/11/20.  Patient had already left before my shift started.  Please see my progress note from 06/10/2020 for further details.  Discharge Diagnoses:  Principal Problem:   Acute liver failure Active Problems:   Seizure disorder (HCC)   HTN (hypertension)   Alcohol abuse   Chronic pancreatitis (HCC)   Protein-calorie malnutrition, severe (HCC)   Alcoholic hepatitis without ascites   Hypomagnesemia   Thrombocytopenia (HCC)   Polysubstance abuse ([place])    Discharge Instructions   Allergies as of 06/11/2020      Reactions   Penicillins Swelling   .Has patient had a PCN reaction causing immediate rash, facial/tongue/throat swelling, SOB or lightheadedness with hypotension: Yes Has patient had a PCN reaction causing severe rash involving mucus membranes or skin necrosis: No Has patient had a PCN reaction that required hospitalization No Has patient had a PCN reaction occurring within the last 10 years: No If all of the above answers are "NO", then may proceed with Cephalosporin use.      Medication List    ASK your doctor about these medications   amLODipine 10 MG tablet Commonly known as: NORVASC Take 10 mg by mouth daily.   cetirizine 10 MG tablet Commonly known as: ZYRTEC Take 10 mg by mouth daily.   folic acid 1 MG tablet Commonly known as: FOLVITE Take 1 tablet (1 mg total) by mouth daily.   hydrochlorothiazide 25 MG tablet Commonly known as: HYDRODIURIL Take 25 mg by mouth daily.   omeprazole 20 MG capsule Commonly known as: PRILOSEC Take 20 mg by mouth daily. Ask about: Which instructions should I use?   thiamine 100 MG tablet Take 1 tablet (100 mg total) by mouth daily.    traZODone 50 MG tablet Commonly known as: DESYREL Take 50 mg by mouth at bedtime.   triamcinolone ointment 0.1 % Commonly known as: KENALOG Apply 1 application topically 2 (two) times daily as needed (rash).       Allergies  Allergen Reactions  . Penicillins Swelling    .Has patient had a PCN reaction causing immediate rash, facial/tongue/throat swelling, SOB or lightheadedness with hypotension: Yes Has patient had a PCN reaction causing severe rash involving mucus membranes or skin necrosis: No Has patient had a PCN reaction that required hospitalization No Has patient had a PCN reaction occurring within the last 10 years: No If all of the above answers are "NO", then may proceed with Cephalosporin use.     Consultations:  GI   Procedures/Studies: CT Abdomen Pelvis W Contrast  Result Date: 06/08/2020 CLINICAL DATA:  Right lower quadrant abdominal pain x4 days, nausea vomiting constipation EXAM: CT ABDOMEN AND PELVIS WITH CONTRAST TECHNIQUE: Multidetector CT imaging of the abdomen and pelvis was performed using the standard protocol following bolus administration of intravenous contrast. CONTRAST:  160mL OMNIPAQUE IOHEXOL 300 MG/ML  SOLN COMPARISON:  Same day abdominal ultrasound and CT February 25, 2015 FINDINGS: Lower chest: Bibasilar atelectasis. Hepatobiliary: Diffusely decreased attenuation of the hepatic parenchyma. No suspicious hepatic lesion. No biliary ductal dilation. Pancreas: Similar pancreatic atrophy with mild ductal dilation in the tail and dystrophic calcifications head/neck. No evidence of acute pancreatic inflammation. Spleen: Within normal limits. Adrenals/Urinary Tract: Bilateral adrenal glands within normal limits. No hydronephrosis. No solid enhancing renal  masses. Urinary a bladder is within normal limits. Stomach/Bowel: There is mild gastric wall thickening of the antrum. No pathologically dilated loops of small bowel. Small bowel diverticula without  diverticulitis. The appendix appears grossly unremarkable. Diffuse colonic diverticulosis. There is low-density wall thickening involving the terminal ileum, cecum and ascending colon which extends to a lesser extent throughout the descending colon and into the sigmoid. Without significant adjacent inflammatory stranding. Vascular/Lymphatic: Aortic atherosclerosis without aneurysmal dilation. No e pathologically enlarged abdominal or pelvic lymph nodes. Reproductive: Uterus and bilateral adnexa are unremarkable. Other: No significant abdominopelvic ascites. There is mild nonspecific subcutaneous stranding. Musculoskeletal: No acute or significant osseous findings. Multilevel degenerative changes spine. No acute osseous abnormality. Calcific density in the subcutaneous tissues overlying the left gluteal musculature likely sequela of prior trauma or injection. IMPRESSION: 1. Low-density wall thickening involving the terminal ileum, cecum and ascending colon extending to a lesser extent throughout the descending colon and into the sigmoid. Without significant adjacent inflammatory stranding. Findings are nonspecific and may represent an infectious or inflammatory enterocolitis or be sequela of hypoalbuminemia. 2. Mild gastric wall thickening of the antrum, which may represent gastritis or represent sequela of hypoalbuminemia. 3. Small bowel diverticula and diffuse colonic diverticulosis without evidence of diverticulitis. 4. Hepatic steatosis. 5. Aortic atherosclerosis. Aortic Atherosclerosis (ICD10-I70.0). Electronically Signed   By: Dahlia Bailiff MD   On: 06/08/2020 11:00   US Abdomen Limited RUQ (LIVER/GB)  Result Date: 06/08/2020 CLINICAL DATA:  Elevated LFTs EXAM: ULTRASOUND ABDOMEN LIMITED RIGHT UPPER QUADRANT COMPARISON:  CT abdomen pelvis 02/25/2015 FINDINGS: Gallbladder: Nondilated. There is a small amount of intraluminal sludge with no large stones identified. There is no significant wall thickening. No  sonographic Murphy sign noted by sonographer. Common bile duct: Diameter: 2.7 mm Liver: Diffusely increased liver echogenicity with coarse echotexture. No focal lesion identified on this limited exam. Portal vein is patent on color Doppler imaging with normal direction of blood flow towards the liver. Other: None. IMPRESSION: Increased liver echogenicity with coarse echotexture compatible with chronic liver disease with element of steatosis. No evidence of cholecystitis. Electronically Signed   By: Maurine Simmering   On: 06/08/2020 09:32          Discharge Exam: Vitals:   06/10/20 1341 06/10/20 2010  BP: (!) 151/95 (!) 149/100  Pulse: 95 99  Resp: 17 (!) 22  Temp: 98.9 F (37.2 C) 98.9 F (37.2 C)  SpO2: 100% 100%   Vitals:   06/10/20 0441 06/10/20 0600 06/10/20 1341 06/10/20 2010  BP: 110/73 110/73 (!) 151/95 (!) 149/100  Pulse:   95 99  Resp: 18  17 (!) 22  Temp: 99.8 F (37.7 C)  98.9 F (37.2 C) 98.9 F (37.2 C)  TempSrc: Oral  Oral Oral  SpO2:   100% 100%  Height:          The results of significant diagnostics from this hospitalization (including imaging, microbiology, ancillary and laboratory) are listed below for reference.     Microbiology: No results found for this or any previous visit (from the past 240 hour(s)).   Labs: BNP (last 3 results) No results for input(s): BNP in the last 8760 hours. Basic Metabolic Panel: Recent Labs  Lab 06/20/20 1023  NA 138  K 4.0  CL 104  CO2 27  GLUCOSE 87  BUN 16  CREATININE 0.52  CALCIUM 9.2  MG 1.6*  PHOS 4.7*   Liver Function Tests: Recent Labs  Lab 06/20/20 1023  AST 116*  ALT 50*  ALKPHOS 170*  BILITOT 1.3*  PROT 7.9  ALBUMIN 3.3*   Recent Labs  Lab 06/20/20 1023  LIPASE 20   No results for input(s): AMMONIA in the last 168 hours. CBC: Recent Labs  Lab 06/20/20 1023  WBC 9.9  NEUTROABS 5.6  HGB 9.3*  HCT 29.7*  MCV 102.1*  PLT 345   Cardiac Enzymes: No results for input(s): CKTOTAL,  CKMB, CKMBINDEX, TROPONINI in the last 168 hours. BNP: Invalid input(s): POCBNP CBG: No results for input(s): GLUCAP in the last 168 hours. D-Dimer No results for input(s): DDIMER in the last 72 hours. Hgb A1c No results for input(s): HGBA1C in the last 72 hours. Lipid Profile No results for input(s): CHOL, HDL, LDLCALC, TRIG, CHOLHDL, LDLDIRECT in the last 72 hours. Thyroid function studies No results for input(s): TSH, T4TOTAL, T3FREE, THYROIDAB in the last 72 hours.  Invalid input(s): FREET3 Anemia work up No results for input(s): VITAMINB12, FOLATE, FERRITIN, TIBC, IRON, RETICCTPCT in the last 72 hours. Urinalysis    Component Value Date/Time   COLORURINE YELLOW 06/08/2020 1023   APPEARANCEUR CLEAR 06/08/2020 1023   LABSPEC 1.012 06/08/2020 1023   PHURINE 6.0 06/08/2020 1023   GLUCOSEU NEGATIVE 06/08/2020 1023   HGBUR SMALL (A) 06/08/2020 [place] 06/08/2020 1023   KETONESUR 20 (A) 06/08/2020 1023   PROTEINUR NEGATIVE 06/08/2020 1023   UROBILINOGEN 0.2 04/14/2014 1419   NITRITE NEGATIVE 06/08/2020 [place] 06/08/2020 1023   Sepsis Labs Invalid input(s): PROCALCITONIN,  WBC,  LACTICIDVEN Microbiology No results found for this or any previous visit (from the past 240 hour(s)).  Please note: You were cared for by a hospitalist during your hospital stay. Once you are discharged, your primary care physician will handle any further medical issues. Please note that NO REFILLS for any discharge medications will be authorized once you are discharged, as it is imperative that you return to your primary care physician (or establish a relationship with a primary care physician if you do not have one) for your post hospital discharge needs so that they can reassess your need for medications and monitor your lab values.    Time coordinating discharge: 40 minutes  SIGNED:   Shelly Coss, MD  [facility] 06/22/2020, 1:20 PM Pager  [phone]  If 7PM-7AM, please contact night-coverage [email or web address] Password [email or web address]

## 2020-07-12 ENCOUNTER — Encounter: Payer: Self-pay | Admitting: Gastroenterology

## 2020-08-10 ENCOUNTER — Other Ambulatory Visit: Payer: Self-pay

## 2020-08-10 ENCOUNTER — Encounter: Payer: Self-pay | Admitting: Gastroenterology

## 2020-08-10 ENCOUNTER — Other Ambulatory Visit (INDEPENDENT_AMBULATORY_CARE_PROVIDER_SITE_OTHER): Payer: Medicaid Other

## 2020-08-10 ENCOUNTER — Ambulatory Visit (INDEPENDENT_AMBULATORY_CARE_PROVIDER_SITE_OTHER): Payer: Medicaid Other | Admitting: Gastroenterology

## 2020-08-10 VITALS — BP 110/70 | HR 73 | Ht 63.0 in | Wt 112.0 lb

## 2020-08-10 DIAGNOSIS — K769 Liver disease, unspecified: Secondary | ICD-10-CM

## 2020-08-10 DIAGNOSIS — F1010 Alcohol abuse, uncomplicated: Secondary | ICD-10-CM | POA: Diagnosis not present

## 2020-08-10 DIAGNOSIS — Z1211 Encounter for screening for malignant neoplasm of colon: Secondary | ICD-10-CM | POA: Diagnosis not present

## 2020-08-10 LAB — CBC WITH DIFFERENTIAL/PLATELET
Basophils Absolute: 0 10*3/uL (ref 0.0–0.1)
Basophils Relative: 0.9 % (ref 0.0–3.0)
Eosinophils Absolute: 0.1 10*3/uL (ref 0.0–0.7)
Eosinophils Relative: 2.1 % (ref 0.0–5.0)
HCT: 37.7 % (ref 36.0–46.0)
Hemoglobin: 12.6 g/dL (ref 12.0–15.0)
Lymphocytes Relative: 43.2 % (ref 12.0–46.0)
Lymphs Abs: 2.2 10*3/uL (ref 0.7–4.0)
MCHC: 33.5 g/dL (ref 30.0–36.0)
MCV: 93.5 fl (ref 78.0–100.0)
Monocytes Absolute: 0.4 10*3/uL (ref 0.1–1.0)
Monocytes Relative: 8.1 % (ref 3.0–12.0)
Neutro Abs: 2.3 10*3/uL (ref 1.4–7.7)
Neutrophils Relative %: 45.7 % (ref 43.0–77.0)
Platelets: 238 10*3/uL (ref 150.0–400.0)
RBC: 4.03 Mil/uL (ref 3.87–5.11)
RDW: 14 % (ref 11.5–15.5)
WBC: 5 10*3/uL (ref 4.0–10.5)

## 2020-08-10 LAB — COMPREHENSIVE METABOLIC PANEL
ALT: 19 U/L (ref 0–35)
AST: 36 U/L (ref 0–37)
Albumin: 4 g/dL (ref 3.5–5.2)
Alkaline Phosphatase: 99 U/L (ref 39–117)
BUN: 11 mg/dL (ref 6–23)
CO2: 24 mEq/L (ref 19–32)
Calcium: 9.7 mg/dL (ref 8.4–10.5)
Chloride: 102 mEq/L (ref 96–112)
Creatinine, Ser: 0.61 mg/dL (ref 0.40–1.20)
GFR: 97.89 mL/min (ref >60.00)
Glucose, Bld: 113 mg/dL — ABNORMAL HIGH (ref 70–99)
Potassium: 3.6 mEq/L (ref 3.5–5.1)
Sodium: 138 mEq/L (ref 135–145)
Total Bilirubin: 0.8 mg/dL (ref 0.2–1.2)
Total Protein: 8.2 g/dL (ref 6.0–8.3)

## 2020-08-10 LAB — PROTIME-INR
INR: 1.2 ratio — ABNORMAL HIGH (ref 0.8–1.0)
Prothrombin Time: 13.2 s — ABNORMAL HIGH (ref 9.6–13.1)

## 2020-08-10 MED ORDER — NA SULFATE-K SULFATE-MG SULF 17.5-3.13-1.6 GM/177ML PO SOLN: 1.0000 | Freq: Once | ORAL | 0 refills | Status: AC

## 2020-08-10 NOTE — Patient Instructions (Addendum)
Your provider has requested that you go to the basement level for lab work before leaving today. Press "B" on the elevator. The lab is located at the first door on the left as you exit the elevator.  You have been scheduled for an endoscopy and colonoscopy. Please follow the written instructions given to you at your visit today. Please pick up your prep supplies at the pharmacy within the next 1-3 days. If you use inhalers (even only as needed), please bring them with you on the day of your procedure.   If you are age 67 or older, your body mass index should be between 23-30. Your Body mass index is 19.84 kg/m. If this is out of the aforementioned range listed, please consider follow up with your Primary Care Provider.  If you are age 2 or younger, your body mass index should be between 19-25. Your Body mass index is 19.84 kg/m. If this is out of the aformentioned range listed, please consider follow up with your Primary Care Provider.   __________________________________________________________  The [facility] providers would like to encourage you to use [facility] to communicate with providers for non-urgent requests or questions.  Due to long hold times on the telephone, sending your provider a message by [facility] may be a faster and more efficient way to get a response.  Please allow 48 business hours for a response.  Please remember that this is for non-urgent requests.

## 2020-08-10 NOTE — Progress Notes (Signed)
08/10/2020 Brandy Bishop [id number] 1960-10-08   HISTORY OF PRESENT ILLNESS:  This is a 60 year old female who has past medical history of alcoholism, chronic low back pain, hypertension, chronic pancreatitis/atrophic pancreas seen on previous CT scan, history of substance abuse, history of seizures with last seizure in March 2021.  She is known to our service for a couple of different hospital admissions in 2016 and 2017.  Initial consult performed by Dr. Hilarie Fredrickson.  Most recently in May of this year she was seen by [facility].  She is now establishing follow-up here in our office.  Will be assigned to Dr. Hilarie Fredrickson.  As stated above, she has a history of alcohol abuse and alcohol-related liver disease.  Recent ultrasound showed the following:  IMPRESSION: Increased liver echogenicity with coarse echotexture compatible with chronic liver disease with element of steatosis.   No evidence of cholecystitis.  CT scan of the abdomen and pelvis with contrast showed the following:   IMPRESSION: 1. Low-density wall thickening involving the terminal ileum, cecum and ascending colon extending to a lesser extent throughout the descending colon and into the sigmoid. Without significant adjacent inflammatory stranding. Findings are nonspecific and may represent an infectious or inflammatory enterocolitis or be sequela of hypoalbuminemia. 2. Mild gastric wall thickening of the antrum, which may represent gastritis or represent sequela of hypoalbuminemia. 3. Small bowel diverticula and diffuse colonic diverticulosis without evidence of diverticulitis. 4. Hepatic steatosis. 5. Aortic atherosclerosis.   Aortic Atherosclerosis (ICD10-I70.0).  Colonoscopy with Dr. Michail Sermon in 09/2019:  - Preparation of the colon was unsatisfactory. - Diverticulosis in the entire examined colon. - One 4 mm polyp in the cecum. Resection not attempted. - Internal hemorrhoids. - No specimens collected.  EGD by Dr.  Michail Sermon in 09/2019:  - Normal esophagus. - Z-line regular. - Acute gastritis. - Normal examined duodenum.  She tells me that she is no longer drinking liquor.  Does drink 1 or 2 beers on occasion to help with her anxiety.  She tells me that she had two on July 4th and had one yesterday.  She says that she does plan to discontinue it altogether.  She really does not have any GI complaints today.  Her most recent labs in May looked good.  Her INR is normal as well as her platelet count, which had previously been low.  Acute viral hepatitis panel is negative.  Total bili was down to 1.3.  Alk phos was 170, ALT 50, AST 116.  Albumin was 3.3.  Referred here on this occasion by Lily Peer, FNP, for chronic liver disease.   Past Medical History:  Diagnosis Date   Alcoholism ([place]) 2011   Asthma    Chronic lower back pain    Colitis 04/2014.   Bloody diarrhea   Daily headache    GIB (gastrointestinal bleeding)    Hypertension    Pancreatitis    Chronic pancreatitis noted on CT scan in 04/2014.   Polysubstance abuse ([place]) 2011   Pyelonephritis 05/2012   Seizures ([place])    last seizure was 04/2019 per patient    Past Surgical History:  Procedure Laterality Date   COLONOSCOPY WITH PROPOFOL N/A 09/09/2019   Procedure: COLONOSCOPY WITH PROPOFOL;  Surgeon: Wilford Corner, MD;  Location: [place];  Service: Endoscopy;  Laterality: N/A;   ESOPHAGOGASTRODUODENOSCOPY N/A 05/31/2015   Procedure: ESOPHAGOGASTRODUODENOSCOPY (EGD);  Surgeon: Irene Shipper, MD;  [facility] END[facility]COPY;  Service: Endoscopy;  Laterality: N/A;   ESOPHAGOGASTRODUODENOSCOPY N/A 09/09/2019  Procedure: ESOPHAGOGASTRODUODENOSCOPY (EGD);  Surgeon: Wilford Corner, MD;  Location: [place];  Service: Endoscopy;  Laterality: N/A;   ORIF HUMERUS FRACTURE Right 05/12/2019   Procedure: OPEN REDUCTION INTERNAL FIXATION (ORIF) DISTAL HUMERUS FRACTURE;  Surgeon: Hiram Gash, MD;  Location: [facility];  Service: Orthopedics;   Laterality: Right;    reports that she has never smoked. She has never used smokeless tobacco. She reports previous alcohol use of about 10.0 standard drinks of alcohol per week. She reports that she does not use drugs. family history includes Cancer in her brother and mother. Allergies  Allergen Reactions   Penicillins Swelling    .Has patient had a PCN reaction causing immediate rash, facial/tongue/throat swelling, SOB or lightheadedness with hypotension: Yes Has patient had a PCN reaction causing severe rash involving mucus membranes or skin necrosis: No Has patient had a PCN reaction that required hospitalization No Has patient had a PCN reaction occurring within the last 10 years: No If all of the above answers are "NO", then may proceed with Cephalosporin use.       Outpatient Encounter Medications as of 08/10/2020  Medication Sig   amLODipine (NORVASC) 10 MG tablet Take 10 mg by mouth daily.   cetirizine (ZYRTEC) 10 MG tablet Take 10 mg by mouth daily.   ferrous sulfate 324 MG TBEC Take 324 mg by mouth daily with breakfast.   folic acid (FOLVITE) 1 MG tablet Take 1 tablet (1 mg total) by mouth daily.   hydrochlorothiazide (HYDRODIURIL) 25 MG tablet Take 25 mg by mouth daily.   omeprazole (PRILOSEC) 20 MG capsule Take 20 mg by mouth daily.   thiamine 100 MG tablet Take 1 tablet (100 mg total) by mouth daily.   traZODone (DESYREL) 50 MG tablet Take 50 mg by mouth at bedtime.   triamcinolone ointment (KENALOG) 0.1 % Apply 1 application topically 2 (two) times daily as needed (rash).    No facility-administered encounter medications on file as of 08/10/2020.    REVIEW OF SYSTEMS  : All other systems reviewed and negative except where noted in the History of Present Illness.   PHYSICAL EXAM: BP 110/70   Pulse 73   Ht '5\' 3"'  (1.6 m)   Wt 112 lb (50.8 kg)   BMI 19.84 kg/m  General: Well developed AA female in no acute distress Head: Normocephalic and atraumatic Eyes:  Sclerae  anicteric, conjunctiva pink. Ears: Normal auditory acuity Lungs: Clear throughout to auscultation; no W/R/R. Heart: Regular rate and rhythm; no M/R/G. Abdomen: Soft, non-distended.  BS present.  Non-tender. Rectal:  Will be done at the time of colonoscopy. Musculoskeletal: Symmetrical with no gross deformities  Skin: No lesions on visible extremities Extremities: No edema  Neurological: Alert oriented x 4, grossly non-focal Psychological:  Alert and cooperative. Normal mood and affect  ASSESSMENT AND PLAN: *60 year old female with alcohol liver disease: Last labs in May are much improved.  PT/INR is normal.  Platelets were normal.  I am not sure that she has overt cirrhosis at this point.  She was previously drinking large amounts of liquor.  Is no longer drinking liquor, does admit to occasional beer drinking, says that she had two on July 4th and one yesterday.  I congratulated her on her improvements and encouraged her to continue to decrease her consumption hopefully discontinue altogether.  We will recheck labs today including CBC, CMP, PT/INR, and AFP.  Her imaging is up-to-date.  We will plan for EGD with Dr.Pyrtle as well to evaluate for any  sign of esophageal varices *CRC screening:  Had a poor prep last year.  Will schedule with Dr. Hilarie Fredrickson with 2 day bowel prep.    **The risks, benefits, and alternatives to EGD and colonoscopy were discussed with the patient and she consents to proceed.  CC:  [place], [place], * CC:  Lily Peer, FNP

## 2020-08-11 LAB — AFP TUMOR MARKER: AFP-Tumor Marker: 3.1 ng/mL

## 2020-09-01 NOTE — Progress Notes (Signed)
Addendum: Reviewed and agree with assessment and management plan. Synda Bagent M, MD  

## 2020-09-18 ENCOUNTER — Telehealth: Payer: Self-pay | Admitting: Internal Medicine

## 2020-09-18 ENCOUNTER — Encounter: Payer: Self-pay | Admitting: Internal Medicine

## 2020-09-18 NOTE — Telephone Encounter (Signed)
Good morning Dr. Hilarie Fredrickson,   Patient called in to cancel procedure 8/15 due to no one able to bring her. Will call back to reschedule.  Thank you

## 2020-10-12 ENCOUNTER — Emergency Department (HOSPITAL_COMMUNITY)
Admission: EM | Admit: 2020-10-12 | Discharge: 2020-10-12 | Disposition: A | Discharge status: Home / Self Care | Payer: Medicaid Other | Attending: Emergency Medicine | Admitting: Emergency Medicine

## 2020-10-12 ENCOUNTER — Emergency Department (HOSPITAL_COMMUNITY): Payer: Medicaid Other

## 2020-10-12 ENCOUNTER — Other Ambulatory Visit: Payer: Self-pay

## 2020-10-12 ENCOUNTER — Encounter (HOSPITAL_COMMUNITY): Payer: Self-pay

## 2020-10-12 DIAGNOSIS — Z79899 Other long term (current) drug therapy: Secondary | ICD-10-CM | POA: Insufficient documentation

## 2020-10-12 DIAGNOSIS — I10 Essential (primary) hypertension: Secondary | ICD-10-CM | POA: Insufficient documentation

## 2020-10-12 DIAGNOSIS — J45909 Unspecified asthma, uncomplicated: Secondary | ICD-10-CM | POA: Diagnosis not present

## 2020-10-12 DIAGNOSIS — R1084 Generalized abdominal pain: Secondary | ICD-10-CM | POA: Diagnosis present

## 2020-10-12 DIAGNOSIS — K2900 Acute gastritis without bleeding: Secondary | ICD-10-CM | POA: Diagnosis not present

## 2020-10-12 LAB — URINALYSIS, ROUTINE W REFLEX MICROSCOPIC
Bilirubin Urine: NEGATIVE
Glucose, UA: 50 mg/dL — AB
Hgb urine dipstick: NEGATIVE
Ketones, ur: NEGATIVE mg/dL
Leukocytes,Ua: NEGATIVE
Nitrite: NEGATIVE
Protein, ur: NEGATIVE mg/dL
Specific Gravity, Urine: 1.012 (ref 1.005–1.030)
pH: 5 (ref 5.0–8.0)

## 2020-10-12 LAB — COMPREHENSIVE METABOLIC PANEL
ALT: 23 U/L (ref 0–44)
AST: 36 U/L (ref 15–41)
Albumin: 5.2 g/dL — ABNORMAL HIGH (ref 3.5–5.0)
Alkaline Phosphatase: 114 U/L (ref 38–126)
Anion gap: 14 (ref 5–15)
BUN: 16 mg/dL (ref 6–20)
CO2: 26 mmol/L (ref 22–32)
Calcium: 10.1 mg/dL (ref 8.9–10.3)
Chloride: 96 mmol/L — ABNORMAL LOW (ref 98–111)
Creatinine, Ser: 0.69 mg/dL (ref 0.44–1.00)
GFR, Estimated: >60 mL/min (ref >60)
Glucose, Bld: 111 mg/dL — ABNORMAL HIGH (ref 70–99)
Potassium: 3.2 mmol/L — ABNORMAL LOW (ref 3.5–5.1)
Sodium: 136 mmol/L (ref 135–145)
Total Bilirubin: 1.2 mg/dL (ref 0.3–1.2)
Total Protein: 9.3 g/dL — ABNORMAL HIGH (ref 6.5–8.1)

## 2020-10-12 LAB — CBC
HCT: 40.8 % (ref 36.0–46.0)
Hemoglobin: 13.5 g/dL (ref 12.0–15.0)
MCH: 30.5 pg (ref 26.0–34.0)
MCHC: 33.1 g/dL (ref 30.0–36.0)
MCV: 92.1 fL (ref 80.0–100.0)
Platelets: 132 10*3/uL — ABNORMAL LOW (ref 150–400)
RBC: 4.43 MIL/uL (ref 3.87–5.11)
RDW: 14.4 % (ref 11.5–15.5)
WBC: 6.2 10*3/uL (ref 4.0–10.5)
nRBC: 0 % (ref 0.0–0.2)

## 2020-10-12 LAB — I-STAT BETA HCG BLOOD, ED (MC, WL, AP ONLY): I-stat hCG, quantitative: <5 m[IU]/mL (ref <5)

## 2020-10-12 LAB — LIPASE, BLOOD: Lipase: 21 U/L (ref 11–51)

## 2020-10-12 MED ORDER — PANTOPRAZOLE SODIUM 40 MG PO TBEC
40.0000 mg | DELAYED_RELEASE_TABLET | Freq: Once | ORAL | Status: AC
  Administered 2020-10-12: 40 mg via ORAL
  Filled 2020-10-12: qty 1

## 2020-10-12 MED ORDER — OMEPRAZOLE 20 MG PO CPDR: 20.0000 mg | DELAYED_RELEASE_CAPSULE | Freq: Every day | ORAL | 0 refills | Status: AC

## 2020-10-12 MED ORDER — IOHEXOL 350 MG/ML SOLN
80.0000 mL | Freq: Once | INTRAVENOUS | Status: AC | PRN
  Administered 2020-10-12: 80 mL via INTRAVENOUS

## 2020-10-12 NOTE — ED Triage Notes (Signed)
RLQ ABD pain x 3 weeks with diarrhea.

## 2020-10-12 NOTE — ED Notes (Signed)
ED Provider at bedside. 

## 2020-10-12 NOTE — ED Provider Notes (Signed)
[facility] DEPT Provider Note   CSN: [id number] Arrival date & time: 10/12/20  1457     History Chief Complaint  Patient presents with   Abdominal Pain    Brandy Bishop is a 60 y.o. female.   Abdominal Pain Pain location:  Generalized Pain quality: aching, cramping and sharp   Pain radiation: back. Pain severity:  Moderate Onset quality:  Gradual Duration:  1 week Timing:  Constant Progression:  Worsening Context: alcohol use   Worsened by:  Nothing Ineffective treatments:  None tried Associated symptoms: diarrhea   Associated symptoms: no dysuria, no fever and no vomiting       Past Medical History:  Diagnosis Date   Alcoholism ([place]) 2011   Asthma    Chronic lower back pain    Colitis 04/2014.   Bloody diarrhea   Daily headache    GIB (gastrointestinal bleeding)    Hypertension    Pancreatitis    Chronic pancreatitis noted on CT scan in 04/2014.   Polysubstance abuse ([place]) 2011   Pyelonephritis 05/2012   Seizures ([place])    last seizure was 04/2019 per patient     Patient Active Problem List   Diagnosis Date Noted   Colon cancer screening 08/10/2020   Acute liver failure 06/08/2020   Acute GI bleeding 09/08/2019   Polysubstance abuse ([place]) 09/08/2019   Gastroesophageal reflux disease with esophagitis    Seizure ([place]) 05/30/2015   Cough    Subtherapeutic phenytoin level    Acute upper GI bleed    Hypomagnesemia 04/16/2014   Hypokalemia 04/16/2014   Thrombocytopenia ([place]) 04/16/2014   Liver disease 04/15/2014   ETOH abuse 04/15/2014   Chronic pancreatitis ([place]) 04/15/2014   Protein-calorie malnutrition, severe ([place]) 04/15/2014   Severe protein-calorie malnutrition ([place]) 04/15/2014   Bleeding gastrointestinal    Abnormal CT scan, colon    Rectal bleeding    Abdominal pain    Alcoholic hepatitis without ascites    GI bleed 04/14/2014   Viral URI with cough 05/07/2012   Pyelonephritis 05/07/2012   Seizure disorder  ([place]) 05/07/2012   Asthma with bronchitis 05/07/2012   HTN (hypertension) 05/07/2012    Past Surgical History:  Procedure Laterality Date   COLONOSCOPY WITH PROPOFOL N/A 09/09/2019   Procedure: COLONOSCOPY WITH PROPOFOL;  Surgeon: Wilford Corner, MD;  Location: [facility];  Service: Endoscopy;  Laterality: N/A;   ESOPHAGOGASTRODUODENOSCOPY N/A 05/31/2015   Procedure: ESOPHAGOGASTRODUODENOSCOPY (EGD);  Surgeon: Irene Shipper, MD;  Location: [facility] ENDOSCOPY;  Service: Endoscopy;  Laterality: N/A;   ESOPHAGOGASTRODUODENOSCOPY N/A 09/09/2019   Procedure: ESOPHAGOGASTRODUODENOSCOPY (EGD);  Surgeon: Wilford Corner, MD;  Location: [facility];  Service: Endoscopy;  Laterality: N/A;   ORIF HUMERUS FRACTURE Right 05/12/2019   Procedure: OPEN REDUCTION INTERNAL FIXATION (ORIF) DISTAL HUMERUS FRACTURE;  Surgeon: Hiram Gash, MD;  Location: [facility];  Service: Orthopedics;  Laterality: Right;     OB History   No obstetric history on file.     Family History  Problem Relation Age of Onset   Cancer Mother    Cancer Brother     Social History   Tobacco Use   Smoking status: Never   Smokeless tobacco: Never  Vaping Use   Vaping Use: Never used  Substance Use Topics   Alcohol use: Not Currently    Alcohol/week: 10.0 standard drinks    Types: 10 Cans of beer per week    Comment: just occ a drink   Drug use: No    Home  Medications Prior to Admission medications   Medication Sig Start Date End Date Taking? Authorizing Provider  amLODipine (NORVASC) 10 MG tablet Take 10 mg by mouth daily. 08/13/19   [provider]  cetirizine (ZYRTEC) 10 MG tablet Take 10 mg by mouth daily. 12/28/19   [provider]  ferrous sulfate 324 MG TBEC Take 324 mg by mouth daily with breakfast.    [provider]  folic acid (FOLVITE) 1 MG tablet Take 1 tablet (1 mg total) by mouth daily. 09/10/19   Nolberto Hanlon, MD  hydrochlorothiazide (HYDRODIURIL) 25 MG tablet Take 25 mg by mouth  daily. 12/28/19   [provider]  omeprazole (PRILOSEC) 20 MG capsule Take 1 capsule (20 mg total) by mouth daily. 10/12/20 11/11/20  Dorie Rank, MD  thiamine 100 MG tablet Take 1 tablet (100 mg total) by mouth daily. 09/10/19   Nolberto Hanlon, MD  traZODone (DESYREL) 50 MG tablet Take 50 mg by mouth at bedtime. 12/28/19   [provider]  triamcinolone ointment (KENALOG) 0.1 % Apply 1 application topically 2 (two) times daily as needed (rash).  05/20/19   [provider]    Allergies    Penicillins  Review of Systems   Review of Systems  Constitutional:  Negative for fever.  Gastrointestinal:  Positive for abdominal pain and diarrhea. Negative for vomiting.  Genitourinary:  Negative for dysuria.   Physical Exam Updated Vital Signs BP (!) 145/86   Pulse 82   Temp 98 F (36.7 C)   Resp 18   Ht 1.6 m ('5\' 3"'$ )   Wt 50 kg   SpO2 100%   BMI 19.53 kg/m   Physical Exam Vitals and nursing note reviewed.  Constitutional:      General: She is not in acute distress.    Appearance: She is well-developed.  HENT:     Head: Normocephalic and atraumatic.     Right Ear: External ear normal.     Left Ear: External ear normal.  Eyes:     General: No scleral icterus.       Right eye: No discharge.        Left eye: No discharge.     Conjunctiva/sclera: Conjunctivae normal.  Neck:     Trachea: No tracheal deviation.  Cardiovascular:     Rate and Rhythm: Normal rate and regular rhythm.  Pulmonary:     Effort: Pulmonary effort is normal. No respiratory distress.     Breath sounds: Normal breath sounds. No stridor. No wheezing or rales.  Abdominal:     General: Bowel sounds are normal. There is no distension.     Palpations: Abdomen is soft.     Tenderness: There is abdominal tenderness in the right upper quadrant. There is no guarding or rebound.  Musculoskeletal:        General: No tenderness or deformity.     Cervical back: Neck supple.  Skin:    General: Skin  is warm and dry.     Findings: No rash.  Neurological:     General: No focal deficit present.     Mental Status: She is alert.     Cranial Nerves: No cranial nerve deficit (no facial droop, extraocular movements intact, no slurred speech).     Sensory: No sensory deficit.     Motor: No abnormal muscle tone or seizure activity.     Coordination: Coordination normal.  Psychiatric:        Mood and Affect: Mood normal.  ED Results / Procedures / Treatments   Labs (all labs ordered are listed, but only abnormal results are displayed) Labs Reviewed  COMPREHENSIVE METABOLIC PANEL - Abnormal; Notable for the following components:      Result Value   Potassium 3.2 (*)    Chloride 96 (*)    Glucose, Bld 111 (*)    Total Protein 9.3 (*)    Albumin 5.2 (*)    All other components within normal limits  CBC - Abnormal; Notable for the following components:   Platelets 132 (*)    All other components within normal limits  URINALYSIS, ROUTINE W REFLEX MICROSCOPIC - Abnormal; Notable for the following components:   Glucose, UA 50 (*)    All other components within normal limits  LIPASE, BLOOD  I-STAT BETA HCG BLOOD, ED ([facility], [facility], [facility] ONLY)    EKG None  Radiology CT ABDOMEN PELVIS W CONTRAST  Result Date: 10/12/2020 CLINICAL DATA:  Acute abdominal pain. EXAM: CT ABDOMEN AND PELVIS WITH CONTRAST TECHNIQUE: Multidetector CT imaging of the abdomen and pelvis was performed using the standard protocol following bolus administration of intravenous contrast. CONTRAST:  73m OMNIPAQUE IOHEXOL 350 MG/ML SOLN COMPARISON:  CT 06/08/2020 FINDINGS: Lower chest: No focal airspace disease or pleural effusion. The heart is normal in size. Hepatobiliary: Minimal focal fatty infiltration adjacent to the falciform ligament. Less pronounced hepatic steatosis and on prior exam. Gallbladder physiologically distended, no calcified stone. No biliary dilatation. Pancreas: Sequela of chronic pancreatitis with  calcifications and parenchymal atrophy. No acute peripancreatic fat stranding or inflammation. Spleen: Normal in size without focal abnormality. Adrenals/Urinary Tract: No adrenal nodule. No hydronephrosis or perinephric edema. Homogeneous renal enhancement with symmetric excretion on delayed phase imaging. Subcentimeter low-density in the lower right kidney may be scarring or tiny cyst. No visualized renal stones. Urinary bladder is physiologically distended without wall thickening. Stomach/Bowel: Similar antral gastric wall thickening to prior exam. Stomach is otherwise decompressed and not well assessed. No small bowel obstruction or inflammation. The small bowel diverticula on prior exam are not as well visualized, but seen in the right lower quadrant. The sigmoid colon is redundant and courses into the upper abdomen. Moderate stool throughout the remainder of the colon. Multiple colonic diverticula without diverticulitis. Normal appendix. Vascular/Lymphatic: Aortic atherosclerosis. No aortic aneurysm. Patent portal vein. No acute vascular findings. No enlarged lymph nodes in the abdomen or pelvis. Reproductive: Uterus and bilateral adnexa are unremarkable. Other: No free air, free fluid, or intra-abdominal fluid collection. Musculoskeletal: L4-L5 and L5-S1 degenerative disc disease. There are no acute or suspicious osseous abnormalities. IMPRESSION: 1. Similar antral gastric wall thickening, can be seen with gastritis or peptic ulcer disease. 2. Colonic diverticulosis without diverticulitis. Few small bowel diverticula. No active inflammation. 3. Sequela of chronic pancreatitis without acute inflammation. Aortic Atherosclerosis (ICD10-I70.0). Electronically Signed   By: MKeith RakeM.D.   On: 10/12/2020 22:37    Procedures Procedures   Medications Ordered in ED Medications  pantoprazole (PROTONIX) EC tablet 40 mg (has no administration in time range)  iohexol (OMNIPAQUE) 350 MG/ML injection 80 mL  (80 mLs Intravenous Contrast Given 10/12/20 2220)    ED Course  I have reviewed the triage vital signs and the nursing notes.  Pertinent labs & imaging results that were available during my care of the patient were reviewed by me and considered in my medical decision making (see chart for details).  Clinical Course as of 10/12/20 2310  Thu Oct 12, 2020  2249 Cbc normal CMet with  mild hypok Ua negative [JK]  2250 CT scans shows gastric thickening.  No other acute finding [JK]    Clinical Course User Index [JK] Dorie Rank, MD   MDM Rules/Calculators/A&P                           Patient presented to the ED for evaluation abdominal pain.  Patient does have history of liver disease.  Patient does admit to some recent alcohol use.  ED work-up does not show any signs of hepatitis.  Doubt cholecystitis.  No findings of pancreatitis.  CT scan was performed and it does show some gastric thickening.  Possible symptoms are related to gastritis.  Patient states she is not taking her antacids.  We will have her start those medications.  Follow-up with her GI doctor. Final Clinical Impression(s) / ED Diagnoses Final diagnoses:  Acute gastritis without hemorrhage, unspecified gastritis type    Rx / DC Orders ED Discharge Orders          Ordered    omeprazole (PRILOSEC) 20 MG capsule  Daily        10/12/20 2252             Dorie Rank, MD 10/12/20 2312

## 2020-10-12 NOTE — Discharge Instructions (Addendum)
Take the antacid medications as prescribed.  Follow up with your gi doctor

## 2020-10-12 NOTE — ED Notes (Signed)
Attempted to obtain blood work without succes

## 2020-10-12 NOTE — ED Notes (Signed)
Attempted to get blood but was unsuccessful 

## 2020-10-12 NOTE — ED Provider Notes (Signed)
Emergency Medicine Provider Triage Evaluation Note  Brandy Bishop , a 60 y.o. female  was evaluated in triage.  Pt complains of generalized abdominal pain for the last week or so.  Was told she had a "sick liver" some years back.  Had a couple beers prior to the pain.  No nausea, vomiting, diarrhea, chills.  Review of Systems  Positive: Subjective fever  Negative:  Physical Exam  BP (!) 139/96   Pulse 100   Temp 98 F (36.7 C)   Resp 20   Ht '5\' 3"'$  (1.6 m)   Wt 50 kg   SpO2 98%   BMI 19.53 kg/m  Gen:   Awake, no distress   Resp:  Normal effort, clear to auscultation bilaterally MSK:   Moves extremities without difficulty  Other:  Moderate generalized abdominal tenderness  Medical Decision Making  Medically screening exam initiated at 3:36 PM.  Appropriate orders placed.  Brandy Bishop was informed that the remainder of the evaluation will be completed by another provider, this initial triage assessment does not replace that evaluation, and the importance of remaining in the ED until their evaluation is complete.     Myna Bright [place], PA-C 10/12/20 1537    Milton Ferguson, MD 10/14/20 1319

## 2020-10-26 IMAGING — DX DG CHEST 1V PORT
1 series · 1 of 1 positions shown · non-contrast
Comparison: July 30, 2019.

CLINICAL DATA: Chest pain.

EXAM:
PORTABLE CHEST 1 VIEW

[chest]
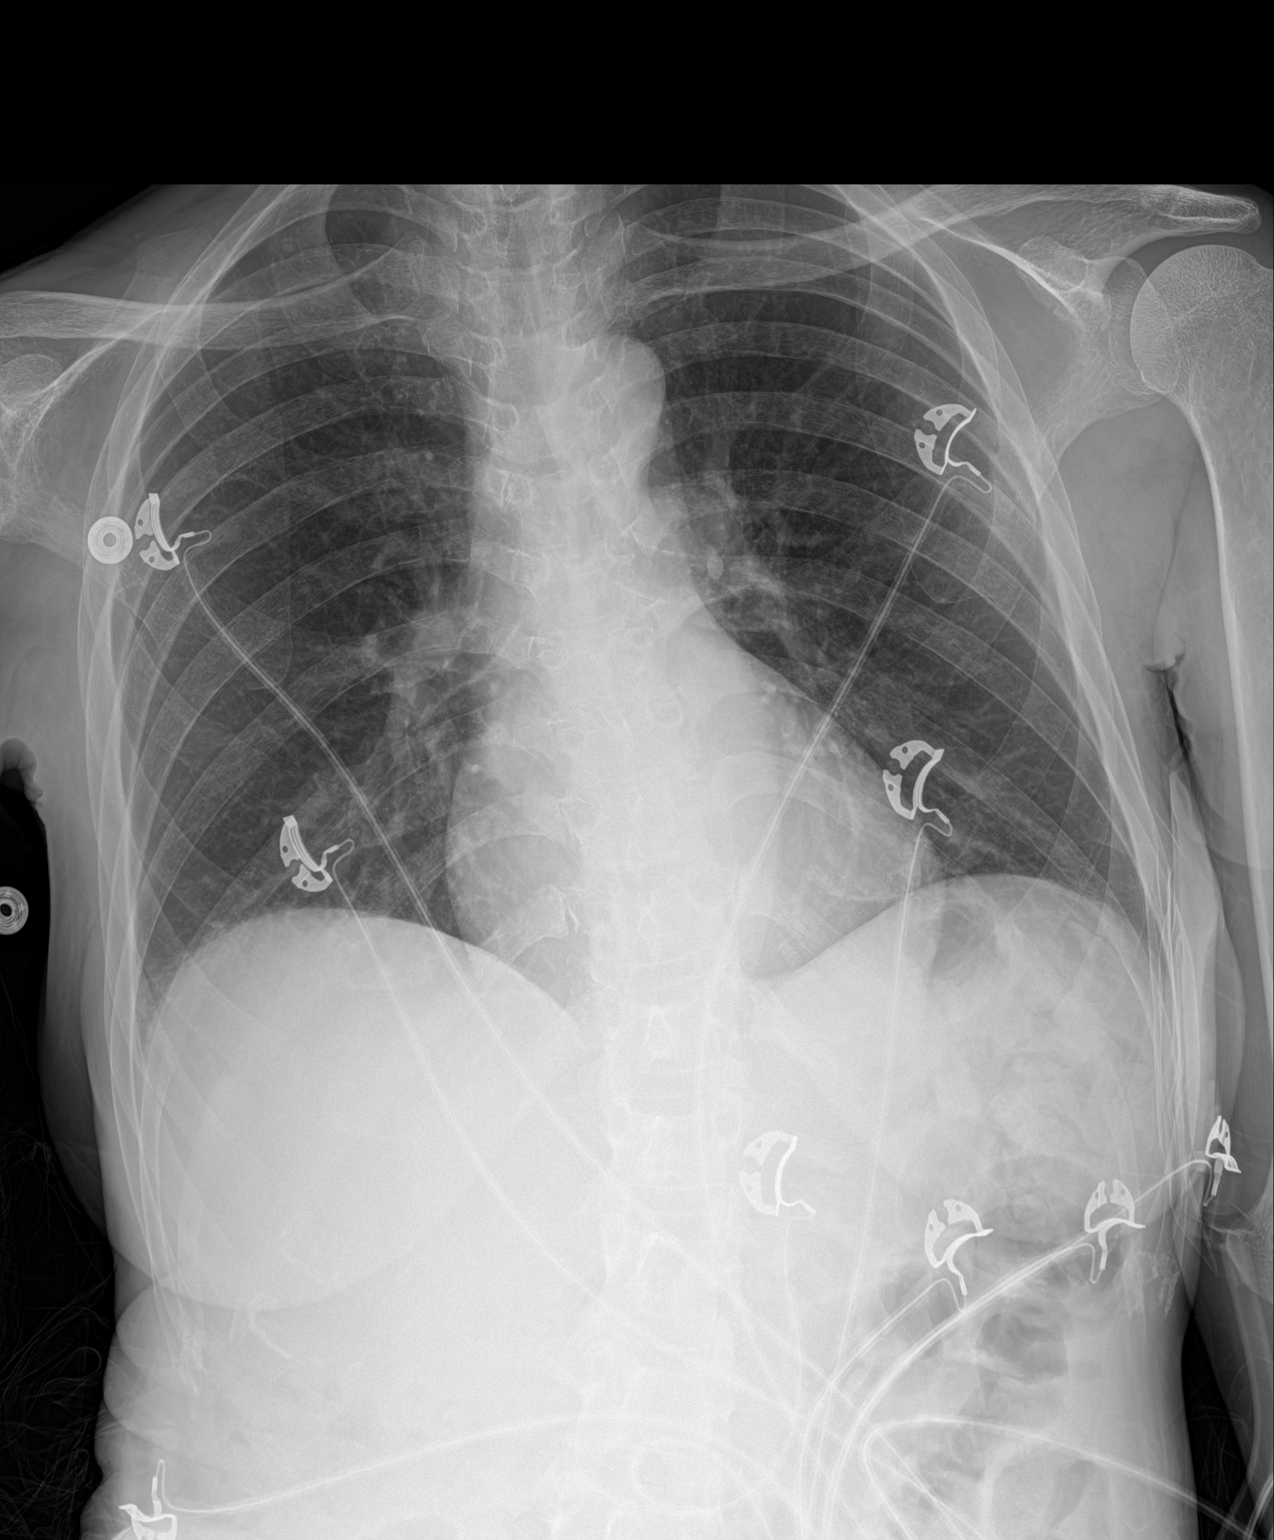

[1 of 1 positions shown; findings below may reference images not displayed]

FINDINGS: The heart size and mediastinal contours are within normal limits.
Both lungs are clear. No pneumothorax or pleural effusion is noted.
The visualized skeletal structures are unremarkable.
IMPRESSION: No active disease.

## 2021-04-03 ENCOUNTER — Other Ambulatory Visit: Payer: Self-pay | Admitting: Family Medicine

## 2021-04-03 DIAGNOSIS — Z1231 Encounter for screening mammogram for malignant neoplasm of breast: Secondary | ICD-10-CM

## 2021-05-18 ENCOUNTER — Ambulatory Visit: Payer: Medicaid Other

## 2021-07-25 ENCOUNTER — Emergency Department (HOSPITAL_COMMUNITY): Payer: Medicaid Other

## 2021-07-25 ENCOUNTER — Emergency Department (HOSPITAL_COMMUNITY)
Admission: EM | Admit: 2021-07-25 | Discharge: 2021-07-25 | Disposition: A | Discharge status: Home / Self Care | Payer: Medicaid Other | Attending: Emergency Medicine | Admitting: Emergency Medicine

## 2021-07-25 DIAGNOSIS — I10 Essential (primary) hypertension: Secondary | ICD-10-CM | POA: Insufficient documentation

## 2021-07-25 DIAGNOSIS — Z79899 Other long term (current) drug therapy: Secondary | ICD-10-CM | POA: Diagnosis not present

## 2021-07-25 DIAGNOSIS — M545 Low back pain, unspecified: Secondary | ICD-10-CM | POA: Insufficient documentation

## 2021-07-25 DIAGNOSIS — W19XXXA Unspecified fall, initial encounter: Secondary | ICD-10-CM

## 2021-07-25 DIAGNOSIS — F1090 Alcohol use, unspecified, uncomplicated: Secondary | ICD-10-CM | POA: Diagnosis not present

## 2021-07-25 DIAGNOSIS — S0990XA Unspecified injury of head, initial encounter: Secondary | ICD-10-CM | POA: Diagnosis present

## 2021-07-25 DIAGNOSIS — S0101XA Laceration without foreign body of scalp, initial encounter: Secondary | ICD-10-CM | POA: Diagnosis not present

## 2021-07-25 DIAGNOSIS — J45909 Unspecified asthma, uncomplicated: Secondary | ICD-10-CM | POA: Insufficient documentation

## 2021-07-25 DIAGNOSIS — W109XXA Fall (on) (from) unspecified stairs and steps, initial encounter: Secondary | ICD-10-CM | POA: Diagnosis not present

## 2021-07-25 DIAGNOSIS — Z789 Other specified health status: Secondary | ICD-10-CM

## 2021-07-25 LAB — COMPREHENSIVE METABOLIC PANEL
ALT: 32 U/L (ref 0–44)
AST: 74 U/L — ABNORMAL HIGH (ref 15–41)
Albumin: 3.1 g/dL — ABNORMAL LOW (ref 3.5–5.0)
Alkaline Phosphatase: 148 U/L — ABNORMAL HIGH (ref 38–126)
Anion gap: 15 (ref 5–15)
BUN: 11 mg/dL (ref 6–20)
CO2: 28 mmol/L (ref 22–32)
Calcium: 9.3 mg/dL (ref 8.9–10.3)
Chloride: 95 mmol/L — ABNORMAL LOW (ref 98–111)
Creatinine, Ser: 0.46 mg/dL (ref 0.44–1.00)
GFR, Estimated: >60 mL/min (ref >60)
Glucose, Bld: 79 mg/dL (ref 70–99)
Potassium: 3.8 mmol/L (ref 3.5–5.1)
Sodium: 138 mmol/L (ref 135–145)
Total Bilirubin: 0.7 mg/dL (ref 0.3–1.2)
Total Protein: 6.7 g/dL (ref 6.5–8.1)

## 2021-07-25 LAB — CBC WITH DIFFERENTIAL/PLATELET
Abs Immature Granulocytes: 0.01 10*3/uL (ref 0.00–0.07)
Basophils Absolute: 0 10*3/uL (ref 0.0–0.1)
Basophils Relative: 1 %
Eosinophils Absolute: 0 10*3/uL (ref 0.0–0.5)
Eosinophils Relative: 1 %
HCT: 26.2 % — ABNORMAL LOW (ref 36.0–46.0)
Hemoglobin: 9.1 g/dL — ABNORMAL LOW (ref 12.0–15.0)
Immature Granulocytes: 0 %
Lymphocytes Relative: 33 %
Lymphs Abs: 1.9 10*3/uL (ref 0.7–4.0)
MCH: 34.9 pg — ABNORMAL HIGH (ref 26.0–34.0)
MCHC: 34.7 g/dL (ref 30.0–36.0)
MCV: 100.4 fL — ABNORMAL HIGH (ref 80.0–100.0)
Monocytes Absolute: 1.1 10*3/uL — ABNORMAL HIGH (ref 0.1–1.0)
Monocytes Relative: 20 %
Neutro Abs: 2.5 10*3/uL (ref 1.7–7.7)
Neutrophils Relative %: 45 %
Platelets: 144 10*3/uL — ABNORMAL LOW (ref 150–400)
RBC: 2.61 MIL/uL — ABNORMAL LOW (ref 3.87–5.11)
RDW: 16.3 % — ABNORMAL HIGH (ref 11.5–15.5)
WBC: 5.7 10*3/uL (ref 4.0–10.5)
nRBC: 0 % (ref 0.0–0.2)

## 2021-07-25 LAB — PROTIME-INR
INR: 1 (ref 0.8–1.2)
Prothrombin Time: 13.4 seconds (ref 11.4–15.2)

## 2021-07-25 LAB — ETHANOL: Alcohol, Ethyl (B): 336 mg/dL (ref <10)

## 2021-07-25 NOTE — Discharge Instructions (Signed)
Have the staples taken out in about 7 to 10 days.

## 2021-07-25 NOTE — ED Notes (Signed)
Patient transported to CT 

## 2021-07-25 NOTE — ED Notes (Signed)
[place] j ccollar applied

## 2021-07-25 NOTE — ED Triage Notes (Addendum)
Pt via [facility] after mechanical fall at home, per report pt fell from steps. Ccollar in place on arrival. Pt presents as poor historian, +ETOH, ?thinners. Pt alert to baseline on arrival, -LOC Lac/hematoma to occipital region of head, bleeding controlled.

## 2021-07-25 NOTE — ED Provider Notes (Signed)
[facility] EMERGENCY DEPARTMENT Provider Note   CSN: [id number] Arrival date & time: 07/25/21  1654     History  Chief Complaint  Patient presents with   Brandy Bishop is a 61 y.o. female.   Fall  Patient presents after fall.  Reportedly was at a friend's house going up the stairs and lost her balance and fell backward striking her head.  Complaining of pain in her head and somewhat in her lower back.  Does have a history of heavy alcohol use/alcoholism.  No loss consciousness.  Has been ambulatory since.  No neck pain.    Past Medical History:  Diagnosis Date   Alcoholism ([place]) 2011   Asthma    Chronic lower back pain    Colitis 04/2014.   Bloody diarrhea   Daily headache    GIB (gastrointestinal bleeding)    Hypertension    Pancreatitis    Chronic pancreatitis noted on CT scan in 04/2014.   Polysubstance abuse ([place]) 2011   Pyelonephritis 05/2012   Seizures ([place])    last seizure was 04/2019 per patient     Home Medications Prior to Admission medications   Medication Sig Start Date End Date Taking? Authorizing Provider  amLODipine (NORVASC) 10 MG tablet Take 10 mg by mouth daily. 08/13/19   [provider]  cetirizine (ZYRTEC) 10 MG tablet Take 10 mg by mouth daily. 12/28/19   [provider]  ferrous sulfate 324 MG TBEC Take 324 mg by mouth daily with breakfast.    [provider]  folic acid (FOLVITE) 1 MG tablet Take 1 tablet (1 mg total) by mouth daily. 09/10/19   Nolberto Hanlon, MD  hydrochlorothiazide (HYDRODIURIL) 25 MG tablet Take 25 mg by mouth daily. 12/28/19   [provider]  omeprazole (PRILOSEC) 20 MG capsule Take 1 capsule (20 mg total) by mouth daily. 10/12/20 11/11/20  Dorie Rank, MD  thiamine 100 MG tablet Take 1 tablet (100 mg total) by mouth daily. 09/10/19   Nolberto Hanlon, MD  traZODone (DESYREL) 50 MG tablet Take 50 mg by mouth at bedtime. 12/28/19   [provider]  triamcinolone  ointment (KENALOG) 0.1 % Apply 1 application topically 2 (two) times daily as needed (rash).  05/20/19   [provider]      Allergies    Penicillins    Review of Systems   Review of Systems  Physical Exam Updated Vital Signs BP 116/74   Pulse 95   Temp 99 F (37.2 C) (Oral)   Resp 18   SpO2 100%  Physical Exam Vitals and nursing note reviewed.  Eyes:     Pupils: Pupils are equal, round, and reactive to light.  Cardiovascular:     Rate and Rhythm: Normal rate.  Chest:     Chest wall: No tenderness.  Abdominal:     Tenderness: There is no abdominal tenderness.  Musculoskeletal:        General: Tenderness present.     Cervical back: Neck supple.     Comments: Mild lumbar tenderness out deformity.  No ecchymosis.  Skin:    Capillary Refill: Capillary refill takes less than 2 seconds.  Neurological:     Mental Status: She is alert and oriented to person, place, and time.     ED Results / Procedures / Treatments   Labs (all labs ordered are listed, but only abnormal results are displayed) Labs Reviewed  COMPREHENSIVE METABOLIC PANEL - Abnormal; Notable for  the following components:      Result Value   Chloride 95 (*)    Albumin 3.1 (*)    AST 74 (*)    Alkaline Phosphatase 148 (*)    All other components within normal limits  ETHANOL - Abnormal; Notable for the following components:   Alcohol, Ethyl (B) 336 (*)    All other components within normal limits  CBC WITH DIFFERENTIAL/PLATELET - Abnormal; Notable for the following components:   RBC 2.61 (*)    Hemoglobin 9.1 (*)    HCT 26.2 (*)    MCV 100.4 (*)    MCH 34.9 (*)    RDW 16.3 (*)    Platelets 144 (*)    Monocytes Absolute 1.1 (*)    All other components within normal limits  PROTIME-INR    EKG None  Radiology CT HEAD WO CONTRAST (5MM)  Result Date: 07/25/2021 CLINICAL DATA:  Head trauma, moderate-severe; Neck trauma, intoxicated or obtunded (Age >= 16y). after mechanical fall at home,  per report pt fell from steps. Ccollar in place on arrival. Pt presents as poor historian, +ETOH, ?thinners. EXAM: CT HEAD WITHOUT CONTRAST CT CERVICAL SPINE WITHOUT CONTRAST TECHNIQUE: Multidetector CT imaging of the head and cervical spine was performed following the standard protocol without intravenous contrast. Multiplanar CT image reconstructions of the cervical spine were also generated. RADIATION DOSE REDUCTION: This exam was performed according to the departmental dose-optimization program which includes automated exposure control, adjustment of the mA and/or kV according to patient size and/or use of iterative reconstruction technique. COMPARISON:  CT head and C-spine 01/15/2020 FINDINGS: CT HEAD FINDINGS BRAIN: BRAIN Cerebral ventricle sizes are concordant with the degree of cerebral volume loss. Patchy and confluent areas of decreased attenuation are noted throughout the deep and periventricular white matter of the cerebral hemispheres bilaterally, compatible with chronic microvascular ischemic disease. Chronic left parieto-occipital encephalomalacia. No evidence of large-territorial acute infarction. No parenchymal hemorrhage. No mass lesion. No extra-axial collection. No mass effect or midline shift. No hydrocephalus. Basilar cisterns are patent. Vascular: No hyperdense vessel. Atherosclerotic calcifications are present within the cavernous internal carotid and vertebral arteries. Skull: No acute fracture or focal lesion. Sinuses/Orbits: Paranasal sinuses and mastoid air cells are clear. The orbits are unremarkable. Other: None. CT CERVICAL SPINE FINDINGS Alignment: Normal. Skull base and vertebrae: Multilevel mild degenerative changes of the spine. No acute fracture. No aggressive appearing focal osseous lesion or focal pathologic process. Soft tissues and spinal canal: No prevertebral fluid or swelling. No visible canal hematoma. Upper chest: Unremarkable. Other: None. IMPRESSION: 1. No acute  intracranial abnormality. 2. No acute displaced fracture or traumatic listhesis of the cervical spine. Electronically Signed   By: Iven Finn M.D.   On: 07/25/2021 18:20   CT Cervical Spine Wo Contrast  Result Date: 07/25/2021 CLINICAL DATA:  Head trauma, moderate-severe; Neck trauma, intoxicated or obtunded (Age >= 16y). after mechanical fall at home, per report pt fell from steps. Ccollar in place on arrival. Pt presents as poor historian, +ETOH, ?thinners. EXAM: CT HEAD WITHOUT CONTRAST CT CERVICAL SPINE WITHOUT CONTRAST TECHNIQUE: Multidetector CT imaging of the head and cervical spine was performed following the standard protocol without intravenous contrast. Multiplanar CT image reconstructions of the cervical spine were also generated. RADIATION DOSE REDUCTION: This exam was performed according to the departmental dose-optimization program which includes automated exposure control, adjustment of the mA and/or kV according to patient size and/or use of iterative reconstruction technique. COMPARISON:  CT head and C-spine 01/15/2020 FINDINGS: CT  HEAD FINDINGS BRAIN: BRAIN Cerebral ventricle sizes are concordant with the degree of cerebral volume loss. Patchy and confluent areas of decreased attenuation are noted throughout the deep and periventricular white matter of the cerebral hemispheres bilaterally, compatible with chronic microvascular ischemic disease. Chronic left parieto-occipital encephalomalacia. No evidence of large-territorial acute infarction. No parenchymal hemorrhage. No mass lesion. No extra-axial collection. No mass effect or midline shift. No hydrocephalus. Basilar cisterns are patent. Vascular: No hyperdense vessel. Atherosclerotic calcifications are present within the cavernous internal carotid and vertebral arteries. Skull: No acute fracture or focal lesion. Sinuses/Orbits: Paranasal sinuses and mastoid air cells are clear. The orbits are unremarkable. Other: None. CT CERVICAL  SPINE FINDINGS Alignment: Normal. Skull base and vertebrae: Multilevel mild degenerative changes of the spine. No acute fracture. No aggressive appearing focal osseous lesion or focal pathologic process. Soft tissues and spinal canal: No prevertebral fluid or swelling. No visible canal hematoma. Upper chest: Unremarkable. Other: None. IMPRESSION: 1. No acute intracranial abnormality. 2. No acute displaced fracture or traumatic listhesis of the cervical spine. Electronically Signed   By: Iven Finn M.D.   On: 07/25/2021 18:20   CT Lumbar Spine Wo Contrast  Result Date: 07/25/2021 CLINICAL DATA:  Back trauma, mechanical fall at home EXAM: CT LUMBAR SPINE WITHOUT CONTRAST TECHNIQUE: Multidetector CT imaging of the lumbar spine was performed without intravenous contrast administration. Multiplanar CT image reconstructions were also generated. RADIATION DOSE REDUCTION: This exam was performed according to the departmental dose-optimization program which includes automated exposure control, adjustment of the mA and/or kV according to patient size and/or use of iterative reconstruction technique. COMPARISON:  Lumbar radiograph 02/14/2016 FINDINGS: Segmentation: 5 lumbar type vertebrae Alignment: No traumatic malalignment Vertebrae: Remote transverse process fractures, healed. Probable remote lower sacral fracture. No acute fracture. Paraspinal and other soft tissues: Pronounced hepatic steatosis. Chronic calcific pancreatitis with atrophy, known from a 2022 abdominal CT. Disc levels: Disc narrowing and bulging with endplate degeneration at L4-5 and L5-S1. Mild degenerative facet spurring at lower lumbar levels. No visible impingement IMPRESSION: No acute finding. Electronically Signed   By: Jorje Guild M.D.   On: 07/25/2021 18:10    Procedures Procedures    Medications Ordered in ED Medications - No data to display  ED Course/ Medical Decision Making/ A&P                           Medical Decision  Making Amount and/or Complexity of Data Reviewed Labs: ordered. Radiology: ordered.  Patient with fall.  Lost balance and fell backwards striking her head.  Had small laceration that needed closing.  Single staple was placed.  CT head done as well as CT cervical spine.  Independently interpreted by me and did not show intracranial hemorrhage or fracture.  Lumbar spine CT also done and reassuring.  Feeling better.  Tolerating orals.  Will discharge home.  Staple out in about 7 to 10 days.  Initial differential diagnosis included but not limited to, laceration, intracranial hemorrhage, cervical spine fracture, lumbar injury.        Final Clinical Impression(s) / ED Diagnoses Final diagnoses:  Fall, initial encounter  Laceration of scalp, initial encounter  Alcohol use    Rx / DC Orders ED Discharge Orders     None         Davonna Belling, MD 07/25/21 2020

## 2021-07-25 NOTE — ED Notes (Signed)
C collar removed

## 2021-10-07 ENCOUNTER — Encounter (HOSPITAL_COMMUNITY): Payer: Self-pay | Admitting: Emergency Medicine

## 2021-10-07 ENCOUNTER — Other Ambulatory Visit: Payer: Self-pay

## 2021-10-07 ENCOUNTER — Emergency Department (HOSPITAL_COMMUNITY)
Admission: EM | Admit: 2021-10-07 | Discharge: 2021-10-08 | Disposition: A | Discharge status: Home / Self Care | Payer: Medicaid Other | Attending: Emergency Medicine | Admitting: Emergency Medicine

## 2021-10-07 DIAGNOSIS — R1012 Left upper quadrant pain: Secondary | ICD-10-CM | POA: Diagnosis not present

## 2021-10-07 DIAGNOSIS — Z79899 Other long term (current) drug therapy: Secondary | ICD-10-CM | POA: Insufficient documentation

## 2021-10-07 DIAGNOSIS — I10 Essential (primary) hypertension: Secondary | ICD-10-CM | POA: Diagnosis not present

## 2021-10-07 DIAGNOSIS — R1010 Upper abdominal pain, unspecified: Secondary | ICD-10-CM

## 2021-10-07 DIAGNOSIS — J45909 Unspecified asthma, uncomplicated: Secondary | ICD-10-CM | POA: Diagnosis not present

## 2021-10-07 LAB — CBC
HCT: 28.4 % — ABNORMAL LOW (ref 36.0–46.0)
Hemoglobin: 9.7 g/dL — ABNORMAL LOW (ref 12.0–15.0)
MCH: 34.4 pg — ABNORMAL HIGH (ref 26.0–34.0)
MCHC: 34.2 g/dL (ref 30.0–36.0)
MCV: 100.7 fL — ABNORMAL HIGH (ref 80.0–100.0)
Platelets: 74 10*3/uL — ABNORMAL LOW (ref 150–400)
RBC: 2.82 MIL/uL — ABNORMAL LOW (ref 3.87–5.11)
RDW: 13.9 % (ref 11.5–15.5)
WBC: 3 10*3/uL — ABNORMAL LOW (ref 4.0–10.5)
nRBC: 0 % (ref 0.0–0.2)

## 2021-10-07 LAB — COMPREHENSIVE METABOLIC PANEL
ALT: 40 U/L (ref 0–44)
AST: 123 U/L — ABNORMAL HIGH (ref 15–41)
Albumin: 3.3 g/dL — ABNORMAL LOW (ref 3.5–5.0)
Alkaline Phosphatase: 171 U/L — ABNORMAL HIGH (ref 38–126)
Anion gap: 14 (ref 5–15)
BUN: <5 mg/dL — ABNORMAL LOW (ref 6–20)
CO2: 21 mmol/L — ABNORMAL LOW (ref 22–32)
Calcium: 8 mg/dL — ABNORMAL LOW (ref 8.9–10.3)
Chloride: 103 mmol/L (ref 98–111)
Creatinine, Ser: 0.39 mg/dL — ABNORMAL LOW (ref 0.44–1.00)
GFR, Estimated: >60 mL/min (ref >60)
Glucose, Bld: 84 mg/dL (ref 70–99)
Potassium: 3.5 mmol/L (ref 3.5–5.1)
Sodium: 138 mmol/L (ref 135–145)
Total Bilirubin: 0.8 mg/dL (ref 0.3–1.2)
Total Protein: 7.3 g/dL (ref 6.5–8.1)

## 2021-10-07 LAB — TYPE AND SCREEN
ABO/RH(D): B POS
Antibody Screen: NEGATIVE

## 2021-10-07 NOTE — ED Triage Notes (Signed)
Pt BIB [facility] from home, c/o generalized abdominal pain and rectal bleeding. Pt reports that she has a hx of liver disease and has been drinking tonight.

## 2021-10-08 LAB — URINALYSIS, ROUTINE W REFLEX MICROSCOPIC
Bilirubin Urine: NEGATIVE
Glucose, UA: NEGATIVE mg/dL
Ketones, ur: NEGATIVE mg/dL
Nitrite: NEGATIVE
Protein, ur: NEGATIVE mg/dL
Specific Gravity, Urine: 1.009 (ref 1.005–1.030)
pH: 5 (ref 5.0–8.0)

## 2021-10-08 LAB — LIPASE, BLOOD: Lipase: 20 U/L (ref 11–51)

## 2021-10-08 MED ORDER — KETOROLAC TROMETHAMINE 60 MG/2ML IM SOLN
15.0000 mg | Freq: Once | INTRAMUSCULAR | Status: AC
  Administered 2021-10-08: 15 mg via INTRAMUSCULAR
  Filled 2021-10-08: qty 2

## 2021-10-08 MED ORDER — SODIUM CHLORIDE 0.9 % IV BOLUS: 1000.0000 mL | Freq: Once | INTRAVENOUS | Status: DC

## 2021-10-08 MED ORDER — KETOROLAC TROMETHAMINE 15 MG/ML IJ SOLN
7.5000 mg | Freq: Once | INTRAMUSCULAR | Status: DC
  Filled 2021-10-08: qty 1

## 2021-10-08 MED ORDER — ONDANSETRON HCL 4 MG/2ML IJ SOLN
4.0000 mg | Freq: Once | INTRAMUSCULAR | Status: DC
  Filled 2021-10-08: qty 2

## 2021-10-08 MED ORDER — PROCHLORPERAZINE EDISYLATE 10 MG/2ML IJ SOLN
5.0000 mg | Freq: Once | INTRAMUSCULAR | Status: AC
  Administered 2021-10-08: 5 mg via INTRAMUSCULAR
  Filled 2021-10-08: qty 2

## 2021-10-08 NOTE — ED Provider Notes (Signed)
Assumed care from Dr. Leonette Monarch at 7 AM.  Patient's lab work is reassuring.  UA without findings consistent with UTI.  Some contamination of the UA.  Patient has had no vomiting here.  She is tolerating p.o.'s.  she denies pain at this time.  She has a scheduled CT on Friday but at this time does not have pain to indicate CT. patient has nausea medication at home as needed.  At this time she would like to go home.   Blanchie Dessert, MD 10/08/21 1017

## 2021-10-08 NOTE — Discharge Instructions (Addendum)
Continue your current medications.  Use the nausea medicine as needed.  Follow-up with your CAT scan on Friday as planned.  Thankfully your labs today were normal.  With normal liver, pancreas and kidneys.  Make sure you are staying hydrated.  Limit alcohol.  Return to the emergency room if you start having repetitive vomiting and cannot hold anything down, fever, excruciating abdominal pain.

## 2021-10-08 NOTE — ED Notes (Signed)
Completed pt orthostatic vital signs, pt complained of lightheadedness upon standing

## 2021-10-08 NOTE — ED Provider Notes (Signed)
[facility] EMERGENCY DEPARTMENT Provider Note  CSN: [id number] Arrival date & time: 10/07/21 2014  Chief Complaint(s) Abdominal Pain  HPI Brandy Bishop is a 61 y.o. female     Abdominal Pain Pain location:  Epigastric, RUQ and LUQ Pain quality: aching   Pain radiates to:  R flank and L flank Pain severity:  Moderate Onset quality:  Gradual Duration:  3 days Timing:  Constant Progression:  Worsening Context: alcohol use   Relieved by:  Nothing Worsened by:  Nothing Associated symptoms: dysuria, nausea and vomiting   Associated symptoms: no chest pain, no constipation, no cough, no diarrhea, no fatigue, no fever and no shortness of breath   Risk factors: alcohol abuse and being elderly    Reports BRBPR several days ago that resolved.  Past Medical History Past Medical History:  Diagnosis Date   Alcoholism ([place]) 2011   Asthma    Chronic lower back pain    Colitis 04/2014.   Bloody diarrhea   Daily headache    GIB (gastrointestinal bleeding)    Hypertension    Pancreatitis    Chronic pancreatitis noted on CT scan in 04/2014.   Polysubstance abuse ([place]) 2011   Pyelonephritis 05/2012   Seizures ([place])    last seizure was 04/2019 per patient    Patient Active Problem List   Diagnosis Date Noted   Colon cancer screening 08/10/2020   Acute liver failure 06/08/2020   Acute GI bleeding 09/08/2019   Polysubstance abuse ([place]) 09/08/2019   Gastroesophageal reflux disease with esophagitis    Seizure ([place]) 05/30/2015   Cough    Subtherapeutic phenytoin level    Acute upper GI bleed    Hypomagnesemia 04/16/2014   Hypokalemia 04/16/2014   Thrombocytopenia ([place]) 04/16/2014   Liver disease 04/15/2014   ETOH abuse 04/15/2014   Chronic pancreatitis ([place]) 04/15/2014   Protein-calorie malnutrition, severe ([place]) 04/15/2014   Severe protein-calorie malnutrition ([place]) 04/15/2014   Bleeding gastrointestinal    Abnormal CT scan, colon    Rectal bleeding     Abdominal pain    Alcoholic hepatitis without ascites    GI bleed 04/14/2014   Viral URI with cough 05/07/2012   Pyelonephritis 05/07/2012   Seizure disorder ([place]) 05/07/2012   Asthma with bronchitis 05/07/2012   HTN (hypertension) 05/07/2012   Home Medication(s) Prior to Admission medications   Medication Sig Start Date End Date Taking? Authorizing Provider  amLODipine (NORVASC) 10 MG tablet Take 10 mg by mouth daily. 08/13/19   [provider]  cetirizine (ZYRTEC) 10 MG tablet Take 10 mg by mouth daily. 12/28/19   [provider]  ferrous sulfate 324 MG TBEC Take 324 mg by mouth daily with breakfast.    [provider]  folic acid (FOLVITE) 1 MG tablet Take 1 tablet (1 mg total) by mouth daily. 09/10/19   Nolberto Hanlon, MD  hydrochlorothiazide (HYDRODIURIL) 25 MG tablet Take 25 mg by mouth daily. 12/28/19   [provider]  omeprazole (PRILOSEC) 20 MG capsule Take 1 capsule (20 mg total) by mouth daily. 10/12/20 11/11/20  Dorie Rank, MD  thiamine 100 MG tablet Take 1 tablet (100 mg total) by mouth daily. 09/10/19   Nolberto Hanlon, MD  traZODone (DESYREL) 50 MG tablet Take 50 mg by mouth at bedtime. 12/28/19   [provider]  triamcinolone ointment (KENALOG) 0.1 % Apply 1 application topically 2 (two) times daily as needed (rash).  05/20/19   [provider]  Allergies Penicillins  Review of Systems Review of Systems  Constitutional:  Negative for fatigue and fever.  Respiratory:  Negative for cough and shortness of breath.   Cardiovascular:  Negative for chest pain.  Gastrointestinal:  Positive for abdominal pain, nausea and vomiting. Negative for constipation and diarrhea.  Genitourinary:  Positive for dysuria.   As noted in HPI  Physical Exam Vital Signs  I have reviewed the triage vital signs BP (!)  133/106   Pulse 80   Temp 98.6 F (37 C)   Resp 16   SpO2 100%   Physical Exam Vitals reviewed.  Constitutional:      General: She is not in acute distress.    Appearance: She is well-developed and underweight. She is not diaphoretic.  HENT:     Head: Normocephalic and atraumatic.     Right Ear: External ear normal.     Left Ear: External ear normal.     Nose: Nose normal.  Eyes:     General: No scleral icterus.    Conjunctiva/sclera: Conjunctivae normal.  Neck:     Trachea: Phonation normal.  Cardiovascular:     Rate and Rhythm: Normal rate and regular rhythm.  Pulmonary:     Effort: Pulmonary effort is normal. No respiratory distress.     Breath sounds: No stridor.  Abdominal:     General: There is no distension.     Tenderness: There is abdominal tenderness in the right upper quadrant, epigastric area and left upper quadrant. There is right CVA tenderness and left CVA tenderness.  Musculoskeletal:        General: Normal range of motion.     Cervical back: Normal range of motion.  Neurological:     Mental Status: She is alert and oriented to person, place, and time.  Psychiatric:        Behavior: Behavior normal.     ED Results and Treatments Labs (all labs ordered are listed, but only abnormal results are displayed) Labs Reviewed  COMPREHENSIVE METABOLIC PANEL - Abnormal; Notable for the following components:      Result Value   CO2 21 (*)    BUN <5 (*)    Creatinine, Ser 0.39 (*)    Calcium 8.0 (*)    Albumin 3.3 (*)    AST 123 (*)    Alkaline Phosphatase 171 (*)    All other components within normal limits  CBC - Abnormal; Notable for the following components:   WBC 3.0 (*)    RBC 2.82 (*)    Hemoglobin 9.7 (*)    HCT 28.4 (*)    MCV 100.7 (*)    MCH 34.4 (*)    Platelets 74 (*)    All other components within normal limits  LIPASE, BLOOD  PROTIME-INR  URINALYSIS, ROUTINE W REFLEX MICROSCOPIC  POC OCCULT BLOOD, ED  TYPE AND SCREEN  EKG  EKG Interpretation  Date/Time:    Ventricular Rate:    PR Interval:    QRS Duration:   QT Interval:    QTC Calculation:   R Axis:     Text Interpretation:         Radiology No results found.  Medications Ordered in ED Medications  prochlorperazine (COMPAZINE) injection 5 mg (5 mg Intramuscular Given 10/08/21 0656)  ketorolac (TORADOL) injection 15 mg (15 mg Intramuscular Given 10/08/21 0654)                                                                                                                                     Procedures Procedures  (including critical care time)  Medical Decision Making / ED Course   Medical Decision Making Amount and/or Complexity of Data Reviewed Labs: ordered. Decision-making details documented in ED Course.  Risk Prescription drug management.    Upper abdominal discomfort with bilateral flank TTP. We will assess for pancreatitis, biliary disease.  We will also assess for UTI/pyelonephritis.  CBC without leukocytosis.  Stable hemoglobin. Metabolic panel without significant electrolyte derangement or renal sufficiency. Mildly elevated LFTs without evidence of biliary obstruction.  No pancreatitis. Low suspicion for other serious intra-abdominal inflammatory/infectious process requiring imaging at this time.  Pending UA. Given IM toradol and compazine  Patient care turned over to oncoming provider. Patient case and results discussed in detail; please see their note for further ED managment.      Final Clinical Impression(s) / ED Diagnoses Final diagnoses:  None           This chart was dictated using voice recognition software.  Despite best efforts to proofread,  errors can occur which can change the documentation meaning.    Fatima Blank, MD 10/08/21 [id number]

## 2021-10-12 ENCOUNTER — Inpatient Hospital Stay: Admission: RE | Admit: 2021-10-12 | Payer: Medicaid Other | Source: Ambulatory Visit

## 2021-11-02 ENCOUNTER — Ambulatory Visit: Payer: Medicaid Other

## 2021-11-20 ENCOUNTER — Inpatient Hospital Stay: Admission: RE | Admit: 2021-11-20 | Payer: Medicaid Other | Source: Ambulatory Visit

## 2022-02-05 ENCOUNTER — Encounter (HOSPITAL_COMMUNITY): Payer: Self-pay

## 2022-02-05 ENCOUNTER — Emergency Department (HOSPITAL_COMMUNITY): Payer: Medicaid Other

## 2022-02-05 ENCOUNTER — Other Ambulatory Visit: Payer: Self-pay

## 2022-02-05 ENCOUNTER — Emergency Department (HOSPITAL_COMMUNITY)
Admission: EM | Admit: 2022-02-05 | Discharge: 2022-02-06 | Discharge status: Against Medical Advice | Payer: Medicaid Other | Attending: Emergency Medicine | Admitting: Emergency Medicine

## 2022-02-05 DIAGNOSIS — R569 Unspecified convulsions: Secondary | ICD-10-CM | POA: Insufficient documentation

## 2022-02-05 DIAGNOSIS — M25511 Pain in right shoulder: Secondary | ICD-10-CM | POA: Insufficient documentation

## 2022-02-05 DIAGNOSIS — Z5321 Procedure and treatment not carried out due to patient leaving prior to being seen by health care provider: Secondary | ICD-10-CM | POA: Diagnosis not present

## 2022-02-05 LAB — CBC WITH DIFFERENTIAL/PLATELET
Abs Immature Granulocytes: 0.03 10*3/uL (ref 0.00–0.07)
Basophils Absolute: 0.1 10*3/uL (ref 0.0–0.1)
Basophils Relative: 1 %
Eosinophils Absolute: 0 10*3/uL (ref 0.0–0.5)
Eosinophils Relative: 1 %
HCT: 35.3 % — ABNORMAL LOW (ref 36.0–46.0)
Hemoglobin: 12.1 g/dL (ref 12.0–15.0)
Immature Granulocytes: 1 %
Lymphocytes Relative: 49 %
Lymphs Abs: 2.4 10*3/uL (ref 0.7–4.0)
MCH: 34 pg (ref 26.0–34.0)
MCHC: 34.3 g/dL (ref 30.0–36.0)
MCV: 99.2 fL (ref 80.0–100.0)
Monocytes Absolute: 0.3 10*3/uL (ref 0.1–1.0)
Monocytes Relative: 7 %
Neutro Abs: 2 10*3/uL (ref 1.7–7.7)
Neutrophils Relative %: 41 %
Platelets: 77 10*3/uL — ABNORMAL LOW (ref 150–400)
RBC: 3.56 MIL/uL — ABNORMAL LOW (ref 3.87–5.11)
RDW: 13.3 % (ref 11.5–15.5)
WBC: 4.8 10*3/uL (ref 4.0–10.5)
nRBC: 0 % (ref 0.0–0.2)

## 2022-02-05 LAB — COMPREHENSIVE METABOLIC PANEL
ALT: 28 U/L (ref 0–44)
AST: 100 U/L — ABNORMAL HIGH (ref 15–41)
Albumin: 3.2 g/dL — ABNORMAL LOW (ref 3.5–5.0)
Alkaline Phosphatase: 232 U/L — ABNORMAL HIGH (ref 38–126)
Anion gap: 11 (ref 5–15)
BUN: 9 mg/dL (ref 6–20)
CO2: 20 mmol/L — ABNORMAL LOW (ref 22–32)
Calcium: 7.7 mg/dL — ABNORMAL LOW (ref 8.9–10.3)
Chloride: 106 mmol/L (ref 98–111)
Creatinine, Ser: 0.47 mg/dL (ref 0.44–1.00)
GFR, Estimated: >60 mL/min (ref >60)
Glucose, Bld: 83 mg/dL (ref 70–99)
Potassium: 3.5 mmol/L (ref 3.5–5.1)
Sodium: 137 mmol/L (ref 135–145)
Total Bilirubin: 0.8 mg/dL (ref 0.3–1.2)
Total Protein: 7.5 g/dL (ref 6.5–8.1)

## 2022-02-05 LAB — ETHANOL: Alcohol, Ethyl (B): 415 mg/dL (ref <10)

## 2022-02-05 MED ORDER — LORAZEPAM 2 MG/ML IJ SOLN
INTRAMUSCULAR | Status: AC
  Administered 2022-02-05: 2 mg via INTRAMUSCULAR
  Filled 2022-02-05: qty 1

## 2022-02-05 MED ORDER — LORAZEPAM 2 MG/ML IJ SOLN
2.0000 mg | Freq: Once | INTRAMUSCULAR | Status: AC
Start: 2022-02-05 — End: 2022-02-05

## 2022-02-05 NOTE — ED Provider Triage Note (Signed)
Emergency Medicine Provider Triage Evaluation Note  Brandy Bishop , a 62 y.o. female  was evaluated in triage.  Pt complains of seizures. EMS reported that patient stated she had been drinking heavily prior to unwitnessed seziures.  Complaining of right shoulder pain.  Unsure of what anticoagulant medications she had previously been taking.  Denies chest pain, shortness of breath, abdominal pain, nausea, vomiting.  Review of Systems  Positive: As above Negative: As above  Physical Exam  BP (!) 151/93   Pulse 78   Temp (!) 97.5 F (36.4 C) (Oral)   Resp 16   SpO2 98%  Gen:   Awake but poor historian and difficult to cooperate Resp:  Normal effort, no wheezing MSK:   Moves extremities without difficulty Other:    Medical Decision Making  Medically screening exam initiated at 9:33 PM.  Appropriate orders placed.  [name] was informed that the remainder of the evaluation will be completed by another provider, this initial triage assessment does not replace that evaluation, and the importance of remaining in the ED until their evaluation is complete.     Luvenia Heller, PA-C 02/05/22 2138

## 2022-02-05 NOTE — ED Triage Notes (Signed)
Arrives EMS from home after drinking all day. 2 non visualized seizures reported.   Says she has not taken any meds in a long time.

## 2022-02-06 ENCOUNTER — Emergency Department (HOSPITAL_COMMUNITY): Payer: Medicaid Other

## 2022-02-06 NOTE — ED Notes (Signed)
Been called multiple times and no response

## 2022-03-21 ENCOUNTER — Other Ambulatory Visit: Payer: Self-pay | Admitting: Physician Assistant

## 2022-03-21 DIAGNOSIS — Z1231 Encounter for screening mammogram for malignant neoplasm of breast: Secondary | ICD-10-CM

## 2022-06-30 ENCOUNTER — Other Ambulatory Visit: Payer: Self-pay

## 2022-06-30 ENCOUNTER — Inpatient Hospital Stay (HOSPITAL_COMMUNITY)
Admission: EM | Admit: 2022-06-30 | Discharge: 2022-07-02 | DRG: 194 | Disposition: A | Discharge status: Home / Self Care | Payer: Medicaid Other | Attending: Family Medicine | Admitting: Family Medicine

## 2022-06-30 ENCOUNTER — Emergency Department (HOSPITAL_COMMUNITY): Payer: Medicaid Other

## 2022-06-30 ENCOUNTER — Encounter (HOSPITAL_COMMUNITY): Payer: Self-pay | Admitting: Emergency Medicine

## 2022-06-30 DIAGNOSIS — E876 Hypokalemia: Secondary | ICD-10-CM | POA: Diagnosis present

## 2022-06-30 DIAGNOSIS — Z79899 Other long term (current) drug therapy: Secondary | ICD-10-CM

## 2022-06-30 DIAGNOSIS — E8729 Other acidosis: Secondary | ICD-10-CM | POA: Diagnosis present

## 2022-06-30 DIAGNOSIS — J189 Pneumonia, unspecified organism: Principal | ICD-10-CM | POA: Diagnosis present

## 2022-06-30 DIAGNOSIS — D6959 Other secondary thrombocytopenia: Secondary | ICD-10-CM | POA: Diagnosis present

## 2022-06-30 DIAGNOSIS — K7030 Alcoholic cirrhosis of liver without ascites: Secondary | ICD-10-CM | POA: Diagnosis present

## 2022-06-30 DIAGNOSIS — E871 Hypo-osmolality and hyponatremia: Secondary | ICD-10-CM | POA: Diagnosis present

## 2022-06-30 DIAGNOSIS — E441 Mild protein-calorie malnutrition: Secondary | ICD-10-CM | POA: Diagnosis present

## 2022-06-30 DIAGNOSIS — Z880 Allergy status to penicillin: Secondary | ICD-10-CM

## 2022-06-30 DIAGNOSIS — F1010 Alcohol abuse, uncomplicated: Secondary | ICD-10-CM | POA: Diagnosis present

## 2022-06-30 DIAGNOSIS — K7010 Alcoholic hepatitis without ascites: Secondary | ICD-10-CM | POA: Diagnosis present

## 2022-06-30 DIAGNOSIS — I10 Essential (primary) hypertension: Secondary | ICD-10-CM | POA: Diagnosis present

## 2022-06-30 DIAGNOSIS — Z91199 Patient's noncompliance with other medical treatment and regimen due to unspecified reason: Secondary | ICD-10-CM

## 2022-06-30 DIAGNOSIS — J45909 Unspecified asthma, uncomplicated: Secondary | ICD-10-CM | POA: Diagnosis present

## 2022-06-30 DIAGNOSIS — K760 Fatty (change of) liver, not elsewhere classified: Secondary | ICD-10-CM | POA: Diagnosis present

## 2022-06-30 DIAGNOSIS — Z681 Body mass index (BMI) 19 or less, adult: Secondary | ICD-10-CM

## 2022-06-30 DIAGNOSIS — D649 Anemia, unspecified: Secondary | ICD-10-CM | POA: Diagnosis present

## 2022-06-30 LAB — APTT: aPTT: 28 seconds (ref 24–36)

## 2022-06-30 LAB — URINALYSIS, W/ REFLEX TO CULTURE (INFECTION SUSPECTED)
Bacteria, UA: NONE SEEN
Bilirubin Urine: NEGATIVE
Glucose, UA: NEGATIVE mg/dL
Ketones, ur: 20 mg/dL — AB
Nitrite: NEGATIVE
Protein, ur: NEGATIVE mg/dL
Specific Gravity, Urine: 1.018 (ref 1.005–1.030)
pH: 6 (ref 5.0–8.0)

## 2022-06-30 LAB — COMPREHENSIVE METABOLIC PANEL
ALT: 31 U/L (ref 0–44)
AST: 122 U/L — ABNORMAL HIGH (ref 15–41)
Albumin: 3.3 g/dL — ABNORMAL LOW (ref 3.5–5.0)
Alkaline Phosphatase: 223 U/L — ABNORMAL HIGH (ref 38–126)
Anion gap: 23 — ABNORMAL HIGH (ref 5–15)
BUN: 6 mg/dL — ABNORMAL LOW (ref 8–23)
CO2: 20 mmol/L — ABNORMAL LOW (ref 22–32)
Calcium: 7.3 mg/dL — ABNORMAL LOW (ref 8.9–10.3)
Chloride: 90 mmol/L — ABNORMAL LOW (ref 98–111)
Creatinine, Ser: 0.83 mg/dL (ref 0.44–1.00)
GFR, Estimated: >60 mL/min (ref >60)
Glucose, Bld: 135 mg/dL — ABNORMAL HIGH (ref 70–99)
Potassium: 3.3 mmol/L — ABNORMAL LOW (ref 3.5–5.1)
Sodium: 133 mmol/L — ABNORMAL LOW (ref 135–145)
Total Bilirubin: 4.1 mg/dL — ABNORMAL HIGH (ref 0.3–1.2)
Total Protein: 8.5 g/dL — ABNORMAL HIGH (ref 6.5–8.1)

## 2022-06-30 LAB — RAPID URINE DRUG SCREEN, HOSP PERFORMED
Amphetamines: NOT DETECTED
Barbiturates: NOT DETECTED
Benzodiazepines: NOT DETECTED
Cocaine: NOT DETECTED
Opiates: POSITIVE — AB
Tetrahydrocannabinol: NOT DETECTED

## 2022-06-30 LAB — HEPATITIS PANEL, ACUTE
HCV Ab: NONREACTIVE
Hep A IgM: NONREACTIVE
Hep B C IgM: NONREACTIVE
Hepatitis B Surface Ag: NONREACTIVE

## 2022-06-30 LAB — CBC WITH DIFFERENTIAL/PLATELET
Abs Immature Granulocytes: 0.03 10*3/uL (ref 0.00–0.07)
Basophils Absolute: 0.1 10*3/uL (ref 0.0–0.1)
Basophils Relative: 1 %
Eosinophils Absolute: 0 10*3/uL (ref 0.0–0.5)
Eosinophils Relative: 0 %
HCT: 33.4 % — ABNORMAL LOW (ref 36.0–46.0)
Hemoglobin: 11.6 g/dL — ABNORMAL LOW (ref 12.0–15.0)
Immature Granulocytes: 0 %
Lymphocytes Relative: 8 %
Lymphs Abs: 0.6 10*3/uL — ABNORMAL LOW (ref 0.7–4.0)
MCH: 33.4 pg (ref 26.0–34.0)
MCHC: 34.7 g/dL (ref 30.0–36.0)
MCV: 96.3 fL (ref 80.0–100.0)
Monocytes Absolute: 0.9 10*3/uL (ref 0.1–1.0)
Monocytes Relative: 12 %
Neutro Abs: 6.2 10*3/uL (ref 1.7–7.7)
Neutrophils Relative %: 79 %
Platelets: 75 10*3/uL — ABNORMAL LOW (ref 150–400)
RBC: 3.47 MIL/uL — ABNORMAL LOW (ref 3.87–5.11)
RDW: 13.9 % (ref 11.5–15.5)
WBC: 7.8 10*3/uL (ref 4.0–10.5)
nRBC: 0 % (ref 0.0–0.2)

## 2022-06-30 LAB — ETHANOL: Alcohol, Ethyl (B): <10 mg/dL (ref <10)

## 2022-06-30 LAB — PROTIME-INR
INR: 1.2 (ref 0.8–1.2)
Prothrombin Time: 15.6 seconds — ABNORMAL HIGH (ref 11.4–15.2)

## 2022-06-30 LAB — BILIRUBIN, DIRECT: Bilirubin, Direct: 1.4 mg/dL — ABNORMAL HIGH (ref 0.0–0.2)

## 2022-06-30 LAB — LACTIC ACID, PLASMA
Lactic Acid, Venous: 1.6 mmol/L (ref 0.5–1.9)
Lactic Acid, Venous: 1.5 mmol/L (ref 0.5–1.9)

## 2022-06-30 LAB — HIV ANTIBODY (ROUTINE TESTING W REFLEX): HIV Screen 4th Generation wRfx: NONREACTIVE

## 2022-06-30 MED ORDER — ENOXAPARIN SODIUM 40 MG/0.4ML IJ SOSY
40.0000 mg | PREFILLED_SYRINGE | INTRAMUSCULAR | Status: DC
  Administered 2022-06-30: 40 mg via SUBCUTANEOUS
  Filled 2022-06-30: qty 0.4

## 2022-06-30 MED ORDER — LISINOPRIL 10 MG PO TABS: 40.0000 mg | ORAL_TABLET | Freq: Every day | ORAL | Status: DC

## 2022-06-30 MED ORDER — SODIUM CHLORIDE 0.9 % IV SOLN
500.0000 mg | INTRAVENOUS | Status: DC
  Administered 2022-06-30: 500 mg via INTRAVENOUS
  Filled 2022-06-30 (×2): qty 5

## 2022-06-30 MED ORDER — SODIUM CHLORIDE 0.9 % IV SOLN
2.0000 g | INTRAVENOUS | Status: DC
  Administered 2022-06-30 – 2022-07-01 (×2): 2 g via INTRAVENOUS
  Filled 2022-06-30 (×2): qty 20

## 2022-06-30 MED ORDER — THIAMINE HCL 100 MG PO TABS: 100.0000 mg | ORAL_TABLET | Freq: Every day | ORAL | Status: DC

## 2022-06-30 MED ORDER — POLYETHYLENE GLYCOL 3350 17 G PO PACK: 17.0000 g | PACK | Freq: Every day | ORAL | Status: DC | PRN

## 2022-06-30 MED ORDER — METOPROLOL TARTRATE 5 MG/5ML IV SOLN: 5.0000 mg | Freq: Four times a day (QID) | INTRAVENOUS | Status: DC | PRN

## 2022-06-30 MED ORDER — SODIUM CHLORIDE 0.9 % IV SOLN: INTRAVENOUS | Status: DC

## 2022-06-30 MED ORDER — PANTOPRAZOLE SODIUM 40 MG PO TBEC
40.0000 mg | DELAYED_RELEASE_TABLET | Freq: Every day | ORAL | Status: DC
  Administered 2022-06-30 – 2022-07-02 (×3): 40 mg via ORAL
  Filled 2022-06-30 (×3): qty 1

## 2022-06-30 MED ORDER — ONDANSETRON HCL 4 MG/2ML IJ SOLN: 4.0000 mg | Freq: Four times a day (QID) | INTRAMUSCULAR | Status: DC | PRN

## 2022-06-30 MED ORDER — ALBUTEROL SULFATE (2.5 MG/3ML) 0.083% IN NEBU: 2.5000 mg | INHALATION_SOLUTION | RESPIRATORY_TRACT | Status: DC | PRN

## 2022-06-30 MED ORDER — AMLODIPINE BESYLATE 5 MG PO TABS
10.0000 mg | ORAL_TABLET | Freq: Every day | ORAL | Status: DC
  Administered 2022-06-30 – 2022-07-02 (×3): 10 mg via ORAL
  Filled 2022-06-30 (×3): qty 2

## 2022-06-30 MED ORDER — THIAMINE MONONITRATE 100 MG PO TABS
100.0000 mg | ORAL_TABLET | Freq: Every day | ORAL | Status: DC
  Administered 2022-06-30 – 2022-07-02 (×3): 100 mg via ORAL
  Filled 2022-06-30 (×3): qty 1

## 2022-06-30 MED ORDER — DOCUSATE SODIUM 100 MG PO CAPS
100.0000 mg | ORAL_CAPSULE | Freq: Two times a day (BID) | ORAL | Status: DC
  Administered 2022-06-30 – 2022-07-02 (×4): 100 mg via ORAL
  Filled 2022-06-30 (×4): qty 1

## 2022-06-30 MED ORDER — ONDANSETRON HCL 4 MG PO TABS: 4.0000 mg | ORAL_TABLET | Freq: Four times a day (QID) | ORAL | Status: DC | PRN

## 2022-06-30 MED ORDER — FERROUS SULFATE 325 (65 FE) MG PO TABS
324.0000 mg | ORAL_TABLET | Freq: Every day | ORAL | Status: DC
  Administered 2022-07-01 – 2022-07-02 (×2): 324 mg via ORAL
  Filled 2022-06-30 (×2): qty 1

## 2022-06-30 MED ORDER — ENOXAPARIN SODIUM 30 MG/0.3ML IJ SOSY
30.0000 mg | PREFILLED_SYRINGE | INTRAMUSCULAR | Status: DC
  Administered 2022-07-01: 30 mg via SUBCUTANEOUS
  Filled 2022-06-30: qty 0.3

## 2022-06-30 MED ORDER — IBUPROFEN 200 MG PO TABS
400.0000 mg | ORAL_TABLET | Freq: Four times a day (QID) | ORAL | Status: DC | PRN
  Administered 2022-06-30: 400 mg via ORAL
  Filled 2022-06-30: qty 2

## 2022-06-30 MED ORDER — SODIUM CHLORIDE 0.9 % IV BOLUS (SEPSIS)
1000.0000 mL | Freq: Once | INTRAVENOUS | Status: AC
  Administered 2022-06-30: 1000 mL via INTRAVENOUS

## 2022-06-30 MED ORDER — ONDANSETRON HCL 4 MG/2ML IJ SOLN
4.0000 mg | Freq: Once | INTRAMUSCULAR | Status: AC
  Administered 2022-06-30: 4 mg via INTRAVENOUS
  Filled 2022-06-30: qty 2

## 2022-06-30 MED ORDER — MIRTAZAPINE 7.5 MG PO TABS
7.5000 mg | ORAL_TABLET | Freq: Every day | ORAL | Status: DC
  Administered 2022-06-30 – 2022-07-01 (×2): 7.5 mg via ORAL
  Filled 2022-06-30 (×2): qty 1

## 2022-06-30 MED ORDER — LOSARTAN POTASSIUM-HCTZ 50-12.5 MG PO TABS: 1.0000 | ORAL_TABLET | Freq: Every day | ORAL | Status: DC

## 2022-06-30 MED ORDER — OXYCODONE HCL 5 MG PO TABS: 5.0000 mg | ORAL_TABLET | ORAL | Status: DC | PRN

## 2022-06-30 MED ORDER — LOSARTAN POTASSIUM 50 MG PO TABS
50.0000 mg | ORAL_TABLET | Freq: Every day | ORAL | Status: DC
  Administered 2022-06-30 – 2022-07-02 (×3): 50 mg via ORAL
  Filled 2022-06-30 (×3): qty 1

## 2022-06-30 MED ORDER — MORPHINE SULFATE (PF) 4 MG/ML IV SOLN
4.0000 mg | Freq: Once | INTRAVENOUS | Status: AC
  Administered 2022-06-30: 4 mg via INTRAVENOUS
  Filled 2022-06-30: qty 1

## 2022-06-30 MED ORDER — IOHEXOL 300 MG/ML  SOLN
80.0000 mL | Freq: Once | INTRAMUSCULAR | Status: AC | PRN
  Administered 2022-06-30: 80 mL via INTRAVENOUS

## 2022-06-30 NOTE — H&P (Signed)
History and Physical  Brandy Bishop:[id number] DOB: January 12, 1961 DOA: 06/30/2022  PCP: Cline Crock, NP   Chief Complaint: Right-sided abdominal pain  HPI: Brandy Bishop is a 62 y.o. female with medical history significant for polysubstance abuse, prior heavy alcohol abuse, hypertension, hepatic steatosis being admitted to the hospital with community-acquired pneumonia and hyperbilirubinemia.  Patient states that she has been having right upper quadrant and right lower quadrant abdominal pain for about the last 5 to 7 days.  There is some associated nausea.  Denies fevers or chills or diarrhea.  Says that she used to drink liquor in the past, but has cut back on her alcohol intake, now she drinks some beer occasionally.  She last drank alcohol last night, had 2 beers.  Review of systems, she has also had a persistent nonproductive cough for the last 3 or 4 days, denies feeling particularly short of breath except when she tries to ambulate.  ED Course: In the ER she is initially tachycardic, but this is now resolved.  Blood pressure elevated 160s over 90, saturating 100% on room air.  Lab work was done and reveals normal WBC, stable hemoglobin 12 (12 in January 2024), platelets 75,000 (stable.  Sodium 133, potassium 3.3, creatinine 0.83, AST 122/ALT 31/total bilirubin 4.1.  Review of Systems: Please see HPI for pertinent positives and negatives. A complete 10 system review of systems are otherwise negative.  Past Medical History:  Diagnosis Date   Alcoholism ([facility]) 2011   Asthma    Chronic lower back pain    Colitis 04/2014.   Bloody diarrhea   Daily headache    GIB (gastrointestinal bleeding)    Hypertension    Pancreatitis    Chronic pancreatitis noted on CT scan in 04/2014.   Polysubstance abuse ([facility]) 2011   Pyelonephritis 05/2012   Seizures ([facility])    last seizure was 04/2019 per patient    Past Surgical History:  Procedure Laterality Date   COLONOSCOPY WITH PROPOFOL  N/A 09/09/2019   Procedure: COLONOSCOPY WITH PROPOFOL;  Surgeon: Charlott Rakes, MD;  Location: [facility] ENDOSCOPY;  Service: Endoscopy;  Laterality: N/A;   ESOPHAGOGASTRODUODENOSCOPY N/A 05/31/2015   Procedure: ESOPHAGOGASTRODUODENOSCOPY (EGD);  Surgeon: Hilarie Fredrickson, MD;  Location: [facility] ENDOSCOPY;  Service: Endoscopy;  Laterality: N/A;   ESOPHAGOGASTRODUODENOSCOPY N/A 09/09/2019   Procedure: ESOPHAGOGASTRODUODENOSCOPY (EGD);  Surgeon: Charlott Rakes, MD;  Location: [facility] ENDOSCOPY;  Service: Endoscopy;  Laterality: N/A;   ORIF HUMERUS FRACTURE Right 05/12/2019   Procedure: OPEN REDUCTION INTERNAL FIXATION (ORIF) DISTAL HUMERUS FRACTURE;  Surgeon: Bjorn Pippin, MD;  Location: [facility] ORS;  Service: Orthopedics;  Laterality: Right;    Social History:  reports that she has never smoked. She has never used smokeless tobacco. She reports that she does not currently use alcohol after a past usage of about 10.0 standard drinks of alcohol per week. She reports that she does not use drugs.   Allergies  Allergen Reactions   Penicillins Swelling    .Has patient had a PCN reaction causing immediate rash, facial/tongue/throat swelling, SOB or lightheadedness with hypotension: Yes Has patient had a PCN reaction causing severe rash involving mucus membranes or skin necrosis: No Has patient had a PCN reaction that required hospitalization No Has patient had a PCN reaction occurring within the last 10 years: No If all of the above answers are "NO", then may proceed with Cephalosporin use.     Family History  Problem Relation Age of Onset   Cancer Mother    Cancer  Brother      Prior to Admission medications   Medication Sig Start Date End Date Taking? Authorizing Provider  amLODipine (NORVASC) 10 MG tablet Take 10 mg by mouth daily. 08/13/19   [provider]  cetirizine (ZYRTEC) 10 MG tablet Take 10 mg by mouth daily. 12/28/19   [provider]  ferrous sulfate 324 MG TBEC Take 324 mg by  mouth daily with breakfast.    [provider]  folic acid (FOLVITE) 1 MG tablet Take 1 tablet (1 mg total) by mouth daily. 09/10/19   Lynn Ito, MD  hydrochlorothiazide (HYDRODIURIL) 25 MG tablet Take 25 mg by mouth daily. 12/28/19   [provider]  losartan-hydrochlorothiazide (HYZAAR) 50-12.5 MG tablet Take 1 tablet by mouth daily.    [provider]  mirtazapine (REMERON) 7.5 MG tablet Take 7.5 mg by mouth at bedtime. 04/04/22 10/01/22  [provider]  omeprazole (PRILOSEC) 20 MG capsule Take 1 capsule (20 mg total) by mouth daily. 10/12/20 11/11/20  Linwood Dibbles, MD  ondansetron (ZOFRAN-ODT) 8 MG disintegrating tablet Take 8 mg by mouth 2 (two) times daily as needed for vomiting or nausea. 03/19/22   [provider]  thiamine 100 MG tablet Take 1 tablet (100 mg total) by mouth daily. 09/10/19   Lynn Ito, MD  traZODone (DESYREL) 50 MG tablet Take 50 mg by mouth at bedtime. 12/28/19   [provider]  triamcinolone ointment (KENALOG) 0.1 % Apply 1 application topically 2 (two) times daily as needed (rash).  05/20/19   [provider]    Physical Exam: BP (!) 163/93   Pulse (!) 104   Temp 99.2 F (37.3 C) (Oral)   Resp (!) 24   SpO2 97%   General:  Alert, oriented, calm, in no acute distress, appears weak and dehydrated, somewhat emaciated and malnourished in appearance Eyes: EOMI, clear conjuctivae, sclerea icteric Neck: supple, no masses, trachea mildline  Cardiovascular: RRR, no murmurs or rubs, no peripheral edema  Respiratory: clear to auscultation bilaterally, no wheezes, no crackles  Abdomen: soft, nontender, nondistended, normal bowel tones heard  Skin: dry, no rashes  Musculoskeletal: no joint effusions, normal range of motion  Psychiatric: appropriate affect, normal speech  Neurologic: extraocular muscles intact, clear speech, moving all extremities with intact sensorium          Labs on Admission:  Basic  Metabolic Panel: Recent Labs  Lab 06/30/22 1209  NA 133*  K 3.3*  CL 90*  CO2 20*  GLUCOSE 135*  BUN 6*  CREATININE 0.83  CALCIUM 7.3*   Liver Function Tests: Recent Labs  Lab 06/30/22 1209  AST 122*  ALT 31  ALKPHOS 223*  BILITOT 4.1*  PROT 8.5*  ALBUMIN 3.3*   No results for input(s): "LIPASE", "AMYLASE" in the last 168 hours. No results for input(s): "AMMONIA" in the last 168 hours. CBC: Recent Labs  Lab 06/30/22 1209  WBC 7.8  NEUTROABS 6.2  HGB 11.6*  HCT 33.4*  MCV 96.3  PLT 75*   Cardiac Enzymes: No results for input(s): "CKTOTAL", "CKMB", "CKMBINDEX", "TROPONINI" in the last 168 hours.  BNP (last 3 results) No results for input(s): "BNP" in the last 8760 hours.  ProBNP (last 3 results) No results for input(s): "PROBNP" in the last 8760 hours.  CBG: No results for input(s): "GLUCAP" in the last 168 hours.  Radiological Exams on Admission: CT ABDOMEN PELVIS W CONTRAST  Result Date: 06/30/2022 CLINICAL DATA:  Right lower quadrant abdominal pain. Generalized weakness. EXAM:  CT ABDOMEN AND PELVIS WITH CONTRAST TECHNIQUE: Multidetector CT imaging of the abdomen and pelvis was performed using the standard protocol following bolus administration of intravenous contrast. RADIATION DOSE REDUCTION: This exam was performed according to the departmental dose-optimization program which includes automated exposure control, adjustment of the mA and/or kV according to patient size and/or use of iterative reconstruction technique. CONTRAST:  80mL OMNIPAQUE IOHEXOL 300 MG/ML  SOLN COMPARISON:  CT chest dated October 12, 2020 FINDINGS: Lower chest: Left lower lobe ground-glass opacities concerning for pneumonia. No appreciable pleural effusion or pneumothorax. Hepatobiliary: Diffuse low attenuation of hepatic parenchyma concerning for severe hepatic steatosis. No gallstones, gallbladder wall thickening or biliary dilatation. Pancreas: Generalized pancreatic atrophy with  calcification, likely sequela of chronic pancreatitis. No pancreatic ductal dilatation. Spleen: Normal in size without focal abnormality. Adrenals/Urinary Tract: Adrenal glands are unremarkable. Kidneys are normal, without renal calculi, focal lesion, or hydronephrosis. Bladder is unremarkable. Stomach/Bowel: Stomach is within normal limits. Appendix appears normal. There is marked gaseous distention of the colonic loops without significant wall thickening. Vascular/Lymphatic: Aortic atherosclerosis. No enlarged abdominal or pelvic lymph nodes. Reproductive: Uterus and bilateral adnexa are unremarkable. Other: No abdominal wall hernia or abnormality. No abdominopelvic ascites. Musculoskeletal: Mild multilevel degenerate disc disease of the lumbar spine. No acute osseous abnormality. IMPRESSION: 1. Left lower lobe ground-glass opacities concerning for pneumonia. Follow-up examination to resolution is recommended. 2. Marked gaseous distention of the colonic loops without significant wall thickening. 3. Normal appendix. No evidence of colitis or diverticulitis. 4. Severe hepatic steatosis. 5. Generalized pancreatic atrophy with calcification, likely sequela of chronic pancreatitis. No pancreatic ductal dilatation. Aortic Atherosclerosis (ICD10-I70.0). Electronically Signed   By: Larose Hires D.O.   On: 06/30/2022 13:34    Assessment/Plan This is a 62 year old female with a history of polysubstance abuse, alcohol abuse, prior abnormal LFTs likely due to hepatic steatosis being admitted to the hospital with several days of abdominal pain, cough and subjective shortness of breath found to have community-acquired pneumonia and abnormal LFTs.  CAP (community acquired pneumonia)-she is not septic, hemodynamically stable with normal lactate, on room air -Observation admission -Empiric IV azithromycin and Rocephin -Saturating well on room air, no evidence of hypoxic respiratory failure  Alcohol abuse history-likely  cause of her anemia and thrombocytopenia  Hyponatremia-asymptomatic, will avoid HCTZ, hydrate and recheck in the morning  Abnormal liver function-with elevated AST which continues to gradually climb, and recurrent hyperbilirubinemia.  Given rising LFTs, recurrent bilirubinemia I suspect she may be imbibing more alcohol than she admits to.  She does have a history of fatty liver, no evidence of ductal dilatation on CT scan today -Check acute hepatitis panel -Check direct bilirubin -Discussed with [facility] Dr. Adela Lank, who will consult in the morning  Thrombocytopenia-likely due to alcohol abuse  Hypertension-continue home amlodipine, lisinopril (hold HCTZ due to mild hyponatremia)  DVT prophylaxis: Lovenox     Code Status: Full Code  Consults called: GI  Admission status: Observation   Time spent: 46 minutes  Brandy Hink Sharlette Dense MD [facility] Pager [phone]  If 7PM-7AM, please contact night-coverage [email or web address] Password [facility]  06/30/2022, 3:22 PM

## 2022-06-30 NOTE — ED Triage Notes (Signed)
BIB EMS from home for N/V/D for a few days with generalized weakness. Decreased PO intake for 2 days.

## 2022-06-30 NOTE — ED Provider Notes (Signed)
[place] [facility]ERGENCY DE[facility]MENT AT [facility] Provider Note   CSN: [id number] Arrival date & time: 06/30/22  1110     History  Chief Complaint  Patient presents with   Emesis   Nausea    Brandy Bishop is a 62 y.o. female past medical history of alcohol abuse, substance abuse, hypertension, seizure presents today for evaluation of abdominal pain.  States she has had abdominal pain since Thursday.  The pain is located on the right lower quadrant and right upper quadrant, nonradiating, constant.  She endorses nausea and vomiting.  Denies any urinary symptoms, blood in her stool or urine.  Denies fever, chest pain, shortness of breath, bowel changes.  States she drinks alcohol occasionally.  Last drink was yesterday, drinking 2 cans of beer.   Emesis     Past Medical History:  Diagnosis Date   Alcoholism (HCC) 2011   Asthma    Chronic lower back pain    Colitis 04/2014.   Bloody diarrhea   Daily headache    GIB (gastrointestinal bleeding)    Hypertension    Pancreatitis    Chronic pancreatitis noted on CT scan in 04/2014.   Polysubstance abuse (HCC) 2011   Pyelonephritis 05/2012   Seizures (HCC)    last seizure was 04/2019 per patient    Past Surgical History:  Procedure Laterality Date   COLONOSCOPY WITH PROPOFOL N/A 09/09/2019   Procedure: COLONOSCOPY WITH PROPOFOL;  Surgeon: Charlott Rakes, MD;  Location: [facility] ENDOSCOPY;  Service: Endoscopy;  Laterality: N/A;   ESOPHAGOGASTRODUODENOSCOPY N/A 05/31/2015   Procedure: ESOPHAGOGASTRODUODENOSCOPY (EGD);  Surgeon: Hilarie Fredrickson, MD;  Location: [facility] ENDOSCOPY;  Service: Endoscopy;  Laterality: N/A;   ESOPHAGOGASTRODUODENOSCOPY N/A 09/09/2019   Procedure: ESOPHAGOGASTRODUODENOSCOPY (EGD);  Surgeon: Charlott Rakes, MD;  Location: [facility] ENDOSCOPY;  Service: Endoscopy;  Laterality: N/A;   ORIF HUMERUS FRACTURE Right 05/12/2019   Procedure: OPEN REDUCTION INTERNAL FIXATION (ORIF) DISTAL HUMERUS FRACTURE;  Surgeon:  Bjorn Pippin, MD;  Location: [facility] ORS;  Service: Orthopedics;  Laterality: Right;     Home Medications Prior to Admission medications   Medication Sig Start Date End Date Taking? Authorizing Provider  amLODipine (NORVASC) 10 MG tablet Take 10 mg by mouth daily. 08/13/19   [provider]  cetirizine (ZYRTEC) 10 MG tablet Take 10 mg by mouth daily. 12/28/19   [provider]  ferrous sulfate 324 MG TBEC Take 324 mg by mouth daily with breakfast.    [provider]  folic acid (FOLVITE) 1 MG tablet Take 1 tablet (1 mg total) by mouth daily. 09/10/19   Lynn Ito, MD  hydrochlorothiazide (HYDRODIURIL) 25 MG tablet Take 25 mg by mouth daily. 12/28/19   [provider]  omeprazole (PRILOSEC) 20 MG capsule Take 1 capsule (20 mg total) by mouth daily. 10/12/20 11/11/20  Linwood Dibbles, MD  thiamine 100 MG tablet Take 1 tablet (100 mg total) by mouth daily. 09/10/19   Lynn Ito, MD  traZODone (DESYREL) 50 MG tablet Take 50 mg by mouth at bedtime. 12/28/19   [provider]  triamcinolone ointment (KENALOG) 0.1 % Apply 1 application topically 2 (two) times daily as needed (rash).  05/20/19   [provider]      Allergies    Penicillins    Review of Systems   Review of Systems  Gastrointestinal:  Positive for vomiting.    Physical Exam Updated Vital Signs BP (!) 170/102 (BP Location: Right Arm)   Pulse (!) 130   Temp 99.2  F (37.3 C) (Oral)   Resp 18   SpO2 100%  Physical Exam Vitals and nursing note reviewed.  Constitutional:      Appearance: She is ill-appearing.  HENT:     Head: Normocephalic and atraumatic.     Mouth/Throat:     Mouth: Mucous membranes are moist.  Eyes:     General: Scleral icterus present.  Cardiovascular:     Rate and Rhythm: Normal rate and regular rhythm.     Pulses: Normal pulses.     Heart sounds: Normal heart sounds.  Pulmonary:     Effort: Pulmonary effort is normal.     Breath sounds: Normal breath  sounds.  Abdominal:     General: Abdomen is flat.     Palpations: Abdomen is soft.     Tenderness: There is no abdominal tenderness.  Musculoskeletal:        General: No deformity.  Skin:    General: Skin is warm.     Findings: No rash.  Neurological:     General: No focal deficit present.     Mental Status: She is alert.  Psychiatric:        Mood and Affect: Mood normal.    ED Results / Procedures / Treatments   Labs (all labs ordered are listed, but only abnormal results are displayed) Labs Reviewed  CULTURE, BLOOD (ROUTINE X 2)  CULTURE, BLOOD (ROUTINE X 2)  LACTIC ACID, PLASMA  LACTIC ACID, PLASMA  COMPREHENSIVE METABOLIC PANEL  CBC WITH DIFFERENTIAL/PLATELET  PROTIME-INR  APTT  URINALYSIS, W/ REFLEX TO CULTURE (INFECTION SUSPECTED)  ETHANOL  RAPID URINE DRUG SCREEN, HOSP PERFORMED    EKG EKG Interpretation  Date/Time:  Sunday Jun 30 2022 11:28:18 EDT Ventricular Rate:  123 PR Interval:  153 QRS Duration: 80 QT Interval:  319 QTC Calculation: 457 R Axis:   0 Text Interpretation: Sinus tachycardia Atrial premature complex Confirmed by Alona Bene [phone]) on 06/30/2022 11:33:54 AM  Radiology No results found.  Procedures Procedures    Medications Ordered in ED Medications  sodium chloride 0.9 % bolus 1,000 mL (has no administration in time range)  ondansetron (ZOFRAN) injection 4 mg (has no administration in time range)  morphine (PF) 4 MG/ML injection 4 mg (has no administration in time range)    ED Course/ Medical Decision Making/ A&P                             Medical Decision Making Amount and/or Complexity of Data Reviewed Labs: ordered. Radiology: ordered. ECG/medicine tests: ordered.  Risk Prescription drug management.   This patient presents to the ED for abdominal pain, nausea, vomiting, this involves an extensive number of treatment options, and is a complaint that carries with a high risk of complications and morbidity.  The  differential diagnosis includes PUD, SBO, gastroenteritis, diverticulitis, appendicitis, tuberculosis, pyelonephritis, cystitis, pancreatitis, pneumonia., infectious etiology.  This is not an exhaustive list.  Lab tests: I ordered and personally interpreted labs.  The pertinent results include: WBC unremarkable. Hbg unremarkable. Platelets unremarkable. Electrolytes unremarkable. BUN, creatinine unremarkable.  Total bilirubin 4.1.  Lactic acid 1.6.  UDS positive for opiates.  Imaging studies: I ordered imaging studies. I personally reviewed, interpreted imaging and agree with the radiologist's interpretations. The results include: CT scan concerning for pneumonia.  No evidence of colitis or appendicitis.  Problem list/ ED course/ Critical interventions/ Medical management: HPI: See above Vital signs within normal range and stable throughout visit.  Laboratory/imaging studies significant for: See above. On physical examination, patient is afebrile and appears in no acute distress.  There was tenderness to palpation to right lower quadrant and right upper quadrant.  Scleral icterus noted.  Labs is significant for total bilirubin 4.1.  CBC with no evidence of leukocytosis or anemia.  CMP with no acute electrolyte abnormality.  Lactic acid normal.  CT scan showed pneumonia in the left lower lobe.  Patient is tachycardic and tachypneic.  She she met sepsis criteria.  Low suspicion for other emergent intra-abdominal abnormalities such as appendicitis, SBO or diverticulitis, cholelithiasis or cholecystitis.  Given IV fluid, morphine, Zofran.  Given 2 g Rocephin, 500 mg azithromycin.  Patient will require mission for further evaluation and management of bilirubinemia, pneumonia sepsis.  She is stable at this point.  Heart rate and respiratory rate have improved since arrival. Pain has also improved. I have reviewed the patient home medicines and have made adjustments as needed.  Cardiac monitoring/EKG: The  patient was maintained on a cardiac monitor.  I personally reviewed and interpreted the cardiac monitor which showed an underlying rhythm of: sinus rhythm.  Additional history obtained: External records from outside source obtained and reviewed including: Chart review including previous notes, labs, imaging.  Consultations obtained: I requested consultation with Dr. Erenest Blank [facility] hospitalist, and discussed lab and imaging findings as well as pertinent plan.  He agreed to admit the patient.  Disposition Admit.  This chart was dictated using voice recognition software.  Despite best efforts to proofread,  errors can occur which can change the documentation meaning.          Final Clinical Impression(s) / ED Diagnoses Final diagnoses:  Pneumonia of left lower lobe due to infectious organism  Bilirubinemia    Rx / DC Orders ED Discharge Orders     None         Jeanelle Malling, [place] 06/30/22 1521    Maia Plan, MD 07/02/22 2222

## 2022-06-30 NOTE — ED Notes (Signed)
ED TO INPATIENT HANDOFF REPORT  ED Nurse Name and Phone #: Mariella Blackwelder  S Name/Age/Gender Brandy Bishop 62 y.o. female Room/Bed: WA01/WA01  Code Status   Code Status: Full Code  Home/SNF/Other Home Patient oriented to: self, place, time, and situation Is this baseline? Yes   Triage Complete: Triage complete  Chief Complaint CAP (community acquired pneumonia) [J18.9]  Triage Note BIB EMS from home for N/V/D for a few days with generalized weakness. Decreased PO intake for 2 days.    Allergies Allergies  Allergen Reactions   Penicillins Swelling    .Has patient had a PCN reaction causing immediate rash, facial/tongue/throat swelling, SOB or lightheadedness with hypotension: Yes Has patient had a PCN reaction causing severe rash involving mucus membranes or skin necrosis: No Has patient had a PCN reaction that required hospitalization No Has patient had a PCN reaction occurring within the last 10 years: No If all of the above answers are "NO", then may proceed with Cephalosporin use.     Level of Care/Admitting Diagnosis ED Disposition     ED Disposition  Admit   Condition  --   Comment  Hospital Area: [facility] [[id number]]  Level of Care: Med-Surg [16]  May place patient in observation at [facility] or [facility] if equivalent level of care is available:: Yes  Covid Evaluation: Asymptomatic - no recent exposure (last 10 days) testing not required  Diagnosis: CAP (community acquired pneumonia) [161096]  Admitting Physician: Maryln Gottron [[id number]]  Attending Physician: Kirby Crigler, MIR [email or web address] [[id number]]          B Medical/Surgery History Past Medical History:  Diagnosis Date   Alcoholism (HCC) 2011   Asthma    Chronic lower back pain    Colitis 04/2014.   Bloody diarrhea   Daily headache    GIB (gastrointestinal bleeding)    Hypertension    Pancreatitis    Chronic pancreatitis noted on CT scan in 04/2014.   Polysubstance abuse  (HCC) 2011   Pyelonephritis 05/2012   Seizures (HCC)    last seizure was 04/2019 per patient    Past Surgical History:  Procedure Laterality Date   COLONOSCOPY WITH PROPOFOL N/A 09/09/2019   Procedure: COLONOSCOPY WITH PROPOFOL;  Surgeon: Charlott Rakes, MD;  Location: [facility] ENDOSCOPY;  Service: Endoscopy;  Laterality: N/A;   ESOPHAGOGASTRODUODENOSCOPY N/A 05/31/2015   Procedure: ESOPHAGOGASTRODUODENOSCOPY (EGD);  Surgeon: Hilarie Fredrickson, MD;  Location: [facility] ENDOSCOPY;  Service: Endoscopy;  Laterality: N/A;   ESOPHAGOGASTRODUODENOSCOPY N/A 09/09/2019   Procedure: ESOPHAGOGASTRODUODENOSCOPY (EGD);  Surgeon: Charlott Rakes, MD;  Location: [facility] ENDOSCOPY;  Service: Endoscopy;  Laterality: N/A;   ORIF HUMERUS FRACTURE Right 05/12/2019   Procedure: OPEN REDUCTION INTERNAL FIXATION (ORIF) DISTAL HUMERUS FRACTURE;  Surgeon: Bjorn Pippin, MD;  Location: [facility] ORS;  Service: Orthopedics;  Laterality: Right;     A IV Location/Drains/Wounds Patient Lines/Drains/Airways Status     Active Line/Drains/Airways     Name Placement date Placement time Site Days   Peripheral IV 06/30/22 20 G Anterior;Right;Upper Arm 06/30/22  1209  Arm  less than 1            Intake/Output Last 24 hours No intake or output data in the 24 hours ending 06/30/22 1541  Labs/Imaging Results for orders placed or performed during the hospital encounter of 06/30/22 (from the past 48 hour(s))  Lactic acid, plasma     Status: None   Collection Time: 06/30/22 12:09 PM  Result Value Ref Range   Lactic Acid, Venous  1.6 0.5 - 1.9 mmol/L    Comment: Performed at [facility], [address] [place], [place] [address]  Comprehensive metabolic panel     Status: Abnormal   Collection Time: 06/30/22 12:09 PM  Result Value Ref Range   Sodium 133 (L) 135 - 145 mmol/L   Potassium 3.3 (L) 3.5 - 5.1 mmol/L   Chloride 90 (L) 98 - 111 mmol/L   CO2 20 (L) 22 - 32 mmol/L   Glucose, Bld 135 (H) 70 - 99 mg/dL     Comment: Glucose reference range applies only to samples taken after fasting for at least 8 hours.   BUN 6 (L) 8 - 23 mg/dL   Creatinine, Ser 1.91 0.44 - 1.00 mg/dL   Calcium 7.3 (L) 8.9 - 10.3 mg/dL   Total Protein 8.5 (H) 6.5 - 8.1 g/dL   Albumin 3.3 (L) 3.5 - 5.0 g/dL   AST 478 (H) 15 - 41 U/L   ALT 31 0 - 44 U/L   Alkaline Phosphatase 223 (H) 38 - 126 U/L   Total Bilirubin 4.1 (H) 0.3 - 1.2 mg/dL   GFR, Estimated >29 >56 mL/min    Comment: (NOTE) Calculated using the CKD-EPI Creatinine Equation (2021)    Anion gap 23 (H) 5 - 15    Comment: Performed at [facility], [address] [place], [place] [address]  CBC with Differential     Status: Abnormal   Collection Time: 06/30/22 12:09 PM  Result Value Ref Range   WBC 7.8 4.0 - 10.5 K/uL   RBC 3.47 (L) 3.87 - 5.11 MIL/uL   Hemoglobin 11.6 (L) 12.0 - 15.0 g/dL   HCT 65.7 (L) 84.6 - 96.2 %   MCV 96.3 80.0 - 100.0 fL   MCH 33.4 26.0 - 34.0 pg   MCHC 34.7 30.0 - 36.0 g/dL   RDW 95.2 84.1 - 32.4 %   Platelets 75 (L) 150 - 400 K/uL    Comment: SPECIMEN CHECKED FOR CLOTS Immature Platelet Fraction may be clinically indicated, consider ordering this additional test [id number] REPEATED TO VERIFY PLATELET COUNT CONFIRMED BY SMEAR    nRBC 0.0 0.0 - 0.2 %   Neutrophils Relative % 79 %   Neutro Abs 6.2 1.7 - 7.7 K/uL   Lymphocytes Relative 8 %   Lymphs Abs 0.6 (L) 0.7 - 4.0 K/uL   Monocytes Relative 12 %   Monocytes Absolute 0.9 0.1 - 1.0 K/uL   Eosinophils Relative 0 %   Eosinophils Absolute 0.0 0.0 - 0.5 K/uL   Basophils Relative 1 %   Basophils Absolute 0.1 0.0 - 0.1 K/uL   Immature Granulocytes 0 %   Abs Immature Granulocytes 0.03 0.00 - 0.07 K/uL    Comment: Performed at [facility], [address], [place], [place] [address]  Protime-INR     Status: Abnormal   Collection Time: 06/30/22 12:09 PM  Result Value Ref Range   Prothrombin Time 15.6 (H) 11.4 - 15.2 seconds   INR 1.2 0.8 -  1.2    Comment: (NOTE) INR goal varies based on device and disease states. Performed at [facility], [address], [place], [place] [address]   APTT     Status: None   Collection Time: 06/30/22 12:09 PM  Result Value Ref Range   aPTT 28 24 - 36 seconds    Comment: Performed at [facility], [address], [place], [place] [address]  Ethanol  Status: None   Collection Time: 06/30/22 12:09 PM  Result Value Ref Range   Alcohol, Ethyl (B) <10 <10 mg/dL    Comment: (NOTE) Lowest detectable limit for serum alcohol is 10 mg/dL.  For medical purposes only. Performed at [facility], [address], [place], [place] [address]   Urinalysis, w/ Reflex to Culture (Infection Suspected) -Urine, Clean Catch     Status: Abnormal   Collection Time: 06/30/22  2:25 PM  Result Value Ref Range   Specimen Source URINE, CLEAN CATCH    Color, Urine YELLOW YELLOW   APPearance CLEAR CLEAR   Specific Gravity, Urine 1.018 1.005 - 1.030   pH 6.0 5.0 - 8.0   Glucose, UA NEGATIVE NEGATIVE mg/dL   Hgb urine dipstick SMALL (A) NEGATIVE   Bilirubin Urine NEGATIVE NEGATIVE   Ketones, ur 20 (A) NEGATIVE mg/dL   Protein, ur NEGATIVE NEGATIVE mg/dL   Nitrite NEGATIVE NEGATIVE   Leukocytes,Ua SMALL (A) NEGATIVE   RBC / HPF 0-5 0 - 5 RBC/hpf   WBC, UA 0-5 0 - 5 WBC/hpf    Comment:        Reflex urine culture not performed if WBC <=10, OR if Squamous epithelial cells >5. If Squamous epithelial cells >5 suggest recollection.    Bacteria, UA NONE SEEN NONE SEEN   Squamous Epithelial / HPF 0-5 0 - 5 /HPF    Comment: Performed at [facility], [address], [place], [place] [address]  Rapid urine drug screen (hospital performed)     Status: Abnormal   Collection Time: 06/30/22  2:25 PM  Result Value Ref Range   Opiates POSITIVE (A) NONE DETECTED   Cocaine NONE DETECTED NONE DETECTED   Benzodiazepines NONE DETECTED NONE  DETECTED   Amphetamines NONE DETECTED NONE DETECTED   Tetrahydrocannabinol NONE DETECTED NONE DETECTED   Barbiturates NONE DETECTED NONE DETECTED    Comment: (NOTE) DRUG SCREEN FOR MEDICAL PURPOSES ONLY.  IF CONFIRMATION IS NEEDED FOR ANY PURPOSE, NOTIFY LAB WITHIN 5 DAYS.  LOWEST DETECTABLE LIMITS FOR URINE DRUG SCREEN Drug Class                     Cutoff (ng/mL) Amphetamine and metabolites    1000 Barbiturate and metabolites    200 Benzodiazepine                 200 Opiates and metabolites        300 Cocaine and metabolites        300 THC                            50 Performed at [facility], [address], [place], [place] [address]    CT ABDOMEN PELVIS W CONTRAST  Result Date: 06/30/2022 CLINICAL DATA:  Right lower quadrant abdominal pain. Generalized weakness. EXAM: CT ABDOMEN AND PELVIS WITH CONTRAST TECHNIQUE: Multidetector CT imaging of the abdomen and pelvis was performed using the standard protocol following bolus administration of intravenous contrast. RADIATION DOSE REDUCTION: This exam was performed according to the departmental dose-optimization program which includes automated exposure control, adjustment of the mA and/or kV according to patient size and/or use of iterative reconstruction technique. CONTRAST:  80mL OMNIPAQUE IOHEXOL 300 MG/ML  SOLN COMPARISON:  CT chest dated October 12, 2020 FINDINGS: Lower chest: Left lower lobe ground-glass opacities concerning for pneumonia. No appreciable pleural effusion or pneumothorax. Hepatobiliary: Diffuse low attenuation of hepatic  parenchyma concerning for severe hepatic steatosis. No gallstones, gallbladder wall thickening or biliary dilatation. Pancreas: Generalized pancreatic atrophy with calcification, likely sequela of chronic pancreatitis. No pancreatic ductal dilatation. Spleen: Normal in size without focal abnormality. Adrenals/Urinary Tract: Adrenal glands are unremarkable. Kidneys are normal,  without renal calculi, focal lesion, or hydronephrosis. Bladder is unremarkable. Stomach/Bowel: Stomach is within normal limits. Appendix appears normal. There is marked gaseous distention of the colonic loops without significant wall thickening. Vascular/Lymphatic: Aortic atherosclerosis. No enlarged abdominal or pelvic lymph nodes. Reproductive: Uterus and bilateral adnexa are unremarkable. Other: No abdominal wall hernia or abnormality. No abdominopelvic ascites. Musculoskeletal: Mild multilevel degenerate disc disease of the lumbar spine. No acute osseous abnormality. IMPRESSION: 1. Left lower lobe ground-glass opacities concerning for pneumonia. Follow-up examination to resolution is recommended. 2. Marked gaseous distention of the colonic loops without significant wall thickening. 3. Normal appendix. No evidence of colitis or diverticulitis. 4. Severe hepatic steatosis. 5. Generalized pancreatic atrophy with calcification, likely sequela of chronic pancreatitis. No pancreatic ductal dilatation. Aortic Atherosclerosis (ICD10-I70.0). Electronically Signed   By: Larose Hires D.O.   On: 06/30/2022 13:34    Pending Labs Unresulted Labs (From admission, onward)     Start     Ordered   07/01/22 0500  Comprehensive metabolic panel  Tomorrow morning,   R        06/30/22 1521   07/01/22 0500  CBC  Tomorrow morning,   R        06/30/22 1521   06/30/22 1538  Bilirubin, direct  Add-on,   AD        06/30/22 1537   06/30/22 1522  Hepatitis panel, acute  Once,   R        06/30/22 1521   06/30/22 1519  HIV Antibody (routine testing w rflx)  (HIV Antibody (Routine testing w reflex) panel)  Once,   R        06/30/22 1521   06/30/22 1154  Blood Culture (routine x 2)  (Undifferentiated presentation (screening labs and basic nursing orders))  BLOOD CULTURE X 2,   STAT      06/30/22 1159   06/30/22 1128  Lactic acid, plasma  Now then every 2 hours,   R (with STAT occurrences)      06/30/22 1128             Vitals/Pain Today's Vitals   06/30/22 1300 06/30/22 1334 06/30/22 1500 06/30/22 1526  BP: (!) 163/93  (!) 158/98   Pulse: (!) 104  92   Resp: (!) 24  18   Temp:    98.3 F (36.8 C)  TempSrc:    Oral  SpO2: 97%  98%   PainSc:  Asleep      Isolation Precautions No active isolations  Medications Medications  cefTRIAXone (ROCEPHIN) 2 g in sodium chloride 0.9 % 100 mL IVPB (2 g Intravenous New Bag/Given 06/30/22 1436)  azithromycin (ZITHROMAX) 500 mg in sodium chloride 0.9 % 250 mL IVPB (500 mg Intravenous New Bag/Given 06/30/22 1520)  amLODipine (NORVASC) tablet 10 mg (has no administration in time range)  losartan-hydrochlorothiazide (HYZAAR) 50-12.5 MG per tablet 1 tablet (has no administration in time range)  mirtazapine (REMERON) tablet 7.5 mg (has no administration in time range)  pantoprazole (PROTONIX) EC tablet 40 mg (has no administration in time range)  ferrous sulfate EC tablet 324 mg (has no administration in time range)  thiamine (VITAMIN B1) tablet 100 mg (has no administration in time range)  enoxaparin (LOVENOX) injection  40 mg (has no administration in time range)  ibuprofen (ADVIL) tablet 400 mg (has no administration in time range)  oxyCODONE (Oxy IR/ROXICODONE) immediate release tablet 5 mg (has no administration in time range)  docusate sodium (COLACE) capsule 100 mg (has no administration in time range)  polyethylene glycol (MIRALAX / GLYCOLAX) packet 17 g (has no administration in time range)  ondansetron (ZOFRAN) tablet 4 mg (has no administration in time range)    Or  ondansetron (ZOFRAN) injection 4 mg (has no administration in time range)  albuterol (PROVENTIL) (2.5 MG/3ML) 0.083% nebulizer solution 2.5 mg (has no administration in time range)  metoprolol tartrate (LOPRESSOR) injection 5 mg (has no administration in time range)  sodium chloride 0.9 % bolus 1,000 mL (1,000 mLs Intravenous New Bag/Given 06/30/22 1217)  ondansetron (ZOFRAN) injection 4 mg  (4 mg Intravenous Given 06/30/22 1217)  morphine (PF) 4 MG/ML injection 4 mg (4 mg Intravenous Given 06/30/22 1217)  iohexol (OMNIPAQUE) 300 MG/ML solution 80 mL (80 mLs Intravenous Contrast Given 06/30/22 1307)    Mobility walks     Focused Assessments    R Recommendations: See Admitting Provider Note  Report given to:   Additional Notes: n/a

## 2022-07-01 DIAGNOSIS — Z79899 Other long term (current) drug therapy: Secondary | ICD-10-CM | POA: Diagnosis not present

## 2022-07-01 DIAGNOSIS — F1010 Alcohol abuse, uncomplicated: Secondary | ICD-10-CM | POA: Diagnosis present

## 2022-07-01 DIAGNOSIS — E871 Hypo-osmolality and hyponatremia: Secondary | ICD-10-CM | POA: Diagnosis present

## 2022-07-01 DIAGNOSIS — I10 Essential (primary) hypertension: Secondary | ICD-10-CM | POA: Diagnosis present

## 2022-07-01 DIAGNOSIS — J45909 Unspecified asthma, uncomplicated: Secondary | ICD-10-CM | POA: Diagnosis present

## 2022-07-01 DIAGNOSIS — J189 Pneumonia, unspecified organism: Secondary | ICD-10-CM | POA: Diagnosis not present

## 2022-07-01 DIAGNOSIS — K7010 Alcoholic hepatitis without ascites: Secondary | ICD-10-CM

## 2022-07-01 DIAGNOSIS — R7989 Other specified abnormal findings of blood chemistry: Secondary | ICD-10-CM

## 2022-07-01 DIAGNOSIS — E876 Hypokalemia: Secondary | ICD-10-CM

## 2022-07-01 DIAGNOSIS — Z880 Allergy status to penicillin: Secondary | ICD-10-CM | POA: Diagnosis not present

## 2022-07-01 DIAGNOSIS — Z91199 Patient's noncompliance with other medical treatment and regimen due to unspecified reason: Secondary | ICD-10-CM | POA: Diagnosis not present

## 2022-07-01 DIAGNOSIS — K760 Fatty (change of) liver, not elsewhere classified: Secondary | ICD-10-CM | POA: Diagnosis present

## 2022-07-01 DIAGNOSIS — K7030 Alcoholic cirrhosis of liver without ascites: Secondary | ICD-10-CM | POA: Diagnosis not present

## 2022-07-01 DIAGNOSIS — E441 Mild protein-calorie malnutrition: Secondary | ICD-10-CM | POA: Diagnosis present

## 2022-07-01 DIAGNOSIS — D6959 Other secondary thrombocytopenia: Secondary | ICD-10-CM | POA: Diagnosis present

## 2022-07-01 DIAGNOSIS — Z681 Body mass index (BMI) 19 or less, adult: Secondary | ICD-10-CM | POA: Diagnosis not present

## 2022-07-01 DIAGNOSIS — E8729 Other acidosis: Secondary | ICD-10-CM | POA: Diagnosis present

## 2022-07-01 DIAGNOSIS — D649 Anemia, unspecified: Secondary | ICD-10-CM | POA: Diagnosis present

## 2022-07-01 LAB — CBC
HCT: 28.2 % — ABNORMAL LOW (ref 36.0–46.0)
Hemoglobin: 10.1 g/dL — ABNORMAL LOW (ref 12.0–15.0)
MCH: 34.4 pg — ABNORMAL HIGH (ref 26.0–34.0)
MCHC: 35.8 g/dL (ref 30.0–36.0)
MCV: 95.9 fL (ref 80.0–100.0)
Platelets: 64 10*3/uL — ABNORMAL LOW (ref 150–400)
RBC: 2.94 MIL/uL — ABNORMAL LOW (ref 3.87–5.11)
RDW: 13.7 % (ref 11.5–15.5)
WBC: 4.9 10*3/uL (ref 4.0–10.5)
nRBC: 0 % (ref 0.0–0.2)

## 2022-07-01 LAB — COMPREHENSIVE METABOLIC PANEL
ALT: 25 U/L (ref 0–44)
AST: 82 U/L — ABNORMAL HIGH (ref 15–41)
Albumin: 2.5 g/dL — ABNORMAL LOW (ref 3.5–5.0)
Alkaline Phosphatase: 176 U/L — ABNORMAL HIGH (ref 38–126)
Anion gap: 12 (ref 5–15)
BUN: <5 mg/dL — ABNORMAL LOW (ref 8–23)
CO2: 27 mmol/L (ref 22–32)
Calcium: 6.3 mg/dL — CL (ref 8.9–10.3)
Chloride: 97 mmol/L — ABNORMAL LOW (ref 98–111)
Creatinine, Ser: 0.62 mg/dL (ref 0.44–1.00)
GFR, Estimated: >60 mL/min (ref >60)
Glucose, Bld: 97 mg/dL (ref 70–99)
Potassium: 2.7 mmol/L — CL (ref 3.5–5.1)
Sodium: 136 mmol/L (ref 135–145)
Total Bilirubin: 2.6 mg/dL — ABNORMAL HIGH (ref 0.3–1.2)
Total Protein: 6.7 g/dL (ref 6.5–8.1)

## 2022-07-01 MED ORDER — POTASSIUM CHLORIDE CRYS ER 20 MEQ PO TBCR
40.0000 meq | EXTENDED_RELEASE_TABLET | Freq: Two times a day (BID) | ORAL | Status: AC
  Administered 2022-07-01 (×2): 40 meq via ORAL
  Filled 2022-07-01 (×2): qty 2

## 2022-07-01 MED ORDER — CALCIUM GLUCONATE-NACL 1-0.675 GM/50ML-% IV SOLN
1.0000 g | Freq: Once | INTRAVENOUS | Status: AC
  Administered 2022-07-01: 1000 mg via INTRAVENOUS
  Filled 2022-07-01: qty 50

## 2022-07-01 MED ORDER — METRONIDAZOLE 500 MG PO TABS
500.0000 mg | ORAL_TABLET | Freq: Two times a day (BID) | ORAL | Status: DC
  Administered 2022-07-01 – 2022-07-02 (×3): 500 mg via ORAL
  Filled 2022-07-01 (×3): qty 1

## 2022-07-01 NOTE — Progress Notes (Signed)
  Transition of Care [facility]) Screening Note   Patient Details  Name: Brandy Bishop Date of Birth: 24-Jun-1960   Transition of Care [facility]) CM/SW Contact:    Otelia Santee, LCSW Phone Number: 07/01/2022, 10:32 AM    Transition of Care Department [facility]) has reviewed patient and no TOC needs have been identified at this time. We will continue to monitor patient advancement through interdisciplinary progression rounds. If new patient transition needs arise, please place a TOC consult.

## 2022-07-01 NOTE — Plan of Care (Signed)
?  Problem: Activity: ?Goal: Risk for activity intolerance will decrease ?Outcome: Progressing ?  ?Problem: Safety: ?Goal: Ability to remain free from injury will improve ?Outcome: Progressing ?  ?Problem: Pain Managment: ?Goal: General experience of comfort will improve ?Outcome: Progressing ?  ?

## 2022-07-01 NOTE — Progress Notes (Signed)
[facility] PROGRESS NOTE  Brandy Bishop (DOB: Jan 25, 1961) ZOX:[id number] PCP: Cline Crock, NP   Brief Narrative: Brandy Bishop is a 62 y.o. female with a history of alcohol abuse, polysubstance use, HTN, hepatic steatosis who presented to the ED on 06/30/2022 with right-sided abdominal discomfort with nausea and vomiting followed by a cough and exertional shortness of breath. She was tachycardic with no fever or leukocytosis. LFTs were elevated, EtOH negative, UDS +only opioids, UA negative microscopy. CT abd/pelvis revealed marked gaseous distention of colonic loops without evidence of colitis, diverticulitis, appendicitis. There was severe hepatic steatosis without obstructive findings, chronic calcific pancreatitis, as well as LLL opacities consistent with pneumonia. Antibiotics were administered, GI consulted and the patient was admitted.   Subjective: Short of breath on exertion, some cough is stable, feels better than yesterday. Still RUQ abdominal discomfort, vaguely. No new complaints  Objective: BP 112/68 (BP Location: Left Arm)   Pulse (!) 107   Temp 99.4 F (37.4 C) (Oral)   Resp 18   Ht 5\' 3"  (1.6 m)   Wt 39.8 kg   SpO2 97%   BMI 15.54 kg/m   Gen: Chronically ill-appearing thin female in no distress Pulm: Crackles in left base. No wheezes, nonlabored at rest, but tachypneic.  CV: Regular tachycardia, no MRG or edema GI: Soft, NT, ND, +BS  Neuro: Alert and oriented. No new focal deficits. Ext: Warm, no deformities. Skin: No rashes, lesions or ulcers on visualized skin   Assessment & Plan: LLL pneumonia: In setting of heavy EtOH use, nausea/vomiting, will cover for aspiration pneumonia.  - Ceftriaxone/flagyl, plan 5 days. Note PCN allergy listed.  - Repeat imaging after resolution is recommended per radiology.  - Check ambulatory pulse oximetry.  - Blood cultures NGTD.   Alcohol abuse:  - Monitor for withdrawals, none currently - Thiamine -  Cessation counseling  Alcoholic hepatitis on hepatic steatosis: LFT elevations improving with supportive measures, removal of toxic EtOH. Discriminant function too low to indicate steroids, and values improving currently. Acute hepatitis panel negative. HIV NR.  - EtOH cessation counseling - TBili down 4.1 > 2.6, AST down 122 > 82.   Hypokalemia:  - Supplement BID today  Hypocalcemia:  - Supplement.   Thrombocytopenia: Chronic, due to EtOH and hepatic steatosis. Spleen normal on CT - Ok to continue lowered dose lovenox for VTE ppx.  Hyponatremia: Due to liver disease. Resolved.   HTN:  - Continue norvasc, losartan, holding HCTZ with hypokalemia  Alcoholic ketoacidosis: Resolved.  Suspected protein calorie malnutrition: Body mass index is 15.54 kg/m.  - Supplement protein.  Tyrone Nine, MD [facility] [email or web address] 07/01/2022, 2:09 PM

## 2022-07-01 NOTE — Consult Note (Signed)
Consultation  Referring Provider:     [facility] Primary Care Physician:  Cline Crock, NP Primary Gastroenterologist:        Dr.Pyrtle Reason for Consultation:     Abnormal LFT            HPI:   Brandy Bishop is a 61 y.o. female with history of heavy alcohol use, polysubstance abuse admitted with pneumonia and abnormal LFT.  Patient has history of daily alcohol use, reports drinking several beers.  She has stopped drinking hard liquor.  She usually starts drinking 8:00 in the morning. Denies any nausea, vomiting, abdominal pain, melena or bright red blood per rectum  She was having cough for the last 3 for 4 days.  Denies any fever or chills.  In the ER patient was tachycardic with normal O2 sat.  Bilirubin was elevated to 4.1, AST 122 and ALT 31  Past Medical History:  Diagnosis Date   Alcoholism (HCC) 2011   Asthma    Chronic lower back pain    Colitis 04/2014.   Bloody diarrhea   Daily headache    GIB (gastrointestinal bleeding)    Hypertension    Pancreatitis    Chronic pancreatitis noted on CT scan in 04/2014.   Polysubstance abuse (HCC) 2011   Pyelonephritis 05/2012   Seizures (HCC)    last seizure was 04/2019 per patient     Past Surgical History:  Procedure Laterality Date   COLONOSCOPY WITH PROPOFOL N/A 09/09/2019   Procedure: COLONOSCOPY WITH PROPOFOL;  Surgeon: Charlott Rakes, MD;  Location: [facility] ENDOSCOPY;  Service: Endoscopy;  Laterality: N/A;   ESOPHAGOGASTRODUODENOSCOPY N/A 05/31/2015   Procedure: ESOPHAGOGASTRODUODENOSCOPY (EGD);  Surgeon: Hilarie Fredrickson, MD;  Location: [facility] ENDOSCOPY;  Service: Endoscopy;  Laterality: N/A;   ESOPHAGOGASTRODUODENOSCOPY N/A 09/09/2019   Procedure: ESOPHAGOGASTRODUODENOSCOPY (EGD);  Surgeon: Charlott Rakes, MD;  Location: [facility] ENDOSCOPY;  Service: Endoscopy;  Laterality: N/A;   ORIF HUMERUS FRACTURE Right 05/12/2019   Procedure: OPEN REDUCTION INTERNAL FIXATION (ORIF) DISTAL HUMERUS FRACTURE;  Surgeon: Bjorn Pippin, MD;  Location: [facility] ORS;  Service: Orthopedics;  Laterality: Right;    Family History  Problem Relation Age of Onset   Cancer Mother    Cancer Brother      Social History   Tobacco Use   Smoking status: Never   Smokeless tobacco: Never  Vaping Use   Vaping Use: Never used  Substance Use Topics   Alcohol use: Not Currently    Alcohol/week: 10.0 standard drinks of alcohol    Types: 10 Cans of beer per week    Comment: just occ a drink   Drug use: No    Prior to Admission medications   Medication Sig Start Date End Date Taking? Authorizing Provider  amLODipine (NORVASC) 10 MG tablet Take 10 mg by mouth daily. 08/13/19  Yes [provider]  cetirizine (ZYRTEC) 10 MG tablet Take 10 mg by mouth daily. 12/28/19  Yes [provider]  ferrous sulfate 324 MG TBEC Take 324 mg by mouth daily with breakfast.   Yes [provider]  losartan-hydrochlorothiazide (HYZAAR) 50-12.5 MG tablet Take 1 tablet by mouth daily.   Yes [provider]  mirtazapine (REMERON) 7.5 MG tablet Take 7.5 mg by mouth at bedtime. 04/04/22 10/01/22 Yes [provider]  omeprazole (PRILOSEC) 20 MG capsule Take 1 capsule (20 mg total) by mouth daily. 10/12/20 06/30/22 Yes Linwood Dibbles, MD  ondansetron (ZOFRAN-ODT) 8 MG disintegrating tablet Take 8 mg by mouth 2 (  two) times daily as needed for vomiting or nausea. 03/19/22  Yes [provider]  folic acid (FOLVITE) 1 MG tablet Take 1 tablet (1 mg total) by mouth daily. Patient not taking: Reported on 06/30/2022 09/10/19   Lynn Ito, MD  thiamine 100 MG tablet Take 1 tablet (100 mg total) by mouth daily. 09/10/19   Lynn Ito, MD    Current Facility-Administered Medications  Medication Dose Route Frequency Provider Last Rate Last Admin   0.9 %  sodium chloride infusion   Intravenous Continuous Kirby Crigler, Mir M, MD 75 mL/hr at 07/01/22 0602 New Bag at 07/01/22 0602   albuterol (PROVENTIL) (2.5 MG/3ML) 0.083% nebulizer  solution 2.5 mg  2.5 mg Nebulization Q2H PRN Kirby Crigler, Mir M, MD       amLODipine (NORVASC) tablet 10 mg  10 mg Oral Daily Kirby Crigler, Mir M, MD   10 mg at 07/01/22 0809   azithromycin (ZITHROMAX) 500 mg in sodium chloride 0.9 % 250 mL IVPB  500 mg Intravenous Q24H Kirby Crigler, Mir M, MD 250 mL/hr at 06/30/22 1520 500 mg at 06/30/22 1520   calcium gluconate 1 g/ 50 mL sodium chloride IVPB  1 g Intravenous Once Tyrone Nine, MD 50 mL/hr at 07/01/22 0904 1,000 mg at 07/01/22 0904   cefTRIAXone (ROCEPHIN) 2 g in sodium chloride 0.9 % 100 mL IVPB  2 g Intravenous Q24H Kirby Crigler, Mir M, MD 200 mL/hr at 06/30/22 1436 2 g at 06/30/22 1436   docusate sodium (COLACE) capsule 100 mg  100 mg Oral BID Kirby Crigler, Mir M, MD   100 mg at 07/01/22 0809   enoxaparin (LOVENOX) injection 30 mg  30 mg Subcutaneous Q24H Len Childs T, RPH       ferrous sulfate tablet 324 mg  324 mg Oral Q breakfast Kirby Crigler, Mir M, MD   324 mg at 07/01/22 4098   ibuprofen (ADVIL) tablet 400 mg  400 mg Oral Q6H PRN Kirby Crigler, Mir M, MD   400 mg at 06/30/22 1805   losartan (COZAAR) tablet 50 mg  50 mg Oral Daily Kirby Crigler, Mir M, MD   50 mg at 07/01/22 0809   metoprolol tartrate (LOPRESSOR) injection 5 mg  5 mg Intravenous Q6H PRN Kirby Crigler, Mir M, MD       mirtazapine (REMERON) tablet 7.5 mg  7.5 mg Oral QHS Kirby Crigler, Mir M, MD   7.5 mg at 06/30/22 2101   ondansetron (ZOFRAN) tablet 4 mg  4 mg Oral Q6H PRN Kirby Crigler, Mir M, MD       Or   ondansetron [facility]) injection 4 mg  4 mg Intravenous Q6H PRN Kirby Crigler, Mir M, MD       oxyCODONE (Oxy IR/ROXICODONE) immediate release tablet 5 mg  5 mg Oral Q4H PRN Kirby Crigler, Mir M, MD       pantoprazole (PROTONIX) EC tablet 40 mg  40 mg Oral Daily Kirby Crigler, Mir M, MD   40 mg at 07/01/22 0814   polyethylene glycol (MIRALAX / GLYCOLAX) packet 17 g  17 g Oral Daily PRN Kirby Crigler, Mir M, MD       potassium chloride SA (KLOR-CON M) CR tablet 40 mEq  40 mEq Oral BID Luiz Iron,  NP   40 mEq at 07/01/22 1191   thiamine (VITAMIN B1) tablet 100 mg  100 mg Oral Daily Kirby Crigler, Mir M, MD   100 mg at 07/01/22 0805    Allergies as of 06/30/2022 - Review Complete 06/30/2022  Allergen Reaction Noted   Penicillins Swelling 03/15/2011  Review of Systems:    This is positive for those things mentioned in the HPI. All other review of systems are negative.       Physical Exam:  Vital signs in last 24 hours: Temp:  [98.2 F (36.8 C)-99.2 F (37.3 C)] 99.2 F (37.3 C) (05/27 0622) Pulse Rate:  [92-130] 95 (05/27 0622) Resp:  [16-24] 18 (05/27 0622) BP: (119-172)/(72-110) 119/72 (05/27 0622) SpO2:  [95 %-100 %] 95 % (05/27 0622) Weight:  [39.8 kg] 39.8 kg (05/26 1641) Last BM Date :  (PTA)  General:  Cachectic appearing  eyes:  anicteric. Lungs: Bilateral crackles and rhonchi in the right lower base.   Heart:              S1S2, no rubs, murmurs, gallops. Abdomen:  soft, non-tender, mild abdominal distention  Extremities:   no edema Skin   no rash. Neuro:  A&O x 3.  Psych:  appropriate mood and  Affect.   Data Reviewed:   LAB RESULTS: Recent Labs    06/30/22 1209 07/01/22 0455  WBC 7.8 4.9  HGB 11.6* 10.1*  HCT 33.4* 28.2*  PLT 75* 64*   BMET Recent Labs    06/30/22 1209 07/01/22 0455  NA 133* 136  K 3.3* 2.7*  CL 90* 97*  CO2 20* 27  GLUCOSE 135* 97  BUN 6* <5*  CREATININE 0.83 0.62  CALCIUM 7.3* 6.3*   LFT Recent Labs    06/30/22 1703 07/01/22 0455  PROT  --  6.7  ALBUMIN  --  2.5*  AST  --  82*  ALT  --  25  ALKPHOS  --  176*  BILITOT  --  2.6*  BILIDIR 1.4*  --    PT/INR Recent Labs    06/30/22 1209  LABPROT 15.6*  INR 1.2    STUDIES: CT ABDOMEN PELVIS W CONTRAST  Result Date: 06/30/2022 CLINICAL DATA:  Right lower quadrant abdominal pain. Generalized weakness. EXAM: CT ABDOMEN AND PELVIS WITH CONTRAST TECHNIQUE: Multidetector CT imaging of the abdomen and pelvis was performed using the standard protocol  following bolus administration of intravenous contrast. RADIATION DOSE REDUCTION: This exam was performed according to the departmental dose-optimization program which includes automated exposure control, adjustment of the mA and/or kV according to patient size and/or use of iterative reconstruction technique. CONTRAST:  80mL OMNIPAQUE IOHEXOL 300 MG/ML  SOLN COMPARISON:  CT chest dated October 12, 2020 FINDINGS: Lower chest: Left lower lobe ground-glass opacities concerning for pneumonia. No appreciable pleural effusion or pneumothorax. Hepatobiliary: Diffuse low attenuation of hepatic parenchyma concerning for severe hepatic steatosis. No gallstones, gallbladder wall thickening or biliary dilatation. Pancreas: Generalized pancreatic atrophy with calcification, likely sequela of chronic pancreatitis. No pancreatic ductal dilatation. Spleen: Normal in size without focal abnormality. Adrenals/Urinary Tract: Adrenal glands are unremarkable. Kidneys are normal, without renal calculi, focal lesion, or hydronephrosis. Bladder is unremarkable. Stomach/Bowel: Stomach is within normal limits. Appendix appears normal. There is marked gaseous distention of the colonic loops without significant wall thickening. Vascular/Lymphatic: Aortic atherosclerosis. No enlarged abdominal or pelvic lymph nodes. Reproductive: Uterus and bilateral adnexa are unremarkable. Other: No abdominal wall hernia or abnormality. No abdominopelvic ascites. Musculoskeletal: Mild multilevel degenerate disc disease of the lumbar spine. No acute osseous abnormality. IMPRESSION: 1. Left lower lobe ground-glass opacities concerning for pneumonia. Follow-up examination to resolution is recommended. 2. Marked gaseous distention of the colonic loops without significant wall thickening. 3. Normal appendix. No evidence of colitis or diverticulitis. 4. Severe hepatic steatosis. 5. Generalized pancreatic  atrophy with calcification, likely sequela of chronic  pancreatitis. No pancreatic ductal dilatation. Aortic Atherosclerosis (ICD10-I70.0). Electronically Signed   By: Larose Hires D.O.   On: 06/30/2022 13:34     PREVIOUS ENDOSCOPIES:                Impression / Plan:   62 year old female with polysubstance and alcohol abuse with abnormal LFT likely secondary to alcoholic hepatitis and cirrhosis  MELD 3.0: 16 at 07/01/2022  4:55 AM MELD-Na: 13 at 07/01/2022  4:55 AM  Discriminant function score less than 32, no indication for steroids and also contraindicated in the setting of active infection with pneumonia  Does not have significant ascites and no liver lesions concerning for hepatocellular carcinoma No asterixis or clinical findings to suggest hepatic encephalopathy  No active GI bleeding, patient will benefit from EGD for variceal screening, can be done as outpatient.  Prior colonoscopy in 2021 by Dr. Bosie Clos was poor prep, inadequate for colorectal cancer screening. Noncompliant, did not follow-up in GI office or undergo EGD and colonoscopy as recommended in 2022  Community-acquired pneumonia/possible aspiration: Currently on broad-spectrum antibiotics, improving  Monitor for alcohol withdrawal Discussed alcohol cessation  Severe hyponatremia secondary to cirrhosis and poor nutrition/p.o. intake  Severe hypokalemia: Replete per primary team  GI will continue to follow along.     Iona Beard , MD [phone]

## 2022-07-02 ENCOUNTER — Other Ambulatory Visit: Payer: Self-pay | Admitting: Gastroenterology

## 2022-07-02 DIAGNOSIS — J189 Pneumonia, unspecified organism: Secondary | ICD-10-CM | POA: Diagnosis not present

## 2022-07-02 DIAGNOSIS — R7989 Other specified abnormal findings of blood chemistry: Secondary | ICD-10-CM

## 2022-07-02 LAB — CBC
HCT: 28.2 % — ABNORMAL LOW (ref 36.0–46.0)
Hemoglobin: 9.9 g/dL — ABNORMAL LOW (ref 12.0–15.0)
MCH: 33.1 pg (ref 26.0–34.0)
MCHC: 35.1 g/dL (ref 30.0–36.0)
MCV: 94.3 fL (ref 80.0–100.0)
Platelets: 64 10*3/uL — ABNORMAL LOW (ref 150–400)
RBC: 2.99 MIL/uL — ABNORMAL LOW (ref 3.87–5.11)
RDW: 13.6 % (ref 11.5–15.5)
WBC: 6 10*3/uL (ref 4.0–10.5)
nRBC: 0 % (ref 0.0–0.2)

## 2022-07-02 LAB — COMPREHENSIVE METABOLIC PANEL
ALT: 23 U/L (ref 0–44)
AST: 79 U/L — ABNORMAL HIGH (ref 15–41)
Albumin: 2.4 g/dL — ABNORMAL LOW (ref 3.5–5.0)
Alkaline Phosphatase: 145 U/L — ABNORMAL HIGH (ref 38–126)
Anion gap: 11 (ref 5–15)
BUN: <5 mg/dL — ABNORMAL LOW (ref 8–23)
CO2: 24 mmol/L (ref 22–32)
Calcium: 6.9 mg/dL — ABNORMAL LOW (ref 8.9–10.3)
Chloride: 98 mmol/L (ref 98–111)
Creatinine, Ser: 0.55 mg/dL (ref 0.44–1.00)
GFR, Estimated: >60 mL/min (ref >60)
Glucose, Bld: 119 mg/dL — ABNORMAL HIGH (ref 70–99)
Potassium: 3 mmol/L — ABNORMAL LOW (ref 3.5–5.1)
Sodium: 133 mmol/L — ABNORMAL LOW (ref 135–145)
Total Bilirubin: 2.3 mg/dL — ABNORMAL HIGH (ref 0.3–1.2)
Total Protein: 6.3 g/dL — ABNORMAL LOW (ref 6.5–8.1)

## 2022-07-02 LAB — CULTURE, BLOOD (ROUTINE X 2): Culture: NO GROWTH

## 2022-07-02 MED ORDER — CEFDINIR 300 MG PO CAPS: 300.0000 mg | ORAL_CAPSULE | Freq: Two times a day (BID) | ORAL | 0 refills | Status: AC

## 2022-07-02 MED ORDER — METRONIDAZOLE 500 MG PO TABS: 500.0000 mg | ORAL_TABLET | Freq: Two times a day (BID) | ORAL | 0 refills | Status: AC

## 2022-07-02 NOTE — Progress Notes (Signed)
Progress Note  Primary GI: Dr. Rhea Belton  LOS: 1 day   Chief Complaint: Abnormal LFTs   Subjective   Patient states "she is feeling great today" and reports she is ready to go home. Denies abdominal pain, nausea, and vomiting. Had a small bowel movement this morning. Denies melena/hematochezia   Objective   Vital signs in last 24 hours: Temp:  [99.4 F (37.4 C)-100.1 F (37.8 C)] 99.6 F (37.6 C) (05/28 0537) Pulse Rate:  [61-107] 61 (05/28 0537) Resp:  [18-20] 18 (05/28 0537) BP: (112-140)/(68-82) 140/82 (05/28 0537) SpO2:  [96 %-97 %] 97 % (05/28 0537) Last BM Date :  (PTA) Last BM recorded by nurses in past 5 days No data recorded  General:   female in no acute distress  Heart:  Regular rate and rhythm; no murmurs Pulm: Clear anteriorly; no wheezing Abdomen: soft, nondistended, normal bowel sounds in all quadrants. Nontender without guarding. No organomegaly appreciated. Extremities:  No edema Neurologic:  Alert and  oriented x4;  No focal deficits.  Psych:  Cooperative. Normal mood and affect.  Intake/Output from previous day: 05/27 0701 - 05/28 0700 In: 100 [IV Piggyback:100] Out: -  Intake/Output this shift: Total I/O In: 240 [P.O.:240] Out: -   Studies/Results: CT ABDOMEN PELVIS W CONTRAST  Result Date: 06/30/2022 CLINICAL DATA:  Right lower quadrant abdominal pain. Generalized weakness. EXAM: CT ABDOMEN AND PELVIS WITH CONTRAST TECHNIQUE: Multidetector CT imaging of the abdomen and pelvis was performed using the standard protocol following bolus administration of intravenous contrast. RADIATION DOSE REDUCTION: This exam was performed according to the departmental dose-optimization program which includes automated exposure control, adjustment of the mA and/or kV according to patient size and/or use of iterative reconstruction technique. CONTRAST:  80mL OMNIPAQUE IOHEXOL 300 MG/ML  SOLN COMPARISON:  CT chest dated October 12, 2020 FINDINGS: Lower chest: Left  lower lobe ground-glass opacities concerning for pneumonia. No appreciable pleural effusion or pneumothorax. Hepatobiliary: Diffuse low attenuation of hepatic parenchyma concerning for severe hepatic steatosis. No gallstones, gallbladder wall thickening or biliary dilatation. Pancreas: Generalized pancreatic atrophy with calcification, likely sequela of chronic pancreatitis. No pancreatic ductal dilatation. Spleen: Normal in size without focal abnormality. Adrenals/Urinary Tract: Adrenal glands are unremarkable. Kidneys are normal, without renal calculi, focal lesion, or hydronephrosis. Bladder is unremarkable. Stomach/Bowel: Stomach is within normal limits. Appendix appears normal. There is marked gaseous distention of the colonic loops without significant wall thickening. Vascular/Lymphatic: Aortic atherosclerosis. No enlarged abdominal or pelvic lymph nodes. Reproductive: Uterus and bilateral adnexa are unremarkable. Other: No abdominal wall hernia or abnormality. No abdominopelvic ascites. Musculoskeletal: Mild multilevel degenerate disc disease of the lumbar spine. No acute osseous abnormality. IMPRESSION: 1. Left lower lobe ground-glass opacities concerning for pneumonia. Follow-up examination to resolution is recommended. 2. Marked gaseous distention of the colonic loops without significant wall thickening. 3. Normal appendix. No evidence of colitis or diverticulitis. 4. Severe hepatic steatosis. 5. Generalized pancreatic atrophy with calcification, likely sequela of chronic pancreatitis. No pancreatic ductal dilatation. Aortic Atherosclerosis (ICD10-I70.0). Electronically Signed   By: Larose Hires D.O.   On: 06/30/2022 13:34    Lab Results: Recent Labs    06/30/22 1209 07/01/22 0455 07/02/22 0535  WBC 7.8 4.9 6.0  HGB 11.6* 10.1* 9.9*  HCT 33.4* 28.2* 28.2*  PLT 75* 64* 64*   BMET Recent Labs    06/30/22 1209 07/01/22 0455 07/02/22 0535  NA 133* 136 133*  K 3.3* 2.7* 3.0*  CL 90* 97* 98   CO2 20* 27 24  GLUCOSE 135* 97 119*  BUN 6* <5* <5*  CREATININE 0.83 0.62 0.55  CALCIUM 7.3* 6.3* 6.9*   LFT Recent Labs    06/30/22 1703 07/01/22 0455 07/02/22 0535  PROT  --    < > 6.3*  ALBUMIN  --    < > 2.4*  AST  --    < > 79*  ALT  --    < > 23  ALKPHOS  --    < > 145*  BILITOT  --    < > 2.3*  BILIDIR 1.4*  --   --    < > = values in this interval not displayed.   PT/INR Recent Labs    06/30/22 1209  LABPROT 15.6*  INR 1.2     Scheduled Meds:  amLODipine  10 mg Oral Daily   docusate sodium  100 mg Oral BID   enoxaparin (LOVENOX) injection  30 mg Subcutaneous Q24H   ferrous sulfate  324 mg Oral Q breakfast   losartan  50 mg Oral Daily   metroNIDAZOLE  500 mg Oral Q12H   mirtazapine  7.5 mg Oral QHS   pantoprazole  40 mg Oral Daily   thiamine  100 mg Oral Daily   Continuous Infusions:  cefTRIAXone (ROCEPHIN)  IV Stopped (07/01/22 1443)      Patient profile:   Brandy Bishop is a 62 y.o. female with history of heavy alcohol use, polysubstance abuse admitted with pneumonia and abnormal LFT likely secondary to alcoholic hepatitis and severe hepatic steatosis    Impression:   Elevated LFTs -AST 79/ALT 23/alk phos 145, improving -T. bili 2.3 -CT abd/pelvis revealed marked gaseous distention of colonic loops without evidence of colitis, diverticulitis, appendicitis. There was severe hepatic steatosis without obstructive findings, chronic calcific pancreatitis, as well as LLL opacities consistent with pneumonia  DF 19.2, therefore it is less than 32, no indication for steroids. LFTs are improving.  LLL pneumonia -discharged on oral abx   Plan:   - counseled patient on cessation of alcohol use - outpatient lab follow up for resolution of elevated LFTs - scheduled for outpatient follow up OV appt for hepatic steatosis and need for outpatient repeat screening colonoscopy.  Yeray Tomas Leanna Sato  07/02/2022, 9:08 AM

## 2022-07-02 NOTE — Progress Notes (Signed)
Mobility Specialist - Progress Note   07/02/22 0859  Mobility  Activity Ambulated with assistance in hallway  Level of Assistance Standby assist, set-up cues, supervision of patient - no hands on  Assistive Device None  Distance Ambulated (ft) 200 ft  Range of Motion/Exercises Active  Activity Response Tolerated well  Mobility Referral Yes  $Mobility charge 1 Mobility  Mobility Specialist Start Time (ACUTE ONLY) 0850  Mobility Specialist Stop Time (ACUTE ONLY) 0859  Mobility Specialist Time Calculation (min) (ACUTE ONLY) 9 min   Pt received in bed and agreed to mobility.   Nurse requested Mobility Specialist to perform oxygen saturation test with pt which includes removing pt from oxygen both at rest and while ambulating.  Below are the results from that testing.     Patient Saturations on Room Air at Rest = spO2 94%  Patient Saturations on Room Air while Ambulating = sp02 92% .    At end of testing pt left in room on Room Air of oxygen.  Reported results to nurse.   Pt returned to bed with all needs met.  Marilynne Halsted Mobility Specialist

## 2022-07-02 NOTE — Discharge Summary (Signed)
Physician Discharge Summary   Patient: Brandy Bishop MRN: [id number] DOB: 1961-01-22  Admit date:     06/30/2022  Discharge date: 07/02/22  Discharge Physician: Tyrone Nine   PCP: Cline Crock, NP   Recommendations at discharge:  Follow up with PCP in 1-2 weeks.  Complete course of antibiotics for aspiration pneumonia. Suggest repeat imaging after convalescence.  Continue alcohol cessation counseling.  Discharge Diagnoses: Principal Problem:   CAP (community acquired pneumonia)  Hospital Course: Brandy Bishop is a 62 y.o. female with a history of alcohol abuse, polysubstance use, HTN, hepatic steatosis who presented to the ED on 06/30/2022 with right-sided abdominal discomfort with nausea and vomiting followed by a cough and exertional shortness of breath. She was tachycardic with no fever or leukocytosis. LFTs were elevated, EtOH negative, UDS +only opioids, UA negative microscopy. CT abd/pelvis revealed marked gaseous distention of colonic loops without evidence of colitis, diverticulitis, appendicitis. There was severe hepatic steatosis without obstructive findings, chronic calcific pancreatitis, as well as LLL opacities consistent with pneumonia. Antibiotics were administered, GI consulted and the patient was admitted. Clinically improved and stabilized for discharge. Please see details below.   Assessment and Plan: LLL pneumonia: In setting of heavy EtOH use, nausea/vomiting, will cover for aspiration pneumonia.  - Ceftriaxone/flagyl given with improvement and tolerance, will complete course with omnicef/flagyl. Note PCN allergy listed.  - Repeat imaging after resolution is recommended per radiology.  - Check ambulatory pulse oximetry this morning of discharge > no dyspnea, ambulating well, no hypoxia.  - Blood cultures NGTD at discharge.    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 Discriminant function too low to indicate steroids, and values improving currently. Acute hepatitis panel negative. HIV NR.  - EtOH cessation counseling - TBili down 4.1 > 2.6, AST down 122 > 82.    Hypokalemia:  - Supplement BID today   Hypocalcemia:  - Supplement.    Thrombocytopenia: Chronic, due to EtOH and hepatic steatosis. Spleen normal on CT - Ok to continue lowered dose lovenox for VTE ppx.   Hyponatremia: Due to liver disease. Resolved.    HTN:  - Continue norvasc, losartan, holding HCTZ with hypokalemia   Alcoholic ketoacidosis: Resolved.   Suspected protein calorie malnutrition: Body mass index is 15.54 kg/m.  - Supplement protein.  Consultants: None Procedures performed: None  Disposition: Home Diet recommendation: Regular DISCHARGE MEDICATION: Allergies as of 07/02/2022       Reactions   Penicillins Swelling   .Has patient had a PCN reaction causing immediate rash, facial/tongue/throat swelling, SOB or lightheadedness with hypotension: Yes Has patient had a PCN reaction causing severe rash involving mucus membranes or skin necrosis: No Has patient had a PCN reaction that required hospitalization No Has patient had a PCN reaction occurring within the last 10 years: No If all of the above answers are "NO", then may proceed with Cephalosporin use.        Medication List     TAKE these medications    amLODipine 10 MG tablet Commonly known as: NORVASC Take 10 mg by mouth daily.   cefdinir 300 MG capsule Commonly known as: OMNICEF Take 1 capsule (300 mg total) by mouth 2 (two) times daily for 5 days.   cetirizine 10 MG tablet Commonly known as: ZYRTEC Take 10 mg by mouth daily.   ferrous sulfate 324 MG  Tbec Take 324 mg by mouth daily with breakfast.   folic acid 1 MG tablet Commonly known as: FOLVITE Take 1 tablet (1 mg total) by mouth  daily.   losartan-hydrochlorothiazide 50-12.5 MG tablet Commonly known as: HYZAAR Take 1 tablet by mouth daily.   metroNIDAZOLE 500 MG tablet Commonly known as: Flagyl Take 1 tablet (500 mg total) by mouth 2 (two) times daily for 5 days.   mirtazapine 7.5 MG tablet Commonly known as: REMERON Take 7.5 mg by mouth at bedtime.   omeprazole 20 MG capsule Commonly known as: PRILOSEC Take 1 capsule (20 mg total) by mouth daily.   ondansetron 8 MG disintegrating tablet Commonly known as: ZOFRAN-ODT Take 8 mg by mouth 2 (two) times daily as needed for vomiting or nausea.   thiamine 100 MG tablet Commonly known as: VITAMIN B1 Take 1 tablet (100 mg total) by mouth daily.        Follow-up Information     Cline Crock, NP Follow up.   Specialty: Family Medicine Contact information: [address] [place] [address] [phone]                Discharge Exam: Ceasar Mons Weights   06/30/22 1641  Weight: 39.8 kg  BP (!) 140/82 (BP Location: Left Arm)   Pulse 61   Temp 99.6 F (37.6 C) (Oral)   Resp 18   Ht 5\' 3"  (1.6 m)   Wt 39.8 kg   SpO2 97%   BMI 15.54 kg/m   Chronically ill-appearing, very thin female in no distress. Seen in bed, later walking the halls with steady gait and no complaints. She denied any abdominal pain, nausea, or vomiting, reported tolerating a diet. Cough has nearly resolved and she has no shortness of breath or chest discomfort. Wants to go home.   Crackles in L base without wheezes. Nonlabored on room air. RRR, no MRG or edema  Condition at discharge: good  The results of significant diagnostics from this hospitalization (including imaging, microbiology, ancillary and laboratory) are listed below for reference.   Imaging Studies: CT ABDOMEN PELVIS W CONTRAST  Result Date: 06/30/2022 CLINICAL DATA:  Right lower quadrant abdominal pain. Generalized weakness. EXAM: CT ABDOMEN AND PELVIS WITH CONTRAST TECHNIQUE: Multidetector CT  imaging of the abdomen and pelvis was performed using the standard protocol following bolus administration of intravenous contrast. RADIATION DOSE REDUCTION: This exam was performed according to the departmental dose-optimization program which includes automated exposure control, adjustment of the mA and/or kV according to patient size and/or use of iterative reconstruction technique. CONTRAST:  80mL OMNIPAQUE IOHEXOL 300 MG/ML  SOLN COMPARISON:  CT chest dated October 12, 2020 FINDINGS: Lower chest: Left lower lobe ground-glass opacities concerning for pneumonia. No appreciable pleural effusion or pneumothorax. Hepatobiliary: Diffuse low attenuation of hepatic parenchyma concerning for severe hepatic steatosis. No gallstones, gallbladder wall thickening or biliary dilatation. Pancreas: Generalized pancreatic atrophy with calcification, likely sequela of chronic pancreatitis. No pancreatic ductal dilatation. Spleen: Normal in size without focal abnormality. Adrenals/Urinary Tract: Adrenal glands are unremarkable. Kidneys are normal, without renal calculi, focal lesion, or hydronephrosis. Bladder is unremarkable. Stomach/Bowel: Stomach is within normal limits. Appendix appears normal. There is marked gaseous distention of the colonic loops without significant wall thickening. Vascular/Lymphatic: Aortic atherosclerosis. No enlarged abdominal or pelvic lymph nodes. Reproductive: Uterus and bilateral adnexa are unremarkable. Other: No abdominal wall hernia or abnormality. No abdominopelvic ascites. Musculoskeletal: Mild multilevel degenerate disc disease of the lumbar spine. No acute osseous abnormality. IMPRESSION: 1. Left  lower lobe ground-glass opacities concerning for pneumonia. Follow-up examination to resolution is recommended. 2. Marked gaseous distention of the colonic loops without significant wall thickening. 3. Normal appendix. No evidence of colitis or diverticulitis. 4. Severe hepatic steatosis. 5.  Generalized pancreatic atrophy with calcification, likely sequela of chronic pancreatitis. No pancreatic ductal dilatation. Aortic Atherosclerosis (ICD10-I70.0). Electronically Signed   By: Larose Hires D.O.   On: 06/30/2022 13:34    Microbiology: Results for orders placed or performed during the hospital encounter of 06/30/22  Blood Culture (routine x 2)     Status: None (Preliminary result)   Collection Time: 06/30/22 12:09 PM   Specimen: BLOOD RIGHT ARM  Result Value Ref Range Status   Specimen Description   Final    BLOOD RIGHT ARM Performed at [facility], [address] [place], [place] [address]    Special Requests   Final    BOTTLES DRAWN AEROBIC AND ANAEROBIC Blood Culture adequate volume Performed at [facility], [address] [place], [place] [address]    Culture   Final    NO GROWTH 2 DAYS Performed at [facility], [address] [place], [place] [address]    Report Status PENDING  Incomplete  Blood Culture (routine x 2)     Status: None (Preliminary result)   Collection Time: 06/30/22  5:03 PM   Specimen: BLOOD LEFT HAND  Result Value Ref Range Status   Specimen Description   Final    BLOOD LEFT HAND Performed at [facility], [address], [place], [place] [address]    Special Requests   Final    AEROBIC BOTTLE ONLY Blood Culture adequate volume Performed at [facility], [address] [place], [place] [address]    Culture   Final    NO GROWTH 2 DAYS Performed at [facility], [address] [place], [place] [address]    Report Status PENDING  Incomplete    Labs: CBC: Recent Labs  Lab 06/30/22 1209 07/01/22 0455 07/02/22 0535  WBC 7.8 4.9 6.0  NEUTROABS 6.2  --   --   HGB 11.6* 10.1* 9.9*  HCT 33.4* 28.2* 28.2*  MCV 96.3 95.9 94.3  PLT 75* 64* 64*   Basic Metabolic Panel: Recent Labs  Lab 06/30/22 1209 07/01/22 0455 07/02/22 0535  NA 133* 136  133*  K 3.3* 2.7* 3.0*  CL 90* 97* 98  CO2 20* 27 24  GLUCOSE 135* 97 119*  BUN 6* <5* <5*  CREATININE 0.83 0.62 0.55  CALCIUM 7.3* 6.3* 6.9*   Liver Function Tests: Recent Labs  Lab 06/30/22 1209 07/01/22 0455 07/02/22 0535  AST 122* 82* 79*  ALT 31 25 23   ALKPHOS 223* 176* 145*  BILITOT 4.1* 2.6* 2.3*  PROT 8.5* 6.7 6.3*  ALBUMIN 3.3* 2.5* 2.4*   CBG: No results for input(s): "GLUCAP" in the last 168 hours.  Discharge time spent: greater than 30 minutes.  Signed: Tyrone Nine, MD [facility] 07/02/2022

## 2022-07-03 LAB — CULTURE, BLOOD (ROUTINE X 2): Special Requests: ADEQUATE

## 2022-07-04 LAB — CULTURE, BLOOD (ROUTINE X 2): Culture: NO GROWTH

## 2022-07-05 LAB — CULTURE, BLOOD (ROUTINE X 2): Special Requests: ADEQUATE

## 2022-09-12 ENCOUNTER — Ambulatory Visit: Payer: MEDICAID | Admitting: Nurse Practitioner

## 2022-09-12 IMAGING — CT CT CERVICAL SPINE W/O CM
3 of 4 series · 13 of 33 positions shown, 16 images · non-contrast
Comparison: CT head and C-spine 01/15/2020

CLINICAL DATA: Head trauma, moderate-severe; Neck trauma,
intoxicated or obtunded (Age >= 16y). after mechanical fall at home,
per report pt fell from steps. Ccollar in place on arrival. Pt
presents as poor historian, +ETOH, ?thinners.



[Series 4: c_spine 2.0 st · axial · 0.33mm/px · z∈[-278,-152]mm · 5 of 95 slices shown, 7 images]
[im 16/95  soft-tissue]
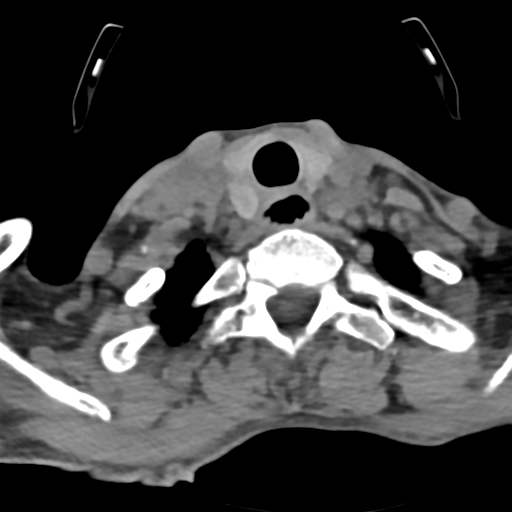
[im 16/95  bone]
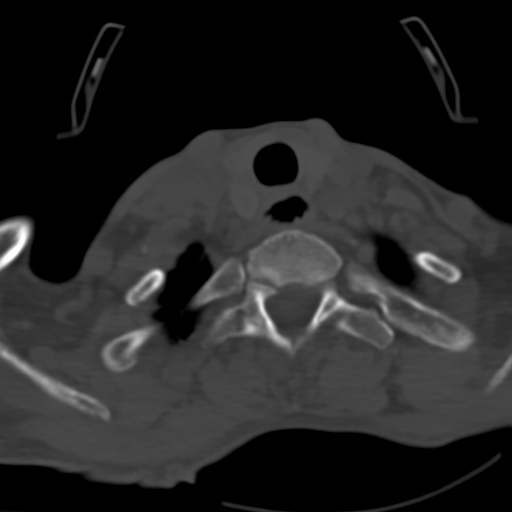
[im 32/95  bone]
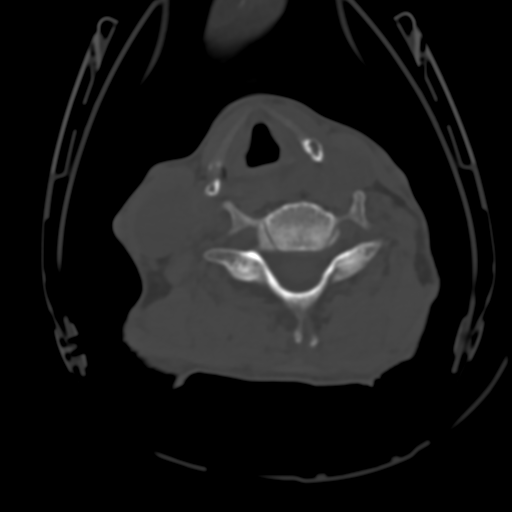
[im 48/95  bone]
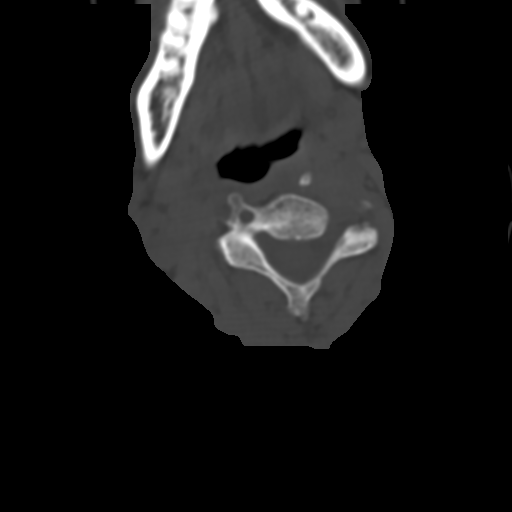
[im 63/95  bone]
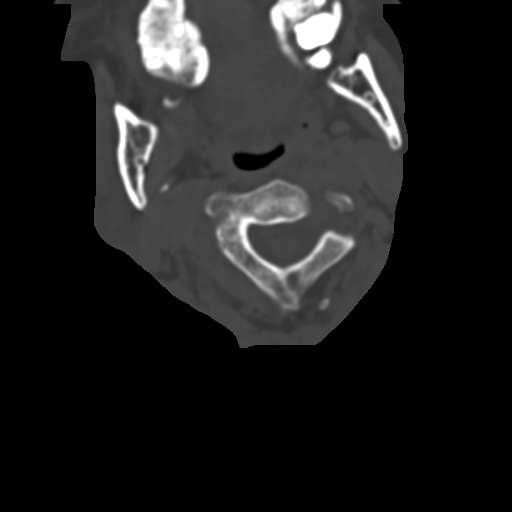
[im 79/95  soft-tissue]
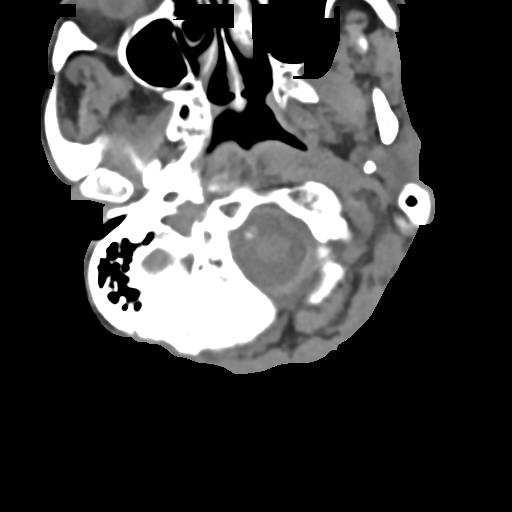
[im 79/95  bone]
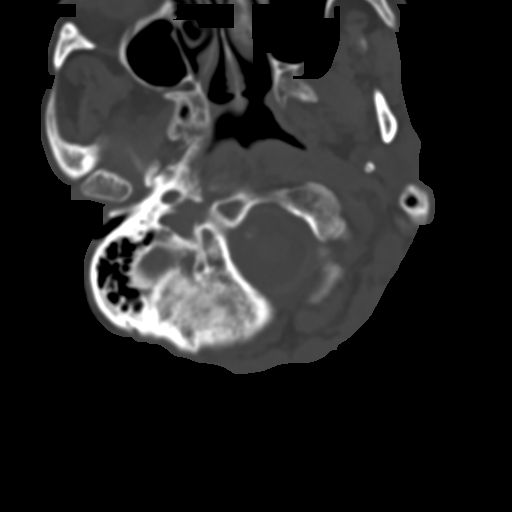

[Series 5: coronal bone · coronal · 0.28mm/px · 3 of 68 slices shown]
[im 14/68  bone]
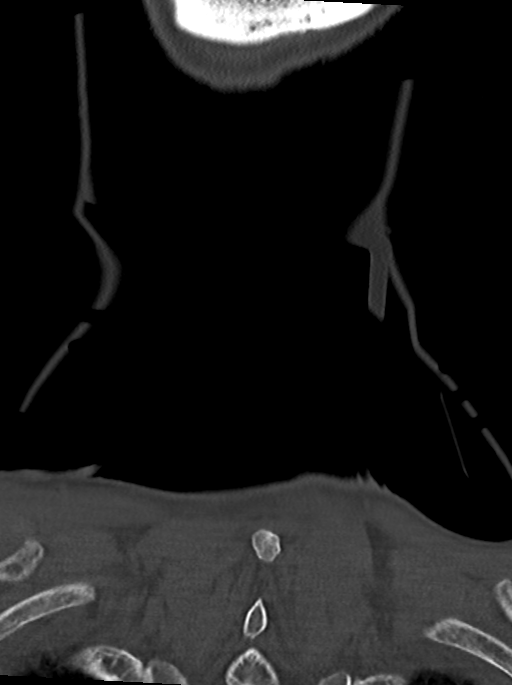
[im 27/68  bone]
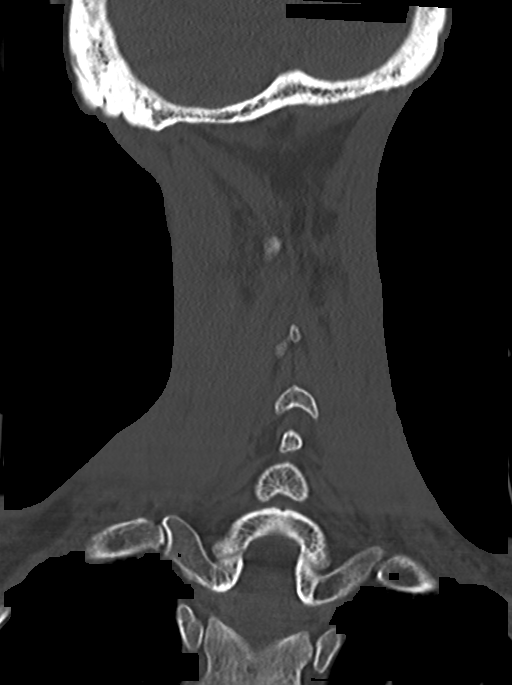
[im 41/68  bone]
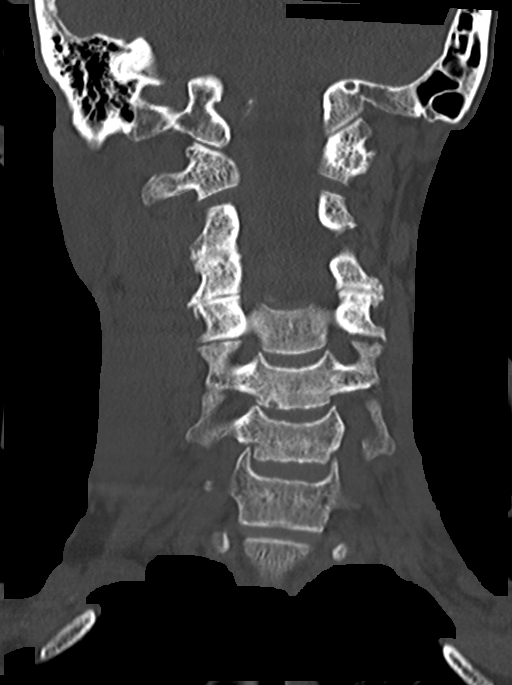

[Series 7: sagittal bone · sagittal · 0.28mm/px · 5 of 61 slices shown, 6 images]
[im 21/61  bone]
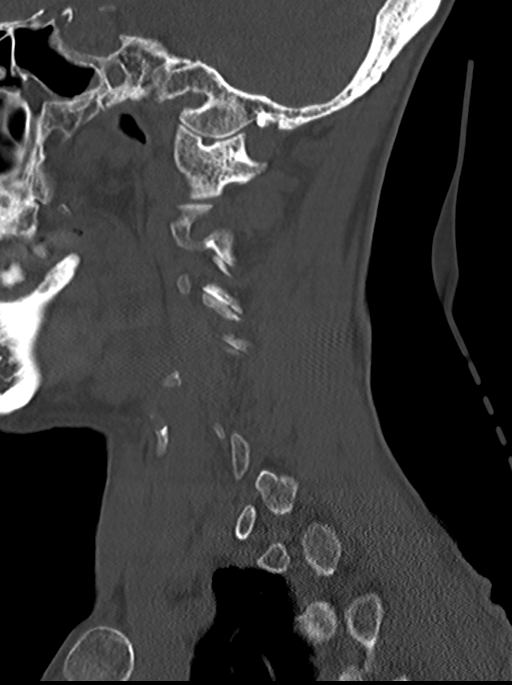
[im 26/61  bone]
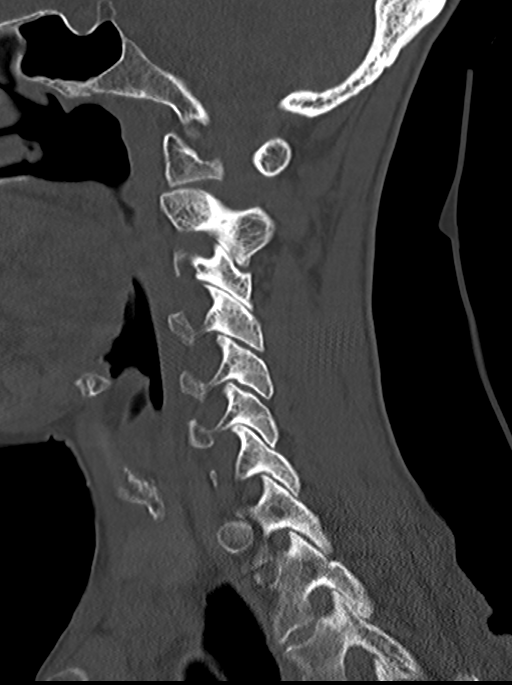
[im 31/61  soft-tissue]
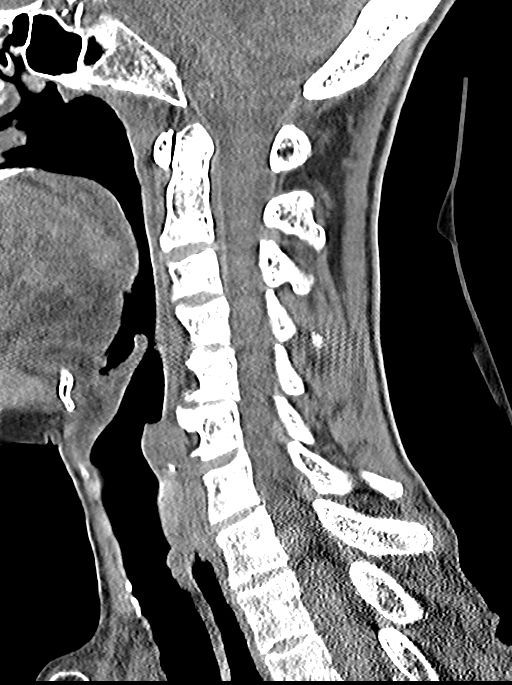
[im 31/61  bone]
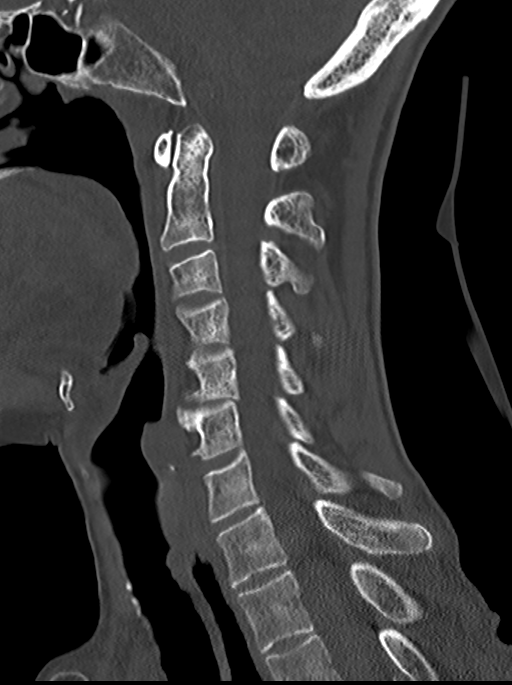
[im 36/61  bone]
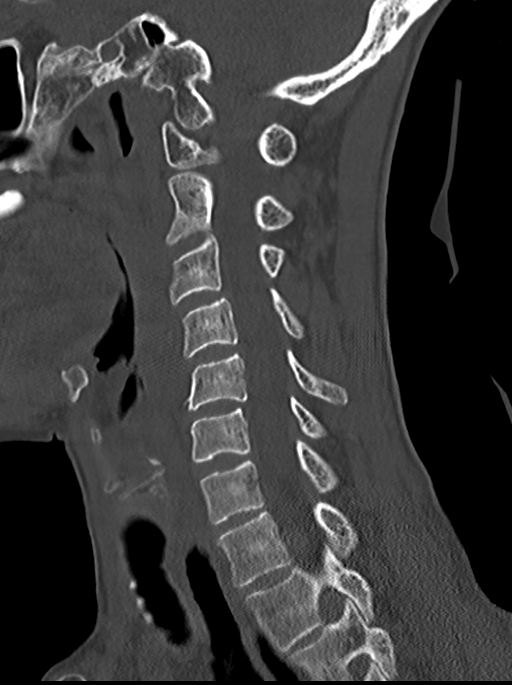
[im 41/61  bone]
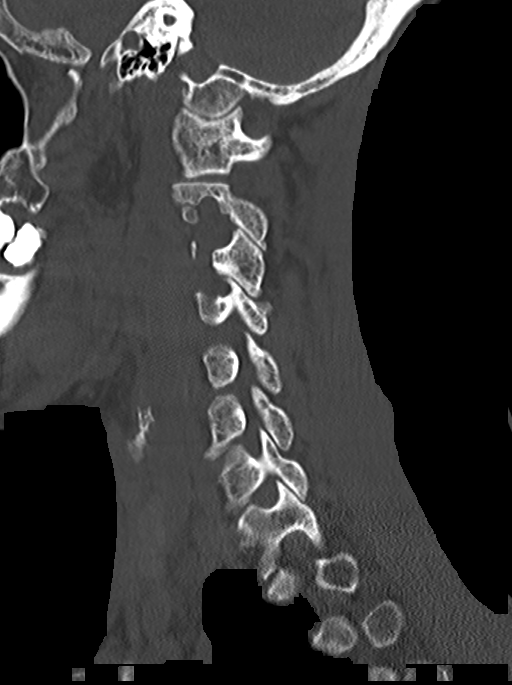

[13 of 33 positions shown; findings below may reference images not displayed]

FINDINGS: CT HEAD FINDINGS

BRAIN:
BRAIN
Cerebral ventricle sizes are concordant with the degree of cerebral
volume loss. Patchy and confluent areas of decreased attenuation are
noted throughout the deep and periventricular white matter of the
cerebral hemispheres bilaterally, compatible with chronic
microvascular ischemic disease. Chronic left parieto-occipital
encephalomalacia.

No evidence of large-territorial acute infarction. No parenchymal
hemorrhage. No mass lesion. No extra-axial collection.

No mass effect or midline shift. No hydrocephalus. Basilar cisterns
are patent.

Vascular: No hyperdense vessel. Atherosclerotic calcifications are
present within the cavernous internal carotid and vertebral
arteries.

Skull: No acute fracture or focal lesion.

Sinuses/Orbits: Paranasal sinuses and mastoid air cells are clear.
The orbits are unremarkable.

Other: None.

CT CERVICAL SPINE FINDINGS

Alignment: Normal.

Skull base and vertebrae: Multilevel mild degenerative changes of
the spine. No acute fracture. No aggressive appearing focal osseous
lesion or focal pathologic process.

Soft tissues and spinal canal: No prevertebral fluid or swelling. No
visible canal hematoma.

Upper chest: Unremarkable.

Other: None.
IMPRESSION: 1. No acute intracranial abnormality.
2. No acute displaced fracture or traumatic listhesis of the
cervical spine.

## 2022-09-12 IMAGING — CT CT HEAD W/O CM
3 of 5 series · 14 of 47 positions shown, 16 images · non-contrast
Comparison: CT head and C-spine 01/15/2020

CLINICAL DATA: Head trauma, moderate-severe; Neck trauma,
intoxicated or obtunded (Age >= 16y). after mechanical fall at home,
per report pt fell from steps. Ccollar in place on arrival. Pt
presents as poor historian, +ETOH, ?thinners.



[Series 4: head 2.0 h70h · axial · 0.41mm/px · z∈[-156,-24]mm · 8 of 76 slices shown, 10 images]
[im 5/76  brain]
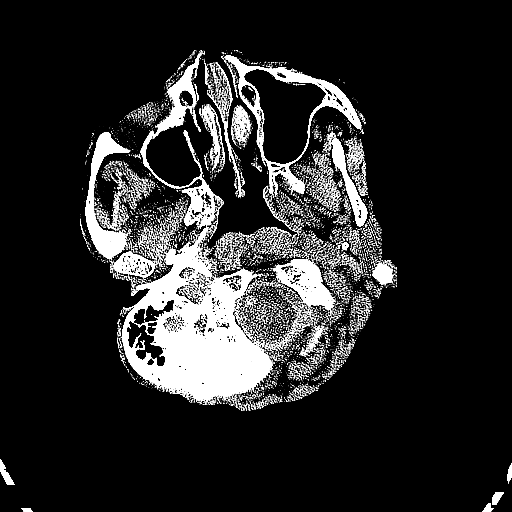
[im 5/76  bone]
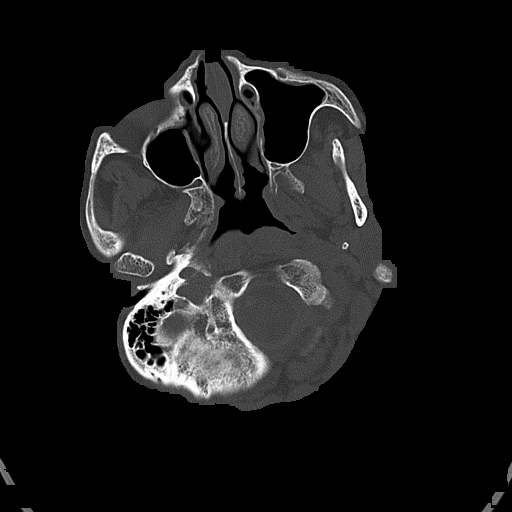
[im 15/76  brain]
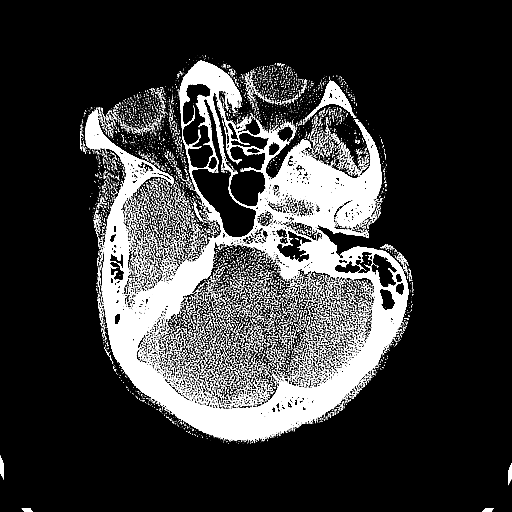
[im 24/76  brain]
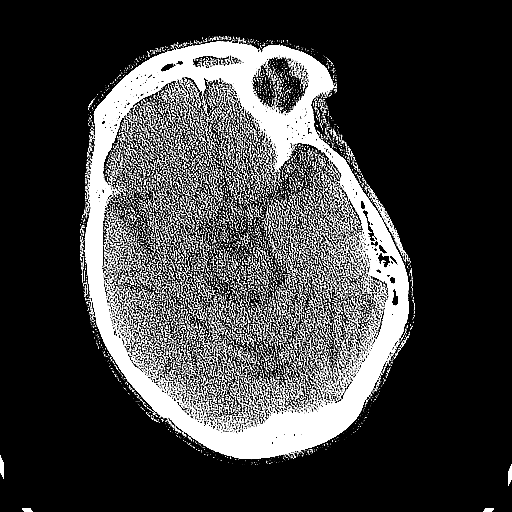
[im 33/76  brain]
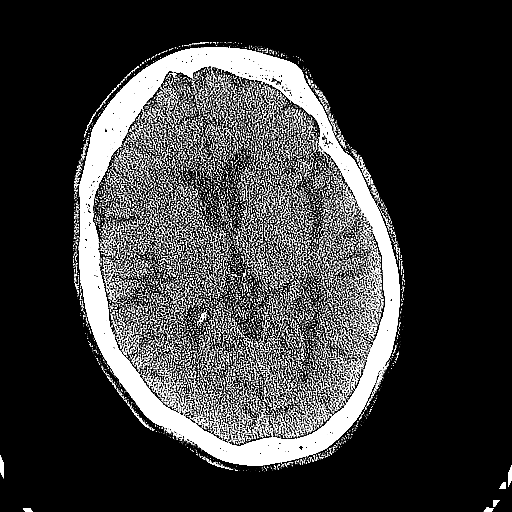
[im 43/76  brain]
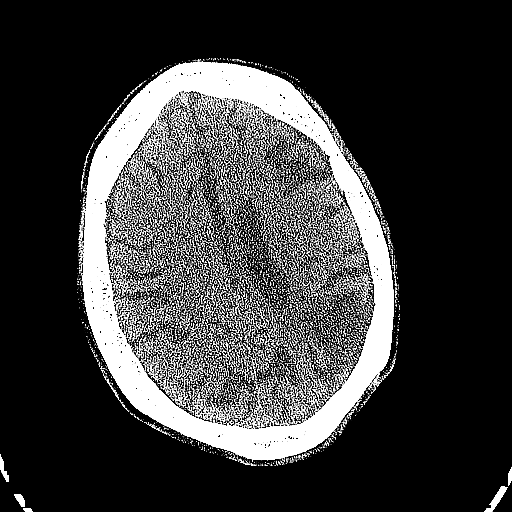
[im 43/76  bone]
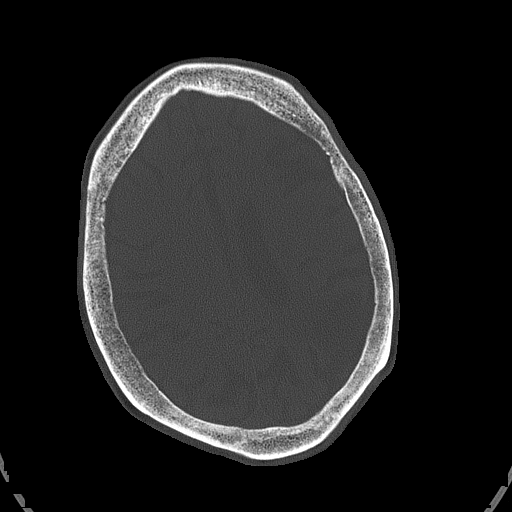
[im 52/76  brain]
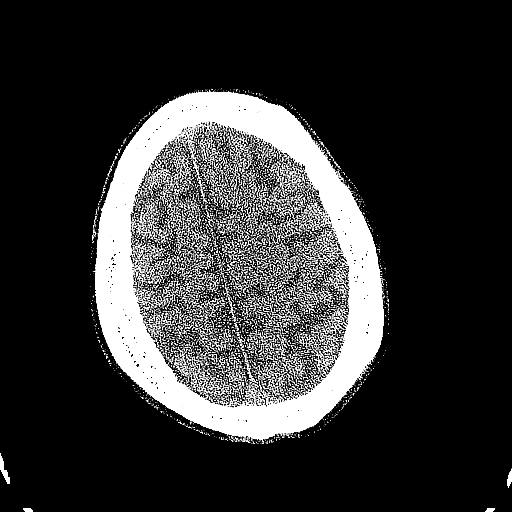
[im 61/76  brain]
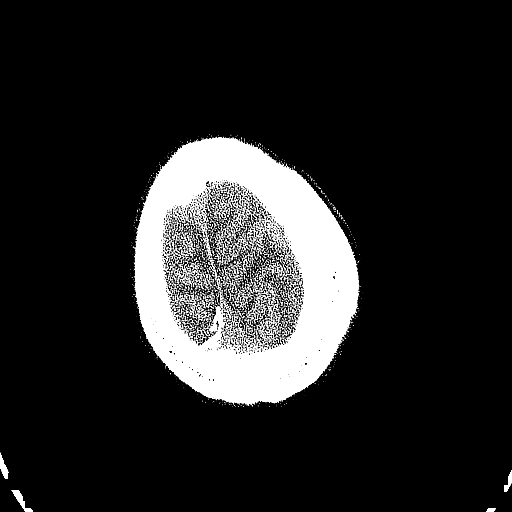
[im 71/76  brain]
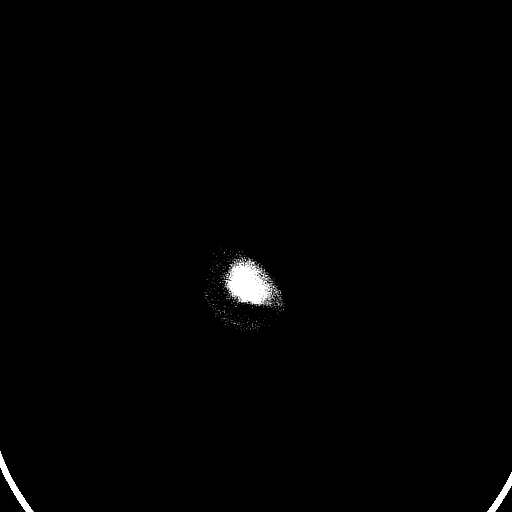

[Series 5: head 3.0 mpr cor · coronal · 0.30mm/px · 3 of 67 slices shown]
[im 23/67  brain]
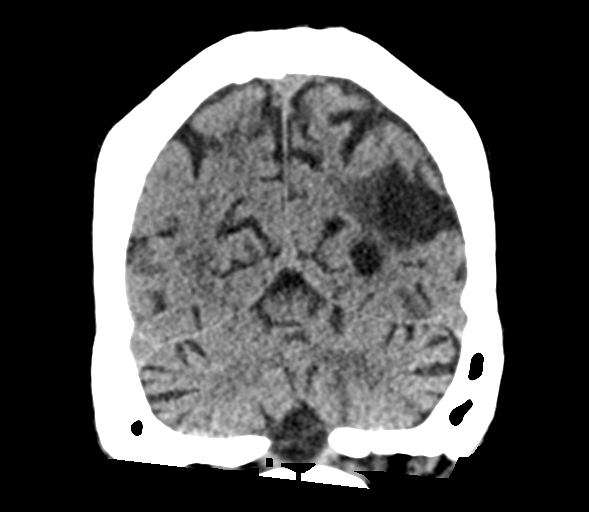
[im 30/67  brain]
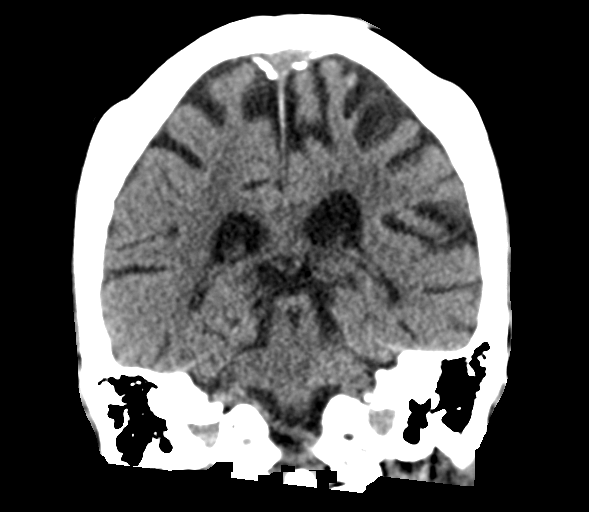
[im 37/67  brain]
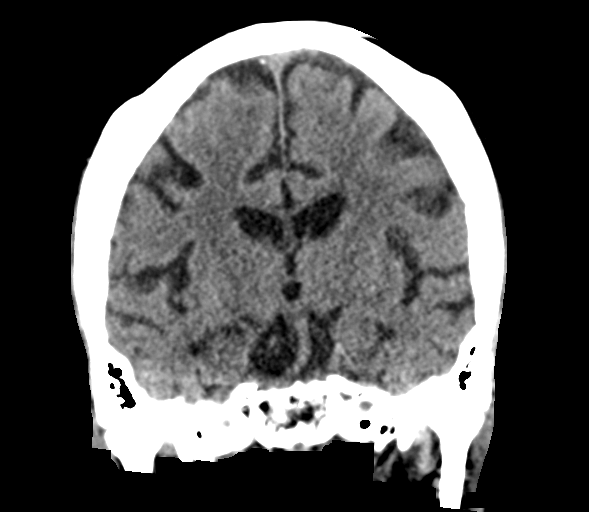

[Series 6: head 3.0 mpr sag · sagittal · 0.33mm/px · 3 of 54 slices shown]
[im 20/54  brain]
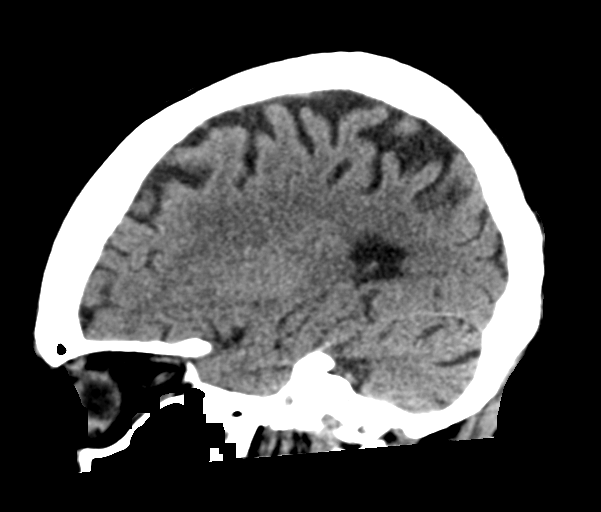
[im 27/54  brain]
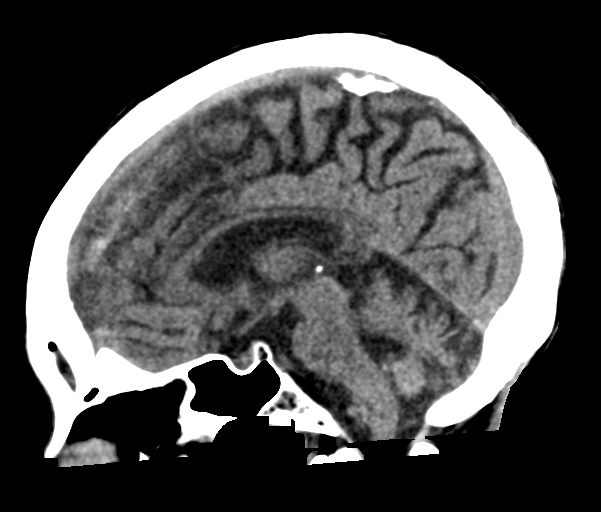
[im 33/54  brain]
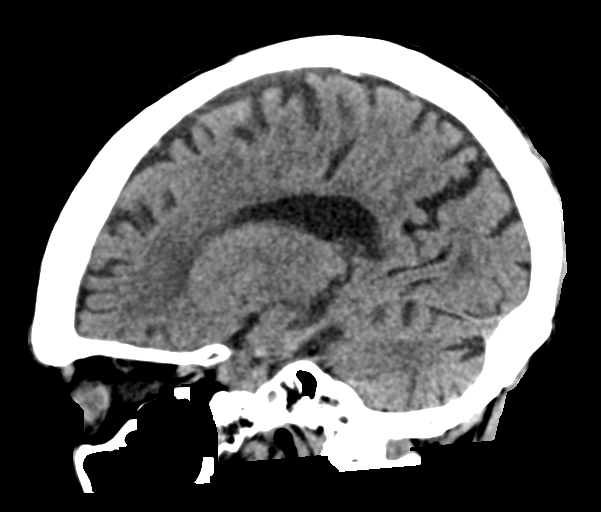

[14 of 47 positions shown; findings below may reference images not displayed]

FINDINGS: CT HEAD FINDINGS

BRAIN:
BRAIN
Cerebral ventricle sizes are concordant with the degree of cerebral
volume loss. Patchy and confluent areas of decreased attenuation are
noted throughout the deep and periventricular white matter of the
cerebral hemispheres bilaterally, compatible with chronic
microvascular ischemic disease. Chronic left parieto-occipital
encephalomalacia.

No evidence of large-territorial acute infarction. No parenchymal
hemorrhage. No mass lesion. No extra-axial collection.

No mass effect or midline shift. No hydrocephalus. Basilar cisterns
are patent.

Vascular: No hyperdense vessel. Atherosclerotic calcifications are
present within the cavernous internal carotid and vertebral
arteries.

Skull: No acute fracture or focal lesion.

Sinuses/Orbits: Paranasal sinuses and mastoid air cells are clear.
The orbits are unremarkable.

Other: None.

CT CERVICAL SPINE FINDINGS

Alignment: Normal.

Skull base and vertebrae: Multilevel mild degenerative changes of
the spine. No acute fracture. No aggressive appearing focal osseous
lesion or focal pathologic process.

Soft tissues and spinal canal: No prevertebral fluid or swelling. No
visible canal hematoma.

Upper chest: Unremarkable.

Other: None.
IMPRESSION: 1. No acute intracranial abnormality.
2. No acute displaced fracture or traumatic listhesis of the
cervical spine.

## 2022-09-12 NOTE — Progress Notes (Deleted)
09/12/2022 Brandy Bishop [id number] [birth year]   Chief Complaint:    History of Present Illness: Sol Blazing. Brandy Bishop is a 62 year old female with a past medical history of asthma, hypertension, pyelonephritis, seizures, alcohol use disorder, polysubstance abuse, chronic pancreatitis who was admitted to the hospital 06/30/2022 with pneumonia. She was evaluated by our inpatient GI service due to having elevated LFTs.     Latest Ref Rng & Units 07/02/2022    5:35 AM 07/01/2022    4:55 AM 06/30/2022   12:09 PM  CBC  WBC 4.0 - 10.5 K/uL 6.0  4.9  7.8   Hemoglobin 12.0 - 15.0 g/dL 9.9  40.9  81.1   Hematocrit 36.0 - 46.0 % 28.2  28.2  33.4   Platelets 150 - 400 K/uL 64  64  75        Latest Ref Rng & Units 07/02/2022    5:35 AM 07/01/2022    4:55 AM 06/30/2022   12:09 PM  CMP  Glucose 70 - 99 mg/dL 914  97  782   BUN 8 - 23 mg/dL <5  <5  6   Creatinine 0.44 - 1.00 mg/dL 9.56  2.13  0.86   Sodium 135 - 145 mmol/L 133  136  133   Potassium 3.5 - 5.1 mmol/L 3.0  2.7  3.3   Chloride 98 - 111 mmol/L 98  97  90   CO2 22 - 32 mmol/L 24  27  20    Calcium 8.9 - 10.3 mg/dL 6.9  6.3  7.3   Total Protein 6.5 - 8.1 g/dL 6.3  6.7  8.5   Total Bilirubin 0.3 - 1.2 mg/dL 2.3  2.6  4.1   Alkaline Phos 38 - 126 U/L 145  176  223   AST 15 - 41 U/L 79  82  122   ALT 0 - 44 U/L 23  25  31      CTAP 06/30/2022: FINDINGS: Lower chest: Left lower lobe ground-glass opacities concerning for pneumonia. No appreciable pleural effusion or pneumothorax.   Hepatobiliary: Diffuse low attenuation of hepatic parenchyma concerning for severe hepatic steatosis. No gallstones, gallbladder wall thickening or biliary dilatation.   Pancreas: Generalized pancreatic atrophy with calcification, likely sequela of chronic pancreatitis. No pancreatic ductal dilatation.   Spleen: Normal in size without focal abnormality.   Adrenals/Urinary Tract: Adrenal glands are unremarkable. Kidneys are normal, without renal  calculi, focal lesion, or hydronephrosis. Bladder is unremarkable.   Stomach/Bowel: Stomach is within normal limits. Appendix appears normal. There is marked gaseous distention of the colonic loops without significant wall thickening.   Vascular/Lymphatic: Aortic atherosclerosis. No enlarged abdominal or pelvic lymph nodes.   Reproductive: Uterus and bilateral adnexa are unremarkable.   Other: No abdominal wall hernia or abnormality. No abdominopelvic ascites.   Musculoskeletal: Mild multilevel degenerate disc disease of the lumbar spine. No acute osseous abnormality.   IMPRESSION: 1. Left lower lobe ground-glass opacities concerning for pneumonia. Follow-up examination to resolution is recommended. 2. Marked gaseous distention of the colonic loops without significant wall thickening. 3. Normal appendix. No evidence of colitis or diverticulitis. 4. Severe hepatic steatosis. 5. Generalized pancreatic atrophy with calcification, likely sequela of chronic pancreatitis. No pancreatic ductal dilatation.   Aortic Atherosclerosis   PAST GI PROCEDURES:  Colonoscopy with Dr. Bosie Clos in 09/2019:   - Preparation of the colon was unsatisfactory. - Diverticulosis in the entire examined colon. - One 4 mm polyp in the cecum. Resection not attempted. -  Internal hemorrhoids. - No specimens collected.   EGD by Dr. Bosie Clos in 09/2019:   - Normal esophagus. - Z-line regular. - Acute gastritis. - Normal examined duodenum.      Current Medications, Allergies, Past Medical History, Past Surgical History, Family History and Social History were reviewed in [employer] record.   Review of Systems:   Constitutional: Negative for fever, sweats, chills or weight loss.  Respiratory: Negative for shortness of breath.   Cardiovascular: Negative for chest pain, palpitations and leg swelling.  Gastrointestinal: See HPI.  Musculoskeletal: Negative for back pain or muscle  aches.  Neurological: Negative for dizziness, headaches or paresthesias.    Physical Exam: There were no vitals taken for this visit. General: in no acute distress. Head: Normocephalic and atraumatic. Eyes: No scleral icterus. Conjunctiva pink . Ears: Normal auditory acuity. Mouth: Dentition intact. No ulcers or lesions.  Lungs: Clear throughout to auscultation. Heart: Regular rate and rhythm, no murmur. Abdomen: Soft, nontender and nondistended. No masses or hepatomegaly. Normal bowel sounds x 4 quadrants.  Rectal: Deferred.  Musculoskeletal: Symmetrical with no gross deformities. Extremities: No edema. Neurological: Alert oriented x 4. No focal deficits.  Psychological: Alert and cooperative. Normal mood and affect  Assessment and Recommendations: ***

## 2022-10-14 ENCOUNTER — Emergency Department (HOSPITAL_COMMUNITY): Payer: MEDICAID

## 2022-10-14 ENCOUNTER — Inpatient Hospital Stay (HOSPITAL_COMMUNITY): Payer: MEDICAID

## 2022-10-14 ENCOUNTER — Inpatient Hospital Stay (HOSPITAL_COMMUNITY)
Admission: EM | Admit: 2022-10-14 | Discharge: 2022-10-27 | DRG: 871 | Disposition: A | Discharge status: Hospice | Payer: MEDICAID | Attending: Internal Medicine | Admitting: Internal Medicine

## 2022-10-14 ENCOUNTER — Other Ambulatory Visit: Payer: Self-pay

## 2022-10-14 DIAGNOSIS — R471 Dysarthria and anarthria: Secondary | ICD-10-CM | POA: Diagnosis present

## 2022-10-14 DIAGNOSIS — E162 Hypoglycemia, unspecified: Secondary | ICD-10-CM | POA: Diagnosis not present

## 2022-10-14 DIAGNOSIS — M7989 Other specified soft tissue disorders: Secondary | ICD-10-CM | POA: Diagnosis not present

## 2022-10-14 DIAGNOSIS — G9341 Metabolic encephalopathy: Secondary | ICD-10-CM | POA: Diagnosis present

## 2022-10-14 DIAGNOSIS — Z23 Encounter for immunization: Secondary | ICD-10-CM | POA: Diagnosis not present

## 2022-10-14 DIAGNOSIS — J45909 Unspecified asthma, uncomplicated: Secondary | ICD-10-CM | POA: Diagnosis present

## 2022-10-14 DIAGNOSIS — D6959 Other secondary thrombocytopenia: Secondary | ICD-10-CM | POA: Diagnosis present

## 2022-10-14 DIAGNOSIS — Z515 Encounter for palliative care: Secondary | ICD-10-CM

## 2022-10-14 DIAGNOSIS — F329 Major depressive disorder, single episode, unspecified: Secondary | ICD-10-CM | POA: Diagnosis present

## 2022-10-14 DIAGNOSIS — G40909 Epilepsy, unspecified, not intractable, without status epilepticus: Secondary | ICD-10-CM | POA: Diagnosis present

## 2022-10-14 DIAGNOSIS — E871 Hypo-osmolality and hyponatremia: Secondary | ICD-10-CM | POA: Diagnosis present

## 2022-10-14 DIAGNOSIS — J811 Chronic pulmonary edema: Secondary | ICD-10-CM | POA: Diagnosis present

## 2022-10-14 DIAGNOSIS — N170 Acute kidney failure with tubular necrosis: Secondary | ICD-10-CM | POA: Diagnosis present

## 2022-10-14 DIAGNOSIS — M545 Low back pain, unspecified: Secondary | ICD-10-CM | POA: Diagnosis present

## 2022-10-14 DIAGNOSIS — E860 Dehydration: Secondary | ICD-10-CM | POA: Diagnosis not present

## 2022-10-14 DIAGNOSIS — E861 Hypovolemia: Secondary | ICD-10-CM | POA: Diagnosis not present

## 2022-10-14 DIAGNOSIS — A419 Sepsis, unspecified organism: Secondary | ICD-10-CM | POA: Diagnosis not present

## 2022-10-14 DIAGNOSIS — A044 Other intestinal Escherichia coli infections: Secondary | ICD-10-CM | POA: Diagnosis present

## 2022-10-14 DIAGNOSIS — F10239 Alcohol dependence with withdrawal, unspecified: Secondary | ICD-10-CM | POA: Diagnosis not present

## 2022-10-14 DIAGNOSIS — Z66 Do not resuscitate: Secondary | ICD-10-CM | POA: Diagnosis not present

## 2022-10-14 DIAGNOSIS — D6489 Other specified anemias: Secondary | ICD-10-CM | POA: Diagnosis present

## 2022-10-14 DIAGNOSIS — K7460 Unspecified cirrhosis of liver: Secondary | ICD-10-CM | POA: Diagnosis present

## 2022-10-14 DIAGNOSIS — A4151 Sepsis due to Escherichia coli [E. coli]: Principal | ICD-10-CM | POA: Diagnosis present

## 2022-10-14 DIAGNOSIS — E43 Unspecified severe protein-calorie malnutrition: Secondary | ICD-10-CM | POA: Diagnosis present

## 2022-10-14 DIAGNOSIS — B9621 Shiga toxin-producing Escherichia coli [E. coli] (STEC) O157 as the cause of diseases classified elsewhere: Secondary | ICD-10-CM | POA: Diagnosis present

## 2022-10-14 DIAGNOSIS — I10 Essential (primary) hypertension: Secondary | ICD-10-CM | POA: Diagnosis present

## 2022-10-14 DIAGNOSIS — D5931 Infection-associated hemolytic-uremic syndrome: Secondary | ICD-10-CM | POA: Diagnosis present

## 2022-10-14 DIAGNOSIS — E8770 Fluid overload, unspecified: Secondary | ICD-10-CM | POA: Diagnosis not present

## 2022-10-14 DIAGNOSIS — D638 Anemia in other chronic diseases classified elsewhere: Secondary | ICD-10-CM | POA: Diagnosis present

## 2022-10-14 DIAGNOSIS — D593 Hemolytic-uremic syndrome, unspecified: Secondary | ICD-10-CM | POA: Diagnosis present

## 2022-10-14 DIAGNOSIS — E8721 Acute metabolic acidosis: Secondary | ICD-10-CM | POA: Diagnosis present

## 2022-10-14 DIAGNOSIS — K219 Gastro-esophageal reflux disease without esophagitis: Secondary | ICD-10-CM | POA: Diagnosis present

## 2022-10-14 DIAGNOSIS — G8929 Other chronic pain: Secondary | ICD-10-CM | POA: Diagnosis present

## 2022-10-14 DIAGNOSIS — Z7189 Other specified counseling: Secondary | ICD-10-CM | POA: Diagnosis not present

## 2022-10-14 DIAGNOSIS — R1312 Dysphagia, oropharyngeal phase: Secondary | ICD-10-CM | POA: Diagnosis present

## 2022-10-14 DIAGNOSIS — Z1152 Encounter for screening for COVID-19: Secondary | ICD-10-CM | POA: Diagnosis not present

## 2022-10-14 DIAGNOSIS — Z681 Body mass index (BMI) 19 or less, adult: Secondary | ICD-10-CM

## 2022-10-14 DIAGNOSIS — Z809 Family history of malignant neoplasm, unspecified: Secondary | ICD-10-CM

## 2022-10-14 DIAGNOSIS — R310 Gross hematuria: Secondary | ICD-10-CM | POA: Diagnosis not present

## 2022-10-14 DIAGNOSIS — E876 Hypokalemia: Secondary | ICD-10-CM | POA: Diagnosis present

## 2022-10-14 DIAGNOSIS — E8809 Other disorders of plasma-protein metabolism, not elsewhere classified: Secondary | ICD-10-CM | POA: Diagnosis present

## 2022-10-14 DIAGNOSIS — R6520 Severe sepsis without septic shock: Secondary | ICD-10-CM | POA: Diagnosis present

## 2022-10-14 DIAGNOSIS — N179 Acute kidney failure, unspecified: Secondary | ICD-10-CM | POA: Diagnosis present

## 2022-10-14 DIAGNOSIS — N10 Acute tubulo-interstitial nephritis: Secondary | ICD-10-CM | POA: Diagnosis present

## 2022-10-14 DIAGNOSIS — R64 Cachexia: Secondary | ICD-10-CM | POA: Diagnosis present

## 2022-10-14 DIAGNOSIS — Z781 Physical restraint status: Secondary | ICD-10-CM

## 2022-10-14 DIAGNOSIS — R0603 Acute respiratory distress: Secondary | ICD-10-CM | POA: Diagnosis not present

## 2022-10-14 DIAGNOSIS — Z880 Allergy status to penicillin: Secondary | ICD-10-CM

## 2022-10-14 DIAGNOSIS — Z79899 Other long term (current) drug therapy: Secondary | ICD-10-CM

## 2022-10-14 DIAGNOSIS — D509 Iron deficiency anemia, unspecified: Secondary | ICD-10-CM | POA: Diagnosis present

## 2022-10-14 DIAGNOSIS — K760 Fatty (change of) liver, not elsewhere classified: Secondary | ICD-10-CM | POA: Diagnosis present

## 2022-10-14 DIAGNOSIS — Z8701 Personal history of pneumonia (recurrent): Secondary | ICD-10-CM

## 2022-10-14 LAB — CBC WITH DIFFERENTIAL/PLATELET
Abs Immature Granulocytes: 0.45 10*3/uL — ABNORMAL HIGH (ref 0.00–0.07)
Basophils Absolute: 0 10*3/uL (ref 0.0–0.1)
Basophils Relative: 0 %
Eosinophils Absolute: 0 10*3/uL (ref 0.0–0.5)
Eosinophils Relative: 0 %
HCT: 28.4 % — ABNORMAL LOW (ref 36.0–46.0)
Hemoglobin: 10.2 g/dL — ABNORMAL LOW (ref 12.0–15.0)
Immature Granulocytes: 3 %
Lymphocytes Relative: 4 %
Lymphs Abs: 0.8 10*3/uL (ref 0.7–4.0)
MCH: 34.1 pg — ABNORMAL HIGH (ref 26.0–34.0)
MCHC: 35.9 g/dL (ref 30.0–36.0)
MCV: 95 fL (ref 80.0–100.0)
Monocytes Absolute: 0.9 10*3/uL (ref 0.1–1.0)
Monocytes Relative: 5 %
Neutro Abs: 15.1 10*3/uL — ABNORMAL HIGH (ref 1.7–7.7)
Neutrophils Relative %: 88 %
Platelets: 18 10*3/uL — CL (ref 150–400)
RBC: 2.99 MIL/uL — ABNORMAL LOW (ref 3.87–5.11)
RDW: 14.5 % (ref 11.5–15.5)
WBC: 17.2 10*3/uL — ABNORMAL HIGH (ref 4.0–10.5)
nRBC: 0 % (ref 0.0–0.2)

## 2022-10-14 LAB — CK: Total CK: 541 U/L — ABNORMAL HIGH (ref 38–234)

## 2022-10-14 LAB — PROTIME-INR
INR: 1.4 — ABNORMAL HIGH (ref 0.8–1.2)
Prothrombin Time: 17.5 s — ABNORMAL HIGH (ref 11.4–15.2)

## 2022-10-14 LAB — URINALYSIS, W/ REFLEX TO CULTURE (INFECTION SUSPECTED)
Glucose, UA: NEGATIVE mg/dL
Ketones, ur: NEGATIVE mg/dL
Nitrite: NEGATIVE
Protein, ur: 100 mg/dL — AB
Specific Gravity, Urine: 1.017 (ref 1.005–1.030)
WBC, UA: >50 WBC/hpf (ref 0–5)
pH: 5 (ref 5.0–8.0)

## 2022-10-14 LAB — BASIC METABOLIC PANEL
Anion gap: 20 — ABNORMAL HIGH (ref 5–15)
BUN: 63 mg/dL — ABNORMAL HIGH (ref 8–23)
CO2: 14 mmol/L — ABNORMAL LOW (ref 22–32)
Calcium: 7.7 mg/dL — ABNORMAL LOW (ref 8.9–10.3)
Chloride: 100 mmol/L (ref 98–111)
Creatinine, Ser: 4.96 mg/dL — ABNORMAL HIGH (ref 0.44–1.00)
GFR, Estimated: 9 mL/min — ABNORMAL LOW (ref >60)
Glucose, Bld: 84 mg/dL (ref 70–99)
Potassium: 2.1 mmol/L — CL (ref 3.5–5.1)
Sodium: 134 mmol/L — ABNORMAL LOW (ref 135–145)

## 2022-10-14 LAB — COMPREHENSIVE METABOLIC PANEL
ALT: 30 U/L (ref 0–44)
AST: 66 U/L — ABNORMAL HIGH (ref 15–41)
Albumin: 2.5 g/dL — ABNORMAL LOW (ref 3.5–5.0)
Alkaline Phosphatase: 144 U/L — ABNORMAL HIGH (ref 38–126)
Anion gap: 19 — ABNORMAL HIGH (ref 5–15)
BUN: 64 mg/dL — ABNORMAL HIGH (ref 8–23)
CO2: 15 mmol/L — ABNORMAL LOW (ref 22–32)
Calcium: 7.7 mg/dL — ABNORMAL LOW (ref 8.9–10.3)
Chloride: 99 mmol/L (ref 98–111)
Creatinine, Ser: 5.37 mg/dL — ABNORMAL HIGH (ref 0.44–1.00)
GFR, Estimated: 9 mL/min — ABNORMAL LOW (ref >60)
Glucose, Bld: 110 mg/dL — ABNORMAL HIGH (ref 70–99)
Potassium: 2 mmol/L — CL (ref 3.5–5.1)
Sodium: 133 mmol/L — ABNORMAL LOW (ref 135–145)
Total Bilirubin: 3.9 mg/dL — ABNORMAL HIGH (ref 0.3–1.2)
Total Protein: 6.9 g/dL (ref 6.5–8.1)

## 2022-10-14 LAB — I-STAT CG4 LACTIC ACID, ED
Lactic Acid, Venous: 4.4 mmol/L (ref 0.5–1.9)
Lactic Acid, Venous: 3.4 mmol/L (ref 0.5–1.9)

## 2022-10-14 LAB — ETHANOL: Alcohol, Ethyl (B): <10 mg/dL (ref <10)

## 2022-10-14 LAB — SARS CORONAVIRUS 2 BY RT PCR: SARS Coronavirus 2 by RT PCR: NEGATIVE

## 2022-10-14 LAB — MRSA NEXT GEN BY PCR, NASAL: MRSA by PCR Next Gen: NOT DETECTED

## 2022-10-14 LAB — LACTIC ACID, PLASMA: Lactic Acid, Venous: 4.2 mmol/L (ref 0.5–1.9)

## 2022-10-14 LAB — APTT: aPTT: 42 s — ABNORMAL HIGH (ref 24–36)

## 2022-10-14 LAB — MAGNESIUM: Magnesium: 0.7 mg/dL — CL (ref 1.7–2.4)

## 2022-10-14 MED ORDER — CHLORHEXIDINE GLUCONATE CLOTH 2 % EX PADS
6.0000 | MEDICATED_PAD | Freq: Every day | CUTANEOUS | Status: DC
  Administered 2022-10-14 – 2022-10-23 (×9): 6 via TOPICAL

## 2022-10-14 MED ORDER — POTASSIUM CHLORIDE 10 MEQ/100ML IV SOLN
10.0000 meq | INTRAVENOUS | Status: AC
  Administered 2022-10-14 – 2022-10-15 (×5): 10 meq via INTRAVENOUS
  Filled 2022-10-14 (×5): qty 100

## 2022-10-14 MED ORDER — SODIUM CHLORIDE 0.9 % IV SOLN: 1.0000 g | INTRAVENOUS | Status: DC

## 2022-10-14 MED ORDER — SODIUM CHLORIDE 0.9 % IV SOLN: 2.0000 g | Freq: Once | INTRAVENOUS | Status: DC

## 2022-10-14 MED ORDER — ACETAMINOPHEN 650 MG RE SUPP
650.0000 mg | Freq: Four times a day (QID) | RECTAL | Status: DC | PRN
  Administered 2022-10-15 – 2022-10-17 (×3): 650 mg via RECTAL
  Filled 2022-10-14 (×4): qty 1

## 2022-10-14 MED ORDER — ACETAMINOPHEN 325 MG PO TABS
650.0000 mg | ORAL_TABLET | Freq: Four times a day (QID) | ORAL | Status: DC | PRN
  Administered 2022-10-18 – 2022-10-20 (×4): 650 mg via ORAL
  Filled 2022-10-14 (×4): qty 2

## 2022-10-14 MED ORDER — ADULT MULTIVITAMIN W/MINERALS CH
1.0000 | ORAL_TABLET | Freq: Every day | ORAL | Status: DC
  Administered 2022-10-15: 1 via ORAL
  Filled 2022-10-14: qty 1

## 2022-10-14 MED ORDER — ACETAMINOPHEN 500 MG PO TABS
1000.0000 mg | ORAL_TABLET | Freq: Once | ORAL | Status: DC
  Filled 2022-10-14: qty 2

## 2022-10-14 MED ORDER — THIAMINE HCL 100 MG/ML IJ SOLN
500.0000 mg | Freq: Three times a day (TID) | INTRAVENOUS | Status: DC
  Administered 2022-10-14 – 2022-10-17 (×8): 500 mg via INTRAVENOUS
  Filled 2022-10-14 (×9): qty 5

## 2022-10-14 MED ORDER — LACTATED RINGERS IV BOLUS (SEPSIS)
250.0000 mL | Freq: Once | INTRAVENOUS | Status: AC
  Administered 2022-10-14: 250 mL via INTRAVENOUS

## 2022-10-14 MED ORDER — POTASSIUM CHLORIDE 20 MEQ PO PACK: 80.0000 meq | PACK | Freq: Once | ORAL | Status: DC

## 2022-10-14 MED ORDER — METRONIDAZOLE 500 MG/100ML IV SOLN
500.0000 mg | Freq: Once | INTRAVENOUS | Status: AC
  Administered 2022-10-14: 500 mg via INTRAVENOUS
  Filled 2022-10-14: qty 100

## 2022-10-14 MED ORDER — LORAZEPAM 1 MG PO TABS: 1.0000 mg | ORAL_TABLET | ORAL | Status: AC | PRN

## 2022-10-14 MED ORDER — POTASSIUM CHLORIDE CRYS ER 20 MEQ PO TBCR: 40.0000 meq | EXTENDED_RELEASE_TABLET | Freq: Once | ORAL | Status: DC

## 2022-10-14 MED ORDER — FOLIC ACID 1 MG PO TABS
1.0000 mg | ORAL_TABLET | Freq: Every day | ORAL | Status: DC
  Administered 2022-10-15: 1 mg via ORAL
  Filled 2022-10-14: qty 1

## 2022-10-14 MED ORDER — ACETAMINOPHEN 650 MG RE SUPP
650.0000 mg | Freq: Once | RECTAL | Status: AC
  Administered 2022-10-14: 650 mg via RECTAL
  Filled 2022-10-14: qty 1

## 2022-10-14 MED ORDER — POTASSIUM CHLORIDE 10 MEQ/100ML IV SOLN
10.0000 meq | INTRAVENOUS | Status: DC
  Administered 2022-10-14: 10 meq via INTRAVENOUS
  Filled 2022-10-14 (×2): qty 100

## 2022-10-14 MED ORDER — LORAZEPAM 2 MG/ML IJ SOLN
1.0000 mg | INTRAMUSCULAR | Status: AC | PRN
  Administered 2022-10-15 (×3): 1 mg via INTRAVENOUS
  Administered 2022-10-16 (×2): 2 mg via INTRAVENOUS
  Administered 2022-10-16: 1 mg via INTRAVENOUS
  Administered 2022-10-16 (×3): 2 mg via INTRAVENOUS
  Administered 2022-10-17 (×2): 1 mg via INTRAVENOUS
  Administered 2022-10-17 (×3): 2 mg via INTRAVENOUS
  Filled 2022-10-14 (×14): qty 1

## 2022-10-14 MED ORDER — THIAMINE HCL 100 MG/ML IJ SOLN
250.0000 mg | Freq: Every day | INTRAVENOUS | Status: AC
  Administered 2022-10-17 – 2022-10-21 (×5): 250 mg via INTRAVENOUS
  Filled 2022-10-14 (×7): qty 2.5

## 2022-10-14 MED ORDER — LACTATED RINGERS IV BOLUS (SEPSIS)
1000.0000 mL | Freq: Once | INTRAVENOUS | Status: AC
  Administered 2022-10-14: 1000 mL via INTRAVENOUS

## 2022-10-14 MED ORDER — LACTATED RINGERS IV SOLN: INTRAVENOUS | Status: DC

## 2022-10-14 MED ORDER — LACTATED RINGERS IV BOLUS
1000.0000 mL | Freq: Once | INTRAVENOUS | Status: AC
  Administered 2022-10-14: 1000 mL via INTRAVENOUS

## 2022-10-14 MED ORDER — VANCOMYCIN HCL IN DEXTROSE 1-5 GM/200ML-% IV SOLN
1000.0000 mg | Freq: Once | INTRAVENOUS | Status: AC
  Administered 2022-10-14: 1000 mg via INTRAVENOUS
  Filled 2022-10-14: qty 200

## 2022-10-14 MED ORDER — THIAMINE MONONITRATE 100 MG PO TABS: 100.0000 mg | ORAL_TABLET | Freq: Every day | ORAL | Status: DC

## 2022-10-14 MED ORDER — SODIUM CHLORIDE 0.9 % IV SOLN
2.0000 g | Freq: Once | INTRAVENOUS | Status: AC
  Administered 2022-10-14: 2 g via INTRAVENOUS
  Filled 2022-10-14: qty 12.5

## 2022-10-14 NOTE — ED Provider Notes (Signed)
[place] EMERGENCY DEPARTMENT AT [facility] Provider Note   CSN: [id number] Arrival date & time: 10/14/22  0900     History  Chief Complaint  Patient presents with   Generalized Body Aches    Brandy Bishop is a 62 y.o. female.  HPI Patient presents via EMS with weakness.  History is obtained by EMS, and history also provided to EMS by family individuals. Patient himself answers questions briefly, states that she feels sore all over, weak all over, without focality. EMS reports the patient was tachycardic, tachypneic and route.  Per history of family patient was in her usual state of health until a few days ago, no obvious precipitant, and patient has been declining since that time.     Home Medications Prior to Admission medications   Medication Sig Start Date End Date Taking? Authorizing Provider  amLODipine (NORVASC) 10 MG tablet Take 10 mg by mouth daily. 08/13/19   [provider]  cetirizine (ZYRTEC) 10 MG tablet Take 10 mg by mouth daily. 12/28/19   [provider]  ferrous sulfate 324 MG TBEC Take 324 mg by mouth daily with breakfast.    [provider]  folic acid (FOLVITE) 1 MG tablet Take 1 tablet (1 mg total) by mouth daily. Patient not taking: Reported on 06/30/2022 09/10/19   Lynn Ito, MD  losartan-hydrochlorothiazide (HYZAAR) 50-12.5 MG tablet Take 1 tablet by mouth daily.    [provider]  mirtazapine (REMERON) 7.5 MG tablet Take 7.5 mg by mouth at bedtime. 04/04/22 10/01/22  [provider]  omeprazole (PRILOSEC) 20 MG capsule Take 1 capsule (20 mg total) by mouth daily. 10/12/20 06/30/22  Linwood Dibbles, MD  ondansetron (ZOFRAN-ODT) 8 MG disintegrating tablet Take 8 mg by mouth 2 (two) times daily as needed for vomiting or nausea. 03/19/22   [provider]  thiamine 100 MG tablet Take 1 tablet (100 mg total) by mouth daily. 09/10/19   Lynn Ito, MD      Allergies    Penicillins    Review of  Systems   Review of Systems  All other systems reviewed and are negative.   Physical Exam Updated Vital Signs BP 106/60   Pulse (!) 116   Temp 99.6 F (37.6 C) (Oral)   Resp (!) 27   Ht 5\' 3"  (1.6 m)   Wt 39.8 kg   SpO2 100%   BMI 15.54 kg/m  Physical Exam Vitals and nursing note reviewed.  Constitutional:      General: She is not in acute distress.    Appearance: She is well-developed. She is ill-appearing.  HENT:     Head: Normocephalic and atraumatic.  Eyes:     Conjunctiva/sclera: Conjunctivae normal.  Cardiovascular:     Rate and Rhythm: Regular rhythm. Tachycardia present.  Pulmonary:     Effort: Pulmonary effort is normal. Tachypnea present. No respiratory distress.     Breath sounds: Normal breath sounds. No stridor.  Abdominal:     General: There is no distension.  Skin:    General: Skin is warm and dry.  Neurological:     Mental Status: She is alert and oriented to person, place, and time.     Cranial Nerves: No cranial nerve deficit.     Comments: Substantial diffuse atrophy.  Patient does follow commands appropriately, but states that she is too sore to move in all dimensions.  Psychiatric:        Mood and Affect: Mood normal.  ED Results / Procedures / Treatments   Labs (all labs ordered are listed, but only abnormal results are displayed) Labs Reviewed  COMPREHENSIVE METABOLIC PANEL - Abnormal; Notable for the following components:      Result Value   Sodium 133 (*)    Potassium 2.0 (*)    CO2 15 (*)    Glucose, Bld 110 (*)    BUN 64 (*)    Creatinine, Ser 5.37 (*)    Calcium 7.7 (*)    Albumin 2.5 (*)    AST 66 (*)    Alkaline Phosphatase 144 (*)    Total Bilirubin 3.9 (*)    GFR, Estimated 9 (*)    Anion gap 19 (*)    All other components within normal limits  CBC WITH DIFFERENTIAL/PLATELET - Abnormal; Notable for the following components:   WBC 17.2 (*)    RBC 2.99 (*)    Hemoglobin 10.2 (*)    HCT 28.4 (*)    MCH 34.1 (*)     Platelets 18 (*)    Neutro Abs 15.1 (*)    Abs Immature Granulocytes 0.45 (*)    All other components within normal limits  PROTIME-INR - Abnormal; Notable for the following components:   Prothrombin Time 17.5 (*)    INR 1.4 (*)    All other components within normal limits  APTT - Abnormal; Notable for the following components:   aPTT 42 (*)    All other components within normal limits  I-STAT CG4 LACTIC ACID, ED - Abnormal; Notable for the following components:   Lactic Acid, Venous 4.4 (*)    All other components within normal limits  CULTURE, BLOOD (ROUTINE X 2)  CULTURE, BLOOD (ROUTINE X 2)  ETHANOL  URINALYSIS, W/ REFLEX TO CULTURE (INFECTION SUSPECTED)  I-STAT CG4 LACTIC ACID, ED    EKG EKG Interpretation Date/Time:  Monday October 14 2022 09:08:43 EDT Ventricular Rate:  131 PR Interval:  143 QRS Duration:  83 QT Interval:  314 QTC Calculation: 464 R Axis:   -13  Text Interpretation: Sinus tachycardia Probable left atrial enlargement Low voltage, extremity leads Probable left ventricular hypertrophy Confirmed by Gerhard Munch [phone]) on 10/14/2022 11:24:54 AM  Radiology DG Chest Port 1 View  Result Date: 10/14/2022 CLINICAL DATA:  Possible sepsis. Generalized body ache. Feeling sick over the last 3-4 days. Mental status changes and confusion. Sinus tachycardia. EXAM: PORTABLE CHEST 1 VIEW COMPARISON:  02/05/2022 FINDINGS: Heart size is normal. Mediastinal shadows are normal. Artifact overlies the chest. The patient has taken a poor inspiration. Allowing for that, the lungs are probably clear. No visible pleural fluid. No abnormal bone finding. IMPRESSION: Poor inspiration. Allowing for that, no active disease suspected. Electronically Signed   By: Paulina Fusi M.D.   On: 10/14/2022 11:26   [place] EKG SITE RITE  Result Date: 10/14/2022 If Site Rite image not attached, placement could not be confirmed due to current cardiac rhythm.   Procedures Procedures    Medications  Ordered in ED Medications  lactated ringers infusion (has no administration in time range)  lactated ringers bolus 1,000 mL (0 mLs Intravenous Stopped 10/14/22 1308)    And  lactated ringers bolus 250 mL (has no administration in time range)  lactated ringers bolus 1,000 mL (has no administration in time range)  metroNIDAZOLE (FLAGYL) IVPB 500 mg (0 mg Intravenous Stopped 10/14/22 1309)  vancomycin (VANCOCIN) IVPB 1000 mg/200 mL premix (1,000 mg Intravenous New Bag/Given 10/14/22 1504)  ceFEPIme (MAXIPIME) 2 g in  sodium chloride 0.9 % 100 mL IVPB (0 g Intravenous Stopped 10/14/22 1541)  acetaminophen (TYLENOL) suppository 650 mg (650 mg Rectal Given 10/14/22 1042)    ED Course/ Medical Decision Making/ A&P                                 Medical Decision Making Elderly appearing female with multiple medical issues, hospitalization last year for pneumonia, alcohol counseling presents with weakness.  Patient is found to be tachycardic, tachypneic, meets multiple SIRS criteria.  No obvious initial source, but concern for sepsis versus dehydration versus alcohol use disorder or withdrawal.  Patient had labs monitoring fluids. Cardiac 130 sinus tach abnormal Pulse ox 99% room air normal   Amount and/or Complexity of Data Reviewed Independent Historian: EMS External Data Reviewed: notes.    Details: Admission May of this year with pneumonia Labs: ordered. Decision-making details documented in ED Course. Radiology: ordered and independent interpretation performed. Decision-making details documented in ED Course. ECG/medicine tests: ordered and independent interpretation performed. Decision-making details documented in ED Course.  Risk OTC drugs. Prescription drug management. Decision regarding hospitalization. Diagnosis or treatment significantly limited by social determinants of health.   Update: Patient now accompanied by family members, who contributed to history.  Update: Patient lactic  acidosis 4.4. Attempts at IV access have been fruitless.  Angiocath insertion Performed by: Gerhard Munch  Consent: Verbal consent obtained. Risks and benefits: risks, benefits and alternatives were discussed Time out: Immediately prior to procedure a "time out" was called to verify the correct patient, procedure, equipment, support staff and site/side marked as required.  Preparation: Patient was prepped and draped in the usual sterile fashion.  Vein Location: R deepbrachial  Ultrasound Guided  Gauge: 20  Normal blood return and flush without difficulty Patient tolerance: Patient tolerated the procedure well with no immediate complications.  Update: Deep brachial IV no longer viable.  Patient had left-sided ultrasound placed by nursing staff.  She has received fluid resuscitation, and now has been seen by our critical care colleagues. With concern for sepsis, acute kidney injury, hypokalemia patient appropriate for stepdown admission per critical care.  Patient is remained awake and alert, though minimally hypotensive throughout.  This has improved, and her tachycardia is resolved with initial fluid resuscitation.          Final Clinical Impression(s) / ED Diagnoses Final diagnoses:  Severe sepsis (HCC)  AKI (acute kidney injury) (HCC)    CRITICAL CARE Performed by: Gerhard Munch Total critical care time: 45 minutes Critical care time was exclusive of separately billable procedures and treating other patients. Critical care was necessary to treat or prevent imminent or life-threatening deterioration. Critical care was time spent personally by me on the following activities: development of treatment plan with patient and/or surrogate as well as nursing, discussions with consultants, evaluation of patient's response to treatment, examination of patient, obtaining history from patient or surrogate, ordering and performing treatments and interventions, ordering and review  of laboratory studies, ordering and review of radiographic studies, pulse oximetry and re-evaluation of patient's condition.    Gerhard Munch, MD 10/14/22 1620

## 2022-10-14 NOTE — Progress Notes (Signed)
Dear Doctor: This patient has been identified as a candidate for CVC for the following reason (s): poor veins/poor circulatory system (CHF, COPD, emphysema, diabetes, steroid use, IV drug abuse, etc.) GFR of 9. If you agree, please write an order for the indicated device.   Thank you for supporting the early vascular access assessment program.

## 2022-10-14 NOTE — ED Notes (Signed)
Provider successfully placed US guided PIV.

## 2022-10-14 NOTE — H&P (Addendum)
History and Physical    Brandy Bishop ZOX:[id number] DOB: 22-Nov-1960 DOA: 10/14/2022  PCP: Cline Crock, NP  Patient coming from: home  I have personally briefly reviewed patient's old medical records in [facility]  Chief Complaint: generalized weakness, sore  HPI: Brandy Bishop is Brandy Bishop 62 y.o. female with medical history significant of etoh abuse, pancreatitis, seizures, hypertension and multiple other medical issues presenting with generalized weakness.   History obtained from chart and discussion with her significant other.  Limited from her due to her dysarthria.   She notes her symptoms started about 4 days ago or so.  Notes she couldn't walk.  Felt sore all over.  She notes she was sick and threw up as well as had diarrhea, but her significant other denied noting these symptoms.  Her significant other notes she's been laying on the couch for the past 4 days, not eating or drinking.  He notes she's been complaining of R thigh pain.  Notes complaints of chills as well.  She was brought in by EMS with generalized aches and malaise x3-4 days.  Family noted increasing confusion.  Family notes signifcant etoh use, last drink was yesterday.  ED Course: labs, CXR, IVF, abx.  PCCM c/s.  Admit to hospitalist for AKI, sepsis, dehydration.   Review of Systems: As per HPI otherwise all other systems reviewed and are negative.  Past Medical History:  Diagnosis Date   Alcoholism (HCC) 2011   Asthma    Chronic lower back pain    Colitis 04/2014.   Bloody diarrhea   Daily headache    GIB (gastrointestinal bleeding)    Hypertension    Pancreatitis    Chronic pancreatitis noted on CT scan in 04/2014.   Polysubstance abuse (HCC) 2011   Pyelonephritis 05/2012   Seizures (HCC)    last seizure was 04/2019 per patient     Past Surgical History:  Procedure Laterality Date   COLONOSCOPY WITH PROPOFOL N/[name] 09/09/2019   Procedure: COLONOSCOPY WITH PROPOFOL;  Surgeon: Charlott Rakes, MD;  Location: [facility] ENDOSCOPY;  Service: Endoscopy;  Laterality: [name];   ESOPHAGOGASTRODUODENOSCOPY N/[name] 05/31/2015   Procedure: ESOPHAGOGASTRODUODENOSCOPY (EGD);  Surgeon: Hilarie Fredrickson, MD;  Location: [facility] ENDOSCOPY;  Service: Endoscopy;  Laterality: [name];   ESOPHAGOGASTRODUODENOSCOPY N/[name] 09/09/2019   Procedure: ESOPHAGOGASTRODUODENOSCOPY (EGD);  Surgeon: Charlott Rakes, MD;  Location: [facility] ENDOSCOPY;  Service: Endoscopy;  Laterality: [name];   ORIF HUMERUS FRACTURE Right 05/12/2019   Procedure: OPEN REDUCTION INTERNAL FIXATION (ORIF) DISTAL HUMERUS FRACTURE;  Surgeon: Bjorn Pippin, MD;  Location: [facility] ORS;  Service: Orthopedics;  Laterality: Right;    Social History  reports that she has never smoked. She has never used smokeless tobacco. She reports that she does not currently use alcohol after [name] past usage of about 10.0 standard drinks of alcohol per week. She reports that she does not use drugs.  Allergies  Allergen Reactions   Penicillins Swelling    .Has patient had [name] PCN reaction causing immediate rash, facial/tongue/throat swelling, SOB or lightheadedness with hypotension: Yes Has patient had [name] PCN reaction causing severe rash involving mucus membranes or skin necrosis: No Has patient had [name] PCN reaction that required hospitalization No Has patient had [name] PCN reaction occurring within the last 10 years: No If all of the above answers are "NO", then may proceed with Cephalosporin use.     Family History  Problem Relation Age of Onset   Cancer Mother    Cancer Brother  Prior to Admission medications   Medication Sig Start Date End Date Taking? Authorizing Provider  amLODipine (NORVASC) 10 MG tablet Take 10 mg by mouth daily. 08/13/19  Yes [provider]  cetirizine (ZYRTEC) 10 MG tablet Take 10 mg by mouth daily. 12/28/19  Yes [provider]  ferrous sulfate 324 MG TBEC Take 324 mg by mouth daily with breakfast.   Yes [provider]   losartan-hydrochlorothiazide (HYZAAR) 50-12.5 MG tablet Take 1 tablet by mouth daily.   Yes [provider]  mirtazapine (REMERON) 7.5 MG tablet Take 7.5 mg by mouth at bedtime. 04/04/22 10/14/22 Yes [provider]  omeprazole (PRILOSEC) 20 MG capsule Take 1 capsule (20 mg total) by mouth daily. 10/12/20 10/14/22 Yes Linwood Dibbles, MD  ondansetron (ZOFRAN-ODT) 8 MG disintegrating tablet Take 8 mg by mouth 2 (two) times daily as needed for vomiting or nausea. 03/19/22  Yes [provider]  thiamine 100 MG tablet Take 1 tablet (100 mg total) by mouth daily. 09/10/19  Yes Lynn Ito, MD    Physical Exam: Vitals:   10/14/22 1030 10/14/22 1430 10/14/22 1508 10/14/22 1545  BP: 108/74 110/61 94/62 106/60  Pulse: (!) 55  (!) 116   Resp: (!) 34 (!) 28 (!) 24 (!) 27  Temp:   99.6 F (37.6 C)   TempSrc:   Oral   SpO2: 98%  100%   Weight:      Height:        Constitutional: NAD, calm, comfortable Vitals:   10/14/22 1030 10/14/22 1430 10/14/22 1508 10/14/22 1545  BP: 108/74 110/61 94/62 106/60  Pulse: (!) 55  (!) 116   Resp: (!) 34 (!) 28 (!) 24 (!) 27  Temp:   99.6 F (37.6 C)   TempSrc:   Oral   SpO2: 98%  100%   Weight:      Height:       Eyes: PERRL, lids and conjunctivae normal ENMT: Mucous membranes are moist. Posterior pharynx clear of any exudate or lesions.Normal dentition.  Neck: normal, supple, no masses, no thyromegaly Respiratory: clear to auscultation bilaterally, no wheezing, no crackles. Normal respiratory effort. No accessory muscle use.  Cardiovascular: Regular rate and rhythm, no murmurs / rubs / gallops. No extremity edema. 2+ pedal pulses. No carotid bruits.  Abdomen: no tenderness, no masses palpated. No hepatosplenomegaly. Bowel sounds positive.  Musculoskeletal: no clubbing / cyanosis. No joint deformity upper and lower extremities. Good ROM, no contractures. Normal muscle tone.  Skin: no rashes, lesions, ulcers. No induration Neurologic: CN  2-12 grossly intact. Sensation intact, DTR normal. Strength 5/5 in all 4.  Psychiatric: Normal judgment and insight. Alert and oriented x 3. Normal mood.   Labs on Admission: I have personally reviewed following labs and imaging studies  CBC: Recent Labs  Lab 10/14/22 0909  WBC 17.2*  NEUTROABS 15.1*  HGB 10.2*  HCT 28.4*  MCV 95.0  PLT 18*    Basic Metabolic Panel: Recent Labs  Lab 10/14/22 0909  NA 133*  K 2.0*  CL 99  CO2 15*  GLUCOSE 110*  BUN 64*  CREATININE 5.37*  CALCIUM 7.7*    GFR: Estimated Creatinine Clearance: 6.9 mL/min (Pike Scantlebury) (by C-G formula based on SCr of 5.37 mg/dL (H)).  Liver Function Tests: Recent Labs  Lab 10/14/22 0909  AST 66*  ALT 30  ALKPHOS 144*  BILITOT 3.9*  PROT 6.9  ALBUMIN 2.5*    Urine analysis:    Component Value Date/Time   COLORURINE AMBER (  Roda Lauture) 10/14/2022 1600   APPEARANCEUR TURBID (Napolean Sia) 10/14/2022 1600   LABSPEC 1.017 10/14/2022 1600   PHURINE 5.0 10/14/2022 1600   GLUCOSEU NEGATIVE 10/14/2022 1600   HGBUR SMALL (Vasilis Luhman) 10/14/2022 1600   BILIRUBINUR SMALL (Travarus Trudo) 10/14/2022 1600   KETONESUR NEGATIVE 10/14/2022 1600   PROTEINUR 100 (Tauni Sanks) 10/14/2022 1600   UROBILINOGEN 0.2 04/14/2014 1419   NITRITE NEGATIVE 10/14/2022 1600   LEUKOCYTESUR MODERATE (Juelz Claar) 10/14/2022 1600    Radiological Exams on Admission: DG Chest Port 1 View  Result Date: 10/14/2022 CLINICAL DATA:  Possible sepsis. Generalized body ache. Feeling sick over the last 3-4 days. Mental status changes and confusion. Sinus tachycardia. EXAM: PORTABLE CHEST 1 VIEW COMPARISON:  02/05/2022 FINDINGS: Heart size is normal. Mediastinal shadows are normal. Artifact overlies the chest. The patient has taken Nelida Mandarino poor inspiration. Allowing for that, the lungs are probably clear. No visible pleural fluid. No abnormal bone finding. IMPRESSION: Poor inspiration. Allowing for that, no active disease suspected. Electronically Signed   By: Paulina Fusi M.D.   On: 10/14/2022 11:26   [place] EKG SITE  RITE  Result Date: 10/14/2022 If Site Rite image not attached, placement could not be confirmed due to current cardiac rhythm.   EKG: Independently reviewed. Sinus tachycardia  Assessment/Plan Principal Problem:   Sepsis (HCC) Active Problems:   Hypovolemia   Dehydration   AKI (acute kidney injury) (HCC)    Assessment and Plan:  Sepsis from suspected Urinary Source  Meets criteria with fever, leukocytosis, tachycardia, tachypnea -> With pyuria, seems most likely source at this time is urinary  CXR without active disease. CT abdomen/pelvis without contrast pending Follow RUQ [place] with elevated bili  UA with squams -> concerning for UTI, follow culture Follow blood culture  Pending covid, covid testing testing  Continue aggressive IVF Will continue antibiotics with ceftiraxone Appreciate PCCM eval   Acute Kidney Injury  Anion Gap Metabolic Acidosis  Lactic Acidosis Due to above, likely hypovolemic as well Anion gap metabolic acidosis due to AKI and lactic acidosis UA with 0-5 RBC, 100 mg/dl protein Follow CT abd/pelvis Consider renal consult if not improving  Severe Hypokalemia  Replace cautiously with renal failure and follow   Generalized Weakness  Dysarthria Sig other notes slurred speech x4 days or so Will follow head CT (consider MRI once more stable) Will need additional workup based on results, potential neuro consult SLP eval   Etoh Abuse Last drink yesterday per significant other  High dose thiamine CIWA  Abnormal LFT's Elevated AST, bili Follow pending CT abd/pelvis Follow RUQ [place]   Thrombocytopenia  Chronic, related to etoh use probably Maybe worsening in setting of sepsis Will trend, no si/sx bleeding     DVT prophylaxis: SCD  Code Status:   full  Family Communication:  Sig other  Disposition Plan:   Patient is from:  home  Anticipated DC to:  pending  Anticipated DC date:  unknown  Anticipated DC barriers: Pending improvement   Consults  called:  PCCM  Admission status:  inpatient   Severity of Illness: The appropriate patient status for this patient is INPATIENT. Inpatient status is judged to be reasonable and necessary in order to provide the required intensity of service to ensure the patient's safety. The patient's presenting symptoms, physical exam findings, and initial radiographic and laboratory data in the context of their chronic comorbidities is felt to place them at high risk for further clinical deterioration. Furthermore, it is not anticipated that the patient will be medically stable for discharge  from the hospital within 2 midnights of admission.   * I certify that at the point of admission it is my clinical judgment that the patient will require inpatient hospital care spanning beyond 2 midnights from the point of admission due to high intensity of service, high risk for further deterioration and high frequency of surveillance required.*  50 min critical care time with sepsis, severe hypokalemia, AKI   Lacretia Nicks MD [facility]  How to contact the [facility] Attending or Consulting provider 7A - 7P or covering provider during after hours 7P -7A, for this patient?   Check the care team in [facility] and look for Marcell Pfeifer) attending/consulting [facility] provider listed and b) the [facility] team listed Log into [email or web address] and use [place]'s universal password to access. If you do not have the password, please contact the hospital operator. Locate the [facility] provider you are looking for under [facility] and page to Fong Mccarry number that you can be directly reached. If you still have difficulty reaching the provider, please page the [facility] (Director on Call) for the Hospitalists listed on amion for assistance.  10/14/2022, 5:44 PM

## 2022-10-14 NOTE — Consult Note (Signed)
NAME:  Brandy Bishop, MRN:  [id number], DOB:  1960-03-17, LOS: 0 ADMISSION DATE:  10/14/2022, CONSULTATION DATE:  10/14/22 REFERRING MD:  Jeraldine Loots CHIEF COMPLAINT:  Weakness, dehydration   History of Present Illness:  Brandy Bishop is a 62 y.o. female who has a PMH as below. She presented to [facility] ED 9/9 with weakness and aches. She has had decreased PO intake x 2 - 3 days due to no appetite and weakness. She denies any recent fevers/chills/sweats, chest pain, dyspnea, abd pain, N/V/D, abd pain, myalgias.  She was found to have new AKI, hypokalemia, hyponatremia, elevated lactate, AGMA. She was also significantly dehydrated on ED presentation.  BP remained stable throughout. PCCM asked to see in consultation given AKI, lactic acidosis.  She appears very dry/volume deplete and can certainly tolerate further.   Pertinent  Medical History:  has Viral URI with cough; Pyelonephritis; Seizure disorder (HCC); Asthma with bronchitis; HTN (hypertension); GI bleed; Liver disease; ETOH abuse; Chronic pancreatitis (HCC); Protein-calorie malnutrition, severe (HCC); Bleeding gastrointestinal; Abnormal CT scan, colon; Rectal bleeding; Abdominal pain; Alcoholic hepatitis without ascites; Severe protein-calorie malnutrition (HCC); Hypomagnesemia; Hypokalemia; Thrombocytopenia (HCC); Seizure (HCC); Cough; Subtherapeutic phenytoin level; Acute upper GI bleed; Gastroesophageal reflux disease with esophagitis; Acute GI bleeding; Polysubstance abuse (HCC); Acute liver failure; Colon cancer screening; CAP (community acquired pneumonia); and Sepsis (HCC) on their problem list.  Significant Hospital Events: Including procedures, antibiotic start and stop dates in addition to other pertinent events   9/9 admit.  Interim History / Subjective:  Asking for water. Feels weak. Able to answer basic questions though does mumble.  Objective:  Blood pressure 106/60, pulse (!) 116, temperature 99.6 F (37.6 C),  temperature source Oral, resp. rate (!) 27, height 5\' 3"  (1.6 m), weight 39.8 kg, SpO2 100%.        Intake/Output Summary (Last 24 hours) at 10/14/2022 1641 Last data filed at 10/14/2022 1541 Gross per 24 hour  Intake 796.86 ml  Output --  Net 796.86 ml   Filed Weights   10/14/22 0941  Weight: 39.8 kg    Examination: General: Adult female, chronically ill appearing, in NAD. Neuro: Awake, alert, answers questions appropriately but mumbles. HEENT: [place]/AT. Sclerae anicteric. MM very dry. Cardiovascular: RRR, no M/R/G.  Lungs: Respirations even and unlabored.  CTA bilaterally, No W/R/R. Abdomen: BS x 4, soft, NT/ND.  Musculoskeletal: Muscle wasting, no edema.  Skin: Skin tenting throughout. Skin warm, no rashes.  Assessment & Plan:   AKI. Hyponatremia. AKI. AG/lactic acidosis. Leukocytosis. Chronic thrombocytopenia 2/2 EtOH and hepatic steatosis. Fever - resolved after several hours but given hx of generalized aches/weakness, consider flu.  Discussion. She appears very volume deplete/dry on exam and endorses decreased PO intake x several days. She would benefit from additional aggressive fluid resuscitation. Suspect that her AKI will improve as she is rehydrated. Repeat lactate after additional fluid bolus has completed. Will order K liquid. Repeat BMP tonight and in AM. Leukocytosis likely reactive, doubt infectious. Defer abx. Reasonable to get UA. Check flu and COVID though low suspicion.   OK for [facility] admission. No ICU needs and nothing further to add.  PCCM will sign off.  Please call us back if we can be of any further assistance.  Best practice (evaluated daily):  Per [facility].  Labs   CBC: Recent Labs  Lab 10/14/22 0909  WBC 17.2*  NEUTROABS 15.1*  HGB 10.2*  HCT 28.4*  MCV 95.0  PLT 18*    Basic Metabolic Panel: Recent Labs  Lab 10/14/22 0909  NA 133*  K 2.0*  CL 99  CO2 15*  GLUCOSE 110*  BUN 64*  CREATININE 5.37*  CALCIUM 7.7*    GFR: Estimated Creatinine Clearance: 6.9 mL/min (A) (by C-G formula based on SCr of 5.37 mg/dL (H)). Recent Labs  Lab 10/14/22 0909 10/14/22 1005  WBC 17.2*  --   LATICACIDVEN  --  4.4*    Liver Function Tests: Recent Labs  Lab 10/14/22 0909  AST 66*  ALT 30  ALKPHOS 144*  BILITOT 3.9*  PROT 6.9  ALBUMIN 2.5*   No results for input(s): "LIPASE", "AMYLASE" in the last 168 hours. No results for input(s): "AMMONIA" in the last 168 hours.  ABG    Component Value Date/Time   TCO2 29 07/07/2012 1514     Coagulation Profile: Recent Labs  Lab 10/14/22 0909  INR 1.4*    Cardiac Enzymes: No results for input(s): "CKTOTAL", "CKMB", "CKMBINDEX", "TROPONINI" in the last 168 hours.  HbA1C: No results found for: "HGBA1C"  CBG: No results for input(s): "GLUCAP" in the last 168 hours.  Review of Systems:   All negative; except for those that are bolded, which indicate positives.  Constitutional: weight loss, weight gain, night sweats, fevers, chills, fatigue, weakness, general aches. HEENT: headaches, sore throat, sneezing, nasal congestion, post nasal drip, difficulty swallowing, tooth/dental problems, visual complaints, visual changes, ear aches. Neuro: difficulty with speech, weakness, numbness, ataxia. CV:  chest pain, orthopnea, PND, swelling in lower extremities, dizziness, palpitations, syncope.  Resp: cough, hemoptysis, dyspnea, wheezing. GI: heartburn, indigestion, abdominal pain, nausea, vomiting, diarrhea, constipation, change in bowel habits, loss of appetite, hematemesis, melena, hematochezia.  GU: dysuria, change in color of urine, urgency or frequency, flank pain, hematuria. MSK: joint pain or swelling, decreased range of motion. Psych: change in mood or affect, depression, anxiety, suicidal ideations, homicidal ideations. Skin: rash, itching, bruising.   Past Medical History:  She,  has a past medical history of Alcoholism (HCC) (2011), Asthma, Chronic  lower back pain, Colitis (04/2014.), Daily headache, GIB (gastrointestinal bleeding), Hypertension, Pancreatitis, Polysubstance abuse (HCC) (2011), Pyelonephritis (05/2012), and Seizures (HCC).   Surgical History:   Past Surgical History:  Procedure Laterality Date   COLONOSCOPY WITH PROPOFOL N/A 09/09/2019   Procedure: COLONOSCOPY WITH PROPOFOL;  Surgeon: Charlott Rakes, MD;  Location: [facility] ENDOSCOPY;  Service: Endoscopy;  Laterality: N/A;   ESOPHAGOGASTRODUODENOSCOPY N/A 05/31/2015   Procedure: ESOPHAGOGASTRODUODENOSCOPY (EGD);  Surgeon: Hilarie Fredrickson, MD;  Location: [facility] ENDOSCOPY;  Service: Endoscopy;  Laterality: N/A;   ESOPHAGOGASTRODUODENOSCOPY N/A 09/09/2019   Procedure: ESOPHAGOGASTRODUODENOSCOPY (EGD);  Surgeon: Charlott Rakes, MD;  Location: [facility] ENDOSCOPY;  Service: Endoscopy;  Laterality: N/A;   ORIF HUMERUS FRACTURE Right 05/12/2019   Procedure: OPEN REDUCTION INTERNAL FIXATION (ORIF) DISTAL HUMERUS FRACTURE;  Surgeon: Bjorn Pippin, MD;  Location: [facility] ORS;  Service: Orthopedics;  Laterality: Right;     Social History:   reports that she has never smoked. She has never used smokeless tobacco. She reports that she does not currently use alcohol after a past usage of about 10.0 standard drinks of alcohol per week. She reports that she does not use drugs.   Family History:  Her family history includes Cancer in her brother and mother.   Allergies Allergies  Allergen Reactions   Penicillins Swelling    .Has patient had a PCN reaction causing immediate rash, facial/tongue/throat swelling, SOB or lightheadedness with hypotension: Yes Has patient had a PCN reaction causing severe rash involving mucus membranes or skin necrosis: No  Has patient had a PCN reaction that required hospitalization No Has patient had a PCN reaction occurring within the last 10 years: No If all of the above answers are "NO", then may proceed with Cephalosporin use.      Home Medications  Prior to  Admission medications   Medication Sig Start Date End Date Taking? Authorizing Provider  amLODipine (NORVASC) 10 MG tablet Take 10 mg by mouth daily. 08/13/19   [provider]  cetirizine (ZYRTEC) 10 MG tablet Take 10 mg by mouth daily. 12/28/19   [provider]  ferrous sulfate 324 MG TBEC Take 324 mg by mouth daily with breakfast.    [provider]  folic acid (FOLVITE) 1 MG tablet Take 1 tablet (1 mg total) by mouth daily. Patient not taking: Reported on 06/30/2022 09/10/19   Lynn Ito, MD  losartan-hydrochlorothiazide (HYZAAR) 50-12.5 MG tablet Take 1 tablet by mouth daily.    [provider]  mirtazapine (REMERON) 7.5 MG tablet Take 7.5 mg by mouth at bedtime. 04/04/22 10/01/22  [provider]  omeprazole (PRILOSEC) 20 MG capsule Take 1 capsule (20 mg total) by mouth daily. 10/12/20 06/30/22  Linwood Dibbles, MD  ondansetron (ZOFRAN-ODT) 8 MG disintegrating tablet Take 8 mg by mouth 2 (two) times daily as needed for vomiting or nausea. 03/19/22   [provider]  thiamine 100 MG tablet Take 1 tablet (100 mg total) by mouth daily. 09/10/19   Lynn Ito, MD      Rutherford Guys, PA - [facility] For pager details, please see AMION or use Epic chat  After 1900, please call [facility] for cross coverage needs 10/14/2022, 4:41 PM

## 2022-10-14 NOTE — Sepsis Progress Note (Signed)
Elink will follow per sepsis protocol  

## 2022-10-14 NOTE — Progress Notes (Signed)
ED Pharmacy Antibiotic Sign Off An antibiotic consult was received from an ED provider for azactam and vancomycin per pharmacy dosing for sepsis. A chart review was completed to assess appropriateness. Patient previously tolerated ceftriaxone in 06/2022, will change azactam to one time cefepime per protocol  The following one time order(s) were placed:  Vancomycin 1000mg  IV x1 Cefepime 2G IV x1  Further antibiotic and/or antibiotic pharmacy consults should be ordered by the admitting provider if indicated.   Thank you for allowing pharmacy to be a part of this patient's care.   Smitty Cords, [facility]  Clinical Pharmacist 10/14/22 9:47 AM

## 2022-10-14 NOTE — ED Notes (Signed)
Provider at bedside with [place] machine attempting to place PIV.

## 2022-10-14 NOTE — Progress Notes (Signed)
Called and spoke with patient's nurse. Notified d/t GFR of 9. At this time VAST PICC team unable to place a PICC. Nurse VU. Tomasita Morrow, RN VAST

## 2022-10-14 NOTE — Procedures (Signed)
Central Venous Catheter Insertion Procedure Note  Brandy Bishop  [id number]  06/07/1960  Date:10/14/22  Time:11:49 PM   Provider Performing:Brandy Bishop Brandy Bishop   Procedure: Insertion of Non-tunneled Central Venous [id number]) with US guidance (40347)   Indication(s) Difficult access  Consent Risks of the procedure as well as the alternatives and risks of each were explained to the patient and/or caregiver.  Consent for the procedure was obtained and is signed in the bedside chart  Anesthesia Topical only with 1% lidocaine   Timeout Verified patient identification, verified procedure, site/side was marked, verified correct patient position, special equipment/implants available, medications/allergies/relevant history reviewed, required imaging and test results available.  Sterile Technique Maximal sterile technique including full sterile barrier drape, hand hygiene, sterile gown, sterile gloves, mask, hair covering, sterile ultrasound probe cover (if used).  Procedure Description Area of catheter insertion was cleaned with chlorhexidine and draped in sterile fashion.  With real-time ultrasound guidance a central venous catheter was placed into the right femoral vein. Placement of guidewire within R femoral vein verified by [place] prior to dilation of vessel and placement of central venous catheter.  Nonpulsatile blood flow and easy flushing noted in all ports.  The catheter was sutured in place and sterile dressing applied.  Complications/Tolerance None; patient tolerated the procedure well.   Chest x-ray is not ordered for femoral cannulation, verified by ultrasound x 2 views.   EBL Minimal  Specimen(s) None   Brandy Fass MSN, AGACNP-BC [facility] Pulmonary/Critical Care Medicine Amion for pager  10/14/2022, 11:50 PM

## 2022-10-14 NOTE — ED Notes (Signed)
ED [place] trained RN at bedside attempting to place PIV.

## 2022-10-14 NOTE — ED Triage Notes (Signed)
Pt BIB EMS from home for general body aches and feeling ill for 3-4 days. Family reports pt has increasingly not been herself and is confused. A&Ox2-time-purpose. Mumbling is baseline for patient.   EMS VS 130 ST 94/64 BP BG 94 99.56f temporal

## 2022-10-14 NOTE — Progress Notes (Signed)
Left PIV infiltrated.  No IV access noted, see IV team note.  Patient still have 5 runs of KCL to be infused and thiamine.  Patient refusing lab sticks.  Dr. Joneen Roach text paged, waiting response.

## 2022-10-14 NOTE — ED Notes (Signed)
IV team RN at bedside. Unable to place PIV. Provider made aware. Provider requesting another ED [place] trained RN to attempt to place PIV prior to his attempt.

## 2022-10-14 NOTE — ED Notes (Signed)
Provider aware unable to place PIV.

## 2022-10-14 NOTE — ED Notes (Signed)
Pt's PIV infiltrated.

## 2022-10-14 NOTE — ED Notes (Signed)
ED TO INPATIENT HANDOFF REPORT  ED Nurse Name and Phone #: Osvaldo Shipper RN [phone]  S Name/Age/Gender Brandy Bishop 62 y.o. female Room/Bed: 011C/011C  Code Status   Code Status: Prior  Home/SNF/Other Home Patient oriented to: self Is this baseline? No   Triage Complete: Triage complete  Chief Complaint Sepsis [facility]) [A41.9]  Triage Note Pt BIB EMS from home for general body aches and feeling ill for 3-4 days. Family reports pt has increasingly not been herself and is confused. A&Ox2-time-purpose. Mumbling is baseline for patient.   EMS VS 130 ST 94/64 BP BG 94 99.66f temporal    Allergies Allergies  Allergen Reactions   Penicillins Swelling    .Has patient had a PCN reaction causing immediate rash, facial/tongue/throat swelling, SOB or lightheadedness with hypotension: Yes Has patient had a PCN reaction causing severe rash involving mucus membranes or skin necrosis: No Has patient had a PCN reaction that required hospitalization No Has patient had a PCN reaction occurring within the last 10 years: No If all of the above answers are "NO", then may proceed with Cephalosporin use.     Level of Care/Admitting Diagnosis ED Disposition     ED Disposition  Admit   Condition  --   Comment  Hospital Area: [facility] [[id number]]  Level of Care: Progressive [102]  Admit to Progressive based on following criteria: MULTISYSTEM THREATS such as stable sepsis, metabolic/electrolyte imbalance with or without encephalopathy that is responding to early treatment.  May admit patient to [facility] or [facility] if equivalent level of care is available:: No  Covid Evaluation: Asymptomatic - no recent exposure (last 10 days) testing not required  Diagnosis: Sepsis [facility]) [[id number]]  Admitting Physician: Zigmund Daniel [phone]  Attending Physician: Shaune Spittle, Vanice Sarah [phone]  Certification:: I certify this patient will need inpatient services for at  least 2 midnights  Expected Medical Readiness: 10/17/2022          B Medical/Surgery History Past Medical History:  Diagnosis Date   Alcoholism (HCC) 2011   Asthma    Chronic lower back pain    Colitis 04/2014.   Bloody diarrhea   Daily headache    GIB (gastrointestinal bleeding)    Hypertension    Pancreatitis    Chronic pancreatitis noted on CT scan in 04/2014.   Polysubstance abuse (HCC) 2011   Pyelonephritis 05/2012   Seizures (HCC)    last seizure was 04/2019 per patient    Past Surgical History:  Procedure Laterality Date   COLONOSCOPY WITH PROPOFOL N/A 09/09/2019   Procedure: COLONOSCOPY WITH PROPOFOL;  Surgeon: Charlott Rakes, MD;  Location[facility] ENDOSCOPY;  Service: Endoscopy;  Laterality: N/A;   ESOPHAGOGASTRODUODENOSCOPY N/A 05/31/2015   Procedure: ESOPHAGOGASTRODUODENOSCOPY (EGD);  Surgeon: Hilarie Fredrickson, MD;  Location: [facility] ENDOSCOPY;  Service: Endoscopy;  Laterality: N/A;   ESOPHAGOGASTRODUODENOSCOPY N/A 09/09/2019   Procedure: ESOPHAGOGASTRODUODENOSCOPY (EGD);  Surgeon: Charlott Rakes, MD;  Location: [facility] ENDOSCOPY;  Service: Endoscopy;  Laterality: N/A;   ORIF HUMERUS FRACTURE Right 05/12/2019   Procedure: OPEN REDUCTION INTERNAL FIXATION (ORIF) DISTAL HUMERUS FRACTURE;  Surgeon: Bjorn Pippin, MD;  Location: [facility] ORS;  Service: Orthopedics;  Laterality: Right;     A IV Location/Drains/Wounds Patient Lines/Drains/Airways Status     Active Line/Drains/Airways     Name Placement date Placement time Site Days   Peripheral IV 10/14/22 20 G Left Antecubital 10/14/22  1457  Antecubital  less than 1   Urethral Catheter Katori Wirsing K RN 14 Fr.  10/14/22  1559  --  less than 1            Intake/Output Last 24 hours  Intake/Output Summary (Last 24 hours) at 10/14/2022 1648 Last data filed at 10/14/2022 1643 Gross per 24 hour  Intake 996.86 ml  Output --  Net 996.86 ml    Labs/Imaging Results for orders placed or performed during the hospital encounter of  10/14/22 (from the past 48 hour(s))  Comprehensive metabolic panel     Status: Abnormal   Collection Time: 10/14/22  9:09 AM  Result Value Ref Range   Sodium 133 (L) 135 - 145 mmol/L   Potassium 2.0 (LL) 3.5 - 5.1 mmol/L    Comment: CRITICAL RESULT CALLED TO, READ BACK BY AND VERIFIED WITH: Keefer Soulliere,RN AT 1134 10/14/2022 BY ZBEECH.    Chloride 99 98 - 111 mmol/L   CO2 15 (L) 22 - 32 mmol/L   Glucose, Bld 110 (H) 70 - 99 mg/dL    Comment: Glucose reference range applies only to samples taken after fasting for at least 8 hours.   BUN 64 (H) 8 - 23 mg/dL   Creatinine, Ser 1.61 (H) 0.44 - 1.00 mg/dL   Calcium 7.7 (L) 8.9 - 10.3 mg/dL   Total Protein 6.9 6.5 - 8.1 g/dL   Albumin 2.5 (L) 3.5 - 5.0 g/dL   AST 66 (H) 15 - 41 U/L   ALT 30 0 - 44 U/L   Alkaline Phosphatase 144 (H) 38 - 126 U/L   Total Bilirubin 3.9 (H) 0.3 - 1.2 mg/dL   GFR, Estimated 9 (L) >60 mL/min    Comment: (NOTE) Calculated using the CKD-EPI Creatinine Equation (2021)    Anion gap 19 (H) 5 - 15    Comment: ELECTROLYTES REPEATED TO VERIFY Performed at [facility], [address], [place], [place] [address]   CBC with Differential     Status: Abnormal   Collection Time: 10/14/22  9:09 AM  Result Value Ref Range   WBC 17.2 (H) 4.0 - 10.5 K/uL   RBC 2.99 (L) 3.87 - 5.11 MIL/uL   Hemoglobin 10.2 (L) 12.0 - 15.0 g/dL   HCT 54.0 (L) 98.1 - 19.1 %   MCV 95.0 80.0 - 100.0 fL   MCH 34.1 (H) 26.0 - 34.0 pg   MCHC 35.9 30.0 - 36.0 g/dL   RDW 47.8 29.5 - 62.1 %   Platelets 18 (LL) 150 - 400 K/uL    Comment: Immature Platelet Fraction may be clinically indicated, consider ordering this additional test [id number] REPEATED TO VERIFY THIS CRITICAL RESULT HAS VERIFIED AND BEEN CALLED TO Elma Limas,C RN BY AMANDA LEONARD ON 09 09 2024 AT 1037, AND HAS BEEN READ BACK.     nRBC 0.0 0.0 - 0.2 %   Neutrophils Relative % 88 %   Neutro Abs 15.1 (H) 1.7 - 7.7 K/uL   Lymphocytes Relative 4 %   Lymphs Abs 0.8 0.7 -  4.0 K/uL   Monocytes Relative 5 %   Monocytes Absolute 0.9 0.1 - 1.0 K/uL   Eosinophils Relative 0 %   Eosinophils Absolute 0.0 0.0 - 0.5 K/uL   Basophils Relative 0 %   Basophils Absolute 0.0 0.0 - 0.1 K/uL   WBC Morphology Mild Left Shift (1-5% metas, occ myelo)    RBC Morphology MORPHOLOGY UNREMARKABLE    Immature Granulocytes 3 %   Abs Immature Granulocytes 0.45 (H) 0.00 - 0.07 K/uL    Comment: Performed at [facility] Lab,  [address], [place], [place] [address]  Protime-INR     Status: Abnormal   Collection Time: 10/14/22  9:09 AM  Result Value Ref Range   Prothrombin Time 17.5 (H) 11.4 - 15.2 seconds   INR 1.4 (H) 0.8 - 1.2    Comment: (NOTE) INR goal varies based on device and disease states. Performed at [facility], [address] [place], [place] [address]   APTT     Status: Abnormal   Collection Time: 10/14/22  9:09 AM  Result Value Ref Range   aPTT 42 (H) 24 - 36 seconds    Comment:        IF BASELINE aPTT IS ELEVATED, SUGGEST PATIENT RISK ASSESSMENT BE USED TO DETERMINE APPROPRIATE ANTICOAGULANT THERAPY. Performed at [facility], [address] [place], [place] [address]   Ethanol     Status: None   Collection Time: 10/14/22  9:25 AM  Result Value Ref Range   Alcohol, Ethyl (B) <10 <10 mg/dL    Comment: (NOTE) Lowest detectable limit for serum alcohol is 10 mg/dL.  For medical purposes only. Performed at [facility], [address], [place], [place] [address]   I-Stat Lactic Acid, ED     Status: Abnormal   Collection Time: 10/14/22 10:05 AM  Result Value Ref Range   Lactic Acid, Venous 4.4 (HH) 0.5 - 1.9 mmol/L   Comment NOTIFIED PHYSICIAN    DG Chest Port 1 View  Result Date: 10/14/2022 CLINICAL DATA:  Possible sepsis. Generalized body ache. Feeling sick over the last 3-4 days. Mental status changes and confusion. Sinus tachycardia. EXAM: PORTABLE CHEST 1 VIEW COMPARISON:  02/05/2022 FINDINGS: Heart size is normal.  Mediastinal shadows are normal. Artifact overlies the chest. The patient has taken a poor inspiration. Allowing for that, the lungs are probably clear. No visible pleural fluid. No abnormal bone finding. IMPRESSION: Poor inspiration. Allowing for that, no active disease suspected. Electronically Signed   By: Paulina Fusi M.D.   On: 10/14/2022 11:26   [place] EKG SITE RITE  Result Date: 10/14/2022 If Site Rite image not attached, placement could not be confirmed due to current cardiac rhythm.   Pending Labs Unresulted Labs (From admission, onward)     Start     Ordered   10/14/22 0909  Blood Culture (routine x 2)  (Undifferentiated presentation (screening labs and basic nursing orders))  BLOOD CULTURE X 2,   STAT      10/14/22 0908   10/14/22 0909  Urinalysis, w/ Reflex to Culture (Infection Suspected) -Urine, Clean Catch  (Undifferentiated presentation (screening labs and basic nursing orders))  ONCE - URGENT,   URGENT       Question:  Specimen Source  Answer:  Urine, Clean Catch   10/14/22 0908            Vitals/Pain Today's Vitals   10/14/22 1030 10/14/22 1430 10/14/22 1508 10/14/22 1545  BP: 108/74 110/61 94/62 106/60  Pulse: (!) 55  (!) 116   Resp: (!) 34 (!) 28 (!) 24 (!) 27  Temp:   99.6 F (37.6 C)   TempSrc:   Oral   SpO2: 98%  100%   Weight:      Height:      PainSc:   0-No pain     Isolation Precautions No active isolations  Medications Medications  lactated ringers infusion ( Intravenous New Bag/Given 10/14/22 1645)  lactated ringers bolus 1,000 mL (0 mLs Intravenous Stopped 10/14/22 1308)  And  lactated ringers bolus 250 mL (has no administration in time range)  potassium chloride (KLOR-CON) packet 80 mEq (80 mEq Oral Not Given 10/14/22 1646)  metroNIDAZOLE (FLAGYL) IVPB 500 mg (0 mg Intravenous Stopped 10/14/22 1309)  vancomycin (VANCOCIN) IVPB 1000 mg/200 mL premix (0 mg Intravenous Stopped 10/14/22 1643)  ceFEPIme (MAXIPIME) 2 g in sodium chloride 0.9 % 100 mL IVPB  (0 g Intravenous Stopped 10/14/22 1541)  acetaminophen (TYLENOL) suppository 650 mg (650 mg Rectal Given 10/14/22 1042)  lactated ringers bolus 1,000 mL (1,000 mLs Intravenous New Bag/Given 10/14/22 1643)    Mobility non-ambulatory     Focused Assessments Sepsis   R Recommendations: See Admitting Provider Note  Report given to:   Additional Notes:

## 2022-10-14 NOTE — Progress Notes (Signed)
Second page to Dr. Joneen Roach for orders, no IV access, HR 130's ST, b p 101/86.  Patient NPO for failure of swallow eval.  Last K 2.0, labs redrawn at 2134, waiting results.

## 2022-10-14 NOTE — ED Notes (Signed)
PICC team called this RN to state they will not place midline or PICC with GRF less than 30 to prevent upper arms for potential future dialysis. Provider made aware.

## 2022-10-14 NOTE — ED Notes (Addendum)
Pt called 3x with no response for room placement. Pt not currently seen in lobby.

## 2022-10-14 NOTE — ED Notes (Addendum)
Unable to place PIV, attempted multiple times. Provider aware.

## 2022-10-15 ENCOUNTER — Inpatient Hospital Stay (HOSPITAL_COMMUNITY): Payer: MEDICAID

## 2022-10-15 DIAGNOSIS — A419 Sepsis, unspecified organism: Secondary | ICD-10-CM | POA: Diagnosis not present

## 2022-10-15 DIAGNOSIS — R6520 Severe sepsis without septic shock: Secondary | ICD-10-CM | POA: Diagnosis not present

## 2022-10-15 LAB — BLOOD CULTURE ID PANEL (REFLEXED) - BCID2

## 2022-10-15 LAB — CBC
HCT: 21.7 % — ABNORMAL LOW (ref 36.0–46.0)
HCT: 19.3 % — ABNORMAL LOW (ref 36.0–46.0)
Hemoglobin: 8 g/dL — ABNORMAL LOW (ref 12.0–15.0)
Hemoglobin: 7.2 g/dL — ABNORMAL LOW (ref 12.0–15.0)
MCH: 33.8 pg (ref 26.0–34.0)
MCH: 33.8 pg (ref 26.0–34.0)
MCHC: 36.9 g/dL — ABNORMAL HIGH (ref 30.0–36.0)
MCHC: 37.3 g/dL — ABNORMAL HIGH (ref 30.0–36.0)
MCV: 91.6 fL (ref 80.0–100.0)
MCV: 90.6 fL (ref 80.0–100.0)
Platelets: 6 10*3/uL — CL (ref 150–400)
Platelets: 15 10*3/uL — CL (ref 150–400)
RBC: 2.37 MIL/uL — ABNORMAL LOW (ref 3.87–5.11)
RBC: 2.13 MIL/uL — ABNORMAL LOW (ref 3.87–5.11)
RDW: 14.2 % (ref 11.5–15.5)
RDW: 14.3 % (ref 11.5–15.5)
WBC: 14.7 10*3/uL — ABNORMAL HIGH (ref 4.0–10.5)
WBC: 12.2 10*3/uL — ABNORMAL HIGH (ref 4.0–10.5)
nRBC: 0 % (ref 0.0–0.2)
nRBC: 0 % (ref 0.0–0.2)

## 2022-10-15 LAB — RESP PANEL BY RT-PCR (FLU A&B, COVID) ARPGX2
Influenza A by PCR: NEGATIVE
Influenza B by PCR: NEGATIVE
SARS Coronavirus 2 by RT PCR: NEGATIVE

## 2022-10-15 LAB — COMPREHENSIVE METABOLIC PANEL
ALT: 23 U/L (ref 0–44)
AST: 49 U/L — ABNORMAL HIGH (ref 15–41)
Albumin: 1.8 g/dL — ABNORMAL LOW (ref 3.5–5.0)
Alkaline Phosphatase: 84 U/L (ref 38–126)
Anion gap: 13 (ref 5–15)
BUN: 65 mg/dL — ABNORMAL HIGH (ref 8–23)
CO2: 14 mmol/L — ABNORMAL LOW (ref 22–32)
Calcium: 7.4 mg/dL — ABNORMAL LOW (ref 8.9–10.3)
Chloride: 105 mmol/L (ref 98–111)
Creatinine, Ser: 4.43 mg/dL — ABNORMAL HIGH (ref 0.44–1.00)
GFR, Estimated: 11 mL/min — ABNORMAL LOW (ref >60)
Glucose, Bld: 73 mg/dL (ref 70–99)
Potassium: 2.1 mmol/L — CL (ref 3.5–5.1)
Sodium: 132 mmol/L — ABNORMAL LOW (ref 135–145)
Total Bilirubin: 4.2 mg/dL — ABNORMAL HIGH (ref 0.3–1.2)
Total Protein: 5.1 g/dL — ABNORMAL LOW (ref 6.5–8.1)

## 2022-10-15 LAB — DIC (DISSEMINATED INTRAVASCULAR COAGULATION)PANEL
D-Dimer, Quant: 10.97 ug{FEU}/mL — ABNORMAL HIGH (ref 0.00–0.50)
Fibrinogen: 590 mg/dL — ABNORMAL HIGH (ref 210–475)
INR: 1.5 — ABNORMAL HIGH (ref 0.8–1.2)
Platelets: <5 10*3/uL — CL (ref 150–400)
Prothrombin Time: 18.3 s — ABNORMAL HIGH (ref 11.4–15.2)
Smear Review: NONE SEEN
aPTT: 43 s — ABNORMAL HIGH (ref 24–36)

## 2022-10-15 LAB — IRON AND TIBC
Iron: 6 ug/dL — ABNORMAL LOW (ref 28–170)
Saturation Ratios: 5 % — ABNORMAL LOW (ref 10.4–31.8)
TIBC: 126 ug/dL — ABNORMAL LOW (ref 250–450)
UIBC: 120 ug/dL

## 2022-10-15 LAB — RETICULOCYTES
Immature Retic Fract: 2 % — ABNORMAL LOW (ref 2.3–15.9)
RBC.: 2.16 MIL/uL — ABNORMAL LOW (ref 3.87–5.11)
Retic Count, Absolute: 13.2 10*3/uL — ABNORMAL LOW (ref 19.0–186.0)
Retic Ct Pct: 0.6 % (ref 0.4–3.1)

## 2022-10-15 LAB — CREATININE, URINE, RANDOM: Creatinine, Urine: 57 mg/dL

## 2022-10-15 LAB — TECHNOLOGIST SMEAR REVIEW
Plt Morphology: NORMAL
RBC MORPHOLOGY: NONE SEEN

## 2022-10-15 LAB — RENAL FUNCTION PANEL
Albumin: 1.7 g/dL — ABNORMAL LOW (ref 3.5–5.0)
Anion gap: 19 — ABNORMAL HIGH (ref 5–15)
BUN: 63 mg/dL — ABNORMAL HIGH (ref 8–23)
CO2: 18 mmol/L — ABNORMAL LOW (ref 22–32)
Calcium: 7.6 mg/dL — ABNORMAL LOW (ref 8.9–10.3)
Chloride: 97 mmol/L — ABNORMAL LOW (ref 98–111)
Creatinine, Ser: 4 mg/dL — ABNORMAL HIGH (ref 0.44–1.00)
GFR, Estimated: 12 mL/min — ABNORMAL LOW (ref >60)
Glucose, Bld: 188 mg/dL — ABNORMAL HIGH (ref 70–99)
Phosphorus: 3.4 mg/dL (ref 2.5–4.6)
Potassium: 2.8 mmol/L — ABNORMAL LOW (ref 3.5–5.1)
Sodium: 134 mmol/L — ABNORMAL LOW (ref 135–145)

## 2022-10-15 LAB — NA AND K (SODIUM & POTASSIUM), RAND UR
Potassium Urine: 41 mmol/L
Sodium, Ur: 38 mmol/L

## 2022-10-15 LAB — FOLATE: Folate: 13.5 ng/mL (ref >5.9)

## 2022-10-15 LAB — MAGNESIUM: Magnesium: 2.2 mg/dL (ref 1.7–2.4)

## 2022-10-15 LAB — FERRITIN: Ferritin: 590 ng/mL — ABNORMAL HIGH (ref 11–307)

## 2022-10-15 LAB — PHOSPHORUS: Phosphorus: 1.8 mg/dL — ABNORMAL LOW (ref 2.5–4.6)

## 2022-10-15 LAB — LACTATE DEHYDROGENASE: LDH: 289 U/L — ABNORMAL HIGH (ref 98–192)

## 2022-10-15 LAB — VITAMIN B12: Vitamin B-12: 894 pg/mL (ref 180–914)

## 2022-10-15 LAB — IMMATURE PLATELET FRACTION: Immature Platelet Fraction: 7.3 % (ref 1.2–8.6)

## 2022-10-15 LAB — SEDIMENTATION RATE: Sed Rate: 85 mm/h — ABNORMAL HIGH (ref 0–22)

## 2022-10-15 MED ORDER — POTASSIUM CHLORIDE 10 MEQ/100ML IV SOLN
10.0000 meq | INTRAVENOUS | Status: AC
  Administered 2022-10-15 (×6): 10 meq via INTRAVENOUS
  Filled 2022-10-15 (×6): qty 100

## 2022-10-15 MED ORDER — SODIUM CHLORIDE 0.9% IV SOLUTION: Freq: Once | INTRAVENOUS | Status: AC

## 2022-10-15 MED ORDER — SODIUM BICARBONATE 8.4 % IV SOLN
INTRAVENOUS | Status: DC
  Filled 2022-10-15 (×3): qty 1000

## 2022-10-15 MED ORDER — POTASSIUM CHLORIDE 10 MEQ/100ML IV SOLN
10.0000 meq | INTRAVENOUS | Status: AC
  Administered 2022-10-15 – 2022-10-16 (×6): 10 meq via INTRAVENOUS
  Filled 2022-10-15 (×6): qty 100

## 2022-10-15 MED ORDER — POTASSIUM CHLORIDE CRYS ER 20 MEQ PO TBCR
40.0000 meq | EXTENDED_RELEASE_TABLET | ORAL | Status: AC
  Administered 2022-10-15: 40 meq via ORAL
  Filled 2022-10-15: qty 2

## 2022-10-15 MED ORDER — MAGNESIUM SULFATE 4 GM/100ML IV SOLN
4.0000 g | Freq: Once | INTRAVENOUS | Status: AC
  Administered 2022-10-15: 4 g via INTRAVENOUS
  Filled 2022-10-15: qty 100

## 2022-10-15 MED ORDER — POTASSIUM PHOSPHATES 15 MMOLE/5ML IV SOLN
15.0000 mmol | Freq: Once | INTRAVENOUS | Status: AC
  Administered 2022-10-15: 15 mmol via INTRAVENOUS
  Filled 2022-10-15: qty 5

## 2022-10-15 MED ORDER — SODIUM CHLORIDE 0.9 % IV SOLN
2.0000 g | INTRAVENOUS | Status: DC
  Administered 2022-10-15 – 2022-10-26 (×12): 2 g via INTRAVENOUS
  Filled 2022-10-15 (×12): qty 20

## 2022-10-15 MED ORDER — POTASSIUM CHLORIDE IN NACL 40-0.9 MEQ/L-% IV SOLN
INTRAVENOUS | Status: DC
Start: 2022-10-15 — End: 2022-10-15

## 2022-10-15 MED ORDER — LACTATED RINGERS IV BOLUS: 1000.0000 mL | Freq: Once | INTRAVENOUS | Status: DC

## 2022-10-15 NOTE — Procedures (Signed)
Modified Barium Swallow Study  Patient Details  Name: Brandy Bishop MRN: [id number] Date of Birth: 05-May-1960  Today's Date: 10/15/2022  Modified Barium Swallow completed.  Full report located under Chart Review in the Imaging Section.  History of Present Illness Pt is a 62 y.o. female who presented with generalized aches and malaise x3-4 days. She noted nausea, emesis, and diarreha, but her significant other denied noting these symptoms. Increasing confusion noted from family. PMH is significant for etoh abuse, panreatitis, seizures, hypertension, and GIB.   Clinical Impression Patient presents with a moderate oral dysphagia and mild pharyngeal phase dysphagia as per this MBS. In addition, cognitive impairment impacted study, as patient was constantly moving her head around and required cues to reposition as well as to initiate swallow. Boluses of thin liquid, nectar thick liquids and puree solids were administered. Swallow was initiated at level of vallecular sinus. Epiglottic inversion was complete but anterior hyoid excursion and laryngeal elevation appeared partial in completion. Lingual control of bolus and tongue base retraction were both impaired, leading to poor bolus cohesion, reduced anterior to posterior oral transit of boluses. Mild amount of residuals observed in vallecular sinus, aryepiglottic folds, posterior pharyngeal wall and pyriform sinus with liquid boluses and moderate amount of vallecular residuals with puree solids were observed. Residuals. started to clear with subsequent swallows. Appearance of narrowing of space at PES observed which resulted in mild amount of barium remaining above PES after initial swallows before clearing during subsequent swallows. During esophageal sweep, appearance of decreased bolus cohesion and slow transit of puree solid barium observed. No aspiration or penetration seen. SLP recommending to continue with full liquids (thin) diet and will follow  for toleration and ability to advance. Factors that may increase risk of adverse event in presence of aspiration [employer] 2021): Frail or deconditioned;Reduced cognitive function;Weak cough;Poor general health and/or compromised immunity  Swallow Evaluation Recommendations Recommendations: PO diet PO Diet Recommendation: Full liquid diet;Thin liquids (Level 0) Liquid Administration via: Cup;Straw Medication Administration: Crushed with puree Supervision: Full supervision/cueing for swallowing strategies;Staff to assist with self-feeding;Patient able to self-feed Swallowing strategies  : Slow rate;Small bites/sips;Minimize environmental distractions Postural changes: Position pt fully upright for meals;Stay upright 30-60 min after meals Oral care recommendations: Oral care BID (2x/day)   Angela Nevin, MA, CCC-SLP Speech Therapy

## 2022-10-15 NOTE — Progress Notes (Signed)
PHARMACY - PHYSICIAN COMMUNICATION CRITICAL VALUE ALERT - BLOOD CULTURE IDENTIFICATION (BCID)  Brandy Bishop is an 62 y.o. female who presented to [facility] on 10/14/2022 with a chief complaint of urosepsis  Assessment:   Blood cultures growing E. Coli  Name of physician (or Provider) Contacted:  Dr. Joneen Roach  Current antibiotics:  Rocephin  Changes to prescribed antibiotics recommended:  Change Rocephin 2 g IV q24h for bacteremia  Results for orders placed or performed during the hospital encounter of 10/14/22  Blood Culture ID Panel (Reflexed) (Collected: 10/14/2022  9:40 AM)  Result Value Ref Range   Enterococcus faecalis NOT DETECTED NOT DETECTED   Enterococcus Faecium NOT DETECTED NOT DETECTED   Listeria monocytogenes NOT DETECTED NOT DETECTED   Staphylococcus species NOT DETECTED NOT DETECTED   Staphylococcus aureus (BCID) NOT DETECTED NOT DETECTED   Staphylococcus epidermidis NOT DETECTED NOT DETECTED   Staphylococcus lugdunensis NOT DETECTED NOT DETECTED   Streptococcus species NOT DETECTED NOT DETECTED   Streptococcus agalactiae NOT DETECTED NOT DETECTED   Streptococcus pneumoniae NOT DETECTED NOT DETECTED   Streptococcus pyogenes NOT DETECTED NOT DETECTED   A.calcoaceticus-baumannii NOT DETECTED NOT DETECTED   Bacteroides fragilis NOT DETECTED NOT DETECTED   Enterobacterales DETECTED (A) NOT DETECTED   Enterobacter cloacae complex NOT DETECTED NOT DETECTED   Escherichia coli DETECTED (A) NOT DETECTED   Klebsiella aerogenes NOT DETECTED NOT DETECTED   Klebsiella oxytoca NOT DETECTED NOT DETECTED   Klebsiella pneumoniae NOT DETECTED NOT DETECTED   Proteus species NOT DETECTED NOT DETECTED   Salmonella species NOT DETECTED NOT DETECTED   Serratia marcescens NOT DETECTED NOT DETECTED   Haemophilus influenzae NOT DETECTED NOT DETECTED   Neisseria meningitidis NOT DETECTED NOT DETECTED   Pseudomonas aeruginosa NOT DETECTED NOT DETECTED   Stenotrophomonas maltophilia  NOT DETECTED NOT DETECTED   Candida albicans NOT DETECTED NOT DETECTED   Candida auris NOT DETECTED NOT DETECTED   Candida glabrata NOT DETECTED NOT DETECTED   Candida krusei NOT DETECTED NOT DETECTED   Candida parapsilosis NOT DETECTED NOT DETECTED   Candida tropicalis NOT DETECTED NOT DETECTED   Cryptococcus neoformans/gattii NOT DETECTED NOT DETECTED   CTX-M ESBL NOT DETECTED NOT DETECTED   Carbapenem resistance IMP NOT DETECTED NOT DETECTED   Carbapenem resistance KPC NOT DETECTED NOT DETECTED   Carbapenem resistance NDM NOT DETECTED NOT DETECTED   Carbapenem resist OXA 48 LIKE NOT DETECTED NOT DETECTED   Carbapenem resistance VIM NOT DETECTED NOT DETECTED    Eddie Candle 10/15/2022  12:38 AM

## 2022-10-15 NOTE — Progress Notes (Signed)
Patient staring to have blood in urine. Notified Attending, got orders to monitor patient.

## 2022-10-15 NOTE — Progress Notes (Signed)
Right femoral TL CVC placed by Delight Stare, NP.  Ok to use by the same.  IVF restarted, Potassium and thiamine resumed.  HR 130-140's ST, Critical mag level of .07 called to RN.  Notified Dr. Joneen Roach.

## 2022-10-15 NOTE — Plan of Care (Signed)
  Problem: Education: Goal: Knowledge of General Education information will improve Description: Including pain rating scale, medication(s)/side effects and non-pharmacologic comfort measures Outcome: Progressing   Problem: Clinical Measurements: Goal: Respiratory complications will improve Outcome: Progressing   Problem: Coping: Goal: Level of anxiety will decrease Outcome: Progressing   Problem: Elimination: Goal: Will not experience complications related to bowel motility Outcome: Progressing Goal: Will not experience complications related to urinary retention Outcome: Not Progressing   Problem: Pain Managment: Goal: General experience of comfort will improve Outcome: Progressing   Problem: Safety: Goal: Ability to remain free from injury will improve Outcome: Progressing

## 2022-10-15 NOTE — Progress Notes (Addendum)
PROGRESS NOTE    Brandy Bishop  ZOX:[id number] DOB: [birth year] DOA: 10/14/2022 PCP: Cline Crock, NP   Brief Narrative:  62 year old female with history of alcohol abuse, pancreatitis, seizures, hypertension presented with generalized weakness and shortness along with nausea, vomiting and diarrhea along with confusion.  Reliability of the history on presentation was poor and patient was dysarthric on initial exam (significant other reported that her last alcoholic drink was 1 day prior to presentation).  On presentation, patient was hypotensive with creatinine of 5.37, potassium of 2, sodium of 133, bicarb of 15, alkaline phosphatase 144, AST 66 with WBCs of 17.2, platelets of 18 and lactic acid of 4.4.  UA was suggestive of UTI.  She was started on IV fluids and antibiotics.  PCCM was consulted who recommended IV fluids and antibiotics and admission to [facility] service.  CT of head without contrast showed no acute abnormality.  CT of abdomen and pelvis without contrast showed possible bilateral perinephric stranding, could be related to pyelonephritis; hepatic steatosis with probable cirrhosis along with diverticular disease of the colon without wall thickening; possibility of diarrheal illness.  Right upper quadrant ultrasound showed hepatic steatosis and or hepatocellular disease.  Influenza/COVID-19 testing was negative.  Assessment & Plan:   Severe sepsis: Present on admission Probable acute bilateral pyelonephritis E. coli bacteremia Lactic acidosis: Present on admission Leukocytosis: Improving -Presented with fever, leukocytosis, tachycardia, tachypnea with pyuria and imaging evidence of possible acute bilateral pyelonephritis.  Influenza/COVID-19 testing negative.  Chest x-ray without any active disease. -Blood culture positive for E. coli.  Follow sensitivities.  Continue Rocephin.  Continue IV fluids as below. -WBCs improving: Monitor  Acute kidney injury Acute metabolic  acidosis -Creatinine 5.37 on presentation; was 0.55 on 07/02/2022. -Creatinine improving to 4.43 today.  Bicarb 14 today.  I have started the patient on bicarb drip. -No mention of hydronephrosis on CT of abdomen. -I have consulted nephrology today.  Follow recommendations. -Repeat a.m. labs  Acute metabolic encephalopathy Generalized weakness Dysarthria -Patient apparently was having slurred speech for 4 days prior to presentation.  CT of the head was negative for acute intracranial abnormity. -Unclear if her symptoms were secondary to bacteremia/severe sepsis/chronic alcohol use.  If symptoms persist despite medical treatment, will need MRI of brain. -Fall precautions.  PT evaluation once more stable.  SLP eval.  Hepatic steatosis with probable cirrhosis Elevated LFTs -Possibly from alcohol abuse.  Patient needs to abstain from alcohol.  Monitor LFTs.  Will possibly need GI workup and follow-up as an outpatient  Acute on chronic thrombocytopenia -Possibly from current illnesses.  Patient possibly has chronic thrombocytopenia from alcohol abuse -Platelets 18 on presentation.  Platelets 6 today.  Transfuse 1 unit of platelets.  Requested hematology evaluation: communicated with Dr. Leonides Schanz via secure chat  Anemia of chronic disease/iron deficiency anemia -Baseline hemoglobin around 9-10.  Hemoglobin 8 this morning.  Also has possible iron deficiency.  Might need iron supplementation at some point.  Alcohol abuse -Last drink was a day prior to presentation as per significant other -Currently on high-dose thiamine.  Continue folic acid and multivitamin.  Continue CIWA protocol. -TOC consult  Severe hypokalemia -Replace.  Repeat a.m. labs.  Severe hypomagnesemia -Improved  Hypophosphatemia -Replace.  DVT prophylaxis: SCDs Code Status: Full Family Communication: None at bedside Disposition Plan: Status is: Inpatient Remains inpatient appropriate because: Of severity of  illness.    Consultants: PCCM/nephrology.  Hematology  Procedures: None  Antimicrobials:  Anti-infectives (From admission, onward)    Start  Dose/Rate Route Frequency Ordered Stop   10/15/22 1000  cefTRIAXone (ROCEPHIN) 1 g in sodium chloride 0.9 % 100 mL IVPB  Status:  Discontinued        1 g 200 mL/hr over 30 Minutes Intravenous Every 24 hours 10/14/22 1746 10/15/22 0018   10/15/22 1000  cefTRIAXone (ROCEPHIN) 2 g in sodium chloride 0.9 % 100 mL IVPB        2 g 200 mL/hr over 30 Minutes Intravenous Every 24 hours 10/15/22 0018     10/14/22 1000  ceFEPIme (MAXIPIME) 2 g in sodium chloride 0.9 % 100 mL IVPB        2 g 200 mL/hr over 30 Minutes Intravenous  Once 10/14/22 0946 10/14/22 1541   10/14/22 0945  aztreonam (AZACTAM) 2 g in sodium chloride 0.9 % 100 mL IVPB  Status:  Discontinued        2 g 200 mL/hr over 30 Minutes Intravenous  Once 10/14/22 0943 10/14/22 0946   10/14/22 0945  metroNIDAZOLE (FLAGYL) IVPB 500 mg        500 mg 100 mL/hr over 60 Minutes Intravenous  Once 10/14/22 0943 10/14/22 1309   10/14/22 0945  vancomycin (VANCOCIN) IVPB 1000 mg/200 mL premix        1,000 mg 200 mL/hr over 60 Minutes Intravenous  Once 10/14/22 1610 10/14/22 1643        Subjective: Patient seen and examined at bedside.  Awake, slow to respond, poor historian.  No seizures, agitation or vomiting reported.  Having intermittent fevers. Objective: Vitals:   10/15/22 0300 10/15/22 0541 10/15/22 0600 10/15/22 0800  BP: 126/72 (!) 141/72 137/64 (!) 142/79  Pulse: (!) 119 (!) 127 (!) 121 (!) 127  Resp: (!) 25 (!) 33 (!) 34 (!) 31  Temp:  99.4 F (37.4 C)  99.1 F (37.3 C)  TempSrc:  Oral  Oral  SpO2: 96% 95% 95% 93%  Weight:  46 kg    Height:        Intake/Output Summary (Last 24 hours) at 10/15/2022 1057 Last data filed at 10/15/2022 0823 Gross per 24 hour  Intake 1996.86 ml  Output 450 ml  Net 1546.86 ml   Filed Weights   10/14/22 0941 10/14/22 1912 10/15/22 0541   Weight: 39.8 kg 45.3 kg 46 kg    Examination:  General exam: Appears calm and comfortable.  Looks chronically ill and deconditioned.  On room air. Respiratory system: Bilateral decreased breath sounds at bases with scattered crackles and intermittent tachypnea Cardiovascular system: S1 & S2 heard, tachycardic  gastrointestinal system: Abdomen is nondistended, soft and nontender. Normal bowel sounds heard. Extremities: No cyanosis, clubbing, edema  Central nervous system: Awake, still slow to respond, poor historian.  Speech is still not that comprehensible.  No focal neurological deficits. Moving extremities Skin: No rashes, lesions or ulcers Psychiatry: Affect is flat.  Not agitated.  Data Reviewed: I have personally reviewed following labs and imaging studies  CBC: Recent Labs  Lab 10/14/22 0909 10/15/22 0540  WBC 17.2* 14.7*  NEUTROABS 15.1*  --   HGB 10.2* 8.0*  HCT 28.4* 21.7*  MCV 95.0 91.6  PLT 18* 6*   Basic Metabolic Panel: Recent Labs  Lab 10/14/22 0909 10/14/22 2134 10/15/22 0540  NA 133* 134* 132*  K 2.0* 2.1* 2.1*  CL 99 100 105  CO2 15* 14* 14*  GLUCOSE 110* 84 73  BUN 64* 63* 65*  CREATININE 5.37* 4.96* 4.43*  CALCIUM 7.7* 7.7* 7.4*  MG  --  0.7* 2.2  PHOS  --   --  1.8*   GFR: Estimated Creatinine Clearance: 9.7 mL/min (A) (by C-G formula based on SCr of 4.43 mg/dL (H)). Liver Function Tests: Recent Labs  Lab 10/14/22 0909 10/15/22 0540  AST 66* 49*  ALT 30 23  ALKPHOS 144* 84  BILITOT 3.9* 4.2*  PROT 6.9 5.1*  ALBUMIN 2.5* 1.8*   No results for input(s): "LIPASE", "AMYLASE" in the last 168 hours. No results for input(s): "AMMONIA" in the last 168 hours. Coagulation Profile: Recent Labs  Lab 10/14/22 0909  INR 1.4*   Cardiac Enzymes: Recent Labs  Lab 10/14/22 2134  CKTOTAL 541*   BNP (last 3 results) No results for input(s): "PROBNP" in the last 8760 hours. HbA1C: No results for input(s): "HGBA1C" in the last 72  hours. CBG: No results for input(s): "GLUCAP" in the last 168 hours. Lipid Profile: No results for input(s): "CHOL", "HDL", "LDLCALC", "TRIG", "CHOLHDL", "LDLDIRECT" in the last 72 hours. Thyroid Function Tests: No results for input(s): "TSH", "T4TOTAL", "FREET4", "T3FREE", "THYROIDAB" in the last 72 hours. Anemia Panel: Recent Labs    10/15/22 0540  VITAMINB12 894  FOLATE 13.5  FERRITIN 590*  TIBC 126*  IRON 6*   Sepsis Labs: Recent Labs  Lab 10/14/22 1005 10/14/22 1739 10/14/22 2134  LATICACIDVEN 4.4* 3.4* 4.2*    Recent Results (from the past 240 hour(s))  Blood Culture (routine x 2)     Status: Abnormal (Preliminary result)   Collection Time: 10/14/22  9:40 AM   Specimen: BLOOD  Result Value Ref Range Status   Specimen Description BLOOD LEFT ANTECUBITAL  Final   Special Requests   Final    BOTTLES DRAWN AEROBIC AND ANAEROBIC Blood Culture results may not be optimal due to an excessive volume of blood received in culture bottles   Culture  Setup Time   Final    GRAM NEGATIVE RODS IN BOTH AEROBIC AND ANAEROBIC BOTTLES CRITICAL RESULT CALLED TO, READ BACK BY AND VERIFIED WITH: PHARMD G. ABBOTT 10/15/22 @ 0011 BY AB    Culture (A)  Final    ESCHERICHIA COLI CULTURE REINCUBATED FOR BETTER GROWTH Performed at [facility], [address], [place], [place] [address]    Report Status PENDING  Incomplete  Blood Culture ID Panel (Reflexed)     Status: Abnormal   Collection Time: 10/14/22  9:40 AM  Result Value Ref Range Status   Enterococcus faecalis NOT DETECTED NOT DETECTED Final   Enterococcus Faecium NOT DETECTED NOT DETECTED Final   Listeria monocytogenes NOT DETECTED NOT DETECTED Final   Staphylococcus species NOT DETECTED NOT DETECTED Final   Staphylococcus aureus (BCID) NOT DETECTED NOT DETECTED Final   Staphylococcus epidermidis NOT DETECTED NOT DETECTED Final   Staphylococcus lugdunensis NOT DETECTED NOT DETECTED Final   Streptococcus species NOT  DETECTED NOT DETECTED Final   Streptococcus agalactiae NOT DETECTED NOT DETECTED Final   Streptococcus pneumoniae NOT DETECTED NOT DETECTED Final   Streptococcus pyogenes NOT DETECTED NOT DETECTED Final   A.calcoaceticus-baumannii NOT DETECTED NOT DETECTED Final   Bacteroides fragilis NOT DETECTED NOT DETECTED Final   Enterobacterales DETECTED (A) NOT DETECTED Final    Comment: Enterobacterales represent a large order of gram negative bacteria, not a single organism. CRITICAL RESULT CALLED TO, READ BACK BY AND VERIFIED WITH: PHARMD G. ABBOTT 10/15/22 @ 0011 BY AB    Enterobacter cloacae complex NOT DETECTED NOT DETECTED Final   Escherichia coli DETECTED (A) NOT DETECTED Final    Comment:  CRITICAL RESULT CALLED TO, READ BACK BY AND VERIFIED WITH: PHARMD G. ABBOTT 10/15/22 @ 0031 BY AB    Klebsiella aerogenes NOT DETECTED NOT DETECTED Final   Klebsiella oxytoca NOT DETECTED NOT DETECTED Final   Klebsiella pneumoniae NOT DETECTED NOT DETECTED Final   Proteus species NOT DETECTED NOT DETECTED Final   Salmonella species NOT DETECTED NOT DETECTED Final   Serratia marcescens NOT DETECTED NOT DETECTED Final   Haemophilus influenzae NOT DETECTED NOT DETECTED Final   Neisseria meningitidis NOT DETECTED NOT DETECTED Final   Pseudomonas aeruginosa NOT DETECTED NOT DETECTED Final   Stenotrophomonas maltophilia NOT DETECTED NOT DETECTED Final   Candida albicans NOT DETECTED NOT DETECTED Final   Candida auris NOT DETECTED NOT DETECTED Final   Candida glabrata NOT DETECTED NOT DETECTED Final   Candida krusei NOT DETECTED NOT DETECTED Final   Candida parapsilosis NOT DETECTED NOT DETECTED Final   Candida tropicalis NOT DETECTED NOT DETECTED Final   Cryptococcus neoformans/gattii NOT DETECTED NOT DETECTED Final   CTX-M ESBL NOT DETECTED NOT DETECTED Final   Carbapenem resistance IMP NOT DETECTED NOT DETECTED Final   Carbapenem resistance KPC NOT DETECTED NOT DETECTED Final   Carbapenem resistance NDM  NOT DETECTED NOT DETECTED Final   Carbapenem resist OXA 48 LIKE NOT DETECTED NOT DETECTED Final   Carbapenem resistance VIM NOT DETECTED NOT DETECTED Final    Comment: Performed at [facility], [address] [place], [place] [address]  SARS Coronavirus 2 by RT PCR (hospital order, performed in [facility] hospital lab) *cepheid single result test* Anterior Nasal Swab     Status: None   Collection Time: 10/14/22  5:26 PM   Specimen: Anterior Nasal Swab  Result Value Ref Range Status   SARS Coronavirus 2 by RT PCR NEGATIVE NEGATIVE Final    Comment: Performed at [facility], [address], [place], [place] [address]  Resp Panel by RT-PCR (Flu A&B, Covid)     Status: None   Collection Time: 10/14/22  5:26 PM  Result Value Ref Range Status   SARS Coronavirus 2 by RT PCR NEGATIVE NEGATIVE Final   Influenza A by PCR NEGATIVE NEGATIVE Final   Influenza B by PCR NEGATIVE NEGATIVE Final    Comment: (NOTE) The Xpert Xpress SARS-CoV-2/FLU/RSV plus assay is intended as an aid in the diagnosis of influenza from Nasopharyngeal swab specimens and should not be used as a sole basis for treatment. Nasal washings and aspirates are unacceptable for Xpert Xpress SARS-CoV-2/FLU/RSV testing.  Fact Sheet for Patients: BloggerCourse.com  Fact Sheet for Healthcare Providers: SeriousBroker.it  This test is not yet approved or cleared by the [place] FDA and has been authorized for detection and/or diagnosis of SARS-CoV-2 by FDA under an Emergency Use Authorization (EUA). This EUA will remain in effect (meaning this test can be used) for the duration of the COVID-19 declaration under Section 564(b)(1) of the Act, 21 U.S.C. section 360bbb-3(b)(1), unless the authorization is terminated or revoked.  Performed at [facility], [address] [place], [place] [address]   MRSA Next Gen by PCR, Nasal     Status: None   Collection  Time: 10/14/22  8:27 PM   Specimen: Nasal Mucosa; Nasal Swab  Result Value Ref Range Status   MRSA by PCR Next Gen NOT DETECTED NOT DETECTED Final    Comment: (NOTE) The GeneXpert MRSA Assay (FDA approved for NASAL specimens only), is one component of a comprehensive MRSA colonization surveillance program. It  is not intended to diagnose MRSA infection nor to guide or monitor treatment for MRSA infections. Test performance is not FDA approved in patients less than 54 years old. Performed at [facility], [address], [place], [place] [address]   Blood Culture (routine x 2)     Status: None (Preliminary result)   Collection Time: 10/14/22  9:34 PM   Specimen: BLOOD  Result Value Ref Range Status   Specimen Description BLOOD BLOOD LEFT HAND  Final   Special Requests   Final    BOTTLES DRAWN AEROBIC AND ANAEROBIC Blood Culture adequate volume   Culture   Final    NO GROWTH < 12 HOURS Performed at [facility], [address], [place], [place] [address]    Report Status PENDING  Incomplete         Radiology Studies: CT ABDOMEN PELVIS WO CONTRAST  Result Date: 10/14/2022 CLINICAL DATA:  Sepsis weakness diarrhea EXAM: CT ABDOMEN AND PELVIS WITHOUT CONTRAST TECHNIQUE: Multidetector CT imaging of the abdomen and pelvis was performed following the standard protocol without IV contrast. RADIATION DOSE REDUCTION: This exam was performed according to the departmental dose-optimization program which includes automated exposure control, adjustment of the mA and/or kV according to patient size and/or use of iterative reconstruction technique. COMPARISON:  CT 06/30/2022, 10/12/2020 FINDINGS: Lower chest: Lung bases demonstrate dependent atelectasis. Hepatobiliary: Hepatic steatosis. Suspicion of subtle surface nodularity of the liver as may be seen with cirrhosis. No calcified gallstone or biliary dilatation Pancreas: Atrophic pancreas without acute inflammation. Scattered  calcifications at the proximal pancreas probably due to chronic pancreatitis. Spleen: Normal in size without focal abnormality. Adrenals/Urinary Tract: Negative for adrenal nodule. Interval finding of considerable bilateral perinephric stranding and small volume perinephric fluid. No hydronephrosis. Small nonobstructing stones lower pole left kidney. Urinary bladder is decompressed by Foley catheter Stomach/Bowel: Stomach nonenlarged. Fluid-filled borderline distended pelvic small bowel loops. Moderate air and fluid distension of the colon and rectum. 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 Curved appearance of the bowel in the left lower quadrant, series 3, image 53 but without convincing obstruction related to this finding. Vascular/Lymphatic: Advanced aortic atherosclerosis. No aneurysm. No suspicious lymph nodes. Reproductive: Uterus and adnexa are unremarkable Other: Negative for pelvic effusion or free air Musculoskeletal: No acute or suspicious osseous abnormality. IMPRESSION: 1. Interval finding of considerable bilateral perinephric stranding and small volume perinephric fluid. No hydronephrosis. Findings are nonspecific and could be related to pyelonephritis or other medical renal disease. 2. Moderate air and fluid distension of the colon and rectum consistent with diarrheal illness. 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 3. Hepatic steatosis. Suspicion of subtle surface nodularity of the liver as may be seen with cirrhosis. 4. Aortic atherosclerosis. Aortic Atherosclerosis (ICD10-I70.0). Electronically Signed   By: Jasmine Pang M.D.   On: 10/14/2022 21:23   CT HEAD WO CONTRAST ( )  Result Date: 10/14/2022 CLINICAL DATA:  Generalized weakness dysarthria started 4 days ago according to epic notes unable to walk EXAM: CT HEAD WITHOUT CONTRAST TECHNIQUE: Contiguous axial images were obtained from the base of the skull through the vertex without intravenous contrast.  RADIATION DOSE REDUCTION: This exam was performed according to the departmental dose-optimization program which includes automated exposure control, adjustment of the mA and/or kV according to patient size and/or use of iterative reconstruction technique. COMPARISON:  CT brain 07/25/2021 FINDINGS: Brain: No acute territorial infarction, hemorrhage or intracranial mass. Chronic left parietal infarct. Mild atrophy. Moderate white matter hypodensity  consistent with chronic small vessel ischemic change. The ventricles are nonenlarged Vascular: No hyperdense vessels.  Carotid vascular calcification Skull: Normal. Negative for fracture or focal lesion. Sinuses/Orbits: No acute finding. Other: None IMPRESSION: 1. No definite CT evidence for acute intracranial abnormality. 2. Atrophy and chronic small vessel ischemic changes of the white matter. Chronic left parietal infarct. Electronically Signed   By: Jasmine Pang M.D.   On: 10/14/2022 21:11   US Abdomen Limited RUQ (LIVER/GB)  Result Date: 10/14/2022 CLINICAL DATA:  Elevated liver enzymes EXAM: ULTRASOUND ABDOMEN LIMITED RIGHT UPPER QUADRANT COMPARISON:  None Available. FINDINGS: Gallbladder: No gallstones or wall thickening visualized. No sonographic Murphy sign noted by sonographer. Common bile duct: Diameter: 3.3 mm Liver: Coarsened slightly echogenic. No focal hepatic abnormality. Portal vein is patent on color Doppler imaging with normal direction of blood flow towards the liver. Other: None. IMPRESSION: Coarsened slightly echogenic liver consistent with hepatic steatosis and or hepatocellular disease. Negative for gallstones Electronically Signed   By: Jasmine Pang M.D.   On: 10/14/2022 20:58   DG Chest Port 1 View  Result Date: 10/14/2022 CLINICAL DATA:  Possible sepsis. Generalized body ache. Feeling sick over the last 3-4 days. Mental status changes and confusion. Sinus tachycardia. EXAM: PORTABLE CHEST 1 VIEW COMPARISON:  02/05/2022 FINDINGS: Heart size  is normal. Mediastinal shadows are normal. Artifact overlies the chest. The patient has taken a poor inspiration. Allowing for that, the lungs are probably clear. No visible pleural fluid. No abnormal bone finding. IMPRESSION: Poor inspiration. Allowing for that, no active disease suspected. Electronically Signed   By: Paulina Fusi M.D.   On: 10/14/2022 11:26   [place] EKG SITE RITE  Result Date: 10/14/2022 If Site Rite image not attached, placement could not be confirmed due to current cardiac rhythm.       Scheduled Meds:  Chlorhexidine Gluconate Cloth  6 each Topical Q0600   folic acid  1 mg Oral Daily   multivitamin with minerals  1 tablet Oral Daily   potassium chloride  40 mEq Oral Q4H   [START ON 10/22/2022] thiamine  100 mg Oral Daily   Continuous Infusions:  cefTRIAXone (ROCEPHIN)  IV 2 g (10/15/22 0930)   potassium chloride 10 mEq (10/15/22 1035)   potassium PHOSPHATE IVPB (in mmol) 15 mmol (10/15/22 1041)   sodium bicarbonate 150 mEq in dextrose 5 % 1,150 mL infusion 125 mL/hr at 10/15/22 0803   thiamine (VITAMIN B1) injection 500 mg (10/15/22 0629)   Followed by   Melene Muller ON 10/17/2022] thiamine (VITAMIN B1) injection            Glade Lloyd, MD [facility] 10/15/2022, 10:57 AM

## 2022-10-15 NOTE — Evaluation (Addendum)
I have reviewed and concur with this student's documentation.   Angela Nevin, MA, CCC-SLP Speech Therapy  10/15/2022 10:38 AM    Clinical/Bedside Swallow Evaluation Patient Details  Name: Brandy Bishop MRN: [id number] Date of Birth: Sep 14, 1960  Today's Date: 10/15/2022 Time: SLP Start Time (ACUTE ONLY): 0930 SLP Stop Time (ACUTE ONLY): 0950 SLP Time Calculation (min) (ACUTE ONLY): 20 min  Past Medical History:  Past Medical History:  Diagnosis Date   Alcoholism (HCC) 2011   Asthma    Chronic lower back pain    Colitis 04/2014.   Bloody diarrhea   Daily headache    GIB (gastrointestinal bleeding)    Hypertension    Pancreatitis    Chronic pancreatitis noted on CT scan in 04/2014.   Polysubstance abuse (HCC) 2011   Pyelonephritis 05/2012   Seizures (HCC)    last seizure was 04/2019 per patient    Past Surgical History:  Past Surgical History:  Procedure Laterality Date   COLONOSCOPY WITH PROPOFOL N/A 09/09/2019   Procedure: COLONOSCOPY WITH PROPOFOL;  Surgeon: Charlott Rakes, MD;  Location: [facility] ENDOSCOPY;  Service: Endoscopy;  Laterality: N/A;   ESOPHAGOGASTRODUODENOSCOPY N/A 05/31/2015   Procedure: ESOPHAGOGASTRODUODENOSCOPY (EGD);  Surgeon: Hilarie Fredrickson, MD;  Location: [facility] ENDOSCOPY;  Service: Endoscopy;  Laterality: N/A;   ESOPHAGOGASTRODUODENOSCOPY N/A 09/09/2019   Procedure: ESOPHAGOGASTRODUODENOSCOPY (EGD);  Surgeon: Charlott Rakes, MD;  Location: [facility] ENDOSCOPY;  Service: Endoscopy;  Laterality: N/A;   ORIF HUMERUS FRACTURE Right 05/12/2019   Procedure: OPEN REDUCTION INTERNAL FIXATION (ORIF) DISTAL HUMERUS FRACTURE;  Surgeon: Bjorn Pippin, MD;  Location: [facility] ORS;  Service: Orthopedics;  Laterality: Right;   HPI:  Pt is a 62 y.o. female who presented with generalized aches and malaise x3-4 days. She noted nausea, emesis, and diarreha, but her significant other denied noting these symptoms. Increasing confusion noted from family. PMH is significant for etoh  abuse, panreatitis, seizures, hypertension, and GIB.    Assessment / Plan / Recommendation  Clinical Impression  Pt was seen by SLP for bedside swallow evaluation after failing the yale swallow screener. Pt exhibited signs of confusion/cognitive impairment throughout the evaluation, but was orientated x3. Speech was dysarthric and about 60% intelligible. Episode of emesis before administration of liquids. Vomit was frothy saliva with traces of red. No further emesis observed after the initial episode. Thin liquids (water and Ensure) were administered via cup. Pt noted that she does not typically eat solid foods and prefers drinking Ensure. Initially, no s/sx of dysphagia noted. Delayed coughing episode followed larger volume of Ensure consumption. Suspected esophageal component to dysphagia given h/o etoh abuse. SLP reccommending full liquid diet until modified barium swallow study can be completed to further investigate swallow functionality. SLP Visit Diagnosis: Dysphagia, unspecified (R13.10)    Aspiration Risk  Mild aspiration risk    Diet Recommendation Thin liquid    Liquid Administration via: Cup;Straw Medication Administration: Crushed with puree Supervision: Staff to assist with self feeding Compensations: Slow rate;Small sips/bites Postural Changes: Seated upright at 90 degrees    Other  Recommendations Oral Care Recommendations: Oral care BID    Recommendations for follow up therapy are one component of a multi-disciplinary discharge planning process, led by the attending physician.  Recommendations may be updated based on patient status, additional functional criteria and insurance authorization.  Follow up Recommendations Other (comment) (TBD)      Assistance Recommended at Discharge    Functional Status Assessment Patient has had a recent decline in their functional status and demonstrates  the ability to make significant improvements in function in a reasonable and  predictable amount of time.  Frequency and Duration            Prognosis Prognosis for improved oropharyngeal function: Good      Swallow Study   General Date of Onset: 10/14/22 HPI: Pt is a 62 y.o. female who presented with generalized aches and malaise x3-4 days. She noted nausea, emesis, and diarreha, but her significant other denied noting these symptoms. Increasing confusion noted from family. PMH is significant for etoh abuse, panreatitis, seizures, hypertension, and GIB. Type of Study: Bedside Swallow Evaluation Previous Swallow Assessment: n/a Diet Prior to this Study: NPO Temperature Spikes Noted: Yes (100 degrees at 0100 on 09/10) Respiratory Status: Room air History of Recent Intubation: No Behavior/Cognition: Alert;Cooperative Oral Cavity Assessment: Within Functional Limits Oral Care Completed by SLP: No Oral Cavity - Dentition: Adequate natural dentition Vision: Functional for self-feeding Self-Feeding Abilities: Needs assist;Able to feed self Patient Positioning: Upright in bed Baseline Vocal Quality: Normal Volitional Swallow: Able to elicit    Oral/Motor/Sensory Function Overall Oral Motor/Sensory Function: Within functional limits   Ice Chips Ice chips: Not tested   Thin Liquid Thin Liquid: Impaired Presentation: Cup;Self Fed Pharyngeal  Phase Impairments: Cough - Delayed    Nectar Thick Nectar Thick Liquid: Not tested   Honey Thick Honey Thick Liquid: Not tested   Puree Puree: Not tested   Solid     Solid: Not tested      Marline Backbone, B.S., Speech Therapy Student

## 2022-10-15 NOTE — Consult Note (Signed)
[facility] Renal Consultation Note  Requesting MD: Glade Lloyd, MD Indication for Consultation: AKI  HPI:  Brandy Bishop is a 62 y.o. female with a past medical history of seizure disorder, hepatic steatosis, chronic pancreatitis, chronic thrombocytopenia, polysubstance use disorder, presenting to the ED with generalized weakness and found to have E coli bacteremia from a likely urinary source. Nephrology service consulted for AKI  Patient has a history of dysarthria at baseline that complicates medical interview. Reviewed H&P and consultant notes to gather relevant medical information.  Patient initially presented to the hospital with severe malaise and weakness. Prior to admission, she reports aa 5-6 day history of liquid bowel movements (~4-5/day). During this time, she also endorsed suprapubic tenderness and severe pain when peeing. Given nausea, abdominal pain, and diarrhea, patient had not had significant PO intake for about 4 days. She was afraid to eat. She does not remember the last time she peed prior to admission. She also reported bilateral lower back pain that radiates to the front, though she couldn't distinguish this from the abdominal pain and urgency prior to having BM. She denied sick contacts, instrumentation, or new foods to her. She denies chronic history of liquid bowel movements.   She also reports that she has been compliant with her prescribed medications. She is unable to named them, but received mailed orders from [employer]. She is particularly consistent in taking her blood pressure medications and her depression medication. Dispense review support this. Her medications include amlodipine 10 mg, Losartan hydrochlorothiazide 50 -12.5 mg daily, and mirtazapine 7.5 mg daily. The last time she took her medications was the day prior to admission.  [name] also reports a history of alcohol use disorder. Her drink of choice is beer, of which she  drinks 1-3 beers daily. She was previously on medications to help her cravings but stopped taking them a while back. Last drink was day prior to admission.   In the ED, patient was mildly hypotensive, febrile to 103.34F, found to have a new AKI (Cr 5.37), hyponatremia, severe hypokalemia (2.0), AGMA with lactate elevation. Acute on chronic anemia, severely thrombocytopenia (18->6), and neutrophil predominant leukocytosis with L shift ( 17.2). Blood cultures positive for E coli. Reviewed U/A with pyuria and bacteriuria, CT abdomen and pelvis with perinephric stranding without evidence of obstruction. She was initially broadly covered with Vancomycin, Cefepime, and Flagyl now transitioned to Ceftriaxone. No longer febrile, however, she has been consistently tachycardic. She is now s/p 2L IVF with LR and recently started on 129ml/HR of sodium bicarbonate.    Creatinine, Ser  Date/Time Value Ref Range Status  10/15/2022 05:40 AM 4.43 (H) 0.44 - 1.00 mg/dL Final  30/86/5784 69:62 PM 4.96 (H) 0.44 - 1.00 mg/dL Final  95/28/4132 44:01 AM 5.37 (H) 0.44 - 1.00 mg/dL Final  02/72/5366 44:03 AM 0.55 0.44 - 1.00 mg/dL Final  47/42/5956 38:75 AM 0.62 0.44 - 1.00 mg/dL Final  64/33/2951 88:41 PM 0.83 0.44 - 1.00 mg/dL Final  66/07/3014 01:09 PM 0.47 0.44 - 1.00 mg/dL Final  32/35/5732 20:25 PM 0.39 (L) 0.44 - 1.00 mg/dL Final  42/70/6237 62:83 PM 0.46 0.44 - 1.00 mg/dL Final  15/17/6160 73:71 PM 0.69 0.44 - 1.00 mg/dL Final  07/31/9483 46:27 PM 0.61 0.40 - 1.20 mg/dL Final  03/50/0938 18:29 AM 0.52 0.44 - 1.00 mg/dL Final  93/71/6967 89:38 AM 0.54 0.44 - 1.00 mg/dL Final  11/20/5100 58:52 AM 0.58 0.44 - 1.00 mg/dL Final  77/82/4235 36:14 AM 0.80 0.44 -  1.00 mg/dL Final  40/98/1191 47:82 PM 0.54 0.44 - 1.00 mg/dL Final  95/62/1308 65:78 AM 0.42 (L) 0.44 - 1.00 mg/dL Final  46/96/2952 84:13 AM 0.49 0.44 - 1.00 mg/dL Final  24/40/1027 25:36 AM 0.56 0.44 - 1.00 mg/dL Final  64/40/3474 25:95 AM 0.47 0.44 -  1.00 mg/dL Final  63/87/5643 32:95 PM 0.64 0.44 - 1.00 mg/dL Final  18/84/1660 63:01 PM 0.51 0.44 - 1.00 mg/dL Final  60/11/9321 55:73 PM 0.61 0.44 - 1.00 mg/dL Final  22/03/5425 06:23 PM 0.51 0.44 - 1.00 mg/dL Final  76/28/3151 76:16 PM 1.03 (H) 0.44 - 1.00 mg/dL Final  07/37/1062 69:48 PM 0.49 0.44 - 1.00 mg/dL Final  54/62/7035 00:93 AM 0.54 0.44 - 1.00 mg/dL Final  81/82/9937 16:96 AM 0.65 0.44 - 1.00 mg/dL Final  78/93/8101 75:10 PM 0.60 0.44 - 1.00 mg/dL Final  25/85/2778 24:23 PM 0.62 0.44 - 1.00 mg/dL Final  53/61/4431 54:00 PM 0.49 0.44 - 1.00 mg/dL Final  86/76/1950 93:26 AM 0.53 0.50 - 1.10 mg/dL Final    Comment:    DELTA CHECK NOTED  04/15/2014 03:55 AM 0.83 0.50 - 1.10 mg/dL Final  71/24/5809 98:33 PM 0.68 0.50 - 1.10 mg/dL Final  82/50/5397 67:34 PM 0.70 0.50 - 1.10 mg/dL Final  19/37/9024 09:73 PM 0.70 0.50 - 1.10 mg/dL Final  53/29/9242 68:34 PM 0.61 0.50 - 1.10 mg/dL Final  19/62/2297 98:92 AM 0.74 0.50 - 1.10 mg/dL Final  11/94/1740 81:44 PM 0.65 0.57 - 1.00 mg/dL Final  81/85/6314 97:02 AM 0.72 0.50 - 1.10 mg/dL Final  63/78/5885 02:77 AM 0.70 0.50 - 1.10 mg/dL Final  41/28/7867 67:20 AM 0.68 0.50 - 1.10 mg/dL Final  94/70/9628 36:62 AM 0.73 0.50 - 1.10 mg/dL Final  94/76/5465 03:54 AM 0.82 0.50 - 1.10 mg/dL Final  65/68/1275 17:00 PM 0.83 0.50 - 1.10 mg/dL Final  17/49/4496 75:91 AM 0.66 0.50 - 1.10 mg/dL Final  63/84/6659 93:57 AM 0.72 0.50 - 1.10 mg/dL Final  01/77/9390 30:09 PM 0.47 (L) 0.50 - 1.10 mg/dL Final    Comment:    **Please note change in reference range.**  12/01/2009 05:24 AM 0.71 0.4 - 1.2 mg/dL Final  23/30/0762 26:33 AM 0.61 REPEATED TO VERIFY DELTA CHECK NOTED 0.4 - 1.2 mg/dL Final  35/45/6256 38:93 PM 1.1 0.4 - 1.2 mg/dL Final  73/42/8768 11:57 PM 0.64 0.4 - 1.2 mg/dL Final     PMHx:   Past Medical History:  Diagnosis Date   Alcoholism (HCC) 2011   Asthma    Chronic lower back pain    Colitis 04/2014.   Bloody diarrhea   Daily  headache    GIB (gastrointestinal bleeding)    Hypertension    Pancreatitis    Chronic pancreatitis noted on CT scan in 04/2014.   Polysubstance abuse (HCC) 2011   Pyelonephritis 05/2012   Seizures (HCC)    last seizure was 04/2019 per patient     Past Surgical History:  Procedure Laterality Date   COLONOSCOPY WITH PROPOFOL N/A 09/09/2019   Procedure: COLONOSCOPY WITH PROPOFOL;  Surgeon: Charlott Rakes, MD;  Location: [facility] ENDOSCOPY;  Service: Endoscopy;  Laterality: N/A;   ESOPHAGOGASTRODUODENOSCOPY N/A 05/31/2015   Procedure: ESOPHAGOGASTRODUODENOSCOPY (EGD);  Surgeon: Hilarie Fredrickson, MD;  Location: [facility] ENDOSCOPY;  Service: Endoscopy;  Laterality: N/A;   ESOPHAGOGASTRODUODENOSCOPY N/A 09/09/2019   Procedure: ESOPHAGOGASTRODUODENOSCOPY (EGD);  Surgeon: Charlott Rakes, MD;  Location: [facility] ENDOSCOPY;  Service: Endoscopy;  Laterality: N/A;   ORIF HUMERUS FRACTURE Right 05/12/2019   Procedure: OPEN REDUCTION INTERNAL FIXATION (  ORIF) DISTAL HUMERUS FRACTURE;  Surgeon: Bjorn Pippin, MD;  Location: [facility] ORS;  Service: Orthopedics;  Laterality: Right;    Family Hx:  Family History  Problem Relation Age of Onset   Cancer Mother    Cancer Brother     Social History:  reports that she has never smoked. She has never used smokeless tobacco. She reports that she does not currently use alcohol after a past usage of about 10.0 standard drinks of alcohol per week. She reports that she does not use drugs.  Allergies:  Allergies  Allergen Reactions   Penicillins Swelling    .Has patient had a PCN reaction causing immediate rash, facial/tongue/throat swelling, SOB or lightheadedness with hypotension: Yes Has patient had a PCN reaction causing severe rash involving mucus membranes or skin necrosis: No Has patient had a PCN reaction that required hospitalization No Has patient had a PCN reaction occurring within the last 10 years: No If all of the above answers are "NO", then may proceed with  Cephalosporin use.     Medications: Prior to Admission medications   Medication Sig Start Date End Date Taking? Authorizing Provider  amLODipine (NORVASC) 10 MG tablet Take 10 mg by mouth daily. 08/13/19  Yes [provider]  cetirizine (ZYRTEC) 10 MG tablet Take 10 mg by mouth daily. 12/28/19  Yes [provider]  ferrous sulfate 324 MG TBEC Take 324 mg by mouth daily with breakfast.   Yes [provider]  losartan-hydrochlorothiazide (HYZAAR) 50-12.5 MG tablet Take 1 tablet by mouth daily.   Yes [provider]  mirtazapine (REMERON) 7.5 MG tablet Take 7.5 mg by mouth at bedtime. 04/04/22 10/14/22 Yes [provider]  omeprazole (PRILOSEC) 20 MG capsule Take 1 capsule (20 mg total) by mouth daily. 10/12/20 10/14/22 Yes Linwood Dibbles, MD  ondansetron (ZOFRAN-ODT) 8 MG disintegrating tablet Take 8 mg by mouth 2 (two) times daily as needed for vomiting or nausea. 03/19/22  Yes [provider]  thiamine 100 MG tablet Take 1 tablet (100 mg total) by mouth daily. 09/10/19  Yes Lynn Ito, MD    I have reviewed the patient's current medications. Prior to Admission:  Medications Prior to Admission  Medication Sig Dispense Refill Last Dose   amLODipine (NORVASC) 10 MG tablet Take 10 mg by mouth daily.   Past Week   cetirizine (ZYRTEC) 10 MG tablet Take 10 mg by mouth daily.   Past Week   ferrous sulfate 324 MG TBEC Take 324 mg by mouth daily with breakfast.   Past Week   losartan-hydrochlorothiazide (HYZAAR) 50-12.5 MG tablet Take 1 tablet by mouth daily.   Past Week   mirtazapine (REMERON) 7.5 MG tablet Take 7.5 mg by mouth at bedtime.   Past Week   omeprazole (PRILOSEC) 20 MG capsule Take 1 capsule (20 mg total) by mouth daily. 30 capsule 0 Past Week   ondansetron (ZOFRAN-ODT) 8 MG disintegrating tablet Take 8 mg by mouth 2 (two) times daily as needed for vomiting or nausea.   Past Week   thiamine 100 MG tablet Take 1 tablet (100 mg total) by mouth  daily. 30 tablet 0 Past Week   Scheduled:  sodium chloride   Intravenous Once   Chlorhexidine Gluconate Cloth  6 each Topical Q0600   folic acid  1 mg Oral Daily   multivitamin with minerals  1 tablet Oral Daily   potassium chloride  40 mEq Oral Q4H   [START ON 10/22/2022] thiamine  100 mg Oral Daily  Continuous:  cefTRIAXone (ROCEPHIN)  IV 2 g (10/15/22 0930)   potassium chloride 10 mEq (10/15/22 1250)   potassium PHOSPHATE IVPB (in mmol) 15 mmol (10/15/22 1041)   sodium bicarbonate 150 mEq in dextrose 5 % 1,150 mL infusion 125 mL/hr at 10/15/22 0803   thiamine (VITAMIN B1) injection 500 mg (10/15/22 0629)   Followed by   Melene Muller ON 10/17/2022] thiamine (VITAMIN B1) injection      Labs:  Results for orders placed or performed during the hospital encounter of 10/14/22 (from the past 48 hour(s))  Comprehensive metabolic panel     Status: Abnormal   Collection Time: 10/14/22  9:09 AM  Result Value Ref Range   Sodium 133 (L) 135 - 145 mmol/L   Potassium 2.0 (LL) 3.5 - 5.1 mmol/L    Comment: CRITICAL RESULT CALLED TO, READ BACK BY AND VERIFIED WITH: CASSAUNDRA KHOURI,RN AT 1134 10/14/2022 BY ZBEECH.    Chloride 99 98 - 111 mmol/L   CO2 15 (L) 22 - 32 mmol/L   Glucose, Bld 110 (H) 70 - 99 mg/dL    Comment: Glucose reference range applies only to samples taken after fasting for at least 8 hours.   BUN 64 (H) 8 - 23 mg/dL   Creatinine, Ser 1.61 (H) 0.44 - 1.00 mg/dL   Calcium 7.7 (L) 8.9 - 10.3 mg/dL   Total Protein 6.9 6.5 - 8.1 g/dL   Albumin 2.5 (L) 3.5 - 5.0 g/dL   AST 66 (H) 15 - 41 U/L   ALT 30 0 - 44 U/L   Alkaline Phosphatase 144 (H) 38 - 126 U/L   Total Bilirubin 3.9 (H) 0.3 - 1.2 mg/dL   GFR, Estimated 9 (L) >60 mL/min    Comment: (NOTE) Calculated using the CKD-EPI Creatinine Equation (2021)    Anion gap 19 (H) 5 - 15    Comment: ELECTROLYTES REPEATED TO VERIFY Performed at [facility], [address], [place], [place] [address]   CBC with Differential      Status: Abnormal   Collection Time: 10/14/22  9:09 AM  Result Value Ref Range   WBC 17.2 (H) 4.0 - 10.5 K/uL   RBC 2.99 (L) 3.87 - 5.11 MIL/uL   Hemoglobin 10.2 (L) 12.0 - 15.0 g/dL   HCT 54.0 (L) 98.1 - 19.1 %   MCV 95.0 80.0 - 100.0 fL   MCH 34.1 (H) 26.0 - 34.0 pg   MCHC 35.9 30.0 - 36.0 g/dL   RDW 47.8 29.5 - 62.1 %   Platelets 18 (LL) 150 - 400 K/uL    Comment: Immature Platelet Fraction may be clinically indicated, consider ordering this additional test [id number] REPEATED TO VERIFY THIS CRITICAL RESULT HAS VERIFIED AND BEEN CALLED TO KHOURI,C RN BY AMANDA LEONARD ON 09 09 2024 AT 1037, AND HAS BEEN READ BACK.     nRBC 0.0 0.0 - 0.2 %   Neutrophils Relative % 88 %   Neutro Abs 15.1 (H) 1.7 - 7.7 K/uL   Lymphocytes Relative 4 %   Lymphs Abs 0.8 0.7 - 4.0 K/uL   Monocytes Relative 5 %   Monocytes Absolute 0.9 0.1 - 1.0 K/uL   Eosinophils Relative 0 %   Eosinophils Absolute 0.0 0.0 - 0.5 K/uL   Basophils Relative 0 %   Basophils Absolute 0.0 0.0 - 0.1 K/uL   WBC Morphology Mild Left Shift (1-5% metas, occ myelo)    RBC Morphology MORPHOLOGY UNREMARKABLE    Immature Granulocytes 3 %   Abs  Immature Granulocytes 0.45 (H) 0.00 - 0.07 K/uL    Comment: Performed at [facility], [address] [place], [place] [address]  Protime-INR     Status: Abnormal   Collection Time: 10/14/22  9:09 AM  Result Value Ref Range   Prothrombin Time 17.5 (H) 11.4 - 15.2 seconds   INR 1.4 (H) 0.8 - 1.2    Comment: (NOTE) INR goal varies based on device and disease states. Performed at [facility], [address] [place], [place] [address]   APTT     Status: Abnormal   Collection Time: 10/14/22  9:09 AM  Result Value Ref Range   aPTT 42 (H) 24 - 36 seconds    Comment:        IF BASELINE aPTT IS ELEVATED, SUGGEST PATIENT RISK ASSESSMENT BE USED TO DETERMINE APPROPRIATE ANTICOAGULANT THERAPY. Performed at [facility], [address], [place], [place] [address]    Ethanol     Status: None   Collection Time: 10/14/22  9:25 AM  Result Value Ref Range   Alcohol, Ethyl (B) <10 <10 mg/dL    Comment: (NOTE) Lowest detectable limit for serum alcohol is 10 mg/dL.  For medical purposes only. Performed at [facility], [address] [place], [place] [address]   Blood Culture (routine x 2)     Status: Abnormal (Preliminary result)   Collection Time: 10/14/22  9:40 AM   Specimen: BLOOD  Result Value Ref Range   Specimen Description BLOOD LEFT ANTECUBITAL    Special Requests      BOTTLES DRAWN AEROBIC AND ANAEROBIC Blood Culture results may not be optimal due to an excessive volume of blood received in culture bottles   Culture  Setup Time      GRAM NEGATIVE RODS IN BOTH AEROBIC AND ANAEROBIC BOTTLES CRITICAL RESULT CALLED TO, READ BACK BY AND VERIFIED WITH: PHARMD G. ABBOTT 10/15/22 @ 0011 BY AB    Culture (A)     ESCHERICHIA COLI CULTURE REINCUBATED FOR BETTER GROWTH Performed at [facility], [address], [place], [place] [address]    Report Status PENDING   Blood Culture ID Panel (Reflexed)     Status: Abnormal   Collection Time: 10/14/22  9:40 AM  Result Value Ref Range   Enterococcus faecalis NOT DETECTED NOT DETECTED   Enterococcus Faecium NOT DETECTED NOT DETECTED   Listeria monocytogenes NOT DETECTED NOT DETECTED   Staphylococcus species NOT DETECTED NOT DETECTED   Staphylococcus aureus (BCID) NOT DETECTED NOT DETECTED   Staphylococcus epidermidis NOT DETECTED NOT DETECTED   Staphylococcus lugdunensis NOT DETECTED NOT DETECTED   Streptococcus species NOT DETECTED NOT DETECTED   Streptococcus agalactiae NOT DETECTED NOT DETECTED   Streptococcus pneumoniae NOT DETECTED NOT DETECTED   Streptococcus pyogenes NOT DETECTED NOT DETECTED   A.calcoaceticus-baumannii NOT DETECTED NOT DETECTED   Bacteroides fragilis NOT DETECTED NOT DETECTED   Enterobacterales DETECTED (A) NOT DETECTED    Comment: Enterobacterales represent a  large order of gram negative bacteria, not a single organism. CRITICAL RESULT CALLED TO, READ BACK BY AND VERIFIED WITH: PHARMD G. ABBOTT 10/15/22 @ 0011 BY AB    Enterobacter cloacae complex NOT DETECTED NOT DETECTED   Escherichia coli DETECTED (A) NOT DETECTED    Comment: CRITICAL RESULT CALLED TO, READ BACK BY AND VERIFIED WITH: PHARMD G. ABBOTT 10/15/22 @ 0031 BY AB    Klebsiella aerogenes NOT DETECTED NOT DETECTED   Klebsiella oxytoca NOT DETECTED NOT DETECTED  Klebsiella pneumoniae NOT DETECTED NOT DETECTED   Proteus species NOT DETECTED NOT DETECTED   Salmonella species NOT DETECTED NOT DETECTED   Serratia marcescens NOT DETECTED NOT DETECTED   Haemophilus influenzae NOT DETECTED NOT DETECTED   Neisseria meningitidis NOT DETECTED NOT DETECTED   Pseudomonas aeruginosa NOT DETECTED NOT DETECTED   Stenotrophomonas maltophilia NOT DETECTED NOT DETECTED   Candida albicans NOT DETECTED NOT DETECTED   Candida auris NOT DETECTED NOT DETECTED   Candida glabrata NOT DETECTED NOT DETECTED   Candida krusei NOT DETECTED NOT DETECTED   Candida parapsilosis NOT DETECTED NOT DETECTED   Candida tropicalis NOT DETECTED NOT DETECTED   Cryptococcus neoformans/gattii NOT DETECTED NOT DETECTED   CTX-M ESBL NOT DETECTED NOT DETECTED   Carbapenem resistance IMP NOT DETECTED NOT DETECTED   Carbapenem resistance KPC NOT DETECTED NOT DETECTED   Carbapenem resistance NDM NOT DETECTED NOT DETECTED   Carbapenem resist OXA 48 LIKE NOT DETECTED NOT DETECTED   Carbapenem resistance VIM NOT DETECTED NOT DETECTED    Comment: Performed at [facility], [address] [place], [place] [address]  I-Stat Lactic Acid, ED     Status: Abnormal   Collection Time: 10/14/22 10:05 AM  Result Value Ref Range   Lactic Acid, Venous 4.4 (HH) 0.5 - 1.9 mmol/L   Comment NOTIFIED PHYSICIAN   Urinalysis, w/ Reflex to Culture (Infection Suspected) -Urine, Clean Catch     Status: Abnormal   Collection Time: 10/14/22   4:00 PM  Result Value Ref Range   Specimen Source URINE, CLEAN CATCH    Color, Urine AMBER (A) YELLOW    Comment: BIOCHEMICALS MAY BE AFFECTED BY COLOR   APPearance TURBID (A) CLEAR   Specific Gravity, Urine 1.017 1.005 - 1.030   pH 5.0 5.0 - 8.0   Glucose, UA NEGATIVE NEGATIVE mg/dL   Hgb urine dipstick SMALL (A) NEGATIVE   Bilirubin Urine SMALL (A) NEGATIVE   Ketones, ur NEGATIVE NEGATIVE mg/dL   Protein, ur 478 (A) NEGATIVE mg/dL   Nitrite NEGATIVE NEGATIVE   Leukocytes,Ua MODERATE (A) NEGATIVE   RBC / HPF 0-5 0 - 5 RBC/hpf   WBC, UA >50 0 - 5 WBC/hpf    Comment:        Reflex urine culture not performed if WBC <=10, OR if Squamous epithelial cells >5. If Squamous epithelial cells >5 suggest recollection.    Bacteria, UA MANY (A) NONE SEEN   Squamous Epithelial / HPF 6-10 0 - 5 /HPF   WBC Clumps PRESENT    Mucus PRESENT    Hyaline Casts, UA PRESENT     Comment: Performed at [facility], [address], [place], [place] [address]  SARS Coronavirus 2 by RT PCR (hospital order, performed in [facility] hospital lab) *cepheid single result test* Anterior Nasal Swab     Status: None   Collection Time: 10/14/22  5:26 PM   Specimen: Anterior Nasal Swab  Result Value Ref Range   SARS Coronavirus 2 by RT PCR NEGATIVE NEGATIVE    Comment: Performed at [facility], [address] [place], [place] [address]  Resp Panel by RT-PCR (Flu A&B, Covid)     Status: None   Collection Time: 10/14/22  5:26 PM  Result Value Ref Range   SARS Coronavirus 2 by RT PCR NEGATIVE NEGATIVE   Influenza A by PCR NEGATIVE NEGATIVE   Influenza B by PCR NEGATIVE NEGATIVE    Comment: (NOTE) The Xpert Xpress SARS-CoV-2/FLU/RSV plus assay  is intended as an aid in the diagnosis of influenza from Nasopharyngeal swab specimens and should not be used as a sole basis for treatment. Nasal washings and aspirates are unacceptable for Xpert Xpress SARS-CoV-2/FLU/RSV testing.  Fact Sheet for  Patients: BloggerCourse.com  Fact Sheet for Healthcare Providers: SeriousBroker.it  This test is not yet approved or cleared by the [place] FDA and has been authorized for detection and/or diagnosis of SARS-CoV-2 by FDA under an Emergency Use Authorization (EUA). This EUA will remain in effect (meaning this test can be used) for the duration of the COVID-19 declaration under Section 564(b)(1) of the Act, 21 U.S.C. section 360bbb-3(b)(1), unless the authorization is terminated or revoked.  Performed at [facility], [address], [place], [place] [address]   I-Stat Lactic Acid, ED     Status: Abnormal   Collection Time: 10/14/22  5:39 PM  Result Value Ref Range   Lactic Acid, Venous 3.4 (HH) 0.5 - 1.9 mmol/L   Comment NOTIFIED PHYSICIAN   MRSA Next Gen by PCR, Nasal     Status: None   Collection Time: 10/14/22  8:27 PM   Specimen: Nasal Mucosa; Nasal Swab  Result Value Ref Range   MRSA by PCR Next Gen NOT DETECTED NOT DETECTED    Comment: (NOTE) The GeneXpert MRSA Assay (FDA approved for NASAL specimens only), is one component of a comprehensive MRSA colonization surveillance program. It is not intended to diagnose MRSA infection nor to guide or monitor treatment for MRSA infections. Test performance is not FDA approved in patients less than 10 years old. Performed at [facility], [address] [place], [place] [address]   Blood Culture (routine x 2)     Status: None (Preliminary result)   Collection Time: 10/14/22  9:34 PM   Specimen: BLOOD  Result Value Ref Range   Specimen Description BLOOD BLOOD LEFT HAND    Special Requests      BOTTLES DRAWN AEROBIC AND ANAEROBIC Blood Culture adequate volume   Culture      NO GROWTH < 12 HOURS Performed at [facility], [address] [place], [place] [address]    Report Status PENDING   Lactic acid, plasma     Status: Abnormal   Collection Time:  10/14/22  9:34 PM  Result Value Ref Range   Lactic Acid, Venous 4.2 (HH) 0.5 - 1.9 mmol/L    Comment: CRITICAL RESULT CALLED TO, READ BACK BY AND VERIFIED WITH DETAREAUS,T RN 2211 10/14/22 AMIREHSANI F Performed at [facility], [address], [place], [place] [address]   CK     Status: Abnormal   Collection Time: 10/14/22  9:34 PM  Result Value Ref Range   Total CK 541 (H) 38 - 234 U/L    Comment: Performed at [facility], [address], [place], [place] [address]  Basic metabolic panel     Status: Abnormal   Collection Time: 10/14/22  9:34 PM  Result Value Ref Range   Sodium 134 (L) 135 - 145 mmol/L   Potassium 2.1 (LL) 3.5 - 5.1 mmol/L    Comment: CRITICAL RESULT CALLED TO, READ BACK BY AND VERIFIED WITH DECAREAUS, T RN 2232 10/14/22 AMIREHSANIF   Chloride 100 98 - 111 mmol/L   CO2 14 (L) 22 - 32 mmol/L   Glucose, Bld 84 70 - 99 mg/dL    Comment: Glucose reference range applies only to samples taken after fasting for at least 8  hours.   BUN 63 (H) 8 - 23 mg/dL   Creatinine, Ser 1.61 (H) 0.44 - 1.00 mg/dL   Calcium 7.7 (L) 8.9 - 10.3 mg/dL   GFR, Estimated 9 (L) >60 mL/min    Comment: (NOTE) Calculated using the CKD-EPI Creatinine Equation (2021)    Anion gap 20 (H) 5 - 15    Comment: Performed at [facility], [address] [place], [place] [address]  Magnesium     Status: Abnormal   Collection Time: 10/14/22  9:34 PM  Result Value Ref Range   Magnesium 0.7 (LL) 1.7 - 2.4 mg/dL    Comment: IST ATTEMPT TO CALL 2330 CRITICAL RESULT CALLED TO, READ BACK BY AND VERIFIED WITH Excell Seltzer, A RN 2348 10/14/22 AMIREHSANI F Performed at [facility], [address], [place], [place] [address]   Ferritin     Status: Abnormal   Collection Time: 10/15/22  5:40 AM  Result Value Ref Range   Ferritin 590 (H) 11 - 307 ng/mL    Comment: Performed at [facility], [address], [place], [place] [address]  Vitamin B12     Status: None   Collection Time:  10/15/22  5:40 AM  Result Value Ref Range   Vitamin B-12 894 180 - 914 pg/mL    Comment: (NOTE) This assay is not validated for testing neonatal or myeloproliferative syndrome specimens for Vitamin B12 levels. Performed at [facility], [address], [place], [place] [address]   Iron and TIBC     Status: Abnormal   Collection Time: 10/15/22  5:40 AM  Result Value Ref Range   Iron 6 (L) 28 - 170 ug/dL   TIBC 621 (L) 308 - 657 ug/dL   Saturation Ratios 5 (L) 10.4 - 31.8 %   UIBC 120 ug/dL    Comment: Performed at [facility], [address], [place], [place] [address]  Comprehensive metabolic panel     Status: Abnormal   Collection Time: 10/15/22  5:40 AM  Result Value Ref Range   Sodium 132 (L) 135 - 145 mmol/L   Potassium 2.1 (LL) 3.5 - 5.1 mmol/L    Comment: CRITICAL RESULT CALLED TO, READ BACK BY AND VERIFIED WITH DECAREAUX, T RN 10/15/22 0700 AMIREHSANI F   Chloride 105 98 - 111 mmol/L   CO2 14 (L) 22 - 32 mmol/L   Glucose, Bld 73 70 - 99 mg/dL    Comment: Glucose reference range applies only to samples taken after fasting for at least 8 hours.   BUN 65 (H) 8 - 23 mg/dL   Creatinine, Ser 2.95 (H) 0.44 - 1.00 mg/dL   Calcium 7.4 (L) 8.9 - 10.3 mg/dL   Total Protein 5.1 (L) 6.5 - 8.1 g/dL   Albumin 1.8 (L) 3.5 - 5.0 g/dL   AST 49 (H) 15 - 41 U/L   ALT 23 0 - 44 U/L   Alkaline Phosphatase 84 38 - 126 U/L   Total Bilirubin 4.2 (H) 0.3 - 1.2 mg/dL   GFR, Estimated 11 (L) >60 mL/min    Comment: (NOTE) Calculated using the CKD-EPI Creatinine Equation (2021)    Anion gap 13 5 - 15    Comment: Performed at [facility], [address], [place], [place] [address]  CBC     Status: Abnormal   Collection Time: 10/15/22  5:40 AM  Result Value Ref Range   WBC 14.7 (H) 4.0 - 10.5 K/uL  RBC 2.37 (L) 3.87 - 5.11 MIL/uL   Hemoglobin 8.0 (L) 12.0 - 15.0 g/dL    Comment: REPEATED TO VERIFY   HCT 21.7 (L) 36.0 - 46.0 %   MCV 91.6 80.0 - 100.0 fL   MCH 33.8 26.0 -  34.0 pg   MCHC 36.9 (H) 30.0 - 36.0 g/dL   RDW 95.6 21.3 - 08.6 %   Platelets 6 (LL) 150 - 400 K/uL    Comment: Immature Platelet Fraction may be clinically indicated, consider ordering this additional test [id number] CRITICAL VALUE NOTED.  VALUE IS CONSISTENT WITH PREVIOUSLY REPORTED AND CALLED VALUE. REPEATED TO VERIFY    nRBC 0.0 0.0 - 0.2 %    Comment: Performed at [facility], [address] [place], [place] [address]  Magnesium     Status: None   Collection Time: 10/15/22  5:40 AM  Result Value Ref Range   Magnesium 2.2 1.7 - 2.4 mg/dL    Comment: Performed at [facility], [address] [place], [place] [address]  Folate     Status: None   Collection Time: 10/15/22  5:40 AM  Result Value Ref Range   Folate 13.5 >5.9 ng/mL    Comment: Performed at [facility], [address] [place], [place] [address]  Phosphorus     Status: Abnormal   Collection Time: 10/15/22  5:40 AM  Result Value Ref Range   Phosphorus 1.8 (L) 2.5 - 4.6 mg/dL    Comment: Performed at [facility], [address] [place], [place] [address]     ROS:  Pertinent items are noted in HPI.  Physical Exam: Vitals:   10/15/22 0600 10/15/22 0800  BP: 137/64 (!) 142/79  Pulse: (!) 121 (!) 127  Resp: (!) 34 (!) 31  Temp:  99.1 F (37.3 C)  SpO2: 95% 93%     General: Patient looks older than stated age. Resting in bed in NAD HEENT: anicteric sclera. Dry mucus membranes Neck: No JVP or neck distention Heart: Tachycardia. No overt murmurs or gallops. No  Lungs: Speaking in full sentences. CTAB. No rales or crackles. Abdomen: Mildly distended. Hyperactive bowel sounds. No tenderness to palpation.No shifting dullness Extremities: No edema. No angiomata.  Skin: Warm and dry. No rashes or petechiae.  Neuro: Alert and oriented. Dysarthria present. Mild tremor on bilateral hands. No flapping tremors.  Assessment/Plan: Sepsis secondary to E coli bacteremia suspected from  pyelonephritis vs acute gastroenteritis  -Evidenced of pyuria/bacteriuria on UA, bilateral perinephric stranding, small perinephric fluid collection, and dilatation of colon in setting of diarrheal illness on CT.  -Currently on CTX while awaiting sensitivities.  -No evidence of shock at this time. However patient persistently tachycardic, now improving with continuous fluid administration  Severe AKI: Suspect initially prerenal in setting of hypovolemia from poor PO intake, ongoing GI losses, sepsis, and consistent use of antihypertensive medications. Likely now progressed to ischemic ATN.  -Cr 5.37, GFR 9 on admission; today 4.43, GFR11 respectively -Currently undergoing volume resuscitation. Patient with ~521mL UOP since admission, not oliguric now. Will reassess in the AM for UOP with continuous fluid resuscitation. -Reassessment in the PM without worsening tachypnea or O2 requirement -Pyuria and bacteriuria on U/A. No Urine culture performed. No evidence of obstruction or hematuria on U/A -Previously on diuretics; will collect urine Na and calculate FeUrea to guide pre-renal vs intrarenal disease and guide volume resuscitation -No current indication for dialysis. However, will continue to monitor OUP and RFP for signs  acute need given severity of disease  AG metabolic acidosis with elevated lactic acid, resolving, in setting sepsis and severe AKI -Mildly tachypneic likely in the setting of AGMA -Continue supportive care and bicarb drip at current 145ml/HR  Hyponatremia: likely hypovolemic given history and clinical signs of volume depletion. -Continue on isotonic volume expansion  Hypokalemia: Seems to be chronically depleted, worsened in the setting of acute gastroenteritis. Will likely need continuous replacement -Replete K  and Mg as needed -Will follow up PM RFP for ongoing supplementation needs  Normocytic Anemia -  10.2 -> 8 on admission. No overt signs of bleeding. Likely in the  setting of acute infection. Sat ratio 5. Ferritin 590. Would hold off on iron replacement until bacteremia is addressed. No schistocytes on smear. Dilutional component likely contributing. -Will order DIC panel to further characterize.   Thrombocytopenia - 18 ->6 today.  Acute on chronic in setting of infection vs chronic nutritional deficiency 2/2 alcohol use disorder, vs  Normal platelet morphology. Differential includes  - no evidence of thrombotic microangiopathy on smear.  -Follow up DIC panel above  Per primary team: Hepatic steatosis with evidence of cirrhosis on imaging. No ascites reported on RUQ ultrasound or CT abdomen and pelvis.  EtOH use disorderConcern for refeeding syndromeprotein calorie malnutrition: Recommend consult to nutrition. Suspect tachycardia and tremor may indicate some component of withdrawal. Currently on CIWA with ativan protocol. Monitor K, Mg, and Phosphorus Seizure disorder: suspect in setting of EtOH use. Not on AED. Monitor as above Dysarthria:  has not passed swallow study. Limited PO intake. Will undergo DG swallow study today  Morene Crocker 10/15/2022, 9:53 AM

## 2022-10-15 NOTE — Progress Notes (Signed)
   10/14/22 2319  Assess: MEWS Score  BP 123/70  MAP (mmHg) 76  Pulse Rate (!) 130  ECG Heart Rate (!) 130  Resp (!) 22  Assess: MEWS Score  MEWS Temp 0  MEWS Systolic 0  MEWS Pulse 3  MEWS RR 1  MEWS LOC 0  MEWS Score 4  MEWS Score Color Red  Assess: if the MEWS score is Yellow or Red  Were vital signs accurate and taken at a resting state? Yes  Does the patient meet 2 or more of the SIRS criteria? Yes  Does the patient have a confirmed or suspected source of infection? Yes  MEWS guidelines implemented  Yes, red  Treat  MEWS Interventions Considered administering scheduled or prn medications/treatments as ordered  Take Vital Signs  Increase Vital Sign Frequency  Red: Q1hr x2, continue Q4hrs until patient remains green for 12hrs  Escalate  MEWS: Escalate Red: Discuss with charge nurse and notify provider. Consider notifying RRT. If remains red for 2 hours consider need for higher level of care  Notify: Charge Nurse/RN  Name of Charge Nurse/RN Notified april  Provider Notification  Provider Name/Title Dr. Joneen Roach  Date Provider Notified 10/15/22  Time Provider Notified 0000  Method of Notification Call  Notification Reason Critical Result  Provider response See new orders  Date of Provider Response 10/15/22  Time of Provider Response 0000  Notify: Rapid Response  Name of Rapid Response RN Notified Dave  Date Rapid Response Notified 10/15/22  Time Rapid Response Notified 2330  Assess: SIRS CRITERIA  SIRS Temperature  0  SIRS Pulse 1  SIRS Respirations  1  SIRS WBC 0  SIRS Score Sum  2

## 2022-10-16 ENCOUNTER — Inpatient Hospital Stay (HOSPITAL_COMMUNITY): Payer: MEDICAID

## 2022-10-16 DIAGNOSIS — A419 Sepsis, unspecified organism: Secondary | ICD-10-CM | POA: Diagnosis not present

## 2022-10-16 DIAGNOSIS — R6520 Severe sepsis without septic shock: Secondary | ICD-10-CM | POA: Diagnosis not present

## 2022-10-16 LAB — CBC WITH DIFFERENTIAL/PLATELET
Abs Immature Granulocytes: 0.63 10*3/uL — ABNORMAL HIGH (ref 0.00–0.07)
Basophils Absolute: 0 10*3/uL (ref 0.0–0.1)
Basophils Relative: 0 %
Eosinophils Absolute: 0.1 10*3/uL (ref 0.0–0.5)
Eosinophils Relative: 1 %
HCT: 19.6 % — ABNORMAL LOW (ref 36.0–46.0)
Hemoglobin: 7.2 g/dL — ABNORMAL LOW (ref 12.0–15.0)
Immature Granulocytes: 5 %
Lymphocytes Relative: 7 %
Lymphs Abs: 0.8 10*3/uL (ref 0.7–4.0)
MCH: 32.6 pg (ref 26.0–34.0)
MCHC: 36.7 g/dL — ABNORMAL HIGH (ref 30.0–36.0)
MCV: 88.7 fL (ref 80.0–100.0)
Monocytes Absolute: 0.4 10*3/uL (ref 0.1–1.0)
Monocytes Relative: 4 %
Neutro Abs: 10.4 10*3/uL — ABNORMAL HIGH (ref 1.7–7.7)
Neutrophils Relative %: 83 %
Platelets: 8 10*3/uL — CL (ref 150–400)
RBC: 2.21 MIL/uL — ABNORMAL LOW (ref 3.87–5.11)
RDW: 14.5 % (ref 11.5–15.5)
WBC: 12.4 10*3/uL — ABNORMAL HIGH (ref 4.0–10.5)
nRBC: 0 % (ref 0.0–0.2)

## 2022-10-16 LAB — GASTROINTESTINAL PANEL BY PCR, STOOL (REPLACES STOOL CULTURE)

## 2022-10-16 LAB — BPAM PLATELET PHERESIS
Blood Product Expiration Date: 202409112359
ISSUE DATE / TIME: 202409101448
Unit Type and Rh: 5100

## 2022-10-16 LAB — LACTIC ACID, PLASMA
Lactic Acid, Venous: 2.1 mmol/L (ref 0.5–1.9)
Lactic Acid, Venous: 2 mmol/L (ref 0.5–1.9)

## 2022-10-16 LAB — CBC
HCT: 20.3 % — ABNORMAL LOW (ref 36.0–46.0)
Hemoglobin: 7.6 g/dL — ABNORMAL LOW (ref 12.0–15.0)
MCH: 33.2 pg (ref 26.0–34.0)
MCHC: 37.4 g/dL — ABNORMAL HIGH (ref 30.0–36.0)
MCV: 88.6 fL (ref 80.0–100.0)
Platelets: 9 10*3/uL — CL (ref 150–400)
RBC: 2.29 MIL/uL — ABNORMAL LOW (ref 3.87–5.11)
RDW: 14.6 % (ref 11.5–15.5)
WBC: 14.7 10*3/uL — ABNORMAL HIGH (ref 4.0–10.5)
nRBC: 0 % (ref 0.0–0.2)

## 2022-10-16 LAB — COMPREHENSIVE METABOLIC PANEL
ALT: 23 U/L (ref 0–44)
AST: 63 U/L — ABNORMAL HIGH (ref 15–41)
Albumin: 1.6 g/dL — ABNORMAL LOW (ref 3.5–5.0)
Alkaline Phosphatase: 108 U/L (ref 38–126)
Anion gap: 13 (ref 5–15)
BUN: 58 mg/dL — ABNORMAL HIGH (ref 8–23)
CO2: 24 mmol/L (ref 22–32)
Calcium: 7.7 mg/dL — ABNORMAL LOW (ref 8.9–10.3)
Chloride: 97 mmol/L — ABNORMAL LOW (ref 98–111)
Creatinine, Ser: 3.52 mg/dL — ABNORMAL HIGH (ref 0.44–1.00)
GFR, Estimated: 14 mL/min — ABNORMAL LOW (ref >60)
Glucose, Bld: 114 mg/dL — ABNORMAL HIGH (ref 70–99)
Potassium: 2.6 mmol/L — CL (ref 3.5–5.1)
Sodium: 134 mmol/L — ABNORMAL LOW (ref 135–145)
Total Bilirubin: 4.2 mg/dL — ABNORMAL HIGH (ref 0.3–1.2)
Total Protein: 4.6 g/dL — ABNORMAL LOW (ref 6.5–8.1)

## 2022-10-16 LAB — PREPARE PLATELET PHERESIS: Unit division: 0

## 2022-10-16 LAB — RENAL FUNCTION PANEL
Albumin: 1.6 g/dL — ABNORMAL LOW (ref 3.5–5.0)
Anion gap: 13 (ref 5–15)
BUN: 62 mg/dL — ABNORMAL HIGH (ref 8–23)
CO2: 25 mmol/L (ref 22–32)
Calcium: 7.7 mg/dL — ABNORMAL LOW (ref 8.9–10.3)
Chloride: 96 mmol/L — ABNORMAL LOW (ref 98–111)
Creatinine, Ser: 3.55 mg/dL — ABNORMAL HIGH (ref 0.44–1.00)
GFR, Estimated: 14 mL/min — ABNORMAL LOW (ref >60)
Glucose, Bld: 111 mg/dL — ABNORMAL HIGH (ref 70–99)
Phosphorus: 2.1 mg/dL — ABNORMAL LOW (ref 2.5–4.6)
Potassium: 3.3 mmol/L — ABNORMAL LOW (ref 3.5–5.1)
Sodium: 134 mmol/L — ABNORMAL LOW (ref 135–145)

## 2022-10-16 LAB — GLUCOSE, CAPILLARY
Glucose-Capillary: 139 mg/dL — ABNORMAL HIGH (ref 70–99)
Glucose-Capillary: 123 mg/dL — ABNORMAL HIGH (ref 70–99)

## 2022-10-16 LAB — C DIFFICILE QUICK SCREEN W PCR REFLEX
C Diff antigen: NEGATIVE
C Diff interpretation: NOT DETECTED
C Diff toxin: NEGATIVE

## 2022-10-16 LAB — MAGNESIUM
Magnesium: 1.8 mg/dL (ref 1.7–2.4)
Magnesium: 2.5 mg/dL — ABNORMAL HIGH (ref 1.7–2.4)

## 2022-10-16 LAB — UREA NITROGEN, URINE: Urea Nitrogen, Ur: 310 mg/dL

## 2022-10-16 LAB — PHOSPHORUS: Phosphorus: 1.5 mg/dL — ABNORMAL LOW (ref 2.5–4.6)

## 2022-10-16 MED ORDER — POTASSIUM PHOSPHATES 15 MMOLE/5ML IV SOLN
15.0000 mmol | Freq: Once | INTRAVENOUS | Status: AC
  Administered 2022-10-16: 15 mmol via INTRAVENOUS
  Filled 2022-10-16: qty 5

## 2022-10-16 MED ORDER — POTASSIUM CHLORIDE 10 MEQ/100ML IV SOLN
10.0000 meq | INTRAVENOUS | Status: AC
  Administered 2022-10-16 (×4): 10 meq via INTRAVENOUS
  Filled 2022-10-16 (×4): qty 100

## 2022-10-16 MED ORDER — SODIUM CHLORIDE 0.9% IV SOLUTION: Freq: Once | INTRAVENOUS | Status: AC

## 2022-10-16 MED ORDER — FUROSEMIDE 10 MG/ML IJ SOLN
120.0000 mg | Freq: Once | INTRAVENOUS | Status: AC
  Administered 2022-10-16: 120 mg via INTRAVENOUS
  Filled 2022-10-16: qty 10

## 2022-10-16 MED ORDER — MAGNESIUM SULFATE 2 GM/50ML IV SOLN
2.0000 g | Freq: Once | INTRAVENOUS | Status: AC
  Administered 2022-10-16: 2 g via INTRAVENOUS
  Filled 2022-10-16: qty 50

## 2022-10-16 MED ORDER — FUROSEMIDE 10 MG/ML IJ SOLN
80.0000 mg | Freq: Once | INTRAMUSCULAR | Status: AC
  Administered 2022-10-16: 80 mg via INTRAVENOUS
  Filled 2022-10-16: qty 8

## 2022-10-16 NOTE — Progress Notes (Signed)
Patient c/o feeling the urge to urinate. Foley output overnight, 90mL for this shift.  Bladder scan showed 421, repeat scan 370.  Night shift reported irrigating and retrieving large clot.  Irrigation attempted again with no success.  MD rounded on patient and ordered to exchange foley.    Foley exchanged, pink urine returned.

## 2022-10-16 NOTE — Consult Note (Signed)
Hematology/Oncology Consult Note  Clinical Summary: Mrs. Munachimso [place]. Den[place] is a 62 year old female with medical history significant for polysubstance abuse and cirrhosis with chronically low platelets who presents with severe thrombocytopenia in the setting of severe sepsis secondary to E. coli bacteremia.  Reason for Consult: Severe Thrombocytopenia  HPI: Mrs. Durga Mino. Rippy is a 62 year old female with medical history significant for polysubstance abuse and cirrhosis, hypertension, malnutrition, seizures, and chronic thrombocytopenia who presents with severe sepsis secondary to E. coli bacteremia.  The patient initially presented to the emergency department on 10/14/2022 after presenting with generalized weakness.  History at the time of initial H&P was obtained from the medical records as well as the patient's significant other.  Initial labs showed white blood cell count 17.2, hemoglobin 10.2, MCV 95, and platelets of 18.  Previously on 07/02/2022 patient had white blood cell count of 6.0, hemoglobin 9.9, and platelets of 64.  Additionally the patient was noted to have a creatinine of 5.37 with calcium of 7.7 and potassium of 2.0.  Previously the patient had a creatinine of 0.55.  Due to concern for these findings the patient was admitted.  On exam today Ms. Sellers reports ***  O:  Vitals:   10/16/22 1530 10/16/22 1629  BP: (!) 140/80 124/61  Pulse: (!) 113   Resp: (!) 30   Temp: 97.6 F (36.4 C)   SpO2: 99%       Latest Ref Rng & Units 10/16/2022   12:00 PM 10/16/2022    6:00 AM 10/15/2022    4:00 PM  CMP  Glucose 70 - 99 mg/dL 478  295  621   BUN 8 - 23 mg/dL 62  58  63   Creatinine 0.44 - 1.00 mg/dL 3.08  6.57  8.46   Sodium 135 - 145 mmol/L 134  134  134   Potassium 3.5 - 5.1 mmol/L 3.3  2.6  2.8   Chloride 98 - 111 mmol/L 96  97  97   CO2 22 - 32 mmol/L 25  24  18    Calcium 8.9 - 10.3 mg/dL 7.7  7.7  7.6   Total Protein 6.5 - 8.1 g/dL  4.6    Total Bilirubin 0.3 - 1.2  mg/dL  4.2    Alkaline Phos 38 - 126 U/L  108    AST 15 - 41 U/L  63    ALT 0 - 44 U/L  23        Latest Ref Rng & Units 10/16/2022    6:00 AM 10/15/2022    8:35 PM 10/15/2022   11:53 AM  CBC  WBC 4.0 - 10.5 K/uL 12.4  12.2    Hemoglobin 12.0 - 15.0 g/dL 7.2  7.2    Hematocrit 36.0 - 46.0 % 19.6  19.3    Platelets 150 - 400 K/uL 8  15  <5       GENERAL: well appearing *** in NAD  SKIN: skin color, texture, turgor are normal, no rashes or significant lesions EYES: conjunctiva are pink and non-injected, sclera clear LUNGS: clear to auscultation and percussion with normal breathing effort HEART: regular rate & rhythm and no murmurs and no lower extremity edema Musculoskeletal: no cyanosis of digits and no clubbing  PSYCH: alert & oriented x 3, fluent speech NEURO: no focal motor/sensory deficits  Assessment/Plan:  # Severe Thrombocytopenia -- At this time findings are consistent with a acute worsening of her thrombocytopenia due to critical illness/inflammation. -- Patient has a baseline platelet  count of approximately 60-70 due to her liver disease.  Strongly suspect the inflammation from recurrent infection is suppressing blood counts further. -- Initial labs show an immature platelet fraction of 7.3%, implying marrow suppression.  Reticulocytes are also blunted at 0.6%. -- Initial nutritional evaluation showed vitamin B12 of 894 as well as folate of 13.5.  Unlikely her thrombocytopenia secondary to nutritional deficiency. -- CT chest abdomen pelvis without contrast on 10/14/2022 showed hepatic steatosis with no evidence of splenomegaly. -- No evidence of hemolysis with modest LDH, haptoglobin pending.  Bilirubin at 4.2, though baseline is elevated. -- Recommend platelet transfusion for platelets less than 10 or if patient is bleeding platelets less than 20. --No clear indication for bone marrow biopsy at this time. -- Hematology service will continue to follow.  Ulysees Barns,  MD Department of Hematology/Oncology [facility] Phone: [phone] Pager: [phone] Email: [email or web address]

## 2022-10-16 NOTE — Plan of Care (Signed)
  Problem: Clinical Measurements: Goal: Ability to maintain clinical measurements within normal limits will improve Outcome: Progressing Goal: Will remain free from infection Outcome: Progressing Goal: Diagnostic test results will improve Outcome: Progressing Goal: Cardiovascular complication will be avoided Outcome: Progressing   Problem: Activity: Goal: Risk for activity intolerance will decrease Outcome: Progressing   Problem: Nutrition: Goal: Adequate nutrition will be maintained Outcome: Progressing

## 2022-10-16 NOTE — Progress Notes (Signed)
PROGRESS NOTE    Brandy Bishop  ZOX:[id number] DOB: 02-12-1960 DOA: 10/14/2022 PCP: Cline Crock, NP   Brief Narrative:  62 year old female with history of alcohol abuse, pancreatitis, seizures, hypertension presented with generalized weakness and shortness along with nausea, vomiting and diarrhea along with confusion.  Reliability of the history on presentation was poor and patient was dysarthric on initial exam (significant other reported that her last alcoholic drink was 1 day prior to presentation).  On presentation, patient was hypotensive with creatinine of 5.37, potassium of 2, sodium of 133, bicarb of 15, alkaline phosphatase 144, AST 66 with WBCs of 17.2, platelets of 18 and lactic acid of 4.4.  UA was suggestive of UTI.  She was started on IV fluids and antibiotics.  PCCM was consulted who recommended IV fluids and antibiotics and admission to [facility] service.  CT of head without contrast showed no acute abnormality.  CT of abdomen and pelvis without contrast showed possible bilateral perinephric stranding, could be related to pyelonephritis; hepatic steatosis with probable cirrhosis along with diverticular disease of the colon without wall thickening; possibility of diarrheal illness.  Right upper quadrant ultrasound showed hepatic steatosis and or hepatocellular disease.  Influenza/COVID-19 testing was negative.  Assessment & Plan:   Severe sepsis: Present on admission Probable acute bilateral pyelonephritis E. coli bacteremia Presented with fever, leukocytosis, tachycardia, tachypnea with pyuria and imaging evidence of possible acute bilateral pyelonephritis Blood culture positive for E. coli.  Follow sensitivities Continue Rocephin Monitor closely  Volume overload Tachypneic to the 30s-40s Chest x-ray with increased pulmonary edema, volume overload Stop IV fluid Diurese with Lasix Continue supplemental O2, BiPAP as needed  Acute kidney injury Acute metabolic  acidosis Creatinine 8.11 on presentation; was 0.55 on 07/02/2022. S/p IVF, bicarb drip, stopped due to pulmonary edema Nephrology on board  Noted hematuria Foley placement with gross hematuria noted Noted thrombocytopenia Monitor closely CBC  Acute metabolic encephalopathy Generalized weakness Dysarthria Etiology multifactorial, alcohol withdrawal, sepsis CT of the head was negative for acute intracranial abnormity May need MRI brain if symptoms persist Fall precautions.  PT evaluation once more stable.  SLP eval.  Hepatic steatosis with probable cirrhosis Elevated LFTs Likely from alcohol abuse.  Patient needs to abstain from alcohol.  Monitor LFTs Will possibly need GI workup and follow-up as an outpatient  Acute on chronic thrombocytopenia Possibly from current illnesses Patient possibly has chronic thrombocytopenia from alcohol abuse Transfused 2 unit of platelets so far Requested hematology evaluation: Dr Hanley Ben communicated with Dr. Leonides Schanz via secure chat, appreciate recs  Anemia of chronic disease/iron deficiency anemia Baseline hemoglobin around 9-10 Daily CBC  Alcohol abuse with possible withdrawal Last drink was a day prior to presentation as per significant other Currently on high-dose thiamine.  Continue folic acid and multivitamin.  Continue CIWA protocol. TOC consult  Multiple electrolyte derangements Possible refeeding syndrome Hypokalemia/hypomagnesemia/hypophosphatemia Replete electrolytes aggressively Nutrition consulted   DVT prophylaxis: SCDs Code Status: Full Family Communication: None at bedside Disposition Plan: Status is: Inpatient Remains inpatient appropriate because: Of severity of illness.    Consultants: PCCM/nephrology.  Hematology  Procedures: None  Antimicrobials:  Anti-infectives (From admission, onward)    Start     Dose/Rate Route Frequency Ordered Stop   10/15/22 1000  cefTRIAXone (ROCEPHIN) 1 g in sodium chloride 0.9 %  100 mL IVPB  Status:  Discontinued        1 g 200 mL/hr over 30 Minutes Intravenous Every 24 hours 10/14/22 1746 10/15/22 0018   10/15/22 1000  cefTRIAXone (  ROCEPHIN) 2 g in sodium chloride 0.9 % 100 mL IVPB        2 g 200 mL/hr over 30 Minutes Intravenous Every 24 hours 10/15/22 0018     10/14/22 1000  ceFEPIme (MAXIPIME) 2 g in sodium chloride 0.9 % 100 mL IVPB        2 g 200 mL/hr over 30 Minutes Intravenous  Once 10/14/22 0946 10/14/22 1541   10/14/22 0945  aztreonam (AZACTAM) 2 g in sodium chloride 0.9 % 100 mL IVPB  Status:  Discontinued        2 g 200 mL/hr over 30 Minutes Intravenous  Once 10/14/22 0943 10/14/22 0946   10/14/22 0945  metroNIDAZOLE (FLAGYL) IVPB 500 mg        500 mg 100 mL/hr over 60 Minutes Intravenous  Once 10/14/22 0943 10/14/22 1309   10/14/22 0945  vancomycin (VANCOCIN) IVPB 1000 mg/200 mL premix        1,000 mg 200 mL/hr over 60 Minutes Intravenous  Once 10/14/22 0943 10/14/22 1643        Subjective: Today, patient appears very lethargic, with increased work of breathing.  Likely from volume overload.   Objective: Vitals:   10/16/22 1429 10/16/22 1530 10/16/22 1629 10/16/22 1800  BP: (!) 139/90 (!) 140/80 124/61   Pulse:  (!) 113    Resp:  (!) 30    Temp: 97.8 F (36.6 C) 97.6 F (36.4 C)  97.8 F (36.6 C)  TempSrc: Axillary Axillary  Axillary  SpO2: 92% 99%    Weight:      Height:        Intake/Output Summary (Last 24 hours) at 10/16/2022 1944 Last data filed at 10/16/2022 1940 Gross per 24 hour  Intake 4746.13 ml  Output 2170 ml  Net 2576.13 ml   Filed Weights   10/14/22 1912 10/15/22 0541 10/16/22 0532  Weight: 45.3 kg 46 kg 52.4 kg    Examination: General: NAD, lethargic, cachetic, chronically ill-appearing, deconditioned Cardiovascular: S1, S2 present Respiratory: Diminished breath sounds bilaterally Abdomen: Soft, nontender, distended, bowel sounds present Musculoskeletal: No bilateral pedal edema noted Skin:  Normal Psychiatry: Poor mood    Data Reviewed: I have personally reviewed following labs and imaging studies  CBC: Recent Labs  Lab 10/14/22 0909 10/15/22 0540 10/15/22 1153 10/15/22 2035 10/16/22 0600  WBC 17.2* 14.7*  --  12.2* 12.4*  NEUTROABS 15.1*  --   --   --  10.4*  HGB 10.2* 8.0*  --  7.2* 7.2*  HCT 28.4* 21.7*  --  19.3* 19.6*  MCV 95.0 91.6  --  90.6 88.7  PLT 18* 6* <5* 15* 8*   Basic Metabolic Panel: Recent Labs  Lab 10/14/22 2134 10/15/22 0540 10/15/22 1600 10/16/22 0600 10/16/22 1200  NA 134* 132* 134* 134* 134*  K 2.1* 2.1* 2.8* 2.6* 3.3*  CL 100 105 97* 97* 96*  CO2 14* 14* 18* 24 25  GLUCOSE 84 73 188* 114* 111*  BUN 63* 65* 63* 58* 62*  CREATININE 4.96* 4.43* 4.00* 3.52* 3.55*  CALCIUM 7.7* 7.4* 7.6* 7.7* 7.7*  MG 0.7* 2.2  --  1.8 2.5*  PHOS  --  1.8* 3.4 1.5* 2.1*   GFR: Estimated Creatinine Clearance: 13.8 mL/min (A) (by C-G formula based on SCr of 3.55 mg/dL (H)). Liver Function Tests: Recent Labs  Lab 10/14/22 0909 10/15/22 0540 10/15/22 1600 10/16/22 0600 10/16/22 1200  AST 66* 49*  --  63*  --   ALT 30 23  --  23  --   ALKPHOS 144* 84  --  108  --   BILITOT 3.9* 4.2*  --  4.2*  --   PROT 6.9 5.1*  --  4.6*  --   ALBUMIN 2.5* 1.8* 1.7* 1.6* 1.6*   No results for input(s): "LIPASE", "AMYLASE" in the last 168 hours. No results for input(s): "AMMONIA" in the last 168 hours. Coagulation Profile: Recent Labs  Lab 10/14/22 0909 10/15/22 1153  INR 1.4* 1.5*   Cardiac Enzymes: Recent Labs  Lab 10/14/22 2134  CKTOTAL 541*   BNP (last 3 results) No results for input(s): "PROBNP" in the last 8760 hours. HbA1C: No results for input(s): "HGBA1C" in the last 72 hours. CBG: Recent Labs  Lab 10/15/22 2115 10/16/22 0513  GLUCAP 139* 123*   Lipid Profile: No results for input(s): "CHOL", "HDL", "LDLCALC", "TRIG", "CHOLHDL", "LDLDIRECT" in the last 72 hours. Thyroid Function Tests: No results for input(s): "TSH", "T4TOTAL",  "FREET4", "T3FREE", "THYROIDAB" in the last 72 hours. Anemia Panel: Recent Labs    10/15/22 0540 10/15/22 2035  VITAMINB12 894  --   FOLATE 13.5  --   FERRITIN 590*  --   TIBC 126*  --   IRON 6*  --   RETICCTPCT  --  0.6   Sepsis Labs: Recent Labs  Lab 10/14/22 1739 10/14/22 2134 10/16/22 0948 10/16/22 1145  LATICACIDVEN 3.4* 4.2* 2.1* 2.0*    Recent Results (from the past 240 hour(s))  Blood Culture (routine x 2)     Status: Abnormal (Preliminary result)   Collection Time: 10/14/22  9:40 AM   Specimen: BLOOD  Result Value Ref Range Status   Specimen Description BLOOD LEFT ANTECUBITAL  Final   Special Requests   Final    BOTTLES DRAWN AEROBIC AND ANAEROBIC Blood Culture results may not be optimal due to an excessive volume of blood received in culture bottles   Culture  Setup Time   Final    GRAM NEGATIVE RODS IN BOTH AEROBIC AND ANAEROBIC BOTTLES CRITICAL RESULT CALLED TO, READ BACK BY AND VERIFIED WITH: PHARMD G. ABBOTT 10/15/22 @ 0011 BY AB    Culture (A)  Final    ESCHERICHIA COLI SUSCEPTIBILITIES TO FOLLOW Performed at [facility], [address] [place], [place] [address]    Report Status PENDING  Incomplete  Blood Culture ID Panel (Reflexed)     Status: Abnormal   Collection Time: 10/14/22  9:40 AM  Result Value Ref Range Status   Enterococcus faecalis NOT DETECTED NOT DETECTED Final   Enterococcus Faecium NOT DETECTED NOT DETECTED Final   Listeria monocytogenes NOT DETECTED NOT DETECTED Final   Staphylococcus species NOT DETECTED NOT DETECTED Final   Staphylococcus aureus (BCID) NOT DETECTED NOT DETECTED Final   Staphylococcus epidermidis NOT DETECTED NOT DETECTED Final   Staphylococcus lugdunensis NOT DETECTED NOT DETECTED Final   Streptococcus species NOT DETECTED NOT DETECTED Final   Streptococcus agalactiae NOT DETECTED NOT DETECTED Final   Streptococcus pneumoniae NOT DETECTED NOT DETECTED Final   Streptococcus pyogenes NOT DETECTED NOT  DETECTED Final   A.calcoaceticus-baumannii NOT DETECTED NOT DETECTED Final   Bacteroides fragilis NOT DETECTED NOT DETECTED Final   Enterobacterales DETECTED (A) NOT DETECTED Final    Comment: Enterobacterales represent a large order of gram negative bacteria, not a single organism. CRITICAL RESULT CALLED TO, READ BACK BY AND VERIFIED WITH: PHARMD G. ABBOTT 10/15/22 @ 0011 BY AB    Enterobacter cloacae complex NOT DETECTED NOT DETECTED Final   Escherichia  coli DETECTED (A) NOT DETECTED Final    Comment: CRITICAL RESULT CALLED TO, READ BACK BY AND VERIFIED WITH: PHARMD G. ABBOTT 10/15/22 @ 0031 BY AB    Klebsiella aerogenes NOT DETECTED NOT DETECTED Final   Klebsiella oxytoca NOT DETECTED NOT DETECTED Final   Klebsiella pneumoniae NOT DETECTED NOT DETECTED Final   Proteus species NOT DETECTED NOT DETECTED Final   Salmonella species NOT DETECTED NOT DETECTED Final   Serratia marcescens NOT DETECTED NOT DETECTED Final   Haemophilus influenzae NOT DETECTED NOT DETECTED Final   Neisseria meningitidis NOT DETECTED NOT DETECTED Final   Pseudomonas aeruginosa NOT DETECTED NOT DETECTED Final   Stenotrophomonas maltophilia NOT DETECTED NOT DETECTED Final   Candida albicans NOT DETECTED NOT DETECTED Final   Candida auris NOT DETECTED NOT DETECTED Final   Candida glabrata NOT DETECTED NOT DETECTED Final   Candida krusei NOT DETECTED NOT DETECTED Final   Candida parapsilosis NOT DETECTED NOT DETECTED Final   Candida tropicalis NOT DETECTED NOT DETECTED Final   Cryptococcus neoformans/gattii NOT DETECTED NOT DETECTED Final   CTX-M ESBL NOT DETECTED NOT DETECTED Final   Carbapenem resistance IMP NOT DETECTED NOT DETECTED Final   Carbapenem resistance KPC NOT DETECTED NOT DETECTED Final   Carbapenem resistance NDM NOT DETECTED NOT DETECTED Final   Carbapenem resist OXA 48 LIKE NOT DETECTED NOT DETECTED Final   Carbapenem resistance VIM NOT DETECTED NOT DETECTED Final    Comment: Performed at [facility], [address] [place], [place] [address]  SARS Coronavirus 2 by RT PCR (hospital order, performed in [facility] hospital lab) *cepheid single result test* Anterior Nasal Swab     Status: None   Collection Time: 10/14/22  5:26 PM   Specimen: Anterior Nasal Swab  Result Value Ref Range Status   SARS Coronavirus 2 by RT PCR NEGATIVE NEGATIVE Final    Comment: Performed at [facility], [address] [place], [place] [address]  Resp Panel by RT-PCR (Flu A&B, Covid)     Status: None   Collection Time: 10/14/22  5:26 PM  Result Value Ref Range Status   SARS Coronavirus 2 by RT PCR NEGATIVE NEGATIVE Final   Influenza A by PCR NEGATIVE NEGATIVE Final   Influenza B by PCR NEGATIVE NEGATIVE Final    Comment: (NOTE) The Xpert Xpress SARS-CoV-2/FLU/RSV plus assay is intended as an aid in the diagnosis of influenza from Nasopharyngeal swab specimens and should not be used as a sole basis for treatment. Nasal washings and aspirates are unacceptable for Xpert Xpress SARS-CoV-2/FLU/RSV testing.  Fact Sheet for Patients: BloggerCourse.com  Fact Sheet for Healthcare Providers: SeriousBroker.it  This test is not yet approved or cleared by the [place] FDA and has been authorized for detection and/or diagnosis of SARS-CoV-2 by FDA under an Emergency Use Authorization (EUA). This EUA will remain in effect (meaning this test can be used) for the duration of the COVID-19 declaration under Section 564(b)(1) of the Act, 21 U.S.C. section 360bbb-3(b)(1), unless the authorization is terminated or revoked.  Performed at [facility], [address], [place], [place] [address]   MRSA Next Gen by PCR, Nasal     Status: None   Collection Time: 10/14/22  8:27 PM   Specimen: Nasal Mucosa; Nasal Swab  Result Value Ref Range Status   MRSA by PCR Next Gen NOT DETECTED NOT DETECTED Final    Comment: (NOTE) The GeneXpert MRSA  Assay (FDA approved for NASAL specimens only), is  one component of a comprehensive MRSA colonization surveillance program. It is not intended to diagnose MRSA infection nor to guide or monitor treatment for MRSA infections. Test performance is not FDA approved in patients less than 47 years old. Performed at [facility], [address], [place], [place] [address]   Blood Culture (routine x 2)     Status: None (Preliminary result)   Collection Time: 10/14/22  9:34 PM   Specimen: BLOOD LEFT HAND  Result Value Ref Range Status   Specimen Description BLOOD LEFT HAND  Final   Special Requests   Final    BOTTLES DRAWN AEROBIC AND ANAEROBIC Blood Culture adequate volume   Culture   Final    NO GROWTH 2 DAYS Performed at [facility], [address], [place], [place] [address]    Report Status PENDING  Incomplete  C Difficile Quick Screen w PCR reflex     Status: None   Collection Time: 10/15/22  6:09 PM   Specimen: STOOL  Result Value Ref Range Status   C Diff antigen NEGATIVE NEGATIVE Final   C Diff toxin NEGATIVE NEGATIVE Final   C Diff interpretation No C. difficile detected.  Final    Comment: Performed at [facility] Lab, [address], [place], [place] [address]         Radiology Studies: DG CHEST PORT 1 VIEW  Result Date: 10/16/2022 CLINICAL DATA:  Shortness of breath. EXAM: PORTABLE CHEST 1 VIEW COMPARISON:  Chest radiograph 10/14/2022. FINDINGS: Interval development of diffuse bilateral reticular opacities, which may reflect pulmonary edema or atypical/viral infection. No pleural effusion or pneumothorax. Stable cardiac and mediastinal contours. IMPRESSION: Interval development of diffuse bilateral reticular opacities, which may reflect pulmonary edema or atypical/viral infection. Electronically Signed   By: Orvan Falconer M.D.   On: 10/16/2022 10:42   DG Swallowing Func-Speech Pathology  Result Date: 10/15/2022 Table formatting from the original result  was not included. Modified Barium Swallow Study Patient Details Name: MACEY BRIEGER MRN: [id number] Date of Birth: 28-Dec-1960 Today's Date: 10/15/2022 HPI/PMH: HPI: Pt is a 62 y.o. female who presented with generalized aches and malaise x3-4 days. She noted nausea, emesis, and diarreha, but her significant other denied noting these symptoms. Increasing confusion noted from family. PMH is significant for etoh abuse, panreatitis, seizures, hypertension, and GIB. Clinical Impression: Clinical Impression: Patient presents with a moderate oral dysphagia and mild pharyngeal phase dysphagia as per this MBS. In addition, cognitive impairment impacted study, as patient was constantly moving her head around and required cues to reposition as well as to initiate swallow. Boluses of thin liquid, nectar thick liquids and puree solids were administered. Swallow was initiated at level of vallecular sinus. Epiglottic inversion was complete but anterior hyoid excursion and laryngeal elevation appeared partial in completion. Lingual control of bolus and tongue base retraction were both impaired, leading to poor bolus cohesion, reduced anterior to posterior oral transit of boluses. Mild amount of residuals observed in vallecular sinus, aryepiglottic folds, posterior pharyngeal wall and pyriform sinus with liquid boluses and moderate amount of vallecular residuals with puree solids were observed. Residuals. started to clear with subsequent swallows. Appearance of narrowing of space at PES observed which resulted in mild amount of barium remaining above PES after initial swallows before clearing during subsequent swallows. During esophageal sweep, appearance of decreased bolus cohesion and slow transit of puree solid barium observed. No aspiration or penetration seen. SLP recommending to continue with full liquids (thin) diet and  will follow for toleration and ability to advance. Factors that may increase risk of adverse event in  presence of aspiration Rubye Oaks & Clearance Coots 2021): Factors that may increase risk of adverse event in presence of aspiration Rubye Oaks & Clearance Coots 2021): Frail or deconditioned; Reduced cognitive function; Weak cough; Poor general health and/or compromised immunity Recommendations/Plan: Swallowing Evaluation Recommendations Swallowing Evaluation Recommendations Recommendations: PO diet PO Diet Recommendation: Full liquid diet; Thin liquids (Level 0) Liquid Administration via: Cup; Straw Medication Administration: Crushed with puree Supervision: Full supervision/cueing for swallowing strategies; Staff to assist with self-feeding; Patient able to self-feed Swallowing strategies  : Slow rate; Small bites/sips; Minimize environmental distractions Postural changes: Position pt fully upright for meals; Stay upright 30-60 min after meals Oral care recommendations: Oral care BID (2x/day) Treatment Plan Treatment Plan Treatment recommendations: Therapy as outlined in treatment plan below Follow-up recommendations: No SLP follow up Functional status assessment: Patient has had a recent decline in their functional status and demonstrates the ability to make significant improvements in function in a reasonable and predictable amount of time. Treatment frequency: Min 2x/week Treatment duration: 1 week Interventions: Aspiration precaution training; Trials of upgraded texture/liquids; Diet toleration management by SLP; Patient/family education Recommendations Recommendations for follow up therapy are one component of a multi-disciplinary discharge planning process, led by the attending physician.  Recommendations may be updated based on patient status, additional functional criteria and insurance authorization. Assessment: Orofacial Exam: Orofacial Exam Oral Cavity - Dentition: Adequate natural dentition Anatomy: Anatomy: WFL Boluses Administered: Boluses Administered Boluses Administered: Thin liquids (Level 0); Mildly thick liquids  (Level 2, nectar thick); Puree  Oral Impairment Domain: Oral Impairment Domain Lip Closure: No labial escape Tongue control during bolus hold: Not tested Bolus preparation/mastication: Slow prolonged chewing/mashing with complete recollection Bolus transport/lingual motion: Slow tongue motion Oral residue: Residue collection on oral structures Location of oral residue : Tongue; Palate Initiation of pharyngeal swallow : Valleculae  Pharyngeal Impairment Domain: Pharyngeal Impairment Domain Soft palate elevation: No bolus between soft palate (SP)/pharyngeal wall (PW) Laryngeal elevation: Complete superior movement of thyroid cartilage with complete approximation of arytenoids to epiglottic petiole Anterior hyoid excursion: Partial anterior movement Epiglottic movement: Complete inversion Laryngeal vestibule closure: Incomplete, narrow column air/contrast in laryngeal vestibule Pharyngeal stripping wave : Present - diminished Pharyngeal contraction (A/P view only): N/A Pharyngoesophageal segment opening: Partial distention/partial duration, partial obstruction of flow Tongue base retraction: Narrow column of contrast or air between tongue base and PPW Pharyngeal residue: Collection of residue within or on pharyngeal structures Location of pharyngeal residue: Valleculae; Aryepiglottic folds; Pyriform sinuses; Diffuse (>3 areas); Pharyngeal wall; Tongue base  Esophageal Impairment Domain: Esophageal Impairment Domain Esophageal clearance upright position: Complete clearance, esophageal coating Pill: No data recorded Penetration/Aspiration Scale Score: Penetration/Aspiration Scale Score 1.  Material does not enter airway: Thin liquids (Level 0); Mildly thick liquids (Level 2, nectar thick); Puree Compensatory Strategies: Compensatory Strategies Compensatory strategies: Yes Straw: Effective Effective Straw: Thin liquid (Level 0)   General Information: Caregiver present: No  Diet Prior to this Study: Full liquid diet; Thin  liquids (Level 0)   Temperature : Normal   Respiratory Status: WFL   Supplemental O2: None (Room air)   History of Recent Intubation: No  Behavior/Cognition: Alert; Cooperative; Confused; Requires cueing; Distractible Self-Feeding Abilities: Able to self-feed; Needs set-up for self-feeding Baseline vocal quality/speech: Normal Volitional Cough: Unable to elicit Volitional Swallow: Able to elicit Exam Limitations: Excessive movement Goal Planning: Prognosis for improved oropharyngeal function: Good Barriers to Reach Goals: Cognitive deficits No data recorded  Patient/Family Stated Goal: None stated Consulted and agree with results and recommendations: Pt unable/family or caregiver not available Pain: Pain Assessment Pain Assessment: No/denies pain End of Session: Start Time:SLP Start Time (ACUTE ONLY): 1400 Stop Time: SLP Stop Time (ACUTE ONLY): 1415 Time Calculation:SLP Time Calculation (min) (ACUTE ONLY): 15 min Charges: SLP Evaluations $ SLP Speech Visit: 1 Visit SLP Evaluations $BSS Swallow: 1 Procedure $MBS Swallow: 1 Procedure SLP visit diagnosis: SLP Visit Diagnosis: Dysphagia, oropharyngeal phase (R13.12) Past Medical History: Past Medical History: Diagnosis Date  Alcoholism (HCC) 2011  Asthma   Chronic lower back pain   Colitis 04/2014.  Bloody diarrhea  Daily headache   GIB (gastrointestinal bleeding)   Hypertension   Pancreatitis   Chronic pancreatitis noted on CT scan in 04/2014.  Polysubstance abuse (HCC) 2011  Pyelonephritis 05/2012  Seizures (HCC)   last seizure was 04/2019 per patient  Past Surgical History: Past Surgical History: Procedure Laterality Date  COLONOSCOPY WITH PROPOFOL N/A 09/09/2019  Procedure: COLONOSCOPY WITH PROPOFOL;  Surgeon: Charlott Rakes, MD;  Location: [facility] ENDOSCOPY;  Service: Endoscopy;  Laterality: N/A;  ESOPHAGOGASTRODUODENOSCOPY N/A 05/31/2015  Procedure: ESOPHAGOGASTRODUODENOSCOPY (EGD);  Surgeon: Hilarie Fredrickson, MD;  Location: [facility] ENDOSCOPY;  Service: Endoscopy;  Laterality:  N/A;  ESOPHAGOGASTRODUODENOSCOPY N/A 09/09/2019  Procedure: ESOPHAGOGASTRODUODENOSCOPY (EGD);  Surgeon: Charlott Rakes, MD;  Location: [facility] ENDOSCOPY;  Service: Endoscopy;  Laterality: N/A;  ORIF HUMERUS FRACTURE Right 05/12/2019  Procedure: OPEN REDUCTION INTERNAL FIXATION (ORIF) DISTAL HUMERUS FRACTURE;  Surgeon: Bjorn Pippin, MD;  Location: [facility] ORS;  Service: Orthopedics;  Laterality: Right; Angela Nevin, MA, CCC-SLP Speech Therapy        Scheduled Meds:  Chlorhexidine Gluconate Cloth  6 each Topical Q0600   folic acid  1 mg Oral Daily   multivitamin with minerals  1 tablet Oral Daily   [START ON 10/22/2022] thiamine  100 mg Oral Daily   Continuous Infusions:  cefTRIAXone (ROCEPHIN)  IV Stopped (10/16/22 1023)   thiamine (VITAMIN B1) injection Stopped (10/16/22 1356)   Followed by   Melene Muller ON 10/17/2022] thiamine (VITAMIN B1) injection            Briant Cedar, MD [facility] 10/16/2022, 7:44 PM

## 2022-10-16 NOTE — Consult Note (Addendum)
NAME:  Brandy Bishop, MRN:  [id number], DOB:  [birth year], LOS: 2 ADMISSION DATE:  10/14/2022, CONSULTATION DATE:  10/14/2022 REFERRING MD:  Jeraldine Loots - EDP, CHIEF COMPLAINT:  Weakness, dehydration   History of Present Illness:  Brandy Bishop is a 62 y.o. female who has a PMH as below. She presented to [facility] ED 9/9 with weakness and aches. She has had decreased PO intake x 2 - 3 days due to no appetite and weakness. She denies any recent fevers/chills/sweats, chest pain, dyspnea, abd pain, N/V/D, abd pain, myalgias.  She was found to have new AKI, hypokalemia, hyponatremia, elevated lactate, AGMA. She was also significantly dehydrated on ED presentation.  BP remained stable throughout. PCCM asked to see in consultation given AKI, lactic acidosis.  PCCM reconsulted 9/11 for increased WOB, tachypnea new O2 requirement.  Pertinent Medical History:   Past Medical History:  Diagnosis Date   Alcoholism (HCC) 2011   Asthma    Chronic lower back pain    Colitis 04/2014.   Bloody diarrhea   Daily headache    GIB (gastrointestinal bleeding)    Hypertension    Pancreatitis    Chronic pancreatitis noted on CT scan in 04/2014.   Polysubstance abuse (HCC) 2011   Pyelonephritis 05/2012   Seizures (HCC)    last seizure was 04/2019 per patient    Significant Hospital Events: Including procedures, antibiotic start and stop dates in addition to other pertinent events   9/9 Admitted. PCCM consulted for possible ICU needs, given hypotension. Fortunately fluid responsive. AKI presumed prerenal in the setting of dehydration/UTI? BCx resulted +E.coli. Ceftriaxone. 9/11 PCCM called back for increased WOB, tachypnea to 30s-40s. CXR wet-appearing with +pulmonary edema.  Interim History/Subjective:  PCCM called back for increased WOB Tachypnea has been present since admission, worse today 30s-40s CXR appears wet, significantly more edema since 9/9 Fluids stopped Lasix, +/- albumin (defer to Nephro) Plt  pending, would diurese first  Objective:  Blood pressure 118/84, pulse (!) 123, temperature (!) 100.7 F (38.2 C), temperature source Rectal, resp. rate (!) 43, height 5\' 3"  (1.6 m), weight 52.4 kg, SpO2 95%.        Intake/Output Summary (Last 24 hours) at 10/16/2022 0900 Last data filed at 10/16/2022 0338 Gross per 24 hour  Intake 3591 ml  Output 200 ml  Net 3391 ml   Filed Weights   10/14/22 1912 10/15/22 0541 10/16/22 0532  Weight: 45.3 kg 46 kg 52.4 kg   Physical Examination: General: Chronically ill-appearing middle-aged woman in NAD. Agitated. HEENT: [place]/AT, anicteric sclera, PERRL, dry mucous membranes. Neuro: Awake, oriented x 1-2. Responds to verbal stimuli. Following commands consistently. Moves all 4 extremities spontaneously.   CV: Tachycardic to 120s, regular rhythm, no m/g/r. PULM: Breathing even, shallow/tachypneic to 30s, mildly labored on 2LNC. Lung fields with bibasilar crackles/posterior crackles. GI: Soft, nontender, nondistended. Normoactive bowel sounds. Extremities: Trace bilateral symmetric LE edema noted. Skin: Warm/dry, no rashes.  Assessment & Plan:  Respiratory distress Volume overload with pulmonary edema Tachypneic to 30s-40s 9/11. - CXR with increased pulmonary edema, volume overload - D/c fluids - Diurese with Lasix - Albumin if Nephrology wishes, would administer concentrated (25%) not 5% - Advise diuresis prior to Plt administration - Supplemental O2 support - Low threshold for BiPAP as needed, unsure if patient would tolerate this at present  Fever Leukocytosis - Goal MAP > 65 - Fluid resuscitation as tolerated - Not requiring pressors at present - Trend WBC, fever curve, LA (last 4.2 9/9) - F/u  Cx data - Continue ceftriaxone  AKI, likely prerenal in the setting of dehydration Hyponatremia AG/lactic acidosis - Trend BMP - Replete electrolytes as indicated - Monitor I&Os - F/u urine studies - Avoid nephrotoxic agents as able -  Ensure adequate renal perfusion  Chronic thrombocytopenia 2/2 EtOH and hepatic steatosis - Trend Plt - Monitor for signs of active bleeding - Transfuse for Hgb < 7.0, Plt < 20 or hemodynamically significant bleeding - Plt today, Lasix prior to further volume  EtOH abuse history - Monitor for signs/symptoms of withdrawal - CIWA protocol with Ativan - Continue thiamine - MV as able  Does not have ICU needs at this juncture; diurese and will reevaluate this afternoon.  Best practice (evaluated daily):  Per Primary Team  Critical care time: N/A   Faythe Ghee [facility] 10/16/22 9:01 AM  Please see [email or web address] for pager details.  From 7A-7P if no response, please call [phone] After hours, please call ELink [phone]

## 2022-10-16 NOTE — Progress Notes (Signed)
[facility] Progress Note   Subjective:   Did not tolerate PO potassium yesterday Received platelets yesterday Continues to have liquid BM Blood in urine seen by overnight RN suspected from patient becoming delirious and tugging at foley catheter Febrile overnight Patient is more lethargic this AM Denies chest pain, shortness of breath, or generalized pain this am, but continues to endorse urgency for BM  Objective Vital signs in last 24 hours: Vitals:   10/15/22 2338 10/16/22 0532 10/16/22 0804 10/16/22 0830  BP: 108/64 138/75 118/84   Pulse: (!) 105 (!) 116 (!) 120 (!) 123  Resp: (!) 27 (!) 36 (!) 37 (!) 43  Temp: 99.5 F (37.5 C) 99.1 F (37.3 C) (!) 101.3 F (38.5 C) (!) 100.7 F (38.2 C)  TempSrc: Axillary Axillary Axillary Rectal  SpO2: 100% 100% 99% 95%  Weight:  52.4 kg    Height:       Weight change: 12.6 kg  Intake/Output Summary (Last 24 hours) at 10/16/2022 0859 Last data filed at 10/16/2022 1610 Gross per 24 hour  Intake 3591 ml  Output 200 ml  Net 3391 ml    Assessment/ Plan: Pt is a 62 y.o. yo female who was admitted on 10/14/2022 with chronic history of alcohol use disorder c/b seizures, malnutrition, cirrhosis, chronic thrombocytopenia, HTN, MDD, admitted for sepsis secondary to E coli bacteremia c/b by severe AKI, acute on chronic anemia, and profound thrombocytopenia concerning for non overt DIC.    Assessment/Plan: Sepsis secondary to E coli bacteremia  Pulmonary edema -pyelonephritis vs potential bacterial gastroenteritis; this is a sick patient -Evidence of pyuria/bacteriuria on UA, bilateral perinephric stranding, small perinephric fluid collection, and dilatation of colon in setting of diarrheal illness on CT.  -No evidence of shock at this time. Persistently tachycardic; initially improved on continuous isotonic fluid administration, however, persistently tachypneic today despite closing of AG and now with new O2 requirement and  tachypnea since 8PM last night.  Net positive 3L. CXR  with evidence of pulmonary edema -Will stop continuous fluids now and order a high dose of furosemide in the setting of low albumin. Will hold off on albumin at this time as to prevent further fluid administration -Fever overnight and this AM. More somnolent today. Today is second day of CTX therapy; still awaiting susceptibilities and stool studies. Would consider broadening antibiotic therapy.  Severe AKI:  Suspect initially prerenal in setting of hypovolemia from poor PO intake, ongoing GI losses, sepsis, and consistent use of antihypertensive medications. Likely now progressed to ischemic ATN.  - Urine Na mildly elevated at this time; patient previously on hydrochlorothiazide. Other urine studies in process to calculate FeUrea. -Total UOP in 24h .  -Improvement in Cr from  4.43 ->4.0 on 9/10 to 3.52 today -Stopped  on isotonic fluid infusion in setting on pulmonary edema above. Will continue to monitor GFR and Cr -No acute need for HD at this time. Will monitor UOP, volume status, and metabolic derangements as this patient is at high risk for decompensation and HD requirement  AG metabolic acidosis : -Previously elevated lactate -AG closed at this time s/p 2L IVF boluses and 24 hr on maintenance isotonic fluids  Hypokalemia Hypomagnesemia Hypophosphatemia -s/p multiople runs of magnesium and K. Continues to have GI losses -4x 10mE K chloride ordered + potassium phosphate . Will likely need more this PM -Mag supplementation ordered -Will follow up RFP at 1200 for continued supplementation in setting of new furosemide requirement for  Normocytic anemia  DIC concern: -  Elevated LDH, elevated coag studies,elevated ddmer. However, elevated fibrinogen and normal IPF. No schistocytes on smear. Blood in foley likely traumatic.  -Stable Hgb at 7.2. Patient with high risk of progression to overt DIC.  -Hold of iron supplementation  in setting of bacteremia -Continue to monitor -Transfuse for Hgb <7  Hyponatremia:  -Stable at 134 -CTM  Thrombocytopenia -s/p one unit of platelets on 9/10. Blood in foley today. Primary team has ordered another platelet transfusion today Hepatic steatosisCirrhosis: No overt evidence of ascites on exam or imaging. LFTs stable.  EtOH use disorder  protein calorie malnutritionseizure disorder: Patient is likely withdrawing at this time, which is contributing to VS and clinical presentation. CIWA with ativan   Morene Crocker    Labs: Basic Metabolic Panel: Recent Labs  Lab 10/15/22 0540 10/15/22 1600 10/16/22 0600  NA 132* 134* 134*  K 2.1* 2.8* 2.6*  CL 105 97* 97*  CO2 14* 18* 24  GLUCOSE 73 188* 114*  BUN 65* 63* 58*  CREATININE 4.43* 4.00* 3.52*  CALCIUM 7.4* 7.6* 7.7*  PHOS 1.8* 3.4 1.5*   Liver Function Tests: Recent Labs  Lab 10/14/22 0909 10/15/22 0540 10/15/22 1600 10/16/22 0600  AST 66* 49*  --  63*  ALT 30 23  --  23  ALKPHOS 144* 84  --  108  BILITOT 3.9* 4.2*  --  4.2*  PROT 6.9 5.1*  --  4.6*  ALBUMIN 2.5* 1.8* 1.7* 1.6*   No results for input(s): "LIPASE", "AMYLASE" in the last 168 hours. No results for input(s): "AMMONIA" in the last 168 hours. CBC: Recent Labs  Lab 10/14/22 0909 10/15/22 0540 10/15/22 1153 10/15/22 2035 10/16/22 0600  WBC 17.2* 14.7*  --  12.2* 12.4*  NEUTROABS 15.1*  --   --   --  10.4*  HGB 10.2* 8.0*  --  7.2* 7.2*  HCT 28.4* 21.7*  --  19.3* 19.6*  MCV 95.0 91.6  --  90.6 88.7  PLT 18* 6* <5* 15* 8*   Cardiac Enzymes: Recent Labs  Lab 10/14/22 2134  CKTOTAL 541*   CBG: Recent Labs  Lab 10/15/22 2115 10/16/22 0513  GLUCAP 139* 123*    Iron Studies:  Recent Labs    10/15/22 0540  IRON 6*  TIBC 126*  FERRITIN 590*   Studies/Results: DG Swallowing Func-Speech Pathology  Result Date: 10/15/2022 Table formatting from the original result was not included. Modified Barium Swallow Study  Patient Details Name: JIAYI ELICK MRN: [id number] Date of Birth: 01-17-1961 Today's Date: 10/15/2022 HPI/PMH: HPI: Pt is a 62 y.o. female who presented with generalized aches and malaise x3-4 days. She noted nausea, emesis, and diarreha, but her significant other denied noting these symptoms. Increasing confusion noted from family. PMH is significant for etoh abuse, panreatitis, seizures, hypertension, and GIB. Clinical Impression: Clinical Impression: Patient presents with a moderate oral dysphagia and mild pharyngeal phase dysphagia as per this MBS. In addition, cognitive impairment impacted study, as patient was constantly moving her head around and required cues to reposition as well as to initiate swallow. Boluses of thin liquid, nectar thick liquids and puree solids were administered. Swallow was initiated at level of vallecular sinus. Epiglottic inversion was complete but anterior hyoid excursion and laryngeal elevation appeared partial in completion. Lingual control of bolus and tongue base retraction were both impaired, leading to poor bolus cohesion, reduced anterior to posterior oral transit of boluses. Mild amount of residuals observed in vallecular sinus, aryepiglottic folds, posterior pharyngeal wall and pyriform sinus  with liquid boluses and moderate amount of vallecular residuals with puree solids were observed. Residuals. started to clear with subsequent swallows. Appearance of narrowing of space at PES observed which resulted in mild amount of barium remaining above PES after initial swallows before clearing during subsequent swallows. During esophageal sweep, appearance of decreased bolus cohesion and slow transit of puree solid barium observed. No aspiration or penetration seen. SLP recommending to continue with full liquids (thin) diet and will follow for toleration and ability to advance. Factors that may increase risk of adverse event in presence of aspiration Rubye Oaks & Clearance Coots 2021):  Factors that may increase risk of adverse event in presence of aspiration Rubye Oaks & Clearance Coots 2021): Frail or deconditioned; Reduced cognitive function; Weak cough; Poor general health and/or compromised immunity Recommendations/Plan: Swallowing Evaluation Recommendations Swallowing Evaluation Recommendations Recommendations: PO diet PO Diet Recommendation: Full liquid diet; Thin liquids (Level 0) Liquid Administration via: Cup; Straw Medication Administration: Crushed with puree Supervision: Full supervision/cueing for swallowing strategies; Staff to assist with self-feeding; Patient able to self-feed Swallowing strategies  : Slow rate; Small bites/sips; Minimize environmental distractions Postural changes: Position pt fully upright for meals; Stay upright 30-60 min after meals Oral care recommendations: Oral care BID (2x/day) Treatment Plan Treatment Plan Treatment recommendations: Therapy as outlined in treatment plan below Follow-up recommendations: No SLP follow up Functional status assessment: Patient has had a recent decline in their functional status and demonstrates the ability to make significant improvements in function in a reasonable and predictable amount of time. Treatment frequency: Min 2x/week Treatment duration: 1 week Interventions: Aspiration precaution training; Trials of upgraded texture/liquids; Diet toleration management by SLP; Patient/family education Recommendations Recommendations for follow up therapy are one component of a multi-disciplinary discharge planning process, led by the attending physician.  Recommendations may be updated based on patient status, additional functional criteria and insurance authorization. Assessment: Orofacial Exam: Orofacial Exam Oral Cavity - Dentition: Adequate natural dentition Anatomy: Anatomy: WFL Boluses Administered: Boluses Administered Boluses Administered: Thin liquids (Level 0); Mildly thick liquids (Level 2, nectar thick); Puree  Oral Impairment  Domain: Oral Impairment Domain Lip Closure: No labial escape Tongue control during bolus hold: Not tested Bolus preparation/mastication: Slow prolonged chewing/mashing with complete recollection Bolus transport/lingual motion: Slow tongue motion Oral residue: Residue collection on oral structures Location of oral residue : Tongue; Palate Initiation of pharyngeal swallow : Valleculae  Pharyngeal Impairment Domain: Pharyngeal Impairment Domain Soft palate elevation: No bolus between soft palate (SP)/pharyngeal wall (PW) Laryngeal elevation: Complete superior movement of thyroid cartilage with complete approximation of arytenoids to epiglottic petiole Anterior hyoid excursion: Partial anterior movement Epiglottic movement: Complete inversion Laryngeal vestibule closure: Incomplete, narrow column air/contrast in laryngeal vestibule Pharyngeal stripping wave : Present - diminished Pharyngeal contraction (A/P view only): N/A Pharyngoesophageal segment opening: Partial distention/partial duration, partial obstruction of flow Tongue base retraction: Narrow column of contrast or air between tongue base and PPW Pharyngeal residue: Collection of residue within or on pharyngeal structures Location of pharyngeal residue: Valleculae; Aryepiglottic folds; Pyriform sinuses; Diffuse (>3 areas); Pharyngeal wall; Tongue base  Esophageal Impairment Domain: Esophageal Impairment Domain Esophageal clearance upright position: Complete clearance, esophageal coating Pill: No data recorded Penetration/Aspiration Scale Score: Penetration/Aspiration Scale Score 1.  Material does not enter airway: Thin liquids (Level 0); Mildly thick liquids (Level 2, nectar thick); Puree Compensatory Strategies: Compensatory Strategies Compensatory strategies: Yes Straw: Effective Effective Straw: Thin liquid (Level 0)   General Information: Caregiver present: No  Diet Prior to this Study: Full liquid diet; Thin liquids (Level  0)   Temperature : Normal    Respiratory Status: WFL   Supplemental O2: None (Room air)   History of Recent Intubation: No  Behavior/Cognition: Alert; Cooperative; Confused; Requires cueing; Distractible Self-Feeding Abilities: Able to self-feed; Needs set-up for self-feeding Baseline vocal quality/speech: Normal Volitional Cough: Unable to elicit Volitional Swallow: Able to elicit Exam Limitations: Excessive movement Goal Planning: Prognosis for improved oropharyngeal function: Good Barriers to Reach Goals: Cognitive deficits No data recorded Patient/Family Stated Goal: None stated Consulted and agree with results and recommendations: Pt unable/family or caregiver not available Pain: Pain Assessment Pain Assessment: No/denies pain End of Session: Start Time:SLP Start Time (ACUTE ONLY): 1400 Stop Time: SLP Stop Time (ACUTE ONLY): 1415 Time Calculation:SLP Time Calculation (min) (ACUTE ONLY): 15 min Charges: SLP Evaluations $ SLP Speech Visit: 1 Visit SLP Evaluations $BSS Swallow: 1 Procedure $MBS Swallow: 1 Procedure SLP visit diagnosis: SLP Visit Diagnosis: Dysphagia, oropharyngeal phase (R13.12) Past Medical History: Past Medical History: Diagnosis Date  Alcoholism (HCC) 2011  Asthma   Chronic lower back pain   Colitis 04/2014.  Bloody diarrhea  Daily headache   GIB (gastrointestinal bleeding)   Hypertension   Pancreatitis   Chronic pancreatitis noted on CT scan in 04/2014.  Polysubstance abuse (HCC) 2011  Pyelonephritis 05/2012  Seizures (HCC)   last seizure was 04/2019 per patient  Past Surgical History: Past Surgical History: Procedure Laterality Date  COLONOSCOPY WITH PROPOFOL N/A 09/09/2019  Procedure: COLONOSCOPY WITH PROPOFOL;  Surgeon: Charlott Rakes, MD;  Location: [facility] ENDOSCOPY;  Service: Endoscopy;  Laterality: N/A;  ESOPHAGOGASTRODUODENOSCOPY N/A 05/31/2015  Procedure: ESOPHAGOGASTRODUODENOSCOPY (EGD);  Surgeon: Hilarie Fredrickson, MD;  Location: [facility] ENDOSCOPY;  Service: Endoscopy;  Laterality: N/A;  ESOPHAGOGASTRODUODENOSCOPY N/A  09/09/2019  Procedure: ESOPHAGOGASTRODUODENOSCOPY (EGD);  Surgeon: Charlott Rakes, MD;  Location[facility] ENDOSCOPY;  Service: Endoscopy;  Laterality: N/A;  ORIF HUMERUS FRACTURE Right 05/12/2019  Procedure: OPEN REDUCTION INTERNAL FIXATION (ORIF) DISTAL HUMERUS FRACTURE;  Surgeon: Bjorn Pippin, MD;  Location: [facility] ORS;  Service: Orthopedics;  Laterality: Right; Angela Nevin, MA, CCC-SLP Speech Therapy   CT ABDOMEN PELVIS WO CONTRAST  Result Date: 10/14/2022 CLINICAL DATA:  Sepsis weakness diarrhea EXAM: CT ABDOMEN AND PELVIS WITHOUT CONTRAST TECHNIQUE: Multidetector CT imaging of the abdomen and pelvis was performed following the standard protocol without IV contrast. RADIATION DOSE REDUCTION: This exam was performed according to the departmental dose-optimization program which includes automated exposure control, adjustment of the mA and/or kV according to patient size and/or use of iterative reconstruction technique. COMPARISON:  CT 06/30/2022, 10/12/2020 FINDINGS: Lower chest: Lung bases demonstrate dependent atelectasis. Hepatobiliary: Hepatic steatosis. Suspicion of subtle surface nodularity of the liver as may be seen with cirrhosis. No calcified gallstone or biliary dilatation Pancreas: Atrophic pancreas without acute inflammation. Scattered calcifications at the proximal pancreas probably due to chronic pancreatitis. Spleen: Normal in size without focal abnormality. Adrenals/Urinary Tract: Negative for adrenal nodule. Interval finding of considerable bilateral perinephric stranding and small volume perinephric fluid. No hydronephrosis. Small nonobstructing stones lower pole left kidney. Urinary bladder is decompressed by Foley catheter Stomach/Bowel: Stomach nonenlarged. Fluid-filled borderline distended pelvic small bowel loops. Moderate air and fluid distension of the colon and rectum. 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 Curved appearance of the bowel in the left lower quadrant,  series 3, image 53 but without convincing obstruction related to this finding. Vascular/Lymphatic: Advanced aortic atherosclerosis. No aneurysm. No suspicious lymph nodes. Reproductive: Uterus and adnexa are unremarkable Other: Negative for pelvic effusion or free air Musculoskeletal: No acute  or suspicious osseous abnormality. IMPRESSION: 1. Interval finding of considerable bilateral perinephric stranding and small volume perinephric fluid. No hydronephrosis. Findings are nonspecific and could be related to pyelonephritis or other medical renal disease. 2. Moderate air and fluid distension of the colon and rectum consistent with diarrheal illness. 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 3. Hepatic steatosis. Suspicion of subtle surface nodularity of the liver as may be seen with cirrhosis. 4. Aortic atherosclerosis. Aortic Atherosclerosis (ICD10-I70.0). Electronically Signed   By: Jasmine Pang M.D.   On: 10/14/2022 21:23   CT HEAD WO CONTRAST ( )  Result Date: 10/14/2022 CLINICAL DATA:  Generalized weakness dysarthria started 4 days ago according to epic notes unable to walk EXAM: CT HEAD WITHOUT CONTRAST TECHNIQUE: Contiguous axial images were obtained from the base of the skull through the vertex without intravenous contrast. RADIATION DOSE REDUCTION: This exam was performed according to the departmental dose-optimization program which includes automated exposure control, adjustment of the mA and/or kV according to patient size and/or use of iterative reconstruction technique. COMPARISON:  CT brain 07/25/2021 FINDINGS: Brain: No acute territorial infarction, hemorrhage or intracranial mass. Chronic left parietal infarct. Mild atrophy. Moderate white matter hypodensity consistent with chronic small vessel ischemic change. The ventricles are nonenlarged Vascular: No hyperdense vessels.  Carotid vascular calcification Skull: Normal. Negative for fracture or focal lesion. Sinuses/Orbits: No  acute finding. Other: None IMPRESSION: 1. No definite CT evidence for acute intracranial abnormality. 2. Atrophy and chronic small vessel ischemic changes of the white matter. Chronic left parietal infarct. Electronically Signed   By: Jasmine Pang M.D.   On: 10/14/2022 21:11   US Abdomen Limited RUQ (LIVER/GB)  Result Date: 10/14/2022 CLINICAL DATA:  Elevated liver enzymes EXAM: ULTRASOUND ABDOMEN LIMITED RIGHT UPPER QUADRANT COMPARISON:  None Available. FINDINGS: Gallbladder: No gallstones or wall thickening visualized. No sonographic Murphy sign noted by sonographer. Common bile duct: Diameter: 3.3 mm Liver: Coarsened slightly echogenic. No focal hepatic abnormality. Portal vein is patent on color Doppler imaging with normal direction of blood flow towards the liver. Other: None. IMPRESSION: Coarsened slightly echogenic liver consistent with hepatic steatosis and or hepatocellular disease. Negative for gallstones Electronically Signed   By: Jasmine Pang M.D.   On: 10/14/2022 20:58   DG Chest Port 1 View  Result Date: 10/14/2022 CLINICAL DATA:  Possible sepsis. Generalized body ache. Feeling sick over the last 3-4 days. Mental status changes and confusion. Sinus tachycardia. EXAM: PORTABLE CHEST 1 VIEW COMPARISON:  02/05/2022 FINDINGS: Heart size is normal. Mediastinal shadows are normal. Artifact overlies the chest. The patient has taken a poor inspiration. Allowing for that, the lungs are probably clear. No visible pleural fluid. No abnormal bone finding. IMPRESSION: Poor inspiration. Allowing for that, no active disease suspected. Electronically Signed   By: Paulina Fusi M.D.   On: 10/14/2022 11:26   [place] EKG SITE RITE  Result Date: 10/14/2022 If Site Rite image not attached, placement could not be confirmed due to current cardiac rhythm.  Medications: Infusions:  cefTRIAXone (ROCEPHIN)  IV Stopped (10/15/22 1000)   magnesium sulfate bolus IVPB 2 g (10/16/22 0845)   potassium chloride 10 mEq  (10/16/22 0843)   potassium PHOSPHATE IVPB (in mmol)     sodium bicarbonate 150 mEq in dextrose 5 % 1,150 mL infusion 125 mL/hr at 10/16/22 0338   thiamine (VITAMIN B1) injection 500 mg (10/16/22 0602)   Followed by   Melene Muller ON 10/17/2022] thiamine (VITAMIN B1) injection      Scheduled Medications:  sodium  chloride   Intravenous Once   Chlorhexidine Gluconate Cloth  6 each Topical Q0600   folic acid  1 mg Oral Daily   multivitamin with minerals  1 tablet Oral Daily   [START ON 10/22/2022] thiamine  100 mg Oral Daily   I have reviewed scheduled and prn medications.  Physical Exam: General: Lethargic woman resting in bed in NAD HEENT: dry mucus membranes Heart: Tachycardic Lungs: Increased work of breathing, speaking in short sentences. Now with diminished respiratory effort and crackles on bilateral lung bases Abdomen: Non tender, non distended,. NO shifting dullness today. Hyperactive bowel sounds Extremities:  Warm and dry. Bilateral non pitting edema now in  lower extremities. L arm subcutaneous nodule, non tender and without discoloration suspicious for lipoma Neuro: Somnolent, dysarthric. Following directions intermittently. Tremulous hands.  GU: Foley catheter present, small volume of gross hematuria in foley Skin: no rashes, petechiae, or ecchymoses noted   10/16/2022,8:59 AM  LOS: 2 days

## 2022-10-16 NOTE — Significant Event (Addendum)
Pt stool positive for STEC.  Question STEC-HUS?  Tried paging heme/onc: No call back from heme/onc.  Messaged Dr. Glenna Fellows: no TPE tonight.  May just need supportive care anyhow.  She will discuss with heme/onc in AM.

## 2022-10-16 NOTE — Social Work (Signed)
CSW acknowledges consult for substance use counseling and resources. Pt is currently not appropriate for this assessment. TOC will continue to follow.

## 2022-10-16 NOTE — Progress Notes (Signed)
Patient is trying to climb out of bed, wraps legs around foley tubing to try and remove it. Have reoriented multiple times with no success. Have been 1:1 with patient all day.

## 2022-10-16 NOTE — Progress Notes (Signed)
SLP Cancellation Note  Patient Details Name: Brandy Bishop MRN: [id number] DOB: [birth year]   Cancelled treatment:       Reason Eval/Treat Not Completed: Patient's level of consciousness;Medical issues which prohibited therapy. Per RN, patient lethargic, increased WOB, tachypnea and therefore they are holding PO's at this time. SLP will continue to follow.  Angela Nevin, MA, CCC-SLP Speech Therapy

## 2022-10-16 NOTE — Progress Notes (Signed)
Date and time results received: 10/16/22 22:12  Test: GI PCR; Platelet Critical Value: GI( STEC detected); Platelet=9  Name of Provider Notified: Dr Julian Reil  Orders Received? Or Actions Taken?: Yes

## 2022-10-17 ENCOUNTER — Other Ambulatory Visit: Payer: Self-pay

## 2022-10-17 ENCOUNTER — Inpatient Hospital Stay (HOSPITAL_COMMUNITY): Payer: MEDICAID

## 2022-10-17 DIAGNOSIS — R6520 Severe sepsis without septic shock: Secondary | ICD-10-CM | POA: Diagnosis not present

## 2022-10-17 DIAGNOSIS — A419 Sepsis, unspecified organism: Secondary | ICD-10-CM | POA: Diagnosis not present

## 2022-10-17 DIAGNOSIS — A4151 Sepsis due to Escherichia coli [E. coli]: Secondary | ICD-10-CM

## 2022-10-17 DIAGNOSIS — G9341 Metabolic encephalopathy: Secondary | ICD-10-CM

## 2022-10-17 LAB — BPAM PLATELET PHERESIS
Blood Product Expiration Date: 202409122359
ISSUE DATE / TIME: 202409111347
Unit Type and Rh: 6200

## 2022-10-17 LAB — DIC (DISSEMINATED INTRAVASCULAR COAGULATION)PANEL
D-Dimer, Quant: 13.95 ug{FEU}/mL — ABNORMAL HIGH (ref 0.00–0.50)
Fibrinogen: 474 mg/dL (ref 210–475)
INR: 1.4 — ABNORMAL HIGH (ref 0.8–1.2)
Platelets: 21 10*3/uL — CL (ref 150–400)
Prothrombin Time: 17.1 s — ABNORMAL HIGH (ref 11.4–15.2)
Smear Review: NONE SEEN
aPTT: 35 s (ref 24–36)

## 2022-10-17 LAB — BLOOD GAS, VENOUS
Acid-Base Excess: 3.6 mmol/L — ABNORMAL HIGH (ref 0.0–2.0)
Bicarbonate: 30.1 mmol/L — ABNORMAL HIGH (ref 20.0–28.0)
O2 Saturation: 89.7 %
Patient temperature: 37
pCO2, Ven: 52 mmHg (ref 44–60)
pH, Ven: 7.37 (ref 7.25–7.43)
pO2, Ven: 57 mmHg — ABNORMAL HIGH (ref 32–45)

## 2022-10-17 LAB — MAGNESIUM
Magnesium: 2.4 mg/dL (ref 1.7–2.4)
Magnesium: 2.1 mg/dL (ref 1.7–2.4)

## 2022-10-17 LAB — COMPREHENSIVE METABOLIC PANEL
ALT: 26 U/L (ref 0–44)
AST: 62 U/L — ABNORMAL HIGH (ref 15–41)
Albumin: 1.5 g/dL — ABNORMAL LOW (ref 3.5–5.0)
Alkaline Phosphatase: 123 U/L (ref 38–126)
Anion gap: 14 (ref 5–15)
BUN: 72 mg/dL — ABNORMAL HIGH (ref 8–23)
CO2: 28 mmol/L (ref 22–32)
Calcium: 8.1 mg/dL — ABNORMAL LOW (ref 8.9–10.3)
Chloride: 96 mmol/L — ABNORMAL LOW (ref 98–111)
Creatinine, Ser: 3.57 mg/dL — ABNORMAL HIGH (ref 0.44–1.00)
GFR, Estimated: 14 mL/min — ABNORMAL LOW (ref >60)
Glucose, Bld: 72 mg/dL (ref 70–99)
Potassium: 3.5 mmol/L (ref 3.5–5.1)
Sodium: 138 mmol/L (ref 135–145)
Total Bilirubin: 4.3 mg/dL — ABNORMAL HIGH (ref 0.3–1.2)
Total Protein: 4.7 g/dL — ABNORMAL LOW (ref 6.5–8.1)

## 2022-10-17 LAB — PHOSPHORUS
Phosphorus: 4.1 mg/dL (ref 2.5–4.6)
Phosphorus: 4.6 mg/dL (ref 2.5–4.6)

## 2022-10-17 LAB — CBC
HCT: 21.3 % — ABNORMAL LOW (ref 36.0–46.0)
HCT: 19.8 % — ABNORMAL LOW (ref 36.0–46.0)
Hemoglobin: 7.8 g/dL — ABNORMAL LOW (ref 12.0–15.0)
Hemoglobin: 7 g/dL — ABNORMAL LOW (ref 12.0–15.0)
MCH: 33.5 pg (ref 26.0–34.0)
MCH: 32.3 pg (ref 26.0–34.0)
MCHC: 36.6 g/dL — ABNORMAL HIGH (ref 30.0–36.0)
MCHC: 35.4 g/dL (ref 30.0–36.0)
MCV: 91.4 fL (ref 80.0–100.0)
MCV: 91.2 fL (ref 80.0–100.0)
Platelets: 6 10*3/uL — CL (ref 150–400)
Platelets: 22 10*3/uL — CL (ref 150–400)
RBC: 2.33 MIL/uL — ABNORMAL LOW (ref 3.87–5.11)
RBC: 2.17 MIL/uL — ABNORMAL LOW (ref 3.87–5.11)
RDW: 15 % (ref 11.5–15.5)
RDW: 15.5 % (ref 11.5–15.5)
WBC: 15.7 10*3/uL — ABNORMAL HIGH (ref 4.0–10.5)
WBC: 16.3 10*3/uL — ABNORMAL HIGH (ref 4.0–10.5)
nRBC: 0 % (ref 0.0–0.2)
nRBC: 0 % (ref 0.0–0.2)

## 2022-10-17 LAB — RENAL FUNCTION PANEL
Albumin: 1.5 g/dL — ABNORMAL LOW (ref 3.5–5.0)
Anion gap: 12 (ref 5–15)
BUN: 69 mg/dL — ABNORMAL HIGH (ref 8–23)
CO2: 26 mmol/L (ref 22–32)
Calcium: 8.1 mg/dL — ABNORMAL LOW (ref 8.9–10.3)
Chloride: 97 mmol/L — ABNORMAL LOW (ref 98–111)
Creatinine, Ser: 3.48 mg/dL — ABNORMAL HIGH (ref 0.44–1.00)
GFR, Estimated: 14 mL/min — ABNORMAL LOW (ref >60)
Glucose, Bld: 70 mg/dL (ref 70–99)
Phosphorus: 4.6 mg/dL (ref 2.5–4.6)
Potassium: 3.6 mmol/L (ref 3.5–5.1)
Sodium: 135 mmol/L (ref 135–145)

## 2022-10-17 LAB — BASIC METABOLIC PANEL
Anion gap: 16 — ABNORMAL HIGH (ref 5–15)
BUN: 63 mg/dL — ABNORMAL HIGH (ref 8–23)
CO2: 27 mmol/L (ref 22–32)
Calcium: 8.2 mg/dL — ABNORMAL LOW (ref 8.9–10.3)
Chloride: 95 mmol/L — ABNORMAL LOW (ref 98–111)
Creatinine, Ser: 3.4 mg/dL — ABNORMAL HIGH (ref 0.44–1.00)
GFR, Estimated: 15 mL/min — ABNORMAL LOW (ref >60)
Glucose, Bld: 80 mg/dL (ref 70–99)
Potassium: 3.3 mmol/L — ABNORMAL LOW (ref 3.5–5.1)
Sodium: 138 mmol/L (ref 135–145)

## 2022-10-17 LAB — PREPARE PLATELET PHERESIS: Unit division: 0

## 2022-10-17 LAB — BLOOD GAS, ARTERIAL
Acid-Base Excess: 8 mmol/L — ABNORMAL HIGH (ref 0.0–2.0)
Bicarbonate: 32 mmol/L — ABNORMAL HIGH (ref 20.0–28.0)
O2 Saturation: 100 %
Patient temperature: 36.3
pCO2 arterial: 40 mmHg (ref 32–48)
pH, Arterial: 7.51 — ABNORMAL HIGH (ref 7.35–7.45)
pO2, Arterial: 83 mmHg (ref 83–108)

## 2022-10-17 LAB — POCT I-STAT 7, (LYTES, BLD GAS, ICA,H+H)
Acid-Base Excess: 3 mmol/L — ABNORMAL HIGH (ref 0.0–2.0)
Bicarbonate: 27.9 mmol/L (ref 20.0–28.0)
Calcium, Ion: 1.11 mmol/L — ABNORMAL LOW (ref 1.15–1.40)
HCT: 38 % (ref 36.0–46.0)
Hemoglobin: 12.9 g/dL (ref 12.0–15.0)
O2 Saturation: 88 %
Patient temperature: 97.3
Potassium: 3.6 mmol/L (ref 3.5–5.1)
Sodium: 138 mmol/L (ref 135–145)
TCO2: 29 mmol/L (ref 22–32)
pCO2 arterial: 40.8 mmHg (ref 32–48)
pH, Arterial: 7.44 (ref 7.35–7.45)
pO2, Arterial: 51 mmHg — ABNORMAL LOW (ref 83–108)

## 2022-10-17 LAB — LACTIC ACID, PLASMA
Lactic Acid, Venous: 1.4 mmol/L (ref 0.5–1.9)
Lactic Acid, Venous: 1.2 mmol/L (ref 0.5–1.9)
Lactic Acid, Venous: 1.3 mmol/L (ref 0.5–1.9)

## 2022-10-17 LAB — CULTURE, BLOOD (ROUTINE X 2)

## 2022-10-17 LAB — GLUCOSE, CAPILLARY
Glucose-Capillary: 144 mg/dL — ABNORMAL HIGH (ref 70–99)
Glucose-Capillary: 48 mg/dL — ABNORMAL LOW (ref 70–99)
Glucose-Capillary: 119 mg/dL — ABNORMAL HIGH (ref 70–99)

## 2022-10-17 LAB — CG4 I-STAT (LACTIC ACID): Lactic Acid, Venous: 1.2 mmol/L (ref 0.5–1.9)

## 2022-10-17 MED ORDER — DEXTROSE 50 % IV SOLN: 1.0000 | Freq: Once | INTRAVENOUS | Status: AC

## 2022-10-17 MED ORDER — FUROSEMIDE 10 MG/ML IJ SOLN
120.0000 mg | Freq: Once | INTRAVENOUS | Status: AC
  Administered 2022-10-17: 120 mg via INTRAVENOUS
  Filled 2022-10-17: qty 2

## 2022-10-17 MED ORDER — SODIUM CHLORIDE 0.9% IV SOLUTION: Freq: Once | INTRAVENOUS | Status: AC

## 2022-10-17 MED ORDER — DEXTROSE 50 % IV SOLN
INTRAVENOUS | Status: AC
  Administered 2022-10-17: 50 mL via INTRAVENOUS
  Filled 2022-10-17: qty 50

## 2022-10-17 MED ORDER — POTASSIUM CHLORIDE 10 MEQ/100ML IV SOLN
10.0000 meq | INTRAVENOUS | Status: AC
  Administered 2022-10-17 (×4): 10 meq via INTRAVENOUS
  Filled 2022-10-17 (×4): qty 100

## 2022-10-17 NOTE — Progress Notes (Signed)
Patient transported to 3M12 via bed with RN & rapid response.  Report given to [facility] RN.  Care assumed by ICU.

## 2022-10-17 NOTE — Progress Notes (Signed)
SLP Cancellation Note  Patient Details Name: Brandy Bishop MRN: [id number] DOB: Mar 06, 1960   Cancelled treatment:       Reason Eval/Treat Not Completed: Medical issues which prohibited therapy;Patient's level of consciousness. Patient observed in bed with shallow more rapid breathing, RR has been in 30-40's, temperature at 99.2, HR 103. SLP to follow for readiness.    Angela Nevin, MA, CCC-SLP Speech Therapy

## 2022-10-17 NOTE — Progress Notes (Signed)
RRT called d/t tachypnea. Per RN,TRH MD was consulting PCCM. On RRT arrival, PCCM and [facility] MDs at bedside. Orders to tx pt to ICU. Pt transferred to 3M12 with 2C RN and 2C NT.

## 2022-10-17 NOTE — Progress Notes (Signed)
NAME:  Brandy Bishop, MRN:  [id number], DOB:  09-27-1960, LOS: 3 ADMISSION DATE:  10/14/2022, CONSULTATION DATE:  10/14/2022 REFERRING MD:  Jeraldine Loots - EDP, CHIEF COMPLAINT:  Weakness, dehydration   History of Present Illness:  Brandy Bishop is a 62 y.o. female who has a PMH as below. She presented to [facility] ED 9/9 with weakness and aches. She has had decreased PO intake x 2 - 3 days due to no appetite and weakness. She denies any recent fevers/chills/sweats, chest pain, dyspnea, abd pain, N/V/D, abd pain, myalgias.  She was found to have new AKI, hypokalemia, hyponatremia, elevated lactate, AGMA. She was also significantly dehydrated on ED presentation.  BP remained stable throughout. PCCM asked to see in consultation given AKI, lactic acidosis.  PCCM reconsulted 9/11 for increased WOB, tachypnea new O2 requirement.  Pertinent Medical History:   Past Medical History:  Diagnosis Date   Alcoholism (HCC) 2011   Asthma    Chronic lower back pain    Colitis 04/2014.   Bloody diarrhea   Daily headache    GIB (gastrointestinal bleeding)    Hypertension    Pancreatitis    Chronic pancreatitis noted on CT scan in 04/2014.   Polysubstance abuse (HCC) 2011   Pyelonephritis 05/2012   Seizures (HCC)    last seizure was 04/2019 per patient    Significant Hospital Events: Including procedures, antibiotic start and stop dates in addition to other pertinent events   9/9 Admitted. PCCM consulted for possible ICU needs, given hypotension. Fortunately fluid responsive. AKI presumed prerenal in the setting of dehydration/UTI? BCx resulted +E.coli. Ceftriaxone. 9/11 PCCM called back for increased WOB, tachypnea to 30s-40s. CXR wet-appearing with +pulmonary edema.  Interim History/Subjective:   Lasix given Continues to be somnolent Stool test was positive for STEC.    Objective:  Blood pressure 120/66, pulse 96, temperature 98.2 F (36.8 C), resp. rate (!) 43, height 5\' 3"  (1.6 m), weight 49.5  kg, SpO2 100%.        Intake/Output Summary (Last 24 hours) at 10/17/2022 1456 Last data filed at 10/17/2022 1100 Gross per 24 hour  Intake 1301.13 ml  Output 2950 ml  Net -1648.87 ml   Filed Weights   10/15/22 0541 10/16/22 0532 10/17/22 0500  Weight: 46 kg 52.4 kg 49.5 kg   Physical Examination: Gen:   Chronically ill-appearing HEENT:  EOMI, sclera anicteric Neck:     No masses; no thyromegaly Lungs:    Tachypneic to the 30s and 40s.  Scattered crackles CV:         Regular rate and rhythm; no murmurs Abd:      + bowel sounds; soft, non-tender; no palpable masses, no distension Ext:    No edema; adequate peripheral perfusion Skin:      Warm and dry; no rash Neuro: Sedated   Assessment & Plan:  Respiratory distress Volume overload with pulmonary edema Diuresed overnight Fluids have been discontinued Can repeat dose of Lasix Wean down oxygen as tolerated Low threshold for BiPAP as needed, unsure if patient would tolerate this at present  Fever Leukocytosis - Goal MAP > 65 - Fluid resuscitation as tolerated - Not requiring pressors at present - Trend WBC, fever curve, LA (last 4.2 9/9) - F/u Cx data - Continue ceftriaxone  AKI, likely prerenal in the setting of dehydration Hyponatremia AG/lactic acidosis Renal is following, no need for dialysis Possible HUS syndrome due to STEC from stool studies  Chronic thrombocytopenia 2/2 EtOH and hepatic steatosis Follow platelets,  transfuse as needed  EtOH abuse history - Monitor for signs/symptoms of withdrawal - CIWA protocol with Ativan - Continue thiamine - MV as able  Best practice (evaluated daily):  Per Primary Team  Critical care time: N/A   Chilton Greathouse MD [facility] See Amion for pager  If no response to pager , please call [phone] until 7pm After 7:00 pm call Elink  [phone] 10/17/2022, 2:56 PM

## 2022-10-17 NOTE — Progress Notes (Signed)
      10/17/22 2000  Assess: MEWS Score  BP 139/83  MAP (mmHg) 100  Pulse Rate (!) 106  ECG Heart Rate (!) 106  Resp (!) 47  SpO2 100 %  Assess: MEWS Score  MEWS Temp 0  MEWS Systolic 0  MEWS Pulse 1  MEWS RR 3  MEWS LOC 1  MEWS Score 5  MEWS Score Color Red  Assess: SIRS CRITERIA  SIRS Temperature  0  SIRS Pulse 1  SIRS Respirations  1  SIRS WBC 0  SIRS Score Sum  2

## 2022-10-17 NOTE — Progress Notes (Signed)
eLink Physician-Brief Progress Note Patient Name: Brandy Bishop DOB: May 19, 1960 MRN: [id number]   Date of Service  10/17/2022  HPI/Events of Note  Patient transferred to the ICU for shortness of breath, in the context STEC enteritis and bacteremia, patient also has a history of ETOH abuse and is on the CIWA protocol, other issues include AKI and thrombocytopenia.  eICU Interventions  New Patient Evaluation.        Migdalia Dk 10/17/2022, 11:39 PM

## 2022-10-17 NOTE — Progress Notes (Signed)
Patient respirations 40-50's, oxygen sat 100% on 3L [place], use of accessory muscles noted.  Dr. Julian Reil called and at bedside.  Rapid response called and at bedside.  CCM at bedside.

## 2022-10-17 NOTE — Progress Notes (Addendum)
PCCM INTERVAL PROGRESS NOTE   Called to bedside to evaluate Brandy Bishop for respiratory distress.  Briefly this 62 year old female with past medical history significant for asthma, alcoholism, GI bleed, substance abuse, and chronic thrombocytopenia.  She was admitted on 9/9 with weakness and aches and poor p.o. intake.  She was admitted with sepsis secondary to presumed urinary source.  She was treated with ceftriaxone.  She was initially dehydrated as well and treated with IV fluid resuscitation.  PCCM was consulted on 9/11 for respiratory distress and the patient was felt to be volume overloaded treated with diuretics and responded well.  Since that time the patient was found to be bacteremic with E. coli and stool PCR was positive for Shiga like toxin producing E. coli.  PCCM being called back on 9/12 for now with respiratory distress.  Patient's respiratory rate is reportedly in the 40s and 50s.  Upon my arrival to the bedside, the patient is drowsy arousing minimally to verbal stimuli and more-so to tactile stimuli. At no point did she participate in the exam, but she did get angry with me for nudging her. Notably she has had a total of 6mg  of ativan today for CIWA. Most recent dose 1800. She is cachectic appearing. Respirations are rapid and shallow. Lung sounds are clear. She has sinus tachycardia in the low 100s.  Respiratory insufficiency: etiology unclear. CXR from earlier today without clear cause. Has been responding to diuresis.  - Transfer to ICU - Holding IVF - Check ABG and repeat CXR - Not a candidate for BiPAP due to mental status - Low threshold for intubation  Sepsis secondary to E. Coli bacteremia. Pan sensitive - STEC - Continue ceftriaxone - Close hemodynamic monitoring in ICU - Contact precautions  AKI: Creatinine improved from admission, UOP adequate.  - Trend BMP - Nephrology following. - Had responded well to diuresis. Re-assess daily  Thrombocytopenia STEC HUS?:  Consideration. No schistocytes. Low grade fevers. AKI, AMS - Heme/Onc following - Nephrology following - Consider PLEX - Hold further transfusions - Resend DIC panel, CBC  Acute metabolic encephalopathy: ETOH wd, Benzo administration? Sepsis? HUS? - Close monitoring in ICU - May need airway protection - Resend ABG, chemistry   ETOH Probable cirrhosis - ciwa - high dose thiamine - folic acid  Critical care time 47 minutes  Joneen Roach, AGACNP-BC [facility]  See Amion for personal pager PCCM on call pager [phone] until 7pm. Please call Elink 7p-7a. [phone]  10/17/2022 8:47 PM    STAFF MD NOTe  S: seen with APP  Objective Very frail Brandy Bishop muscle wasting Intermittently tachypenic Mildly paradoxica Arousable but cnfused   A Encephalopathy Asthenia Resp distress  Plan  - move to ICU - ccm primary - check labs - low threshold for intubatiion      SIGNATURE    Dr. Kalman Shan, M.D., F.C.C.P,  Pulmonary and Critical Care Medicine Staff Physician, [facility] Director - Interstitial Lung Disease  Program  Pulmonary Fibrosis [facility] at [facility] [place], [place], [address]   Pager: [phone], If no answer  -> Check AMION or Try [phone] Telephone (clinical office): [phone] Telephone (research): [phone]  12:35 AM 10/18/2022

## 2022-10-17 NOTE — Plan of Care (Signed)
  Problem: Elimination: Goal: Will not experience complications related to urinary retention Outcome: Progressing   Problem: Safety: Goal: Ability to remain free from injury will improve Outcome: Progressing   

## 2022-10-17 NOTE — Significant Event (Addendum)
Pt seen and evaluated at bedside, RR 40-60s.  Has ongoing AMS.  Repeat ABG and DIC pnl ordered.  Updated PCCM who also evaluated at bedside.  PCCM transferring pt to ICU.

## 2022-10-17 NOTE — Progress Notes (Signed)
Abg drawn from left radial and resulted.

## 2022-10-17 NOTE — Progress Notes (Signed)
[facility] Progress Note   Subjective:   STEC identified on GI panel 2x liquid bowel movements overnight Patient continues to be somnolent. No family at bedside this AM.  Intermittently arousable to voice.   Objective Vital signs in last 24 hours: Vitals:   10/17/22 0500 10/17/22 0600 10/17/22 0730 10/17/22 0800  BP:  (!) 154/76 138/76 138/76  Pulse:  (!) 115 (!) 117   Resp:  (!) 31 (!) 39   Temp:   98.8 F (37.1 C)   TempSrc:      SpO2:  97% 97%   Weight: 49.5 kg     Height:       Weight change: -2.9 kg  Intake/Output Summary (Last 24 hours) at 10/17/2022 6301 Last data filed at 10/17/2022 6010 Gross per 24 hour  Intake 1591.13 ml  Output 2970 ml  Net -1378.87 ml    Assessment/ Plan: Pt is a 62 y.o. yo female who was admitted on 10/14/2022 with chronic history of heavy alcohol use disorder c/b seizures, cirrhosis, chronic thrombocytopenia, HTN, MDD, admitted for sepsis secondary to E coli bacteremia now identified as STEC c/b by severe AKI, acute on chronic anemia, and profound thrombocytopenia concerning for HUS   Assessment/Plan: Sepsis secondary to Shiga-like toxin E coli bacteremia  Pulmonary edema, improving -Sick patient with now confirmed STEC gastroenteritis, pyelonephritis and bacteremia on day 4 of antibiotic therapy -Initially on continuous isotonic fluid administration, stopped for pulmonary edema now s/p 80->120 mg IV lasix. Her tachypnea and supplemental O2 requirement has not changed, however, significant improvement on CXR today after 2.6L utine output.  -With STEC and suspected HUS now; appreciate heme/onc recommendations. Would favor ID involvement in this patient given conversion of STEC to HUS on antibiotic therapy -If MAP is 65 or lower, and patient need fluid resuscitation, would consider 5% albumin with 500 mL isotonic fluid bolus  Severe AKI:  Suspect initially prerenal in setting of hypovolemia from poor PO intake, ongoing GI losses,  sepsis, and consistent use of antihypertensive medications. Likely now progressed to ischemic ATN. Concern for HUS given STEC diagnosis - Urine Na mildly elevated at this time; patient previously on hydrochlorothiazide. -Total UOP in 24h 3.170. Net negative 1.6L. No PO intake. Intake from IV carrier fluids and platelets -Improvement in Cr from  3.52 on 9/11 to 3.40 today -GFR improving at 15 today -Given STEC and need for ongoing supportive treatment, will need to consider balance between management of pulmonary edema and fluid resuscitation. Severe hypoalbuminemia present. -Will continue to monitor GFR and Cr -No acute need for HD at this time. Will monitor UOP, volume status, and metabolic derangements as this patient is at high risk for decompensation and HD requirement -IVF resuscitation as above  AG metabolic acidosis : -Previously elevated lactate -AG 16 today. High delta delta gradient likely in the setting of AGMA and metabolic alkalosis -Lactate wnl today.  -Re-evaluate need for furosemide therapy  Hypokalemia, 3.3 today Hypomagnesemia Hypophosphatemia -s/p multiple runs of magnesium and K. Continues to have GI losses -4x 10mE K chloride ordered -Normal Mg and phosphorus today -Will follow up RFP at 1200 for continued supplementation in setting of new furosemide requirement for  Normocytic anemia  Thrombocytopenia Shiga-like toxin associated E Coli HUS -Elevated LDH, elevated coag studies,elevated ddmer. However, elevated fibrinogen and normal IPF. No schistocytes on smear thus far. Gross hematuria still present in foley catheter -Hgb today 7.8. -Hold of iron supplementation in setting of bacteremia S/p 2x PLT transfusions -Will receive another platelet  transfusion today in setting of bleeding and PLT <10 -Transfuse for Hgb <7 -Follow up today's CBC with differential  Hyponatremia:  -Stable at 138 -CTM Hepatic steatosisCirrhosis: No overt evidence of ascites on exam or  imaging. LFTs stable.  EtOH use disorder  protein calorie malnutritionseizure disorder: Patient is likely withdrawing at this time, which is contributing to VS and clinical presentation. CIWA with ativan   Morene Crocker    Labs: Basic Metabolic Panel: Recent Labs  Lab 10/16/22 0600 10/16/22 1200 10/17/22 0455  NA 134* 134* 138  K 2.6* 3.3* 3.3*  CL 97* 96* 95*  CO2 24 25 27   GLUCOSE 114* 111* 80  BUN 58* 62* 63*  CREATININE 3.52* 3.55* 3.40*  CALCIUM 7.7* 7.7* 8.2*  PHOS 1.5* 2.1* 4.1   Liver Function Tests: Recent Labs  Lab 10/14/22 0909 10/15/22 0540 10/15/22 1600 10/16/22 0600 10/16/22 1200  AST 66* 49*  --  63*  --   ALT 30 23  --  23  --   ALKPHOS 144* 84  --  108  --   BILITOT 3.9* 4.2*  --  4.2*  --   PROT 6.9 5.1*  --  4.6*  --   ALBUMIN 2.5* 1.8* 1.7* 1.6* 1.6*   No results for input(s): "LIPASE", "AMYLASE" in the last 168 hours. No results for input(s): "AMMONIA" in the last 168 hours. CBC: Recent Labs  Lab 10/14/22 0909 10/15/22 0540 10/15/22 1153 10/15/22 2035 10/16/22 0600 10/16/22 2012 10/17/22 0455  WBC 17.2* 14.7*  --  12.2* 12.4* 14.7* 15.7*  NEUTROABS 15.1*  --   --   --  10.4*  --   --   HGB 10.2* 8.0*  --  7.2* 7.2* 7.6* 7.8*  HCT 28.4* 21.7*  --  19.3* 19.6* 20.3* 21.3*  MCV 95.0 91.6  --  90.6 88.7 88.6 91.4  PLT 18* 6*   < > 15* 8* 9* 6*   < > = values in this interval not displayed.   Cardiac Enzymes: Recent Labs  Lab 10/14/22 2134  CKTOTAL 541*   CBG: Recent Labs  Lab 10/15/22 2115 10/16/22 0513  GLUCAP 139* 123*    Iron Studies:  Recent Labs    10/15/22 0540  IRON 6*  TIBC 126*  FERRITIN 590*   Studies/Results: DG CHEST PORT 1 VIEW  Result Date: 10/16/2022 CLINICAL DATA:  Shortness of breath. EXAM: PORTABLE CHEST 1 VIEW COMPARISON:  Chest radiograph 10/14/2022. FINDINGS: Interval development of diffuse bilateral reticular opacities, which may reflect pulmonary edema or atypical/viral infection. No  pleural effusion or pneumothorax. Stable cardiac and mediastinal contours. IMPRESSION: Interval development of diffuse bilateral reticular opacities, which may reflect pulmonary edema or atypical/viral infection. Electronically Signed   By: Orvan Falconer M.D.   On: 10/16/2022 10:42   DG Swallowing Func-Speech Pathology  Result Date: 10/15/2022 Table formatting from the original result was not included. Modified Barium Swallow Study Patient Details Name: LAILIE CHOPLIN MRN: [id number] Date of Birth: 1960/02/25 Today's Date: 10/15/2022 HPI/PMH: HPI: Pt is a 62 y.o. female who presented with generalized aches and malaise x3-4 days. She noted nausea, emesis, and diarreha, but her significant other denied noting these symptoms. Increasing confusion noted from family. PMH is significant for etoh abuse, panreatitis, seizures, hypertension, and GIB. Clinical Impression: Clinical Impression: Patient presents with a moderate oral dysphagia and mild pharyngeal phase dysphagia as per this MBS. In addition, cognitive impairment impacted study, as patient was constantly moving her head around and required cues to  reposition as well as to initiate swallow. Boluses of thin liquid, nectar thick liquids and puree solids were administered. Swallow was initiated at level of vallecular sinus. Epiglottic inversion was complete but anterior hyoid excursion and laryngeal elevation appeared partial in completion. Lingual control of bolus and tongue base retraction were both impaired, leading to poor bolus cohesion, reduced anterior to posterior oral transit of boluses. Mild amount of residuals observed in vallecular sinus, aryepiglottic folds, posterior pharyngeal wall and pyriform sinus with liquid boluses and moderate amount of vallecular residuals with puree solids were observed. Residuals. started to clear with subsequent swallows. Appearance of narrowing of space at PES observed which resulted in mild amount of barium remaining  above PES after initial swallows before clearing during subsequent swallows. During esophageal sweep, appearance of decreased bolus cohesion and slow transit of puree solid barium observed. No aspiration or penetration seen. SLP recommending to continue with full liquids (thin) diet and will follow for toleration and ability to advance. Factors that may increase risk of adverse event in presence of aspiration Rubye Oaks & Clearance Coots 2021): Factors that may increase risk of adverse event in presence of aspiration Rubye Oaks & Clearance Coots 2021): Frail or deconditioned; Reduced cognitive function; Weak cough; Poor general health and/or compromised immunity Recommendations/Plan: Swallowing Evaluation Recommendations Swallowing Evaluation Recommendations Recommendations: PO diet PO Diet Recommendation: Full liquid diet; Thin liquids (Level 0) Liquid Administration via: Cup; Straw Medication Administration: Crushed with puree Supervision: Full supervision/cueing for swallowing strategies; Staff to assist with self-feeding; Patient able to self-feed Swallowing strategies  : Slow rate; Small bites/sips; Minimize environmental distractions Postural changes: Position pt fully upright for meals; Stay upright 30-60 min after meals Oral care recommendations: Oral care BID (2x/day) Treatment Plan Treatment Plan Treatment recommendations: Therapy as outlined in treatment plan below Follow-up recommendations: No SLP follow up Functional status assessment: Patient has had a recent decline in their functional status and demonstrates the ability to make significant improvements in function in a reasonable and predictable amount of time. Treatment frequency: Min 2x/week Treatment duration: 1 week Interventions: Aspiration precaution training; Trials of upgraded texture/liquids; Diet toleration management by SLP; Patient/family education Recommendations Recommendations for follow up therapy are one component of a multi-disciplinary discharge  planning process, led by the attending physician.  Recommendations may be updated based on patient status, additional functional criteria and insurance authorization. Assessment: Orofacial Exam: Orofacial Exam Oral Cavity - Dentition: Adequate natural dentition Anatomy: Anatomy: WFL Boluses Administered: Boluses Administered Boluses Administered: Thin liquids (Level 0); Mildly thick liquids (Level 2, nectar thick); Puree  Oral Impairment Domain: Oral Impairment Domain Lip Closure: No labial escape Tongue control during bolus hold: Not tested Bolus preparation/mastication: Slow prolonged chewing/mashing with complete recollection Bolus transport/lingual motion: Slow tongue motion Oral residue: Residue collection on oral structures Location of oral residue : Tongue; Palate Initiation of pharyngeal swallow : Valleculae  Pharyngeal Impairment Domain: Pharyngeal Impairment Domain Soft palate elevation: No bolus between soft palate (SP)/pharyngeal wall (PW) Laryngeal elevation: Complete superior movement of thyroid cartilage with complete approximation of arytenoids to epiglottic petiole Anterior hyoid excursion: Partial anterior movement Epiglottic movement: Complete inversion Laryngeal vestibule closure: Incomplete, narrow column air/contrast in laryngeal vestibule Pharyngeal stripping wave : Present - diminished Pharyngeal contraction (A/P view only): N/A Pharyngoesophageal segment opening: Partial distention/partial duration, partial obstruction of flow Tongue base retraction: Narrow column of contrast or air between tongue base and PPW Pharyngeal residue: Collection of residue within or on pharyngeal structures Location of pharyngeal residue: Valleculae; Aryepiglottic folds; Pyriform sinuses; Diffuse (>3 areas);  Pharyngeal wall; Tongue base  Esophageal Impairment Domain: Esophageal Impairment Domain Esophageal clearance upright position: Complete clearance, esophageal coating Pill: No data recorded  Penetration/Aspiration Scale Score: Penetration/Aspiration Scale Score 1.  Material does not enter airway: Thin liquids (Level 0); Mildly thick liquids (Level 2, nectar thick); Puree Compensatory Strategies: Compensatory Strategies Compensatory strategies: Yes Straw: Effective Effective Straw: Thin liquid (Level 0)   General Information: Caregiver present: No  Diet Prior to this Study: Full liquid diet; Thin liquids (Level 0)   Temperature : Normal   Respiratory Status: WFL   Supplemental O2: None (Room air)   History of Recent Intubation: No  Behavior/Cognition: Alert; Cooperative; Confused; Requires cueing; Distractible Self-Feeding Abilities: Able to self-feed; Needs set-up for self-feeding Baseline vocal quality/speech: Normal Volitional Cough: Unable to elicit Volitional Swallow: Able to elicit Exam Limitations: Excessive movement Goal Planning: Prognosis for improved oropharyngeal function: Good Barriers to Reach Goals: Cognitive deficits No data recorded Patient/Family Stated Goal: None stated Consulted and agree with results and recommendations: Pt unable/family or caregiver not available Pain: Pain Assessment Pain Assessment: No/denies pain End of Session: Start Time:SLP Start Time (ACUTE ONLY): 1400 Stop Time: SLP Stop Time (ACUTE ONLY): 1415 Time Calculation:SLP Time Calculation (min) (ACUTE ONLY): 15 min Charges: SLP Evaluations $ SLP Speech Visit: 1 Visit SLP Evaluations $BSS Swallow: 1 Procedure $MBS Swallow: 1 Procedure SLP visit diagnosis: SLP Visit Diagnosis: Dysphagia, oropharyngeal phase (R13.12) Past Medical History: Past Medical History: Diagnosis Date  Alcoholism (HCC) 2011  Asthma   Chronic lower back pain   Colitis 04/2014.  Bloody diarrhea  Daily headache   GIB (gastrointestinal bleeding)   Hypertension   Pancreatitis   Chronic pancreatitis noted on CT scan in 04/2014.  Polysubstance abuse (HCC) 2011  Pyelonephritis 05/2012  Seizures (HCC)   last seizure was 04/2019 per patient  Past Surgical  History: Past Surgical History: Procedure Laterality Date  COLONOSCOPY WITH PROPOFOL N/A 09/09/2019  Procedure: COLONOSCOPY WITH PROPOFOL;  Surgeon: Charlott Rakes, MD;  Location: [facility] ENDOSCOPY;  Service: Endoscopy;  Laterality: N/A;  ESOPHAGOGASTRODUODENOSCOPY N/A 05/31/2015  Procedure: ESOPHAGOGASTRODUODENOSCOPY (EGD);  Surgeon: Hilarie Fredrickson, MD;  Location[facility] END[facility]COPY;  Service: Endoscopy;  Laterality: N/A;  ESOPHAGOGASTRODUODENOSCOPY N/A 09/09/2019  Procedure: ESOPHAGOGASTRODUODENOSCOPY (EGD);  Surgeon: Charlott Rakes, MD;  Location: [facility] ENDOSCOPY;  Service: Endoscopy;  Laterality: N/A;  ORIF HUMERUS FRACTURE Right 05/12/2019  Procedure: OPEN REDUCTION INTERNAL FIXATION (ORIF) DISTAL HUMERUS FRACTURE;  Surgeon: Bjorn Pippin, MD;  Location: [facility] ORS;  Service: Orthopedics;  Laterality: Right; Angela Nevin, MA, CCC-SLP Speech Therapy   Medications: Infusions:  cefTRIAXone (ROCEPHIN)  IV 2 g (10/17/22 0930)   potassium chloride 10 mEq (10/17/22 0927)   thiamine (VITAMIN B1) injection Stopped (10/17/22 0531)   Followed by   thiamine (VITAMIN B1) injection      Scheduled Medications:  sodium chloride   Intravenous Once   Chlorhexidine Gluconate Cloth  6 each Topical Q0600   folic acid  1 mg Oral Daily   multivitamin with minerals  1 tablet Oral Daily   [START ON 10/22/2022] thiamine  100 mg Oral Daily   I have reviewed scheduled and prn medications.  Physical Exam: General: woman resting in bed in NAD HEENT: dry mucus membranes with increased mouth breathing Heart: Tachycardic, no murmurs present Lungs: Persistent increased work of breathing, speaking in short sentences. Crackles on BL lung bases present. Abdomen: Non tender, mildly distended. Hyperactive bowel sounds Extremities:  Warm and dry. Non pitting edema Neuro: Somnolent GU: Foley catheter present, small volume  of gross hematuria in foley Skin: no rashes, petechiae, or ecchymoses noted   10/17/2022,9:34 AM  LOS: 3 days

## 2022-10-17 NOTE — Progress Notes (Addendum)
PROGRESS NOTE    Brandy Bishop  XBM:[id number] DOB: 1960-10-16 DOA: 10/14/2022 PCP: Cline Crock, NP   Brief Narrative:  62 year old female with history of alcohol abuse, pancreatitis, seizures, hypertension presented with generalized weakness and shortness along with nausea, vomiting and diarrhea along with confusion.  Reliability of the history on presentation was poor and patient was dysarthric on initial exam (significant other reported that her last alcoholic drink was 1 day prior to presentation).  On presentation, patient was hypotensive with creatinine of 5.37, potassium of 2, sodium of 133, bicarb of 15, alkaline phosphatase 144, AST 66 with WBCs of 17.2, platelets of 18 and lactic acid of 4.4.  UA was suggestive of UTI.  She was started on IV fluids and antibiotics.  PCCM was consulted who recommended IV fluids and antibiotics and admission to [facility] service.  CT of head without contrast showed no acute abnormality.  CT of abdomen and pelvis without contrast showed possible bilateral perinephric stranding, could be related to pyelonephritis; hepatic steatosis with probable cirrhosis along with diverticular disease of the colon without wall thickening; possibility of diarrheal illness.  Right upper quadrant ultrasound showed hepatic steatosis and or hepatocellular disease.  Influenza/COVID-19 testing was negative.    Assessment & Plan:   Severe sepsis: Present on admission Likely from STEC gastroenteritis, E.coli bacteremia, ?pyelonephritis  Presented with fever, leukocytosis, tachycardia, tachypnea with pyuria and imaging evidence of possible acute bilateral pyelonephritis Blood culture positive for E. coli.  Follow sensitivities Continue Rocephin Monitor closely  Volume overload Tachypneic to the 30s-40s Chest x-ray with increased pulmonary edema, volume overload Stop IV fluid Diurese with Lasix Continue supplemental O2, BiPAP as needed PCCM on board  Acute kidney  injury Acute metabolic acidosis ?HUS from STEC gastroenteritis  Creatinine 5.37 on presentation; was 0.55 on 07/02/2022. S/p IVF, bicarb drip, stopped due to pulmonary edema Nephrology on board  Hypokalemia Replace prn  Noted hematuria Foley placement with gross hematuria noted Noted thrombocytopenia Monitor closely CBC  Acute metabolic encephalopathy Generalized weakness Dysarthria Etiology multifactorial, alcohol withdrawal, sepsis CT of the head was negative for acute intracranial abnormity May need MRI brain if symptoms persist Fall precautions.  PT evaluation once more stable.  SLP eval.  Hepatic steatosis with probable cirrhosis Elevated LFTs Likely from alcohol abuse.  Patient needs to abstain from alcohol.  Monitor LFTs Will possibly need GI workup and follow-up as an outpatient  Acute on chronic thrombocytopenia Possibly from current illnesses, HUS Patient possibly has chronic thrombocytopenia from alcohol abuse Transfused 3 unit of platelets so far Hematology Dr. Leonides Schanz consulted, appreciate recs  Anemia of chronic disease/iron deficiency anemia Baseline hemoglobin around 9-10 Daily CBC  Alcohol abuse with possible withdrawal Last drink was a day prior to presentation as per significant other Currently on high-dose thiamine.  Continue folic acid and multivitamin.  Continue CIWA protocol. TOC consult  Multiple electrolyte derangements Possible refeeding syndrome Hypokalemia/hypomagnesemia/hypophosphatemia Replete electrolytes aggressively Nutrition consulted, appreciate recs   DVT prophylaxis: SCDs Code Status: Full Family Communication: None at bedside Disposition Plan: Status is: Inpatient Remains inpatient appropriate because: Of severity of illness.    Consultants: PCCM/nephrology.  Hematology  Procedures: None  Antimicrobials:  Anti-infectives (From admission, onward)    Start     Dose/Rate Route Frequency Ordered Stop   10/15/22 1000   cefTRIAXone (ROCEPHIN) 1 g in sodium chloride 0.9 % 100 mL IVPB  Status:  Discontinued        1 g 200 mL/hr over 30 Minutes Intravenous Every 24 hours  10/14/22 1746 10/15/22 0018   10/15/22 1000  cefTRIAXone (ROCEPHIN) 2 g in sodium chloride 0.9 % 100 mL IVPB        2 g 200 mL/hr over 30 Minutes Intravenous Every 24 hours 10/15/22 0018     10/14/22 1000  ceFEPIme (MAXIPIME) 2 g in sodium chloride 0.9 % 100 mL IVPB        2 g 200 mL/hr over 30 Minutes Intravenous  Once 10/14/22 0946 10/14/22 1541   10/14/22 0945  aztreonam (AZACTAM) 2 g in sodium chloride 0.9 % 100 mL IVPB  Status:  Discontinued        2 g 200 mL/hr over 30 Minutes Intravenous  Once 10/14/22 0943 10/14/22 0946   10/14/22 0945  metroNIDAZOLE (FLAGYL) IVPB 500 mg        500 mg 100 mL/hr over 60 Minutes Intravenous  Once 10/14/22 0943 10/14/22 1309   10/14/22 0945  vancomycin (VANCOCIN) IVPB 1000 mg/200 mL premix        1,000 mg 200 mL/hr over 60 Minutes Intravenous  Once 10/14/22 0943 10/14/22 1643        Subjective: Today, patient appears very lethargic, with increased work of breathing.  Likely from volume overload.   Objective: Vitals:   10/17/22 1325 10/17/22 1332 10/17/22 1347 10/17/22 1611  BP:  112/68 120/66 134/84  Pulse:   96 99  Resp: (!) 42  (!) 43 (!) 26  Temp: 98.4 F (36.9 C) 98.4 F (36.9 C) 98.2 F (36.8 C) (!) 100.8 F (38.2 C)  TempSrc: Axillary Axillary  Axillary  SpO2:    100%  Weight:      Height:        Intake/Output Summary (Last 24 hours) at 10/17/2022 1758 Last data filed at 10/17/2022 1649 Gross per 24 hour  Intake 1599.13 ml  Output 2750 ml  Net -1150.87 ml   Filed Weights   10/15/22 0541 10/16/22 0532 10/17/22 0500  Weight: 46 kg 52.4 kg 49.5 kg    Examination: General: NAD, lethargic, cachetic, chronically ill-appearing, deconditioned Cardiovascular: S1, S2 present Respiratory: Diminished breath sounds bilaterally Abdomen: Soft, nontender, distended, bowel sounds  present Musculoskeletal: No bilateral pedal edema noted Skin: Normal Psychiatry: Poor mood    Data Reviewed: I have personally reviewed following labs and imaging studies  CBC: Recent Labs  Lab 10/14/22 0909 10/15/22 0540 10/15/22 1153 10/15/22 2035 10/16/22 0600 10/16/22 2012 10/17/22 0455  WBC 17.2* 14.7*  --  12.2* 12.4* 14.7* 15.7*  NEUTROABS 15.1*  --   --   --  10.4*  --   --   HGB 10.2* 8.0*  --  7.2* 7.2* 7.6* 7.8*  HCT 28.4* 21.7*  --  19.3* 19.6* 20.3* 21.3*  MCV 95.0 91.6  --  90.6 88.7 88.6 91.4  PLT 18* 6* <5* 15* 8* 9* 6*   Basic Metabolic Panel: Recent Labs  Lab 10/14/22 2134 10/14/22 2134 10/15/22 0540 10/15/22 1600 10/16/22 0600 10/16/22 1200 10/17/22 0455 10/17/22 1157  NA 134*  --  132* 134* 134* 134* 138 135  K 2.1*  --  2.1* 2.8* 2.6* 3.3* 3.3* 3.6  CL 100  --  105 97* 97* 96* 95* 97*  CO2 14*  --  14* 18* 24 25 27 26   GLUCOSE 84  --  73 188* 114* 111* 80 70  BUN 63*  --  65* 63* 58* 62* 63* 69*  CREATININE 4.96*  --  4.43* 4.00* 3.52* 3.55* 3.40* 3.48*  CALCIUM 7.7*  --  7.4* 7.6* 7.7* 7.7* 8.2* 8.1*  MG 0.7*  --  2.2  --  1.8 2.5* 2.4  --   PHOS  --    < > 1.8* 3.4 1.5* 2.1* 4.1 4.6   < > = values in this interval not displayed.   GFR: Estimated Creatinine Clearance: 13.3 mL/min (A) (by C-G formula based on SCr of 3.48 mg/dL (H)). Liver Function Tests: Recent Labs  Lab 10/14/22 0909 10/15/22 0540 10/15/22 1600 10/16/22 0600 10/16/22 1200 10/17/22 1157  AST 66* 49*  --  63*  --   --   ALT 30 23  --  23  --   --   ALKPHOS 144* 84  --  108  --   --   BILITOT 3.9* 4.2*  --  4.2*  --   --   PROT 6.9 5.1*  --  4.6*  --   --   ALBUMIN 2.5* 1.8* 1.7* 1.6* 1.6* 1.5*   No results for input(s): "LIPASE", "AMYLASE" in the last 168 hours. No results for input(s): "AMMONIA" in the last 168 hours. Coagulation Profile: Recent Labs  Lab 10/14/22 0909 10/15/22 1153  INR 1.4* 1.5*   Cardiac Enzymes: Recent Labs  Lab 10/14/22 2134   CKTOTAL 541*   BNP (last 3 results) No results for input(s): "PROBNP" in the last 8760 hours. HbA1C: No results for input(s): "HGBA1C" in the last 72 hours. CBG: Recent Labs  Lab 10/15/22 2115 10/16/22 0513  GLUCAP 139* 123*   Lipid Profile: No results for input(s): "CHOL", "HDL", "LDLCALC", "TRIG", "CHOLHDL", "LDLDIRECT" in the last 72 hours. Thyroid Function Tests: No results for input(s): "TSH", "T4TOTAL", "FREET4", "T3FREE", "THYROIDAB" in the last 72 hours. Anemia Panel: Recent Labs    10/15/22 0540 10/15/22 2035  VITAMINB12 894  --   FOLATE 13.5  --   FERRITIN 590*  --   TIBC 126*  --   IRON 6*  --   RETICCTPCT  --  0.6   Sepsis Labs: Recent Labs  Lab 10/16/22 0948 10/16/22 1145 10/17/22 0950 10/17/22 1229  LATICACIDVEN 2.1* 2.0* 1.4 1.2    Recent Results (from the past 240 hour(s))  Blood Culture (routine x 2)     Status: Abnormal   Collection Time: 10/14/22  9:40 AM   Specimen: BLOOD  Result Value Ref Range Status   Specimen Description BLOOD LEFT ANTECUBITAL  Final   Special Requests   Final    BOTTLES DRAWN AEROBIC AND ANAEROBIC Blood Culture results may not be optimal due to an excessive volume of blood received in culture bottles   Culture  Setup Time   Final    GRAM NEGATIVE RODS IN BOTH AEROBIC AND ANAEROBIC BOTTLES CRITICAL RESULT CALLED TO, READ BACK BY AND VERIFIED WITH: PHARMD G. ABBOTT 10/15/22 @ 0011 BY AB Performed at [facility], [address] [place], [place] [address]    Culture ESCHERICHIA COLI (A)  Final   Report Status 10/17/2022 FINAL  Final   Organism ID, Bacteria ESCHERICHIA COLI  Final      Susceptibility   Escherichia coli - MIC*    AMPICILLIN <=2 SENSITIVE Sensitive     CEFEPIME <=0.12 SENSITIVE Sensitive     CEFTAZIDIME <=1 SENSITIVE Sensitive     CEFTRIAXONE <=0.25 SENSITIVE Sensitive     CIPROFLOXACIN <=0.25 SENSITIVE Sensitive     GENTAMICIN <=1 SENSITIVE Sensitive     IMIPENEM <=0.25 SENSITIVE Sensitive      TRIMETH/SULFA <=20 SENSITIVE Sensitive  AMPICILLIN/SULBACTAM <=2 SENSITIVE Sensitive     PIP/TAZO <=4 SENSITIVE Sensitive     * ESCHERICHIA COLI  Blood Culture ID Panel (Reflexed)     Status: Abnormal   Collection Time: 10/14/22  9:40 AM  Result Value Ref Range Status   Enterococcus faecalis NOT DETECTED NOT DETECTED Final   Enterococcus Faecium NOT DETECTED NOT DETECTED Final   Listeria monocytogenes NOT DETECTED NOT DETECTED Final   Staphylococcus species NOT DETECTED NOT DETECTED Final   Staphylococcus aureus (BCID) NOT DETECTED NOT DETECTED Final   Staphylococcus epidermidis NOT DETECTED NOT DETECTED Final   Staphylococcus lugdunensis NOT DETECTED NOT DETECTED Final   Streptococcus species NOT DETECTED NOT DETECTED Final   Streptococcus agalactiae NOT DETECTED NOT DETECTED Final   Streptococcus pneumoniae NOT DETECTED NOT DETECTED Final   Streptococcus pyogenes NOT DETECTED NOT DETECTED Final   A.calcoaceticus-baumannii NOT DETECTED NOT DETECTED Final   Bacteroides fragilis NOT DETECTED NOT DETECTED Final   Enterobacterales DETECTED (A) NOT DETECTED Final    Comment: Enterobacterales represent a large order of gram negative bacteria, not a single organism. CRITICAL RESULT CALLED TO, READ BACK BY AND VERIFIED WITH: PHARMD G. ABBOTT 10/15/22 @ 0011 BY AB    Enterobacter cloacae complex NOT DETECTED NOT DETECTED Final   Escherichia coli DETECTED (A) NOT DETECTED Final    Comment: CRITICAL RESULT CALLED TO, READ BACK BY AND VERIFIED WITH: PHARMD G. ABBOTT 10/15/22 @ 0031 BY AB    Klebsiella aerogenes NOT DETECTED NOT DETECTED Final   Klebsiella oxytoca NOT DETECTED NOT DETECTED Final   Klebsiella pneumoniae NOT DETECTED NOT DETECTED Final   Proteus species NOT DETECTED NOT DETECTED Final   Salmonella species NOT DETECTED NOT DETECTED Final   Serratia marcescens NOT DETECTED NOT DETECTED Final   Haemophilus influenzae NOT DETECTED NOT DETECTED Final   Neisseria meningitidis  NOT DETECTED NOT DETECTED Final   Pseudomonas aeruginosa NOT DETECTED NOT DETECTED Final   Stenotrophomonas maltophilia NOT DETECTED NOT DETECTED Final   Candida albicans NOT DETECTED NOT DETECTED Final   Candida auris NOT DETECTED NOT DETECTED Final   Candida glabrata NOT DETECTED NOT DETECTED Final   Candida krusei NOT DETECTED NOT DETECTED Final   Candida parapsilosis NOT DETECTED NOT DETECTED Final   Candida tropicalis NOT DETECTED NOT DETECTED Final   Cryptococcus neoformans/gattii NOT DETECTED NOT DETECTED Final   CTX-M ESBL NOT DETECTED NOT DETECTED Final   Carbapenem resistance IMP NOT DETECTED NOT DETECTED Final   Carbapenem resistance KPC NOT DETECTED NOT DETECTED Final   Carbapenem resistance NDM NOT DETECTED NOT DETECTED Final   Carbapenem resist OXA 48 LIKE NOT DETECTED NOT DETECTED Final   Carbapenem resistance VIM NOT DETECTED NOT DETECTED Final    Comment: Performed at [facility], [address], [place], [place] [address]  SARS Coronavirus 2 by RT PCR (hospital order, performed in [facility] hospital lab) *cepheid single result test* Anterior Nasal Swab     Status: None   Collection Time: 10/14/22  5:26 PM   Specimen: Anterior Nasal Swab  Result Value Ref Range Status   SARS Coronavirus 2 by RT PCR NEGATIVE NEGATIVE Final    Comment: Performed at [facility], [address], [place], [place] [address]  Resp Panel by RT-PCR (Flu A&B, Covid)     Status: None   Collection Time: 10/14/22  5:26 PM  Result Value Ref Range Status   SARS Coronavirus 2 by RT PCR NEGATIVE NEGATIVE Final   Influenza A by  PCR NEGATIVE NEGATIVE Final   Influenza B by PCR NEGATIVE NEGATIVE Final    Comment: (NOTE) The Xpert Xpress SARS-CoV-2/FLU/RSV plus assay is intended as an aid in the diagnosis of influenza from Nasopharyngeal swab specimens and should not be used as a sole basis for treatment. Nasal washings and aspirates are unacceptable for Xpert Xpress  SARS-CoV-2/FLU/RSV testing.  Fact Sheet for Patients: BloggerCourse.com  Fact Sheet for Healthcare Providers: SeriousBroker.it  This test is not yet approved or cleared by the [place] FDA and has been authorized for detection and/or diagnosis of SARS-CoV-2 by FDA under an Emergency Use Authorization (EUA). This EUA will remain in effect (meaning this test can be used) for the duration of the COVID-19 declaration under Section 564(b)(1) of the Act, 21 U.S.C. section 360bbb-3(b)(1), unless the authorization is terminated or revoked.  Performed at [facility], [address], [place], [place] [address]   MRSA Next Gen by PCR, Nasal     Status: None   Collection Time: 10/14/22  8:27 PM   Specimen: Nasal Mucosa; Nasal Swab  Result Value Ref Range Status   MRSA by PCR Next Gen NOT DETECTED NOT DETECTED Final    Comment: (NOTE) The GeneXpert MRSA Assay (FDA approved for NASAL specimens only), is one component of a comprehensive MRSA colonization surveillance program. It is not intended to diagnose MRSA infection nor to guide or monitor treatment for MRSA infections. Test performance is not FDA approved in patients less than 56 years old. Performed at [facility], [address] [place], [place] [address]   Blood Culture (routine x 2)     Status: None (Preliminary result)   Collection Time: 10/14/22  9:34 PM   Specimen: BLOOD LEFT HAND  Result Value Ref Range Status   Specimen Description BLOOD LEFT HAND  Final   Special Requests   Final    BOTTLES DRAWN AEROBIC AND ANAEROBIC Blood Culture adequate volume   Culture   Final    NO GROWTH 3 DAYS Performed at [facility], [address] [place], [place] [address]    Report Status PENDING  Incomplete  C Difficile Quick Screen w PCR reflex     Status: None   Collection Time: 10/15/22  6:09 PM   Specimen: STOOL  Result Value Ref Range Status   C Diff  antigen NEGATIVE NEGATIVE Final   C Diff toxin NEGATIVE NEGATIVE Final   C Diff interpretation No C. difficile detected.  Final    Comment: Performed at [facility], [address], [place], [place] [address]  Gastrointestinal Panel by PCR , Stool     Status: Abnormal   Collection Time: 10/15/22  6:09 PM   Specimen: Stool  Result Value Ref Range Status   Campylobacter species NOT DETECTED NOT DETECTED Final   Plesimonas shigelloides NOT DETECTED NOT DETECTED Final   Salmonella species NOT DETECTED NOT DETECTED Final   Yersinia enterocolitica NOT DETECTED NOT DETECTED Final   Vibrio species NOT DETECTED NOT DETECTED Final   Vibrio cholerae NOT DETECTED NOT DETECTED Final   Enteroaggregative E coli (EAEC) NOT DETECTED NOT DETECTED Final   Enterotoxigenic E coli (ETEC) NOT DETECTED NOT DETECTED Final   Shiga like toxin producing E coli (STEC) DETECTED (A) NOT DETECTED Final    Comment: RESULT CALLED TO, READ BACK BY AND VERIFIED WITH: ROSELOE ZARSONA 2212 10/16/22 MU    E. coli O157 NOT DETECTED NOT DETECTED Final   Shigella/Enteroinvasive E coli (  EIEC) NOT DETECTED NOT DETECTED Final   Cryptosporidium NOT DETECTED NOT DETECTED Final   Cyclospora cayetanensis NOT DETECTED NOT DETECTED Final   Entamoeba histolytica NOT DETECTED NOT DETECTED Final   Giardia lamblia NOT DETECTED NOT DETECTED Final   Adenovirus F40/41 NOT DETECTED NOT DETECTED Final   Astrovirus NOT DETECTED NOT DETECTED Final   Norovirus GI/GII NOT DETECTED NOT DETECTED Final   Rotavirus A NOT DETECTED NOT DETECTED Final   Sapovirus (I, II, IV, and V) NOT DETECTED NOT DETECTED Final    Comment: Performed at [facility], [address] [place], [place] [address]         Radiology Studies: DG CHEST PORT 1 VIEW  Result Date: 10/17/2022 CLINICAL DATA:  Pulmonary edema EXAM: PORTABLE CHEST 1 VIEW COMPARISON:  10/16/2022 FINDINGS: The heart size and mediastinal contours are within normal limits. Similar  appearance of minimal diffuse interstitial opacity. No new or focal airspace opacity. The visualized skeletal structures are unremarkable. IMPRESSION: Similar appearance of minimal diffuse interstitial opacity, consistent with edema or atypical/viral infection. No new or focal airspace opacity. Electronically Signed   By: Jearld Lesch M.D.   On: 10/17/2022 11:50   DG CHEST PORT 1 VIEW  Result Date: 10/16/2022 CLINICAL DATA:  Shortness of breath. EXAM: PORTABLE CHEST 1 VIEW COMPARISON:  Chest radiograph 10/14/2022. FINDINGS: Interval development of diffuse bilateral reticular opacities, which may reflect pulmonary edema or atypical/viral infection. No pleural effusion or pneumothorax. Stable cardiac and mediastinal contours. IMPRESSION: Interval development of diffuse bilateral reticular opacities, which may reflect pulmonary edema or atypical/viral infection. Electronically Signed   By: Orvan Falconer M.D.   On: 10/16/2022 10:42        Scheduled Meds:  Chlorhexidine Gluconate Cloth  6 each Topical Q0600   folic acid  1 mg Oral Daily   multivitamin with minerals  1 tablet Oral Daily   [START ON 10/22/2022] thiamine  100 mg Oral Daily   Continuous Infusions:  cefTRIAXone (ROCEPHIN)  IV 2 g (10/17/22 0930)   thiamine (VITAMIN B1) injection 250 mg (10/17/22 1415)          Briant Cedar, MD [facility] 10/17/2022, 5:58 PM

## 2022-10-17 NOTE — Progress Notes (Signed)
Initial Nutrition Assessment  DOCUMENTATION CODES:   Severe malnutrition in context of chronic illness  INTERVENTION:   If within GOC, recommend Cortrak placement and initiation of enteral nutrition. Discussed with MD and planning to hold off on Cortrak placement for now given severe thrombocytopenia. MD to consult Palliative Medicine Team.  If platelets improve and Cortrak placement is pursued, recommend: - Start Osmolite 1.2 @ 15 ml/hr and advance rate by 10 ml every 12 hours to goal rate of 55 ml/hr (1320 ml/day)  Recommended tube feeding regimen at goal rate would provide 1584 kcal, 73 grams of protein, and 1082 ml of H2O.  If enteral nutrition or dextrose-containing IV fluids are initiated, monitor magnesium, potassium, and phosphorus every 12 hours for at least 6 occurrences. MD to replete as needed as pt is at risk for refeeding syndrome given severe malnutrition, minimal nutrition since admission and for several days prior.  - Continue MVI with minerals, folic acid, thiamine  NUTRITION DIAGNOSIS:   Severe Malnutrition related to chronic illness (chronic pancreatitis, liver disease) as evidenced by severe fat depletion, severe muscle depletion.  GOAL:   Patient will meet greater than or equal to 90% of their needs  MONITOR:   PO intake, Labs, Weight trends, I & O's  REASON FOR ASSESSMENT:   Malnutrition Screening Tool, Consult Assessment of nutrition requirement/status  ASSESSMENT:   62 year old female who presented to the ED on 9/09 with weakness, generalized body aches, and decreased PO intake for 2-3 days. PMH of EtOH abuse, chronic liver disease, chronic pancreatitis, seizures, HTN. Pt admitted with severe hypokalemia, AKI, anion gap metabolic acidosis, and severe sepsis.  9/10 - MBS with recommendations for full liquid diet (thin)  Pt has been diagnosed with STEC gastroenteritis, pyelonephritis, and bacteremia. IV fluids have been discontinued due to pulmonary  edema. Hematology/Oncology team involved. There is concern for HUS.  Discussed pt with RN. Pt has not woken up enough to eat anything today. Pt is on a full liquid diet but level of consciousness was not sufficient for SLP evaluation today. Pt has been unable to take PO medications due to lethargy.  Attempted to speak with pt at bedside. Pt did not wake up for entirety of RD visit. Unable to obtain diet and weight history at this time.  Reviewed weight history in chart. Weight up almost 10 kg since 06/30/22 and up 4 kg this admission. Pt is net positive 3.1 L. Suspect dry weight is closer to 45.3 kg which appears to be first measured weight this admission.  Based on NFPE, pt meets criteria for severe malnutrition. Recommend Cortrak placement and initiation of enteral nutrition at this time if within pt's GOC. However, pt with severe thrombocytopenia which may hinder placement of Cortrak and bridle. Per RN, pt to receive additional platelets today.  Discussed above recommendations with MD; plan is to hold off on Cortrak placement for now given severe thrombocytopenia. MD to consult Palliative Medicine Team.  Admit weight: 45.3 kg Current weight: 49.5 kg  Medications reviewed and include: folic acid, MVI with minerals, IV thiamine 500 mg every 8 hours, IV abx, IV KCl 10 mEq x 4, IV lasix 120 mg x 1  Labs reviewed: potassium 3.3, chloride 95, BUN 63, creatinine 3.40, WBC 15.7, hemoglobin 7.8, platelets 6  UOP: 3170 ml x 24 hours I/O's: +3.1 L since admit  NUTRITION - FOCUSED PHYSICAL EXAM:  Flowsheet Row Most Recent Value  Orbital Region Severe depletion  Upper Arm Region Moderate depletion  Thoracic and Lumbar  Region Severe depletion  Buccal Region Severe depletion  Temple Region Severe depletion  Clavicle Bone Region Severe depletion  Clavicle and Acromion Bone Region Severe depletion  Scapular Bone Region Severe depletion  Dorsal Hand Severe depletion  Patellar Region Severe  depletion  Anterior Thigh Region Severe depletion  Posterior Calf Region Severe depletion  Edema (RD Assessment) None  Hair Reviewed  Eyes Reviewed  Mouth Reviewed  Skin Reviewed  Nails Reviewed    Diet Order:   Diet Order             Diet full liquid Room service appropriate? Yes; Fluid consistency: Thin  Diet effective now                   EDUCATION NEEDS:   Not appropriate for education at this time  Skin:  Skin Assessment: Reviewed RN Assessment  Last BM:  10/16/22 multiple type 7  Height:   Ht Readings from Last 1 Encounters:  10/14/22 5\' 3"  (1.6 m)    Weight:   Wt Readings from Last 1 Encounters:  10/17/22 49.5 kg    BMI:  Body mass index is 19.33 kg/m.  Estimated Nutritional Needs:   Kcal:  1550-1750  Protein:  70-80 grams  Fluid:  1.5-1.7 L    Mertie Clause, MS, RD, LDN Registered Dietitian II Please see AMiON for contact information.

## 2022-10-18 ENCOUNTER — Inpatient Hospital Stay (HOSPITAL_COMMUNITY): Payer: MEDICAID

## 2022-10-18 DIAGNOSIS — R6520 Severe sepsis without septic shock: Secondary | ICD-10-CM | POA: Diagnosis not present

## 2022-10-18 DIAGNOSIS — A419 Sepsis, unspecified organism: Secondary | ICD-10-CM | POA: Diagnosis not present

## 2022-10-18 LAB — CBC WITH DIFFERENTIAL/PLATELET
Abs Immature Granulocytes: 0.33 10*3/uL — ABNORMAL HIGH (ref 0.00–0.07)
Basophils Absolute: 0.1 10*3/uL (ref 0.0–0.1)
Basophils Relative: 0 %
Eosinophils Absolute: 0.2 10*3/uL (ref 0.0–0.5)
Eosinophils Relative: 1 %
HCT: 20.3 % — ABNORMAL LOW (ref 36.0–46.0)
Hemoglobin: 7.2 g/dL — ABNORMAL LOW (ref 12.0–15.0)
Immature Granulocytes: 2 %
Lymphocytes Relative: 8 %
Lymphs Abs: 1.1 10*3/uL (ref 0.7–4.0)
MCH: 31.9 pg (ref 26.0–34.0)
MCHC: 35.5 g/dL (ref 30.0–36.0)
MCV: 89.8 fL (ref 80.0–100.0)
Monocytes Absolute: 1.3 10*3/uL — ABNORMAL HIGH (ref 0.1–1.0)
Monocytes Relative: 9 %
Neutro Abs: 11.4 10*3/uL — ABNORMAL HIGH (ref 1.7–7.7)
Neutrophils Relative %: 80 %
Platelets: 17 10*3/uL — CL (ref 150–400)
RBC: 2.26 MIL/uL — ABNORMAL LOW (ref 3.87–5.11)
RDW: 15.2 % (ref 11.5–15.5)
WBC: 14.4 10*3/uL — ABNORMAL HIGH (ref 4.0–10.5)
nRBC: 0 % (ref 0.0–0.2)

## 2022-10-18 LAB — COMPREHENSIVE METABOLIC PANEL
ALT: 26 U/L (ref 0–44)
AST: 58 U/L — ABNORMAL HIGH (ref 15–41)
Albumin: 1.6 g/dL — ABNORMAL LOW (ref 3.5–5.0)
Alkaline Phosphatase: 120 U/L (ref 38–126)
Anion gap: 11 (ref 5–15)
BUN: 73 mg/dL — ABNORMAL HIGH (ref 8–23)
CO2: 27 mmol/L (ref 22–32)
Calcium: 8 mg/dL — ABNORMAL LOW (ref 8.9–10.3)
Chloride: 99 mmol/L (ref 98–111)
Creatinine, Ser: 3.61 mg/dL — ABNORMAL HIGH (ref 0.44–1.00)
GFR, Estimated: 14 mL/min — ABNORMAL LOW (ref >60)
Glucose, Bld: 95 mg/dL (ref 70–99)
Potassium: 3.2 mmol/L — ABNORMAL LOW (ref 3.5–5.1)
Sodium: 137 mmol/L (ref 135–145)
Total Bilirubin: 4.6 mg/dL — ABNORMAL HIGH (ref 0.3–1.2)
Total Protein: 4.9 g/dL — ABNORMAL LOW (ref 6.5–8.1)

## 2022-10-18 LAB — CBC
HCT: 14.8 % — ABNORMAL LOW (ref 36.0–46.0)
Hemoglobin: 5.4 g/dL — CL (ref 12.0–15.0)
MCH: 32.9 pg (ref 26.0–34.0)
MCHC: 36.5 g/dL — ABNORMAL HIGH (ref 30.0–36.0)
MCV: 90.2 fL (ref 80.0–100.0)
Platelets: 12 10*3/uL — CL (ref 150–400)
RBC: 1.64 MIL/uL — ABNORMAL LOW (ref 3.87–5.11)
RDW: 15.6 % — ABNORMAL HIGH (ref 11.5–15.5)
WBC: 14.9 10*3/uL — ABNORMAL HIGH (ref 4.0–10.5)
nRBC: 0 % (ref 0.0–0.2)

## 2022-10-18 LAB — POCT I-STAT 7, (LYTES, BLD GAS, ICA,H+H)
Acid-Base Excess: 5 mmol/L — ABNORMAL HIGH (ref 0.0–2.0)
Bicarbonate: 27.9 mmol/L (ref 20.0–28.0)
Calcium, Ion: 1.09 mmol/L — ABNORMAL LOW (ref 1.15–1.40)
HCT: 18 % — ABNORMAL LOW (ref 36.0–46.0)
Hemoglobin: 6.1 g/dL — CL (ref 12.0–15.0)
O2 Saturation: 86 %
Patient temperature: 38.6
Potassium: 3.1 mmol/L — ABNORMAL LOW (ref 3.5–5.1)
Sodium: 141 mmol/L (ref 135–145)
TCO2: 29 mmol/L (ref 22–32)
pCO2 arterial: 37.5 mmHg (ref 32–48)
pH, Arterial: 7.486 — ABNORMAL HIGH (ref 7.35–7.45)
pO2, Arterial: 52 mmHg — ABNORMAL LOW (ref 83–108)

## 2022-10-18 LAB — PREPARE RBC (CROSSMATCH)

## 2022-10-18 LAB — MAGNESIUM
Magnesium: 1.8 mg/dL (ref 1.7–2.4)
Magnesium: 1.7 mg/dL (ref 1.7–2.4)

## 2022-10-18 LAB — GLUCOSE, CAPILLARY
Glucose-Capillary: 70 mg/dL (ref 70–99)
Glucose-Capillary: 80 mg/dL (ref 70–99)
Glucose-Capillary: 95 mg/dL (ref 70–99)
Glucose-Capillary: 101 mg/dL — ABNORMAL HIGH (ref 70–99)
Glucose-Capillary: 101 mg/dL — ABNORMAL HIGH (ref 70–99)
Glucose-Capillary: 136 mg/dL — ABNORMAL HIGH (ref 70–99)
Glucose-Capillary: 113 mg/dL — ABNORMAL HIGH (ref 70–99)

## 2022-10-18 LAB — BPAM PLATELET PHERESIS
Blood Product Expiration Date: 202409142359
ISSUE DATE / TIME: 202409121321
Unit Type and Rh: 5100

## 2022-10-18 LAB — RENAL FUNCTION PANEL
Albumin: 1.7 g/dL — ABNORMAL LOW (ref 3.5–5.0)
Anion gap: 13 (ref 5–15)
BUN: 69 mg/dL — ABNORMAL HIGH (ref 8–23)
CO2: 27 mmol/L (ref 22–32)
Calcium: 7.9 mg/dL — ABNORMAL LOW (ref 8.9–10.3)
Chloride: 98 mmol/L (ref 98–111)
Creatinine, Ser: 3.41 mg/dL — ABNORMAL HIGH (ref 0.44–1.00)
GFR, Estimated: 15 mL/min — ABNORMAL LOW (ref >60)
Glucose, Bld: 235 mg/dL — ABNORMAL HIGH (ref 70–99)
Phosphorus: 3 mg/dL (ref 2.5–4.6)
Potassium: 3.2 mmol/L — ABNORMAL LOW (ref 3.5–5.1)
Sodium: 138 mmol/L (ref 135–145)

## 2022-10-18 LAB — BILIRUBIN, FRACTIONATED(TOT/DIR/INDIR)
Bilirubin, Direct: 2.8 mg/dL — ABNORMAL HIGH (ref 0.0–0.2)
Indirect Bilirubin: 1.8 mg/dL — ABNORMAL HIGH (ref 0.3–0.9)
Total Bilirubin: 4.6 mg/dL — ABNORMAL HIGH (ref 0.3–1.2)

## 2022-10-18 LAB — HAPTOGLOBIN: Haptoglobin: 190 mg/dL (ref 37–355)

## 2022-10-18 LAB — PREPARE PLATELET PHERESIS: Unit division: 0

## 2022-10-18 LAB — MRSA NEXT GEN BY PCR, NASAL: MRSA by PCR Next Gen: NOT DETECTED

## 2022-10-18 LAB — PHOSPHORUS: Phosphorus: 2.7 mg/dL (ref 2.5–4.6)

## 2022-10-18 MED ORDER — ADULT MULTIVITAMIN W/MINERALS CH
1.0000 | ORAL_TABLET | Freq: Every day | ORAL | Status: DC
  Administered 2022-10-19 – 2022-10-26 (×8): 1
  Filled 2022-10-18 (×8): qty 1

## 2022-10-18 MED ORDER — DEXTROSE IN LACTATED RINGERS 5 % IV SOLN: INTRAVENOUS | Status: AC

## 2022-10-18 MED ORDER — MENINGOCOCCAL VAC B (OMV) IM SUSY
0.5000 mL | PREFILLED_SYRINGE | Freq: Once | INTRAMUSCULAR | Status: AC
  Administered 2022-10-18: 0.5 mL via INTRAMUSCULAR
  Filled 2022-10-18: qty 0.5

## 2022-10-18 MED ORDER — FOLIC ACID 1 MG PO TABS
1.0000 mg | ORAL_TABLET | Freq: Every day | ORAL | Status: DC
  Administered 2022-10-19 – 2022-10-26 (×8): 1 mg
  Filled 2022-10-18 (×8): qty 1

## 2022-10-18 MED ORDER — ORAL CARE MOUTH RINSE: 15.0000 mL | OROMUCOSAL | Status: DC | PRN

## 2022-10-18 MED ORDER — POTASSIUM CHLORIDE 10 MEQ/50ML IV SOLN
10.0000 meq | INTRAVENOUS | Status: AC
  Administered 2022-10-18 (×2): 10 meq via INTRAVENOUS
  Filled 2022-10-18 (×2): qty 50

## 2022-10-18 MED ORDER — OSMOLITE 1.2 CAL PO LIQD
1000.0000 mL | ORAL | Status: DC
  Administered 2022-10-18 – 2022-10-26 (×8): 1000 mL
  Filled 2022-10-18 (×13): qty 1000

## 2022-10-18 MED ORDER — SODIUM CHLORIDE 0.9% IV SOLUTION: Freq: Once | INTRAVENOUS | Status: AC

## 2022-10-18 MED ORDER — ORAL CARE MOUTH RINSE
15.0000 mL | OROMUCOSAL | Status: DC
  Administered 2022-10-18 – 2022-10-27 (×34): 15 mL via OROMUCOSAL

## 2022-10-18 MED ORDER — POTASSIUM CHLORIDE 10 MEQ/100ML IV SOLN
10.0000 meq | INTRAVENOUS | Status: AC
  Administered 2022-10-18 (×2): 10 meq via INTRAVENOUS
  Filled 2022-10-18: qty 100

## 2022-10-18 MED ORDER — HYDRALAZINE HCL 20 MG/ML IJ SOLN
5.0000 mg | Freq: Four times a day (QID) | INTRAMUSCULAR | Status: DC | PRN
  Administered 2022-10-19: 5 mg via INTRAVENOUS
  Filled 2022-10-18 (×3): qty 1

## 2022-10-18 MED ORDER — ALBUMIN HUMAN 25 % IV SOLN
25.0000 g | Freq: Four times a day (QID) | INTRAVENOUS | Status: AC
  Administered 2022-10-18 (×2): 25 g via INTRAVENOUS
  Filled 2022-10-18 (×2): qty 100

## 2022-10-18 MED ORDER — MENINGOCOCCAL A C Y&W-135 OLIG IM SOLN
0.5000 mL | Freq: Once | INTRAMUSCULAR | Status: AC
  Administered 2022-10-18: 0.5 mL via INTRAMUSCULAR
  Filled 2022-10-18 (×2): qty 0.5

## 2022-10-18 NOTE — Plan of Care (Signed)
  Problem: Coping: Goal: Level of anxiety will decrease Outcome: Progressing   Problem: Clinical Measurements: Goal: Respiratory complications will improve Outcome: Progressing   Problem: Elimination: Goal: Will not experience complications related to bowel motility Outcome: Progressing

## 2022-10-18 NOTE — Plan of Care (Signed)

## 2022-10-18 NOTE — Progress Notes (Signed)
Nutrition Follow-Up  DOCUMENTATION CODES:   Severe malnutrition in context of chronic illness  INTERVENTION:   Once Cortrak placement confirmed with abdominal x-ray, initiate enteral nutrition: - Start Osmolite 1.2 @ 15 ml/hr and advance rate by 10 ml ever 12 hours to goal rate of 55 ml/hr (1320 ml/day)  Tube feeding regimen at goal rate provides 1584 kcal, 73 grams of protein, and 1082 ml of H2O.  Monitor magnesium, potassium, and phosphorus every 12 hours for at least 6 occurrences. MD to replete as needed as pt is at risk for refeeding syndrome given severe malnutrition, minimal nutrition since admission (x 4 days) and for several days PTA.  - Continue MVI with minerals and folic acid per tube  - Continue IV thiamine for at least 5 additional days given refeeding risk  NUTRITION DIAGNOSIS:   Severe Malnutrition related to chronic illness (chronic pancreatitis, liver disease) as evidenced by severe fat depletion, severe muscle depletion.  Ongoing, being addressed via enteral nutrition  GOAL:   Patient will meet greater than or equal to 90% of their needs  Progressing with initiation of enteral n  MONITOR:   Diet advancement, Labs, Weight trends, TF tolerance, I & O's  REASON FOR ASSESSMENT:   Consult Enteral/tube feeding initiation and management  ASSESSMENT:   62 year old female who presented to the ED on 9/09 with weakness, generalized body aches, and decreased PO intake for 2-3 days. PMH of EtOH abuse, chronic liver disease, chronic pancreatitis, seizures, HTN. Pt admitted with severe hypokalemia, AKI, anion gap metabolic acidosis, and severe sepsis.  9/10 - MBS with recommendations for full liquid diet (thin) 9/12 - transferred to ICU 9/13 - NPO, Cortrak placement (x-ray pending)  Discussed pt with RN and during ICU rounds. Pt has been diagnosed with STEC gastroenteritis, pyelonephritis, and sepsis secondary to E. coli bacteremia. Per PCCM, pt has been diuresed  adequately with no improvement in status. 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  Discussed pt with PCCM MD. Given improvement in platelets today and ongoing inability to take POs, Cortrak placed today. Consult received for enteral nutrition initiation and management.  Pt has been with minimal nutrition since admission (x 4 days). Per notes, pt with poor PO intake for several days PTA. Pt is at high risk for refeeding syndrome and has already requirement electrolyte replacement multiple times this admission. Currently on D5 in LR @ 60 ml/hr. High-dose thiamine already ordered. RD will also order refeeding labs. Discussed plan with RN.  Admit weight: 45.3 kg Current weight: 48.2 kg  Medications reviewed and include: folic acid, MVI with minerals, IV thiamine 250 mg daily, IV abx, IV KCl 10 mEq x 2 IVF: D5 in LR @ 60 ml/hr  Labs reviewed: potassium 3.2, BUN 73, creatinine 3.61, WBC 14.4, hemoglobin 7.2, platelets 17 CBG's: 48-144 x 24 hours  UOP: 2550 ml x 24 hours I/O's: +1.0 L since admit  Diet Order:   Diet Order             Diet NPO time specified  Diet effective now                   EDUCATION NEEDS:   Not appropriate for education at this time  Skin:  Skin Assessment: Reviewed RN Assessment  Last BM:  10/17/22 type 7  Height:   Ht Readings from Last 1 Encounters:  10/14/22 5\' 3"  (1.6 m)    Weight:   Wt Readings from Last 1 Encounters:  10/18/22 48.2 kg    BMI:  Body mass index is 18.82 kg/m.  Estimated Nutritional Needs:   Kcal:  1550-1750  Protein:  70-80 grams  Fluid:  1.5-1.7 L    Mertie Clause, MS, RD, LDN Registered Dietitian II Please see AMiON for contact information.

## 2022-10-18 NOTE — Consult Note (Signed)
Consultation Note Date: 10/18/2022   Patient Name: Brandy Bishop  DOB: 1960/12/03  MRN: [id number]  Age / Sex: 62 y.o., female  PCP: Brandy Crock, NP Referring Physician: Chilton Greathouse, MD  Reason for Consultation: Establishing goals of care  HPI/Patient Profile: 62 y.o. female  with past medical history of ETOH use, asthma, chronic back pain, colitis, CIB, pancreatitis, and seizures admitted on 10/14/2022 with poor appetite and weakness. Patient found to have E coli bacteremia; STEC gastroenteritis. Severe AKI, no need for HD at this point. Has received some benzos during hospitalizations for withdrawal symptoms. PMT consulted for GOC discussions.   Clinical Assessment and Goals of Care: I have reviewed medical records including EPIC notes, labs and imaging, received report from RN, assessed the patient and then spoke with her brother Brandy Bishop to discuss diagnosis prognosis, GOC, EOL wishes, disposition and options.  I introduced Palliative Medicine as specialized medical care for people living with serious illness. It focuses on providing relief from the symptoms and stress of a serious illness. The goal is to improve quality of life for both the patient and the family.  Brandy Bishop tells me the patient's boyfriend Brandy Bishop lives with her. He tells me he and other siblings are next of kin. Brandy Bishop shares that patient drinks a lot and has poor appetite. He shares she is generally able to care for herself.    We discussed patient's current illness and what it means in the larger context of patient's on-going co-morbidities.  Prepared Brandy Bishop that Brandy Bishop is seriously ill and outcomes are uncertain. He understands.  Brandy Bishop shares this is the sickest Brandy Bishop have ever been and she has never discussed goals of care or thoughts on medical care in the past. He is unsure what she would want in current situation.   Discussed with Brandy Bishop  the importance of continued conversation with family and the medical providers regarding overall plan of care and treatment options, ensuring decisions are within the context of the patient's values and GOCs.    Brandy Bishop plans to come to the hospital tomorrow with other siblings and is open to family meeting at that time - will request weekend palliative provider reach out to family in AM.  Questions and concerns were addressed. The family was encouraged to call with questions or concerns.    Primary Decision Maker NEXT OF KIN    SUMMARY OF RECOMMENDATIONS   -patient unable to participate in conversation at this time r/t AMS; call to next of kin listed in chart (brother Brandy Bishop) who is unsure of goals of care - have never discussed this in the past - open to repeated discussions with PMT to include patient's siblings, will request weekend palliative provider reach out to [employer] [facility] [employer] tomorrow AM     Primary Diagnoses: Present on Admission:  Severe sepsis (HCC)   I have reviewed the medical record, interviewed the patient and family, and examined the patient. The following aspects are pertinent.  Past Medical History:  Diagnosis Date   Alcoholism (HCC) 2011   Asthma    Chronic lower back pain    Colitis 04/2014.   Bloody diarrhea   Daily headache    GIB (gastrointestinal bleeding)    Hypertension    Pancreatitis    Chronic pancreatitis noted on CT scan in 04/2014.   Polysubstance abuse (HCC) 2011   Pyelonephritis 05/2012   Seizures (HCC)    last seizure was 04/2019 per patient    Social History   Socioeconomic History  Marital status: Single    Spouse name: Not on file   Number of children: 0   Years of education: 73   Highest education level: Not on file  Occupational History   Occupation: disability  Tobacco Use   Smoking status: Never   Smokeless tobacco: Never  Vaping Use   Vaping status: Never Used  Substance and Sexual Activity   Alcohol use: Not Currently     Alcohol/week: 10.0 standard drinks of alcohol    Types: 10 Cans of beer per week    Comment: just occ a drink   Drug use: No   Sexual activity: Never  Other Topics Concern   Not on file  Social History Narrative   Lives in [place].    Disabled due to seizures.    Stay with a friend.    Single. No kids.    Social Determinants of Health   Financial Resource Strain: Not on File (05/24/2021)   Received from [employer], [place] Strain    Financial Resource Strain: 0  Food Insecurity: Patient Unable To Answer (10/17/2022)   Hunger Vital Sign    Worried About Running Out of Food in the Last Year: Patient unable to answer    Ran Out of Food in the Last Year: Patient unable to answer  Transportation Needs: Not on File (05/24/2021)   Received from [place], [employer] Needs    Transportation: 0  Physical Activity: Not on File (05/24/2021)   Received from [place], [place]   Physical Activity    Physical Activity: 0  Stress: Not on File (05/24/2021)   Received from [facility], [place]   Stress    Stress: 0  Social Connections: Not on File (10/17/2022)   Received from [facility]   Social Connections    Connectedness: 0   Family History  Problem Relation Age of Onset   Cancer Mother    Cancer Brother    Scheduled Meds:  Chlorhexidine Gluconate Cloth  6 each Topical Q0600   [START ON 10/19/2022] folic acid  1 mg Per Tube Daily   [START ON 10/19/2022] multivitamin with minerals  1 tablet Per Tube Daily   [START ON 10/22/2022] thiamine  100 mg Oral Daily   Continuous Infusions:  albumin human 25 g (10/18/22 1302)   cefTRIAXone (ROCEPHIN)  IV 2 g (10/18/22 0931)   dextrose 5% lactated ringers 60 mL/hr at 10/18/22 0426   feeding supplement (OSMOLITE 1.2 CAL)     thiamine (VITAMIN B1) injection Stopped (10/17/22 1445)   PRN Meds:.acetaminophen **OR** acetaminophen Allergies  Allergen Reactions   Penicillins Swelling    .Has patient had a PCN reaction causing immediate rash,  facial/tongue/throat swelling, SOB or lightheadedness with hypotension: Yes Has patient had a PCN reaction causing severe rash involving mucus membranes or skin necrosis: No Has patient had a PCN reaction that required hospitalization No Has patient had a PCN reaction occurring within the last 10 years: No If all of the above answers are "NO", then may proceed with Cephalosporin use.    Review of Systems  Unable to perform ROS: Mental status change    Physical Exam Constitutional:      Appearance: She is ill-appearing.     Comments: Patient grunts when I attempt to elicit response from her, does not follow commands or have any meaningful interaction, currently restrained at wrists  Cardiovascular:     Rate and Rhythm: Tachycardia present.  Pulmonary:     Effort: Pulmonary effort is normal.  Comments: On [place] Abdominal:     Palpations: Abdomen is soft.  Skin:    General: Skin is warm and dry.     Vital Signs: BP (!) 141/93   Pulse (!) 114   Temp 100 F (37.8 C) (Axillary)   Resp (!) 23   Ht 5\' 3"  (1.6 m)   Wt 48.2 kg   SpO2 99%   BMI 18.82 kg/m  Pain Scale: CPOT POSS *See Group Information*: 3-INTERVENTION REQUIRED,Unacceptable,Frequently drowsy, arousable, drifts off to sleep during conversation Pain Score: Asleep   SpO2: SpO2: 99 % O2 Device:SpO2: 99 % O2 Flow Rate: .O2 Flow Rate (L/min): 1.5 L/min  IO: Intake/output summary:  Intake/Output Summary (Last 24 hours) at 10/18/2022 1427 Last data filed at 10/18/2022 0800 Gross per 24 hour  Intake 443.39 ml  Output 2290 ml  Net -1846.61 ml    LBM: Last BM Date : 10/16/22 Baseline Weight: Weight: 39.8 kg Most recent weight: Weight: 48.2 kg        Time Total: 45 minutes Time spent includes: Detailed review of medical records (labs, imaging, vital signs), medically appropriate exam, discussion with treatment team, counseling and educating patient, family and/or staff, documenting clinical information, medication  management and coordination of care.    Gerlean Ren, DNP, AGNP-C Palliative Medicine Team [phone] Pager: [phone]

## 2022-10-18 NOTE — Progress Notes (Signed)
[facility] Progress Note   Subjective:   RRT for RR and tachyardia. Patient transferred to ICU overnight Hypoglycemia overnight, now on D5LR Patient continues to be somnolent, lethargic, and unarousable Per RN, no more Bms overnight but a BM smear with a blood clot present Foley with red urine and sediment present S/p platelet transfusion yesterday  Objective Vital signs in last 24 hours: Vitals:   10/18/22 0500 10/18/22 0600 10/18/22 0604 10/18/22 0813  BP: 120/73 126/76    Pulse: (!) 112 (!) 116 (!) 112   Resp: (!) 35 (!) 32 (!) 34   Temp:    (!) 101.3 F (38.5 C)  TempSrc:    Axillary  SpO2: 100% 90% 98%   Weight:      Height:       Weight change: -1.9 kg  Intake/Output Summary (Last 24 hours) at 10/18/2022 5956 Last data filed at 10/18/2022 0400 Gross per 24 hour  Intake 443.39 ml  Output 2300 ml  Net -1856.61 ml    Assessment/ Plan: Pt is a 62 y.o. yo female who was admitted on 10/14/2022 with chronic history of heavy alcohol use disorder c/b seizures, cirrhosis, chronic thrombocytopenia, HTN, MDD, admitted for sepsis secondary to E coli bacteremia now identified as STEC c/b by severe AKI, acute on chronic anemia, and profound thrombocytopenia concerning for HUS   Assessment/Plan: Sepsis secondary to Shiga-like toxin E coli bacteremia  Respiratory insufficiency, improving Pulmonary edema, improving -Sick patient with now confirmed STEC gastroenteritis, pyelonephritis and bacteremia on day 5 of CXT  therapy -Initially on continuous isotonic fluid administration, stopped for pulmonary edema now s/p 80->120 mg IV lasix. Her tachypnea and supplemental O2 requirement has not changed, however, significant improvement on CXR today after 2.6L utine output. Transferred to ICU 9/12, initially on 2L supp O2 now on RA -WBC improving, CXR unchanged.  -Hold any further lasix dosing at this time  AKI:  Suspect initially prerenal in setting of hypovolemia from poor  PO intake, ongoing GI losses, sepsis, and consistent use of antihypertensive medications. Concern for HUS given STEC diagnosis -Total UOP in 24h 2.5 Net negative 2L without lasix. No PO intake. Intake from IV carrier fluids and platelets -Mild increase in Cr today but improved from admission.  -GFR stable at 15 today -Severe hypoalbuminemia present. Now on D5LR as patient is NPO and  -Will continue to monitor GFR and Cr -No acute need for HD at this time. Will monitor UOP, volume status, and metabolic derangements as this patient is at high risk for decompensation and HD requirement -IVF resuscitation as above  Hypokalemia, 3.2 today Hypomagnesemia Hypophosphatemia -Now s/p 2x 10mE K chloride ordered -Normal Mg and phosphorus today -Will follow up RFP at 1200 for continued supplementation in setting of new furosemide requirement for  Normocytic anemia  Thrombocytopenia Shiga-like toxin associated E Coli HUS -Elevated LDH, elevated coag studies,elevated ddmer. However, normal fibrinogen and normal IPF. No schistocytes on smear thus far. Gross hematuria still present in foley catheter, increased from before. She has also had blood in stools today. No schistocytes seen on today's CBC. -Hgb today 7.8. -Hold of iron supplementation in setting of bacteremia -Will likely need another platelet transfusion as PLT <20K and bleeding -Transfuse for Hgb <7  Hepatic steatosisCirrhosis: No overt evidence of ascites on exam or imaging. LFTs stable.  EtOH use disorder  protein calorie malnutritionseizure disorder: Patient is likely withdrawing at this time, which is contributing to VS and clinical presentation. Continue on thiamine, CIWA with ativan.  May need Cortak as platelets allow.    Morene Crocker    Labs: Basic Metabolic Panel: Recent Labs  Lab 10/17/22 0455 10/17/22 1157 10/17/22 2100 10/17/22 2147 10/18/22 0259  NA 138 135 138 138 137  K 3.3* 3.6 3.5 3.6 3.2*  CL 95* 97*  96*  --  99  CO2 27 26 28   --  27  GLUCOSE 80 70 72  --  95  BUN 63* 69* 72*  --  73*  CREATININE 3.40* 3.48* 3.57*  --  3.61*  CALCIUM 8.2* 8.1* 8.1*  --  8.0*  PHOS 4.1 4.6 4.6  --   --    Liver Function Tests: Recent Labs  Lab 10/16/22 0600 10/16/22 1200 10/17/22 1157 10/17/22 2100 10/18/22 0259  AST 63*  --   --  62* 58*  ALT 23  --   --  26 26  ALKPHOS 108  --   --  123 120  BILITOT 4.2*  --   --  4.3* 4.6*  PROT 4.6*  --   --  4.7* 4.9*  ALBUMIN 1.6*   < > 1.5* 1.5* 1.6*   < > = values in this interval not displayed.   No results for input(s): "LIPASE", "AMYLASE" in the last 168 hours. No results for input(s): "AMMONIA" in the last 168 hours. CBC: Recent Labs  Lab 10/14/22 0909 10/15/22 0540 10/16/22 0600 10/16/22 2012 10/17/22 0455 10/17/22 2100 10/17/22 2147 10/18/22 0259  WBC 17.2*   < > 12.4* 14.7* 15.7* 16.3*  --  14.4*  NEUTROABS 15.1*  --  10.4*  --   --   --   --  11.4*  HGB 10.2*   < > 7.2* 7.6* 7.8* 7.0* 12.9 7.2*  HCT 28.4*   < > 19.6* 20.3* 21.3* 19.8* 38.0 20.3*  MCV 95.0   < > 88.7 88.6 91.4 91.2  --  89.8  PLT 18*   < > 8* 9* 6* 21*  22*  --  17*   < > = values in this interval not displayed.   Cardiac Enzymes: Recent Labs  Lab 10/14/22 2134  CKTOTAL 541*   CBG: Recent Labs  Lab 10/17/22 2216 10/17/22 2328 10/18/22 0334 10/18/22 0559 10/18/22 0804  GLUCAP 144* 119* 70 80 95    Iron Studies:  No results for input(s): "IRON", "TIBC", "TRANSFERRIN", "FERRITIN" in the last 72 hours.  Studies/Results: DG CHEST PORT 1 VIEW  Result Date: 10/17/2022 CLINICAL DATA:  Tachypnea EXAM: PORTABLE CHEST 1 VIEW COMPARISON:  10/17/2022, 10/16/2022, 10/14/2022 FINDINGS: Low lung volumes. Hazy bibasilar airspace disease. No pleural effusion. Normal cardiac size. No pneumothorax IMPRESSION: Low lung volumes with hazy bibasilar airspace disease, atelectasis versus mild pneumonia Electronically Signed   By: Jasmine Pang M.D.   On: 10/17/2022 22:15    DG CHEST PORT 1 VIEW  Result Date: 10/17/2022 CLINICAL DATA:  Pulmonary edema EXAM: PORTABLE CHEST 1 VIEW COMPARISON:  10/16/2022 FINDINGS: The heart size and mediastinal contours are within normal limits. Similar appearance of minimal diffuse interstitial opacity. No new or focal airspace opacity. The visualized skeletal structures are unremarkable. IMPRESSION: Similar appearance of minimal diffuse interstitial opacity, consistent with edema or atypical/viral infection. No new or focal airspace opacity. Electronically Signed   By: Jearld Lesch M.D.   On: 10/17/2022 11:50   DG CHEST PORT 1 VIEW  Result Date: 10/16/2022 CLINICAL DATA:  Shortness of breath. EXAM: PORTABLE CHEST 1 VIEW COMPARISON:  Chest radiograph 10/14/2022. FINDINGS: Interval development of diffuse  bilateral reticular opacities, which may reflect pulmonary edema or atypical/viral infection. No pleural effusion or pneumothorax. Stable cardiac and mediastinal contours. IMPRESSION: Interval development of diffuse bilateral reticular opacities, which may reflect pulmonary edema or atypical/viral infection. Electronically Signed   By: Orvan Falconer M.D.   On: 10/16/2022 10:42   Medications: Infusions:  cefTRIAXone (ROCEPHIN)  IV Stopped (10/17/22 1000)   dextrose 5% lactated ringers 60 mL/hr at 10/18/22 0426   thiamine (VITAMIN B1) injection Stopped (10/17/22 1445)    Scheduled Medications:  Chlorhexidine Gluconate Cloth  6 each Topical Q0600   folic acid  1 mg Oral Daily   multivitamin with minerals  1 tablet Oral Daily   [START ON 10/22/2022] thiamine  100 mg Oral Daily   I have reviewed scheduled and prn medications.  Physical Exam: General: woman resting in bed in NAD HEENT: dry mucus membranes with increased mouth breathing, dry blood on gum line Heart: Tachycardic, no murmurs present Lungs: Persistent increased work of breathing, speaking in short sentences. Crackles on BL lung bases present on RA Abdomen: Non  tender, mildly distended. Hyperactive bowel sounds present today Extremities:  Warm and dry. No pitting edema Neuro: Somnolent GU: Foley catheter present, red urine in the catheter with red sediment Skin: no rashes, petechiae, or ecchymoses today   10/18/2022,8:14 AM  LOS: 4 days

## 2022-10-18 NOTE — Progress Notes (Signed)
Hematology/Oncology Consult Progress Note  Clinical Summary: [name] is a 62 year old female with medical history significant for polysubstance abuse and cirrhosis with chronically low platelets who presents with severe thrombocytopenia in the setting of severe sepsis secondary to E. coli bacteremia.  Reason for Consult: Severe Thrombocytopenia  Interval History: -- stool culture returned with Shiga-like toxin producing E. Coli --clinic condition is worsening, patient transferred to ICU -- labs today show WBC 14.9, Hgb 5.4, MCV 90.2, Plt 12 --patient continues to have AMS.  --Palliative care following, GOC discussions underway   O:  Vitals:   10/18/22 1600 10/18/22 1700  BP: (!) 153/83 (!) 165/67  Pulse: (!) 119 (!) 114  Resp: (!) 22 (!) 44  Temp:    SpO2: 98% 95%      Latest Ref Rng & Units 10/18/2022    1:05 PM 10/18/2022    2:59 AM 10/17/2022    9:47 PM  CMP  Glucose 70 - 99 mg/dL 956  95    BUN 8 - 23 mg/dL 69  73    Creatinine 2.13 - 1.00 mg/dL 0.86  5.78    Sodium 469 - 145 mmol/L 138  137  138   Potassium 3.5 - 5.1 mmol/L 3.2  3.2  3.6   Chloride 98 - 111 mmol/L 98  99    CO2 22 - 32 mmol/L 27  27    Calcium 8.9 - 10.3 mg/dL 7.9  8.0    Total Protein 6.5 - 8.1 g/dL  4.9    Total Bilirubin 0.3 - 1.2 mg/dL 4.6  4.6    Alkaline Phos 38 - 126 U/L  120    AST 15 - 41 U/L  58    ALT 0 - 44 U/L  26        Latest Ref Rng & Units 10/18/2022    2:59 AM 10/17/2022    9:47 PM 10/17/2022    9:00 PM  CBC  WBC 4.0 - 10.5 K/uL 14.4   16.3   Hemoglobin 12.0 - 15.0 g/dL 7.2  62.9  7.0   Hematocrit 36.0 - 46.0 % 20.3  38.0  19.8   Platelets 150 - 400 K/uL 17   22    21       Assessment/Plan:  # Severe Thrombocytopenia # Shiga-like toxin producing E. Coli infection  -- At this time findings are consistent with a acute worsening of her thrombocytopenia due to critical illness/inflammation.  --GI pathogen panel shows concern for Shiga-like toxin producing E  coli. This has raised concern for aHUS --patient does not have clear signs of hemolytic anemia, though she does have severe thrombocytopenia and renal dysfunction --another clear etiology for her symptoms has not been found. Recommend consideration of eculiuzumab. Discussed with pulmonology with tenative plan to start tomorrow.  --Pharmacy notified, eculizumab 900 mg (initial dose requirement) on hand to start tomorrow.  -- Patient has a baseline platelet count of approximately 60-70 due to her liver disease.   -- Initial labs show an immature platelet fraction of 7.3%, implying marrow suppression.  Reticulocytes are also blunted at 0.6%. -- Initial nutritional evaluation showed vitamin B12 of 894 as well as folate of 13.5.  Unlikely her thrombocytopenia secondary to nutritional deficiency. -- CT chest abdomen pelvis without contrast on 10/14/2022 showed hepatic steatosis with no evidence of splenomegaly. -- No evidence of hemolysis with modest LDH, haptoglobin WNL.  Bilirubin at 4.2, though baseline is elevated.  -- Recommend platelet transfusion for platelets less than  10 or if patient is bleeding platelets less than 20. --No clear indication for bone marrow biopsy at this time. -- Hematology service will continue to follow.  Ulysees Barns, MD Department of Hematology/Oncology [facility] Phone: [phone] Pager: [phone] Email: [email or web address]

## 2022-10-18 NOTE — Progress Notes (Addendum)
NAME:  Brandy Bishop, MRN:  [id number], DOB:  May 25, 1960, LOS: 4 ADMISSION DATE:  10/14/2022, CONSULTATION DATE:  10/14/2022 REFERRING MD:  Jeraldine Loots - EDP, CHIEF COMPLAINT:  Weakness, dehydration   History of Present Illness:   Brandy Bishop is a 62 y.o. female who has a PMH as below. She presented to [facility] ED 9/9 with weakness and aches. She has had decreased PO intake x 2 - 3 days due to no appetite and weakness. She denies any recent fevers/chills/sweats, chest pain, dyspnea, abd pain, N/V/D, abd pain, myalgias.  She was found to have new AKI, hypokalemia, hyponatremia, elevated lactate, AGMA. She was also significantly dehydrated on ED presentation.  BP remained stable throughout. PCCM asked to see in consultation given AKI, lactic acidosis.  PCCM reconsulted 9/11 for increased WOB, tachypnea new O2 requirement.  Pertinent Medical History:   Past Medical History:  Diagnosis Date   Alcoholism (HCC) 2011   Asthma    Chronic lower back pain    Colitis 04/2014.   Bloody diarrhea   Daily headache    GIB (gastrointestinal bleeding)    Hypertension    Pancreatitis    Chronic pancreatitis noted on CT scan in 04/2014.   Polysubstance abuse (HCC) 2011   Pyelonephritis 05/2012   Seizures (HCC)    last seizure was 04/2019 per patient    Significant Hospital Events: Including procedures, antibiotic start and stop dates in addition to other pertinent events   9/9 Admitted. PCCM consulted for possible ICU needs, given hypotension. Fortunately fluid responsive. AKI presumed prerenal in the setting of dehydration/UTI? BCx resulted +E.coli. Ceftriaxone. 9/11 PCCM called back for increased WOB, tachypnea to 30s-40s. CXR wet-appearing with +pulmonary edema.  On Lasix diuresis 9/12 stool test positive for STEC, STEC-HUS being considered  Interim History/Subjective:   Transferred to ICU for worsening respiratory distress and somnolence  Objective:  Blood pressure 126/76, pulse (!) 112,  temperature 99.4 F (37.4 C), temperature source Axillary, resp. rate (!) 34, height 5\' 3"  (1.6 m), weight 48.2 kg, SpO2 98%.        Intake/Output Summary (Last 24 hours) at 10/18/2022 0722 Last data filed at 10/18/2022 0400 Gross per 24 hour  Intake 443.39 ml  Output 2550 ml  Net -2106.61 ml   Filed Weights   10/17/22 0500 10/17/22 2100 10/18/22 0306  Weight: 49.5 kg 47.6 kg 48.2 kg   Physical Examination: Gen:      No acute distress, cachectic, chronically ill-appearing HEENT:  EOMI, sclera anicteric Neck:     No masses; no thyromegaly Lungs:    Tachypneic CV:         Regular rate and rhythm; no murmurs Abd:      + bowel sounds; soft, non-tender; no palpable masses, no distension Ext:    No edema; adequate peripheral perfusion Skin:      Warm and dry; no rash Neuro: Moans to questions and on stimulation.  Moving all extremities.  Lab/imaging reviewed Significant for potassium 3.2, BUN/creatinine 73/3.61 AST 58, ALT 26, total bilirubin 4.6 WBC improved to 14.4, hemoglobin 7.2, platelets 17 ABG with pH 7.51, pCO2 40, pO2 83 Chest x-ray is unchanged with hazy bibasilar opacities.  Assessment & Plan:  Respiratory distress Has been diuresed adequately with no improvement in status Chest x-ray is unchanged Will keep net I's/O even today and hold further doses of Lasix  Sepsis secondary to E. coli bacteremia STEC gastro enteritis Continue ceftriaxone as the E. 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 syndrome due to STEC from stool studies Will consult hematology again to comment  Chronic thrombocytopenia 2/2 EtOH and hepatic steatosis Follow platelets, transfuse as needed  EtOH abuse history - Monitor for signs/symptoms of withdrawal - Limit benzos as she is well sedated - Continue thiamine - MV as able  Severe malnutrition -  Plan for Cortak placement and feeds today  Best practice (evaluated daily):   Diet/type: NPO DVT prophylaxis: SCD, Holding chemical prophylaxis due to thrombocytopenia GI prophylaxis: N/A Lines: Central line Foley:  Yes, and it is still needed Code Status:  full code Last date of multidisciplinary goals of care discussion []   Critical care time:    The patient is critically ill with multiple organ system failure and requires high complexity decision making for assessment and support, frequent evaluation and titration of therapies, advanced monitoring, review of radiographic studies and interpretation of complex data.   Critical Care Time devoted to patient care services, exclusive of separately billable procedures, described in this note is 35 minutes.   Chilton Greathouse MD [facility] See Amion for pager  If no response to pager , please call [phone] until 7pm After 7:00 pm call Elink  [phone] 10/18/2022, 8:11 AM

## 2022-10-18 NOTE — Procedures (Addendum)
Cortrak  Person Inserting Tube:  Greig Castilla D, RD Tube Type:  Cortrak - 43 inches Tube Size:  10 Tube Location:  Left nare Secured by: Bridle Technique Used to Measure Tube Placement:  Marking at nare/corner of mouth Cortrak Secured At:  65 cm Procedure Comments:  Cortrak Tube Team Note:  Consult received to place a Cortrak feeding tube. Platelets low, ok per Dr Isaiah Serge to proceed with placement.   X-ray is required, abdominal x-ray has been ordered by the Cortrak team. Please confirm tube placement before using the Cortrak tube.   If the tube becomes dislodged please keep the tube and contact the Cortrak team at [email or web address] for replacement.  If after hours and replacement cannot be delayed, place a NG tube and confirm placement with an abdominal x-ray.    Greig Castilla, RD, LDN Clinical Dietitian RD pager # available in AMION  After hours/weekend pager # available in [facility]

## 2022-10-18 NOTE — Plan of Care (Signed)
Problem: Clinical Measurements: Goal: Ability to maintain clinical measurements within normal limits will improve Outcome: Progressing   Problem: Nutrition: Goal: Adequate nutrition will be maintained Outcome: Progressing   Problem: Safety: Goal: Ability to remain free from injury will improve Outcome: Progressing   Problem: Skin Integrity: Goal: Risk for impaired skin integrity will decrease Outcome: Progressing

## 2022-10-18 NOTE — Progress Notes (Signed)
Elink notified regarding glucose trending down and platelet <17. D5LR ordered and started. Replacing K per order.

## 2022-10-19 DIAGNOSIS — A419 Sepsis, unspecified organism: Secondary | ICD-10-CM | POA: Diagnosis not present

## 2022-10-19 DIAGNOSIS — R6520 Severe sepsis without septic shock: Secondary | ICD-10-CM | POA: Diagnosis not present

## 2022-10-19 LAB — CBC WITH DIFFERENTIAL/PLATELET
Abs Immature Granulocytes: 0 10*3/uL (ref 0.00–0.07)
Basophils Absolute: 0 10*3/uL (ref 0.0–0.1)
Basophils Relative: 0 %
Eosinophils Absolute: 0 10*3/uL (ref 0.0–0.5)
Eosinophils Relative: 0 %
HCT: 23.8 % — ABNORMAL LOW (ref 36.0–46.0)
Hemoglobin: 8.6 g/dL — ABNORMAL LOW (ref 12.0–15.0)
Lymphocytes Relative: 9 %
Lymphs Abs: 1.6 10*3/uL (ref 0.7–4.0)
MCH: 31.9 pg (ref 26.0–34.0)
MCHC: 36.1 g/dL — ABNORMAL HIGH (ref 30.0–36.0)
MCV: 88.1 fL (ref 80.0–100.0)
Monocytes Absolute: 0.9 10*3/uL (ref 0.1–1.0)
Monocytes Relative: 5 %
Neutro Abs: 15.4 10*3/uL — ABNORMAL HIGH (ref 1.7–7.7)
Neutrophils Relative %: 86 %
Platelets: 30 10*3/uL — ABNORMAL LOW (ref 150–400)
RBC: 2.7 MIL/uL — ABNORMAL LOW (ref 3.87–5.11)
RDW: 15.3 % (ref 11.5–15.5)
WBC: 17.9 10*3/uL — ABNORMAL HIGH (ref 4.0–10.5)
nRBC: 0 % (ref 0.0–0.2)
nRBC: 0 /100{WBCs}

## 2022-10-19 LAB — GLUCOSE, CAPILLARY
Glucose-Capillary: 107 mg/dL — ABNORMAL HIGH (ref 70–99)
Glucose-Capillary: 128 mg/dL — ABNORMAL HIGH (ref 70–99)
Glucose-Capillary: 140 mg/dL — ABNORMAL HIGH (ref 70–99)
Glucose-Capillary: 143 mg/dL — ABNORMAL HIGH (ref 70–99)
Glucose-Capillary: 153 mg/dL — ABNORMAL HIGH (ref 70–99)
Glucose-Capillary: 89 mg/dL (ref 70–99)

## 2022-10-19 LAB — COMPREHENSIVE METABOLIC PANEL
ALT: 27 U/L (ref 0–44)
AST: 65 U/L — ABNORMAL HIGH (ref 15–41)
Albumin: 2.3 g/dL — ABNORMAL LOW (ref 3.5–5.0)
Alkaline Phosphatase: 147 U/L — ABNORMAL HIGH (ref 38–126)
Anion gap: 16 — ABNORMAL HIGH (ref 5–15)
BUN: 72 mg/dL — ABNORMAL HIGH (ref 8–23)
CO2: 27 mmol/L (ref 22–32)
Calcium: 8.4 mg/dL — ABNORMAL LOW (ref 8.9–10.3)
Chloride: 98 mmol/L (ref 98–111)
Creatinine, Ser: 3.61 mg/dL — ABNORMAL HIGH (ref 0.44–1.00)
GFR, Estimated: 14 mL/min — ABNORMAL LOW (ref >60)
Glucose, Bld: 121 mg/dL — ABNORMAL HIGH (ref 70–99)
Potassium: 3.5 mmol/L (ref 3.5–5.1)
Sodium: 141 mmol/L (ref 135–145)
Total Bilirubin: 6.2 mg/dL — ABNORMAL HIGH (ref 0.3–1.2)
Total Protein: 5.5 g/dL — ABNORMAL LOW (ref 6.5–8.1)

## 2022-10-19 LAB — PHOSPHORUS
Phosphorus: 3.5 mg/dL (ref 2.5–4.6)
Phosphorus: 2.8 mg/dL (ref 2.5–4.6)

## 2022-10-19 LAB — CULTURE, BLOOD (ROUTINE X 2)
Culture: NO GROWTH
Special Requests: ADEQUATE

## 2022-10-19 LAB — HEMOGLOBIN AND HEMATOCRIT, BLOOD
HCT: 21.5 % — ABNORMAL LOW (ref 36.0–46.0)
Hemoglobin: 7.8 g/dL — ABNORMAL LOW (ref 12.0–15.0)

## 2022-10-19 LAB — MAGNESIUM
Magnesium: 1.7 mg/dL (ref 1.7–2.4)
Magnesium: 1.6 mg/dL — ABNORMAL LOW (ref 1.7–2.4)

## 2022-10-19 MED ORDER — LABETALOL HCL 5 MG/ML IV SOLN
10.0000 mg | INTRAVENOUS | Status: DC | PRN
  Administered 2022-10-19 – 2022-10-25 (×6): 10 mg via INTRAVENOUS
  Filled 2022-10-19 (×7): qty 4

## 2022-10-19 MED ORDER — SODIUM CHLORIDE 0.9 % IV SOLN
900.0000 mg | Freq: Once | INTRAVENOUS | Status: AC
  Administered 2022-10-19: 900 mg via INTRAVENOUS
  Filled 2022-10-19: qty 90

## 2022-10-19 MED ORDER — FENTANYL CITRATE PF 50 MCG/ML IJ SOSY
25.0000 ug | PREFILLED_SYRINGE | INTRAMUSCULAR | Status: DC | PRN
  Administered 2022-10-19 (×3): 25 ug via INTRAVENOUS
  Filled 2022-10-19 (×3): qty 1

## 2022-10-19 NOTE — Progress Notes (Signed)
Daily Progress Note   Patient Name: Brandy Bishop       Date: 10/19/2022 DOB: [birth year]  Age: 62 y.o. MRN#: [id number] Attending Physician: Chilton Greathouse, MD Primary Care Physician: Cline Crock, NP Admit Date: 10/14/2022  Reason for Consultation/Follow-up: Establishing goals of care  Length of Stay: 5  Current Medications: Scheduled Meds:   Chlorhexidine Gluconate Cloth  6 each Topical Q0600   folic acid  1 mg Per Tube Daily   multivitamin with minerals  1 tablet Per Tube Daily   mouth rinse  15 mL Mouth Rinse 4 times per day   [START ON 10/22/2022] thiamine  100 mg Oral Daily    Continuous Infusions:  cefTRIAXone (ROCEPHIN)  IV Stopped (10/19/22 1119)   dextrose 5% lactated ringers 60 mL/hr at 10/19/22 1200   feeding supplement (OSMOLITE 1.2 CAL) 25 mL/hr at 10/19/22 1200   thiamine (VITAMIN B1) injection 100 mL/hr at 10/19/22 1200    PRN Meds: acetaminophen **OR** acetaminophen, hydrALAZINE, labetalol, mouth rinse  Physical Exam Vitals reviewed.  Constitutional:      General: She is sleeping.     Appearance: She is ill-appearing.     Interventions: Nasal cannula in place.     Comments: Does not follow commands  Pulmonary:     Effort: Tachypnea present.             Vital Signs: BP 132/84 (BP Location: Right Arm)   Pulse (!) 110   Temp 99 F (37.2 C) (Axillary)   Resp (!) 34   Ht 5\' 3"  (1.6 m)   Wt 45.6 kg   SpO2 100%   BMI 17.81 kg/m  SpO2: SpO2: 100 % O2 Device: O2 Device: Nasal Cannula O2 Flow Rate: O2 Flow Rate (L/min): 2 L/min    Patient Active Problem List   Diagnosis Date Noted   Sepsis due to Escherichia coli with encephalopathy without septic shock (HCC) 10/17/2022   Acute metabolic encephalopathy 10/17/2022   Severe sepsis (HCC)  10/14/2022   Hypovolemia 10/14/2022   Dehydration 10/14/2022   AKI (acute kidney injury) (HCC) 10/14/2022   CAP (community acquired pneumonia) 06/30/2022   Colon cancer screening 08/10/2020   Acute liver failure 06/08/2020   Acute GI bleeding 09/08/2019   Polysubstance abuse (HCC) 09/08/2019   Gastroesophageal reflux disease with esophagitis    Seizure (  HCC) 05/30/2015   Cough    Subtherapeutic phenytoin level    Acute upper GI bleed    Hypomagnesemia 04/16/2014   Hypokalemia 04/16/2014   Thrombocytopenia (HCC) 04/16/2014   Liver disease 04/15/2014   ETOH abuse 04/15/2014   Chronic pancreatitis (HCC) 04/15/2014   Protein-calorie malnutrition, severe (HCC) 04/15/2014   Severe protein-calorie malnutrition (HCC) 04/15/2014   Bleeding gastrointestinal    Abnormal CT scan, colon    Rectal bleeding    Abdominal pain    Alcoholic hepatitis without ascites    GI bleed 04/14/2014   Viral URI with cough 05/07/2012   Pyelonephritis 05/07/2012   Seizure disorder (HCC) 05/07/2012   Asthma with bronchitis 05/07/2012   HTN (hypertension) 05/07/2012    Palliative Care Assessment & Plan   Patient Profile: 62 y.o. female  with past medical history of ETOH use, asthma, chronic back pain, colitis, CIB, pancreatitis, and seizures admitted on 10/14/2022 with poor appetite and weakness. Patient found to have E coli bacteremia; STEC gastroenteritis. Severe AKI, no need for HD at this point. Has received some benzos during hospitalizations for withdrawal symptoms. PMT consulted for GOC discussions.    Today's Discussion: I have reviewed medical records including EPIC notes, labs and imaging, received report from RN, assessed the patient and then met with several siblings and cousins of the patient to discuss diagnosis prognosis, GOC, EOL wishes, disposition and options.   I introduced Palliative Medicine as specialized medical care for people living with serious illness. It focuses on providing  relief from the symptoms and stress of a serious illness. The goal is to improve quality of life for both the patient and the family.   Patient was private about her medical conditions so the family did not know of her chronic conditions. She never discussed advanced care planning or goals of care with her family. We discussed the patient's acute illness and her poor prognosis.  Recommended consideration of DNR status, understanding evidenced-based poor outcomes in similar hospitalized patients, as the cause of the arrest is likely associated with chronic/terminal disease rather than a reversible acute cardio-pulmonary event. Patient code status changed to DNR.  We discussed scope of care and the family decided the patient would not want to be intubated. They also agreed to not escalate care-- DO NOT start vasopressors and DO NOT initiate dialysis. They do want to continue to treat the treatable and allow time for outcomes. If the patient continues to not improve or worsen they would want to meet again to discuss next steps including comfort measures.  In her life the patient was very independent. She enjoyed dancing and playing cards. The family is making decisions based on what they think the patient would choose for herself. She has a large family/ support system. The patient and her family are spiritual and are praying for her recovery. It is very important to them that the patient does not suffer. Her brother Thersa Salt [phone] will remain the point of contact for the family.   Recommendations/Plan: Change code status to DNR Do not intubate, do not start vasopressors, do not initiate dialysis Treat the treatable Time for outcomes Patient's brother Thersa Salt [phone] will remain the point of contact for the family. Spiritual care consult Continued PMT support   Code Status:    Code Status Orders  (From admission, onward)           Start     Ordered    10/19/22 1308  Do not attempt  resuscitation (DNR)- Limited -Do Not Intubate (DNI)  (Code Status)  Continuous       Question Answer Comment  If pulseless and not breathing No CPR or chest compressions.   In Pre-Arrest Conditions (Patient Is Breathing and Has A Pulse) Do not intubate. Provide all appropriate non-invasive medical interventions. Avoid ICU transfer unless indicated or required.   Consent: Discussion documented in EHR or advanced directives reviewed      10/19/22 1307             Care plan was discussed with bedside RN and Dr. Isaiah Serge  Time spent: 80 minutes  Thank you for allowing the Palliative Medicine Team to assist in the care of this patient.     Sherryll Burger, NP  Please contact Palliative Medicine Team phone at [phone] for questions and concerns.

## 2022-10-19 NOTE — Progress Notes (Signed)
eLink Physician-Brief Progress Note Patient Name: Brandy Bishop DOB: Oct 15, 1960 MRN: [id number]   Date of Service  10/19/2022  HPI/Events of Note  Hemoglobin 8.6 gm / dl on CBC taken after one unit of PRBC transfused.  eICU Interventions  Okay to hold second unit and send stat H & H to verify hemoglobin results.        Thomasene Lot Calle Schader 10/19/2022, 4:04 AM

## 2022-10-19 NOTE — Progress Notes (Signed)
Pola Corn, RN notified of increased heart rate in the 120s and SBP in the 160-170s. Messaged relayed to MD. PRN Labetalol ordered with SBP parameters. Orders to obtain CBC around 2am to determine if second ordered unit of blood will be given.

## 2022-10-19 NOTE — Progress Notes (Signed)
[facility] NEPHROLOGY PROGRESS NOTE  Assessment/ Plan: Pt is a 62 y.o. yo female with medical history of alcohol abuse, liver cirrhosis, thrombocytopenia, hypertension, MDD who was admitted on 9/9 with seizure, sepsis due to E. coli bacteremia and AKI.  # Sepsis secondary due to Shiga like toxin E. coli bacteremia: Currently on ceftriaxone and supportive treatment.  In ICU.  # Nonoliguric AKI likely ischemic ATN in the setting of sepsis/ongoing GI loss, poor oral intake with intravascular depletion.  Concern for HUS given STEC diagnosis. Continue to hold diuretics, agree with gentle IV hydration. Creatinine level is declining, hard to assess uremic symptoms because of multiple other reasons for encephalopathy.  I have discussed with the patient's husband and granddaughter about declining GFR and possible preparation for dialysis.  I also informed that she may not tolerate outpatient dialysis because of multiple comorbidities.  Recommend palliative care consult.  # Shiga like toxin associated with E. coli bacteremia: Concern for HUS.  Seen by hematologist and starting on eculizumab.  Meningococcal vaccination before the infusion.  # Alcohol abuse, liver cirrhosis, protein calorie malnutrition/seizure: Currently on thiamine, CIWA protocol in ICU.  Remains encephalopathic.  # Severe anemia, thrombocytopenia: PRBC transfusion.  Monitor lab.  Discussed with ICU provider, nurses and family members.  Patient is critically ill.  Subjective: Seen and examined ICU.  Patient is delirious and confused.  Urine output is around 1.9 L.  She was febrile early this morning.  Noted plan for eculizumab infusion.  Patient's husband and granddaughter was presented at the bedside.  The granddaughter's husband is on dialysis therefore she has more knowledge about dialysis etc.  Objective Vital signs in last 24 hours: Vitals:   10/19/22 0600 10/19/22 0700 10/19/22 0800 10/19/22 0900  BP: 106/62 (!)  119/96 124/85 128/65  Pulse: (!) 120 (!) 123 (!) 106 (!) 111  Resp: (!) 30 (!) 34 (!) 39 (!) 36  Temp:  100.1 F (37.8 C)    TempSrc:  Axillary    SpO2: 97% 98% 95% 98%  Weight:      Height:       Weight change: -2 kg  Intake/Output Summary (Last 24 hours) at 10/19/2022 1016 Last data filed at 10/19/2022 0900 Gross per 24 hour  Intake 2794.93 ml  Output 1690 ml  Net 1104.93 ml       Labs: RENAL PANEL Recent Labs  Lab 10/17/22 0455 10/17/22 1157 10/17/22 2100 10/17/22 2147 10/18/22 0259 10/18/22 1305 10/18/22 1756 10/18/22 1817 10/19/22 0211  NA 138 135 138 138 137 138 141  --  141  K 3.3* 3.6 3.5 3.6 3.2* 3.2* 3.1*  --  3.5  CL 95* 97* 96*  --  99 98  --   --  98  CO2 27 26 28   --  27 27  --   --  27  GLUCOSE 80 70 72  --  95 235*  --   --  121*  BUN 63* 69* 72*  --  73* 69*  --   --  72*  CREATININE 3.40* 3.48* 3.57*  --  3.61* 3.41*  --   --  3.61*  CALCIUM 8.2* 8.1* 8.1*  --  8.0* 7.9*  --   --  8.4*  MG 2.4  --  2.1  --   --  1.8  --  1.7 1.7  PHOS 4.1 4.6 4.6  --   --  3.0  --  2.7 3.5  ALBUMIN  --  1.5* 1.5*  --  1.6* 1.7*  --   --  2.3*    Liver Function Tests: Recent Labs  Lab 10/17/22 2100 10/18/22 0259 10/18/22 1305 10/19/22 0211  AST 62* 58*  --  65*  ALT 26 26  --  27  ALKPHOS 123 120  --  147*  BILITOT 4.3* 4.6* 4.6* 6.2*  PROT 4.7* 4.9*  --  5.5*  ALBUMIN 1.5* 1.6* 1.7* 2.3*   No results for input(s): "LIPASE", "AMYLASE" in the last 168 hours. No results for input(s): "AMMONIA" in the last 168 hours. CBC: Recent Labs    10/15/22 0540 10/15/22 2035 10/16/22 0600 10/18/22 0259 10/18/22 1756 10/18/22 1817 10/19/22 0211 10/19/22 0445  HGB 8.0* 7.2*   < > 7.2* 6.1* 5.4* 8.6* 7.8*  MCV 91.6 90.6   < > 89.8  --  90.2 88.1  --   VITAMINB12 894  --   --   --   --   --   --   --   FOLATE 13.5  --   --   --   --   --   --   --   FERRITIN 590*  --   --   --   --   --   --   --   TIBC 126*  --   --   --   --   --   --   --   IRON 6*  --    --   --   --   --   --   --   RETICCTPCT  --  0.6  --   --   --   --   --   --    < > = values in this interval not displayed.    Cardiac Enzymes: Recent Labs  Lab 10/14/22 2134  CKTOTAL 541*   CBG: Recent Labs  Lab 10/18/22 1537 10/18/22 1929 10/18/22 2308 10/19/22 0316 10/19/22 0716  GLUCAP 101* 136* 113* 107* 153*    Iron Studies: No results for input(s): "IRON", "TIBC", "TRANSFERRIN", "FERRITIN" in the last 72 hours. Studies/Results: DG Abd Portable 1V  Result Date: 10/18/2022 CLINICAL DATA:  Feeding tube placement. EXAM: PORTABLE ABDOMEN - 1 VIEW COMPARISON:  October 14, 2022. FINDINGS: Distal tip of feeding tube is seen in expected position of distal stomach. Small bowel dilatation is noted. IMPRESSION: Distal tip of feeding tube is seen in expected position of distal stomach. Small bowel dilatation is noted. Electronically Signed   By: Lupita Raider M.D.   On: 10/18/2022 13:07   DG CHEST PORT 1 VIEW  Result Date: 10/17/2022 CLINICAL DATA:  Tachypnea EXAM: PORTABLE CHEST 1 VIEW COMPARISON:  10/17/2022, 10/16/2022, 10/14/2022 FINDINGS: Low lung volumes. Hazy bibasilar airspace disease. No pleural effusion. Normal cardiac size. No pneumothorax IMPRESSION: Low lung volumes with hazy bibasilar airspace disease, atelectasis versus mild pneumonia Electronically Signed   By: Jasmine Pang M.D.   On: 10/17/2022 22:15   DG CHEST PORT 1 VIEW  Result Date: 10/17/2022 CLINICAL DATA:  Pulmonary edema EXAM: PORTABLE CHEST 1 VIEW COMPARISON:  10/16/2022 FINDINGS: The heart size and mediastinal contours are within normal limits. Similar appearance of minimal diffuse interstitial opacity. No new or focal airspace opacity. The visualized skeletal structures are unremarkable. IMPRESSION: Similar appearance of minimal diffuse interstitial opacity, consistent with edema or atypical/viral infection. No new or focal airspace opacity. Electronically Signed   By: Jearld Lesch M.D.   On:  10/17/2022 11:50    Medications: Infusions:  cefTRIAXone (ROCEPHIN)  IV Stopped (10/18/22 1001)   dextrose 5% lactated ringers 60 mL/hr at 10/19/22 0900   eculizumab (SOLIRIS) 900 mg in sodium chloride 0.9 % 90 mL infusion     feeding supplement (OSMOLITE 1.2 CAL) 25 mL/hr at 10/19/22 0900   thiamine (VITAMIN B1) injection Stopped (10/18/22 1544)    Scheduled Medications:  Chlorhexidine Gluconate Cloth  6 each Topical Q0600   folic acid  1 mg Per Tube Daily   multivitamin with minerals  1 tablet Per Tube Daily   mouth rinse  15 mL Mouth Rinse 4 times per day   [START ON 10/22/2022] thiamine  100 mg Oral Daily    have reviewed scheduled and prn medications.  Physical Exam: General: Critically ill looking female, delirious and confused. Heart:RRR, s1s2 nl Lungs: Basal decreased breath sound. Abdomen:soft, Non-tender, non-distended Extremities:No edema Neurology: Encephalopathic and delirious.  Jackey Housey Prasad Valincia Touch 10/19/2022,10:16 AM  LOS: 5 days

## 2022-10-19 NOTE — Progress Notes (Signed)
NAME:  Brandy Bishop, MRN:  [id number], DOB:  1960-10-23, LOS: 5 ADMISSION DATE:  10/14/2022, CONSULTATION DATE:  10/14/2022 REFERRING MD:  Jeraldine Loots - EDP, CHIEF COMPLAINT:  Weakness, dehydration   History of Present Illness:   Brandy Bishop is a 62 y.o. female who has a PMH as below. She presented to [facility] ED 9/9 with weakness and aches. She has had decreased PO intake x 2 - 3 days due to no appetite and weakness. She denies any recent fevers/chills/sweats, chest pain, dyspnea, abd pain, N/V/D, abd pain, myalgias.  She was found to have new AKI, hypokalemia, hyponatremia, elevated lactate, AGMA. She was also significantly dehydrated on ED presentation.  BP remained stable throughout. PCCM asked to see in consultation given AKI, lactic acidosis.  PCCM reconsulted 9/11 for increased WOB, tachypnea new O2 requirement.  Pertinent Medical History:   Past Medical History:  Diagnosis Date   Alcoholism (HCC) 2011   Asthma    Chronic lower back pain    Colitis 04/2014.   Bloody diarrhea   Daily headache    GIB (gastrointestinal bleeding)    Hypertension    Pancreatitis    Chronic pancreatitis noted on CT scan in 04/2014.   Polysubstance abuse (HCC) 2011   Pyelonephritis 05/2012   Seizures (HCC)    last seizure was 04/2019 per patient    Significant Hospital Events: Including procedures, antibiotic start and stop dates in addition to other pertinent events   9/9 Admitted. PCCM consulted for possible ICU needs, given hypotension. Fortunately fluid responsive. AKI presumed prerenal in the setting of dehydration/UTI? BCx resulted +E.coli. Ceftriaxone. 9/11 PCCM called back for increased WOB, tachypnea to 30s-40s. CXR wet-appearing with +pulmonary edema.  On Lasix diuresis 9/12 stool test positive for STEC, STEC-HUS being considered  Interim History/Subjective:   Continues to be somnolent, tachypneic, tachycardic Given 1 unit PRBC and 1 platelet yesterday for low counts.  No evidence of  active bleeding.  Objective:  Blood pressure 124/85, pulse (!) 106, temperature 100.1 F (37.8 C), temperature source Axillary, resp. rate (!) 39, height 5\' 3"  (1.6 m), weight 45.6 kg, SpO2 95%.        Intake/Output Summary (Last 24 hours) at 10/19/2022 0852 Last data filed at 10/19/2022 0800 Gross per 24 hour  Intake 2919.43 ml  Output 1690 ml  Net 1229.43 ml   Filed Weights   10/17/22 2100 10/18/22 0306 10/19/22 0136  Weight: 47.6 kg 48.2 kg 45.6 kg   Physical Examination: Blood pressure 124/85, pulse (!) 106, temperature 100.1 F (37.8 C), temperature source Axillary, resp. rate (!) 39, height 5\' 3"  (1.6 m), weight 45.6 kg, SpO2 95%. Gen:      No acute distress, chronically ill-appearing HEENT:  EOMI, sclera anicteric Neck:     No masses; no thyromegaly Lungs:   Tachypnea CV:         Tachycardia Abd:      + bowel sounds; soft, non-tender; no palpable masses, no distension Ext:    No edema; adequate peripheral perfusion Skin:      Warm and dry; no rash Neuro: Moves all extremities, does not follow commands  Lab/imaging reviewed Significant for BUN/creatinine 72/2.61, alk phos 147, AST 65 Total bilirubin 6.2 Hemoglobin down to 7.8, platelets 30  Assessment & Plan:  Respiratory distress Has been diuresed adequately with no improvement in status Chest x-ray is unchanged Will keep net I's/O even today and hold further doses of Lasix  Sepsis secondary to E. coli bacteremia STEC gastro enteritis  Continue ceftriaxone as the E. coli is pansensitive Keep net even   AKI, likely prerenal in the setting of dehydration Hyponatremia AG/lactic acidosis Renal is following, no need for dialysis Possible HUS syndrome due to STEC from stool studies Although studies are not entirely consistent we will give a dose of eculizumab for aHUS.  Discussed with hematology  Chronic thrombocytopenia 2/2 EtOH and hepatic steatosis Follow platelets.  Transfuse for platelets less than 10 or if  she is bleeding  EtOH abuse history - Monitor for signs/symptoms of withdrawal - Limit benzos as she is well sedated - Continue thiamine - MV as able  Severe malnutrition -Titrate tube feeds  Goals of care Family wants everything done.  Palliative care is going to meet them.  Best practice (evaluated daily):   Diet/type: Tube feeds DVT prophylaxis: SCD, Holding chemical prophylaxis due to thrombocytopenia GI prophylaxis: N/A Lines: Central line Foley:  Yes, and it is still needed Code Status:  full code Last date of multidisciplinary goals of care discussion []   Critical care time:    The patient is critically ill with multiple organ system failure and requires high complexity decision making for assessment and support, frequent evaluation and titration of therapies, advanced monitoring, review of radiographic studies and interpretation of complex data.   Critical Care Time devoted to patient care services, exclusive of separately billable procedures, described in this note is 35 minutes.   Chilton Greathouse MD [facility] See Amion for pager  If no response to pager , please call [phone] until 7pm After 7:00 pm call Elink  [phone] 10/19/2022, 8:52 AM

## 2022-10-20 ENCOUNTER — Other Ambulatory Visit: Payer: Self-pay

## 2022-10-20 DIAGNOSIS — A419 Sepsis, unspecified organism: Secondary | ICD-10-CM | POA: Diagnosis not present

## 2022-10-20 DIAGNOSIS — R6520 Severe sepsis without septic shock: Secondary | ICD-10-CM | POA: Diagnosis not present

## 2022-10-20 LAB — PREPARE PLATELET PHERESIS: Unit division: 0

## 2022-10-20 LAB — CBC WITH DIFFERENTIAL/PLATELET
Abs Immature Granulocytes: 0.37 10*3/uL — ABNORMAL HIGH (ref 0.00–0.07)
Basophils Absolute: 0.1 10*3/uL (ref 0.0–0.1)
Basophils Relative: 0 %
Eosinophils Absolute: 0.1 10*3/uL (ref 0.0–0.5)
Eosinophils Relative: 0 %
HCT: 19.2 % — ABNORMAL LOW (ref 36.0–46.0)
Hemoglobin: 6.9 g/dL — CL (ref 12.0–15.0)
Immature Granulocytes: 2 %
Lymphocytes Relative: 9 %
Lymphs Abs: 2.1 10*3/uL (ref 0.7–4.0)
MCH: 31.9 pg (ref 26.0–34.0)
MCHC: 35.9 g/dL (ref 30.0–36.0)
MCV: 88.9 fL (ref 80.0–100.0)
Monocytes Absolute: 1.7 10*3/uL — ABNORMAL HIGH (ref 0.1–1.0)
Monocytes Relative: 8 %
Neutro Abs: 18.2 10*3/uL — ABNORMAL HIGH (ref 1.7–7.7)
Neutrophils Relative %: 81 %
Platelets: 19 10*3/uL — CL (ref 150–400)
RBC: 2.16 MIL/uL — ABNORMAL LOW (ref 3.87–5.11)
RDW: 16.2 % — ABNORMAL HIGH (ref 11.5–15.5)
WBC: 22.4 10*3/uL — ABNORMAL HIGH (ref 4.0–10.5)
nRBC: 0 % (ref 0.0–0.2)

## 2022-10-20 LAB — COMPREHENSIVE METABOLIC PANEL
ALT: 20 U/L (ref 0–44)
AST: 33 U/L (ref 15–41)
Albumin: 1.8 g/dL — ABNORMAL LOW (ref 3.5–5.0)
Alkaline Phosphatase: 123 U/L (ref 38–126)
Anion gap: 11 (ref 5–15)
BUN: 75 mg/dL — ABNORMAL HIGH (ref 8–23)
CO2: 28 mmol/L (ref 22–32)
Calcium: 8.2 mg/dL — ABNORMAL LOW (ref 8.9–10.3)
Chloride: 104 mmol/L (ref 98–111)
Creatinine, Ser: 3.65 mg/dL — ABNORMAL HIGH (ref 0.44–1.00)
GFR, Estimated: 14 mL/min — ABNORMAL LOW (ref >60)
Glucose, Bld: 119 mg/dL — ABNORMAL HIGH (ref 70–99)
Potassium: 2.9 mmol/L — ABNORMAL LOW (ref 3.5–5.1)
Sodium: 143 mmol/L (ref 135–145)
Total Bilirubin: 4.9 mg/dL — ABNORMAL HIGH (ref 0.3–1.2)
Total Protein: 5.1 g/dL — ABNORMAL LOW (ref 6.5–8.1)

## 2022-10-20 LAB — HEMOGLOBIN AND HEMATOCRIT, BLOOD
HCT: 23 % — ABNORMAL LOW (ref 36.0–46.0)
Hemoglobin: 8.2 g/dL — ABNORMAL LOW (ref 12.0–15.0)

## 2022-10-20 LAB — GLUCOSE, CAPILLARY
Glucose-Capillary: 123 mg/dL — ABNORMAL HIGH (ref 70–99)
Glucose-Capillary: 127 mg/dL — ABNORMAL HIGH (ref 70–99)
Glucose-Capillary: 94 mg/dL (ref 70–99)
Glucose-Capillary: 144 mg/dL — ABNORMAL HIGH (ref 70–99)
Glucose-Capillary: 127 mg/dL — ABNORMAL HIGH (ref 70–99)
Glucose-Capillary: 117 mg/dL — ABNORMAL HIGH (ref 70–99)

## 2022-10-20 LAB — BPAM PLATELET PHERESIS
Blood Product Expiration Date: 202409172359
ISSUE DATE / TIME: 202409132100
Unit Type and Rh: 6200

## 2022-10-20 LAB — PREPARE RBC (CROSSMATCH)

## 2022-10-20 LAB — PHOSPHORUS
Phosphorus: 2.8 mg/dL (ref 2.5–4.6)
Phosphorus: 2.5 mg/dL (ref 2.5–4.6)

## 2022-10-20 LAB — MAGNESIUM
Magnesium: 1.7 mg/dL (ref 1.7–2.4)
Magnesium: 1.6 mg/dL — ABNORMAL LOW (ref 1.7–2.4)

## 2022-10-20 MED ORDER — DEXTROSE IN LACTATED RINGERS 5 % IV SOLN: INTRAVENOUS | Status: AC

## 2022-10-20 MED ORDER — SODIUM CHLORIDE 0.9% IV SOLUTION
Freq: Once | INTRAVENOUS | Status: AC
  Administered 2022-10-20: 50 mL via INTRAVENOUS

## 2022-10-20 MED ORDER — GUAIFENESIN 100 MG/5ML PO LIQD
5.0000 mL | ORAL | Status: DC | PRN
  Administered 2022-10-21: 5 mL
  Filled 2022-10-20: qty 15

## 2022-10-20 MED ORDER — POTASSIUM CHLORIDE 10 MEQ/50ML IV SOLN
10.0000 meq | INTRAVENOUS | Status: AC
  Administered 2022-10-20 (×6): 10 meq via INTRAVENOUS
  Filled 2022-10-20 (×6): qty 50

## 2022-10-20 MED ORDER — ACETAMINOPHEN 325 MG PO TABS
650.0000 mg | ORAL_TABLET | Freq: Four times a day (QID) | ORAL | Status: DC | PRN
  Administered 2022-10-20 – 2022-10-26 (×9): 650 mg
  Filled 2022-10-20 (×9): qty 2

## 2022-10-20 MED ORDER — FENTANYL CITRATE PF 50 MCG/ML IJ SOSY
50.0000 ug | PREFILLED_SYRINGE | INTRAMUSCULAR | Status: DC | PRN
  Administered 2022-10-20 – 2022-10-23 (×16): 50 ug via INTRAVENOUS
  Filled 2022-10-20 (×16): qty 1

## 2022-10-20 MED ORDER — GUAIFENESIN 100 MG/5ML PO LIQD
5.0000 mL | ORAL | Status: DC | PRN
  Administered 2022-10-20: 5 mL via ORAL
  Filled 2022-10-20: qty 10

## 2022-10-20 MED ORDER — ACETAMINOPHEN 650 MG RE SUPP: 650.0000 mg | Freq: Four times a day (QID) | RECTAL | Status: DC | PRN

## 2022-10-20 MED ORDER — FENTANYL CITRATE PF 50 MCG/ML IJ SOSY
25.0000 ug | PREFILLED_SYRINGE | INTRAMUSCULAR | Status: DC | PRN
  Administered 2022-10-20 (×3): 25 ug via INTRAVENOUS
  Filled 2022-10-20 (×3): qty 1

## 2022-10-20 NOTE — Progress Notes (Signed)
PIV placed with ultrasound to remove the CVC.  @ sticks to gain access with 2 other staff to hold combative patient.  Patient a moving target.  PIV obtained flushed with blood return but questionably puffy around site.  Difficult to assess due to pt constantly moving arms and legs and screaming.  Yeni RN at bedside aware to use PIV cautiously.

## 2022-10-20 NOTE — Progress Notes (Signed)
Daily Progress Note   Patient Name: Brandy Bishop       Date: 10/20/2022 DOB: [birth year]  Age: 62 y.o. MRN#: [id number] Attending Physician: Chilton Greathouse, MD Primary Care Physician: Cline Crock, NP Admit Date: 10/14/2022  Reason for Consultation/Follow-up: Establishing goals of care  Length of Stay: 6  Current Medications: Scheduled Meds:   Chlorhexidine Gluconate Cloth  6 each Topical Q0600   folic acid  1 mg Per Tube Daily   multivitamin with minerals  1 tablet Per Tube Daily   mouth rinse  15 mL Mouth Rinse 4 times per day   [START ON 10/22/2022] thiamine  100 mg Oral Daily    Continuous Infusions:  cefTRIAXone (ROCEPHIN)  IV Stopped (10/19/22 1119)   feeding supplement (OSMOLITE 1.2 CAL) 55 mL/hr at 10/20/22 0900   thiamine (VITAMIN B1) injection Stopped (10/19/22 1203)    PRN Meds: acetaminophen **OR** acetaminophen, fentaNYL (SUBLIMAZE) injection, hydrALAZINE, labetalol, mouth rinse  Physical Exam Vitals reviewed.  Constitutional:      General: She is sleeping.     Appearance: She is ill-appearing.     Comments: Cortrak in place  Pulmonary:     Effort: Tachypnea present.  Skin:    General: Skin is warm and dry.             Vital Signs: BP 132/82   Pulse (!) 105   Temp 98.1 F (36.7 C) (Axillary)   Resp (!) 25   Ht 5\' 3"  (1.6 m)   Wt 45.6 kg   SpO2 99%   BMI 17.81 kg/m  SpO2: SpO2: 99 % O2 Device: O2 Device: Room Air O2 Flow Rate: O2 Flow Rate (L/min): 2 L/min    Patient Active Problem List   Diagnosis Date Noted   Sepsis due to Escherichia coli with encephalopathy without septic shock (HCC) 10/17/2022   Acute metabolic encephalopathy 10/17/2022   Severe sepsis (HCC) 10/14/2022   Hypovolemia 10/14/2022   Dehydration 10/14/2022   AKI  (acute kidney injury) (HCC) 10/14/2022   CAP (community acquired pneumonia) 06/30/2022   Colon cancer screening 08/10/2020   Acute liver failure 06/08/2020   Acute GI bleeding 09/08/2019   Polysubstance abuse (HCC) 09/08/2019   Gastroesophageal reflux disease with esophagitis    Seizure (HCC) 05/30/2015   Cough    Subtherapeutic phenytoin level  Acute upper GI bleed    Hypomagnesemia 04/16/2014   Hypokalemia 04/16/2014   Thrombocytopenia (HCC) 04/16/2014   Liver disease 04/15/2014   ETOH abuse 04/15/2014   Chronic pancreatitis (HCC) 04/15/2014   Protein-calorie malnutrition, severe (HCC) 04/15/2014   Severe protein-calorie malnutrition (HCC) 04/15/2014   Bleeding gastrointestinal    Abnormal CT scan, colon    Rectal bleeding    Abdominal pain    Alcoholic hepatitis without ascites    GI bleed 04/14/2014   Viral URI with cough 05/07/2012   Pyelonephritis 05/07/2012   Seizure disorder (HCC) 05/07/2012   Asthma with bronchitis 05/07/2012   HTN (hypertension) 05/07/2012    Palliative Care Assessment & Plan   Patient Profile: 62 y.o. female  with past medical history of ETOH use, asthma, chronic back pain, colitis, CIB, pancreatitis, and seizures admitted on 10/14/2022 with poor appetite and weakness. Patient found to have E coli bacteremia; STEC gastroenteritis. Severe AKI, no need for HD at this point. Has received some benzos during hospitalizations for withdrawal symptoms. PMT consulted for GOC discussions.    Assessment: Patient appears more comfortable than yesterday. RN reports she is more alert today. When I approach her and say her name she moved in bed.   0930: Called her brother Renae Gloss to update him. He is glad we had the goals of care meeting yesterday. He is on his way to hospital now. We will try to connect while he is here.  1610: Met with brother Renae Gloss. He is encouraged by the patient's increased alertness. We discussed that she is still very sick and he  understands this. He and the family will continue to pray for improvement.  Recommendations/Plan: Change code status to DNR Do not intubate, do not start vasopressors, do not initiate dialysis Treat the treatable Time for outcomes Patient's brother Thersa Salt [phone] will remain the point of contact for the family. Spiritual care consult Continued PMT support   Code Status:    Code Status Orders  (From admission, onward)           Start     Ordered   10/19/22 1308  Do not attempt resuscitation (DNR)- Limited -Do Not Intubate (DNI)  (Code Status)  Continuous       Question Answer Comment  If pulseless and not breathing No CPR or chest compressions.   In Pre-Arrest Conditions (Patient Is Breathing and Has A Pulse) Do not intubate. Provide all appropriate non-invasive medical interventions. Avoid ICU transfer unless indicated or required.   Consent: Discussion documented in EHR or advanced directives reviewed      10/19/22 1307             Care plan was discussed with bedside RN   Time spent: 35 minutes  Thank you for allowing the Palliative Medicine Team to assist in the care of this patient.     Sherryll Burger, NP  Please contact Palliative Medicine Team phone at [phone] for questions and concerns.

## 2022-10-20 NOTE — Progress Notes (Addendum)
eLink Physician-Brief Progress Note Patient Name: Brandy Bishop DOB: 1960/11/10 MRN: [id number]   Date of Service  10/20/2022  HPI/Events of Note  Pt still in pain despite PRN fentanyl.  Moaning, appears distressed.   eICU Interventions  Decreased PRN fentanyl interval - can give fentanyl q1h for pain.         Glenette Bookwalter M DELA CRUZ 10/20/2022, 12:11 AM  3:33 AM Pain remains uncontrolled, RR up to the 40s.  Increased fentanyl further up to q1h PRN Will continue to monitor VS closely.

## 2022-10-20 NOTE — Progress Notes (Addendum)
NAME:  Brandy Bishop, MRN:  [id number], DOB:  Nov 29, 1960, LOS: 6 ADMISSION DATE:  10/14/2022, CONSULTATION DATE:  10/14/2022 REFERRING MD:  Jeraldine Loots - EDP, CHIEF COMPLAINT:  Weakness, dehydration   History of Present Illness:   Brandy Bishop is a 62 y.o. female who has a PMH as below. She presented to [facility] ED 9/9 with weakness and aches. She has had decreased PO intake x 2 - 3 days due to no appetite and weakness. She denies any recent fevers/chills/sweats, chest pain, dyspnea, abd pain, N/V/D, abd pain, myalgias.  She was found to have new AKI, hypokalemia, hyponatremia, elevated lactate, AGMA. She was also significantly dehydrated on ED presentation.  BP remained stable throughout. PCCM asked to see in consultation given AKI, lactic acidosis.  PCCM reconsulted 9/11 for increased WOB, tachypnea new O2 requirement.  Pertinent Medical History:   Past Medical History:  Diagnosis Date   Alcoholism (HCC) 2011   Asthma    Chronic lower back pain    Colitis 04/2014.   Bloody diarrhea   Daily headache    GIB (gastrointestinal bleeding)    Hypertension    Pancreatitis    Chronic pancreatitis noted on CT scan in 04/2014.   Polysubstance abuse (HCC) 2011   Pyelonephritis 05/2012   Seizures (HCC)    last seizure was 04/2019 per patient    Significant Hospital Events: Including procedures, antibiotic start and stop dates in addition to other pertinent events   9/9 Admitted. PCCM consulted for possible ICU needs, given hypotension. Fortunately fluid responsive. AKI presumed prerenal in the setting of dehydration/UTI? BCx resulted +E.coli. Ceftriaxone. 9/11 PCCM called back for increased WOB, tachypnea to 30s-40s. CXR wet-appearing with +pulmonary edema.  On Lasix diuresis 9/12 stool test positive for STEC, STEC-HUS being considered 9/14 given 1 dose of eculizumab, made DNR after discussion with palliative care  Interim History/Subjective:   Given a dose of eculizumab yesterday Made  DNR after discussion with palliative care  Objective:  Blood pressure 132/82, pulse (!) 105, temperature 98.1 F (36.7 C), temperature source Axillary, resp. rate (!) 25, height 5\' 3"  (1.6 m), weight 45.6 kg, SpO2 99%.        Intake/Output Summary (Last 24 hours) at 10/20/2022 0910 Last data filed at 10/20/2022 0900 Gross per 24 hour  Intake 2361.73 ml  Output 1350 ml  Net 1011.73 ml   Filed Weights   10/18/22 0306 10/19/22 0136 10/20/22 0500  Weight: 48.2 kg 45.6 kg 45.6 kg   Physical Examination: Blood pressure 124/85, pulse (!) 106, temperature 100.1 F (37.8 C), temperature source Axillary, resp. rate (!) 39, height 5\' 3"  (1.6 m), weight 45.6 kg, SpO2 95%. Gen:      No acute distress, chronically ill-appearing HEENT:  EOMI, sclera anicteric Neck:     No masses; no thyromegaly Lungs:   Tachypnea CV:         Tachycardia Abd:      + bowel sounds; soft, non-tender; no palpable masses, no distension Ext:    No edema; adequate peripheral perfusion Skin:      Warm and dry; no rash Neuro: Opens eyes to commands, moving all extremities  Lab/imaging reviewed Potassium 2.9, BUN/creatinine 75/3.65 Total bilirubin 4.9 WBC count increased to 22.4, hemoglobin 6.9, platelets 19  Assessment & Plan:  Respiratory distress Has been diuresed adequately with no improvement in status Will keep net I's/O even today and hold further doses of Lasix  Sepsis secondary to E. coli bacteremia STEC gastro enteritis Continue ceftriaxone as  the E. coli is pansensitive Keep net even Leukocytosis noted.  Cannot get PICC line due to recent bacteremia.  Will try to get peripheral IVs and remove femoral CVL  AKI, likely prerenal in the setting of dehydration Hyponatremia AG/lactic acidosis Renal is following Possible HUS syndrome due to STEC from stool studies Although studies are not entirely consistent we have given a dose of eculizumab for STEC-HUS.  Discussed with hematology.  This effect should  last for 7 days.  Chronic thrombocytopenia 2/2 EtOH and hepatic steatosis Follow platelets.  Transfuse for platelets less than 10 or if she is bleeding  Anemia of chronic disease, hemolysis Transfuse for hemoglobin less than 7 Monitor CBC  EtOH abuse history - Monitor for signs/symptoms of withdrawal - Limit benzos as she is well sedated - Continue thiamine - MV as able  Severe malnutrition -Titrate tube feeds  Goals of care Made DNR yesterday.  Family does not want vasopressors or dialysis.  If she declines or does not respond to eculizumab then consider comfort measures  Best practice (evaluated daily):   Diet/type: Tube feeds DVT prophylaxis: SCD, Holding chemical prophylaxis due to thrombocytopenia GI prophylaxis: N/A Lines: Central line Foley:  Yes, and it is still needed Code Status:  full code Last date of multidisciplinary goals of care discussion [] . See above  Critical care time:    The patient is critically ill with multiple organ system failure and requires high complexity decision making for assessment and support, frequent evaluation and titration of therapies, advanced monitoring, review of radiographic studies and interpretation of complex data.   Critical Care Time devoted to patient care services, exclusive of separately billable procedures, described in this note is 35 minutes.   Chilton Greathouse MD [facility] See Amion for pager  If no response to pager , please call [phone] until 7pm After 7:00 pm call Elink  [phone] 10/20/2022, 9:10 AM

## 2022-10-20 NOTE — Progress Notes (Signed)
[facility] NEPHROLOGY PROGRESS NOTE  Assessment/ Plan: Pt is a 62 y.o. yo female with medical history of alcohol abuse, liver cirrhosis, thrombocytopenia, hypertension, MDD who was admitted on 9/9 with seizure, sepsis due to E. coli bacteremia and AKI.  # Sepsis secondary due to Shiga like toxin E. coli bacteremia: Currently on ceftriaxone and supportive treatment.  In ICU.  # Nonoliguric AKI likely ischemic ATN in the setting of sepsis/ongoing GI loss, poor oral intake with intravascular depletion.  Concern for HUS given STEC diagnosis. Continue to hold diuretics, agree with gentle IV hydration. No improvement in kidney function with low GFR.  Not a candidate for dialysis.  Continue current supportive care.  Noted patient is DNR/DNI and discussing about possible hospice.  # Shiga like toxin associated with E. coli bacteremia: Concern for HUS.  Seen by hematologist and starting on eculizumab.  Meningococcal vaccination before the infusion.  # Alcohol abuse, liver cirrhosis, protein calorie malnutrition/seizure: Currently on thiamine, CIWA protocol in ICU.  Remains encephalopathic.  # Severe anemia, thrombocytopenia: PRBC transfusion.  Monitor lab.  # Hypokalemia: Replete potassium chloride.  Discussed with ICU provider, nurses and family members on 9/14.   Overall poor prognosis and recommend hospice and comfort care.  Subjective: Seen and examined ICU.  She remains delirious and confused.  Urine output is around 1.3 L but no overall improvement in mental status.  Noted seen by palliative care team.  Objective Vital signs in last 24 hours: Vitals:   10/20/22 0802 10/20/22 0900 10/20/22 1000 10/20/22 1100  BP:  132/82 (!) 155/86 (!) 149/80  Pulse:  (!) 105 (!) 112 (!) 120  Resp:  (!) 25 18 (!) 24  Temp: 98.1 F (36.7 C)     TempSrc: Axillary     SpO2:  99% 100% 97%  Weight:      Height:       Weight change: 0 kg  Intake/Output Summary (Last 24 hours) at  10/20/2022 1129 Last data filed at 10/20/2022 1100 Gross per 24 hour  Intake 2260.9 ml  Output 1350 ml  Net 910.9 ml       Labs: RENAL PANEL Recent Labs  Lab 10/17/22 2100 10/17/22 2147 10/18/22 0259 10/18/22 1305 10/18/22 1756 10/18/22 1817 10/19/22 0211 10/19/22 1739 10/20/22 0640  NA 138   < > 137 138 141  --  141  --  143  K 3.5   < > 3.2* 3.2* 3.1*  --  3.5  --  2.9*  CL 96*  --  99 98  --   --  98  --  104  CO2 28  --  27 27  --   --  27  --  28  GLUCOSE 72  --  95 235*  --   --  121*  --  119*  BUN 72*  --  73* 69*  --   --  72*  --  75*  CREATININE 3.57*  --  3.61* 3.41*  --   --  3.61*  --  3.65*  CALCIUM 8.1*  --  8.0* 7.9*  --   --  8.4*  --  8.2*  MG 2.1  --   --  1.8  --  1.7 1.7 1.6* 1.7  PHOS 4.6  --   --  3.0  --  2.7 3.5 2.8 2.8  ALBUMIN 1.5*  --  1.6* 1.7*  --   --  2.3*  --  1.8*   < > = values  in this interval not displayed.    Liver Function Tests: Recent Labs  Lab 10/18/22 0259 10/18/22 1305 10/19/22 0211 10/20/22 0640  AST 58*  --  65* 33  ALT 26  --  27 20  ALKPHOS 120  --  147* 123  BILITOT 4.6* 4.6* 6.2* 4.9*  PROT 4.9*  --  5.5* 5.1*  ALBUMIN 1.6* 1.7* 2.3* 1.8*   No results for input(s): "LIPASE", "AMYLASE" in the last 168 hours. No results for input(s): "AMMONIA" in the last 168 hours. CBC: Recent Labs    10/15/22 0540 10/15/22 2035 10/16/22 0600 10/18/22 1756 10/18/22 1817 10/19/22 0211 10/19/22 0445 10/20/22 0640  HGB 8.0* 7.2*   < > 6.1* 5.4* 8.6* 7.8* 6.9*  MCV 91.6 90.6   < >  --  90.2 88.1  --  88.9  VITAMINB12 894  --   --   --   --   --   --   --   FOLATE 13.5  --   --   --   --   --   --   --   FERRITIN 590*  --   --   --   --   --   --   --   TIBC 126*  --   --   --   --   --   --   --   IRON 6*  --   --   --   --   --   --   --   RETICCTPCT  --  0.6  --   --   --   --   --   --    < > = values in this interval not displayed.    Cardiac Enzymes: Recent Labs  Lab 10/14/22 2134  CKTOTAL 541*    CBG: Recent Labs  Lab 10/19/22 1522 10/19/22 1939 10/19/22 2333 10/20/22 0308 10/20/22 0744  GLUCAP 89 128* 143* 123* 127*    Iron Studies: No results for input(s): "IRON", "TIBC", "TRANSFERRIN", "FERRITIN" in the last 72 hours. Studies/Results: [place] EKG SITE RITE  Result Date: 10/20/2022 If Site Rite image not attached, placement could not be confirmed due to current cardiac rhythm.   Medications: Infusions:  cefTRIAXone (ROCEPHIN)  IV 2 g (10/20/22 1104)   feeding supplement (OSMOLITE 1.2 CAL) 55 mL/hr at 10/20/22 1100   thiamine (VITAMIN B1) injection Stopped (10/20/22 1054)    Scheduled Medications:  Chlorhexidine Gluconate Cloth  6 each Topical Q0600   folic acid  1 mg Per Tube Daily   multivitamin with minerals  1 tablet Per Tube Daily   mouth rinse  15 mL Mouth Rinse 4 times per day   [START ON 10/22/2022] thiamine  100 mg Oral Daily    have reviewed scheduled and prn medications.  Physical Exam: General: Critically ill looking female, delirious and confused. Heart:RRR, s1s2 nl Lungs: Basal decreased breath sound. Abdomen:soft, Non-tender, non-distended Extremities:No edema Neurology: Encephalopathic and delirious.  Brandy Bishop Brandy Bishop Brandy Bishop 10/20/2022,11:29 AM  LOS: 6 days

## 2022-10-20 NOTE — Progress Notes (Signed)
eLink Physician-Brief Progress Note Patient Name: Brandy Bishop DOB: [birth year] MRN: [id number]   Date of Service  10/20/2022  HPI/Events of Note  Pt requesting for PRN robitussin. She has intermittent coughing spells.   eICU Interventions  Robitussin ordered PRN.      Intervention Category Minor Interventions: Routine modifications to care plan (e.g. PRN medications for pain, fever)  Larinda Buttery 10/20/2022, 10:23 PM  5:37 AM Notified of Mg 1.6, crea 3.62.  K 4.7.   Plan> Replete K - 1g magnesium sulfate ordered.

## 2022-10-20 NOTE — Progress Notes (Signed)
Pharmacy Electrolyte Replacement  Recent Labs:  Recent Labs    10/20/22 0640  K 2.9*  MG 1.7  PHOS 2.8  CREATININE 3.65*    Low Critical Values (K </= 2.5, Phos </= 1, Mg </= 1) Present: K = 2.9  MD Contacted: Dr Isaiah Serge  Plan: Kcl x6  Ulyses Southward, PharmD, BCIDP, AAHIVP, CPP Infectious Disease Pharmacist 10/20/2022 12:34 PM

## 2022-10-20 NOTE — Progress Notes (Signed)
PICC placement approval from Dr Ronalee Belts.  Secure chat with Dr Isaiah Serge re positive blood cultures,  States will draw repeat blood cultures prior to PICC placement.  Yeni RN aware.

## 2022-10-21 ENCOUNTER — Inpatient Hospital Stay (HOSPITAL_COMMUNITY): Payer: MEDICAID

## 2022-10-21 DIAGNOSIS — A419 Sepsis, unspecified organism: Secondary | ICD-10-CM | POA: Diagnosis not present

## 2022-10-21 DIAGNOSIS — N179 Acute kidney failure, unspecified: Secondary | ICD-10-CM | POA: Diagnosis not present

## 2022-10-21 DIAGNOSIS — M7989 Other specified soft tissue disorders: Secondary | ICD-10-CM

## 2022-10-21 DIAGNOSIS — R6520 Severe sepsis without septic shock: Secondary | ICD-10-CM | POA: Diagnosis not present

## 2022-10-21 LAB — CBC WITH DIFFERENTIAL/PLATELET
Abs Immature Granulocytes: 0.55 10*3/uL — ABNORMAL HIGH (ref 0.00–0.07)
Basophils Absolute: 0.1 10*3/uL (ref 0.0–0.1)
Basophils Relative: 0 %
Eosinophils Absolute: 0.1 10*3/uL (ref 0.0–0.5)
Eosinophils Relative: 0 %
HCT: 24.1 % — ABNORMAL LOW (ref 36.0–46.0)
Hemoglobin: 8.5 g/dL — ABNORMAL LOW (ref 12.0–15.0)
Immature Granulocytes: 2 %
Lymphocytes Relative: 10 %
Lymphs Abs: 2.7 10*3/uL (ref 0.7–4.0)
MCH: 30.7 pg (ref 26.0–34.0)
MCHC: 35.3 g/dL (ref 30.0–36.0)
MCV: 87 fL (ref 80.0–100.0)
Monocytes Absolute: 1.5 10*3/uL — ABNORMAL HIGH (ref 0.1–1.0)
Monocytes Relative: 6 %
Neutro Abs: 21.8 10*3/uL — ABNORMAL HIGH (ref 1.7–7.7)
Neutrophils Relative %: 82 %
Platelets: 20 10*3/uL — CL (ref 150–400)
RBC: 2.77 MIL/uL — ABNORMAL LOW (ref 3.87–5.11)
RDW: 16.8 % — ABNORMAL HIGH (ref 11.5–15.5)
WBC: 26.7 10*3/uL — ABNORMAL HIGH (ref 4.0–10.5)
nRBC: 0 % (ref 0.0–0.2)

## 2022-10-21 LAB — TYPE AND SCREEN
ABO/RH(D): B POS
Antibody Screen: NEGATIVE
Unit division: 0
Unit division: 0

## 2022-10-21 LAB — COMPREHENSIVE METABOLIC PANEL
ALT: 21 U/L (ref 0–44)
AST: 46 U/L — ABNORMAL HIGH (ref 15–41)
Albumin: 1.8 g/dL — ABNORMAL LOW (ref 3.5–5.0)
Alkaline Phosphatase: 180 U/L — ABNORMAL HIGH (ref 38–126)
Anion gap: 10 (ref 5–15)
BUN: 77 mg/dL — ABNORMAL HIGH (ref 8–23)
CO2: 26 mmol/L (ref 22–32)
Calcium: 8.4 mg/dL — ABNORMAL LOW (ref 8.9–10.3)
Chloride: 106 mmol/L (ref 98–111)
Creatinine, Ser: 3.62 mg/dL — ABNORMAL HIGH (ref 0.44–1.00)
GFR, Estimated: 14 mL/min — ABNORMAL LOW (ref >60)
Glucose, Bld: 118 mg/dL — ABNORMAL HIGH (ref 70–99)
Potassium: 4.7 mmol/L (ref 3.5–5.1)
Sodium: 142 mmol/L (ref 135–145)
Total Bilirubin: 6.5 mg/dL — ABNORMAL HIGH (ref 0.3–1.2)
Total Protein: 5.3 g/dL — ABNORMAL LOW (ref 6.5–8.1)

## 2022-10-21 LAB — BPAM RBC
Blood Product Expiration Date: 202410072359
Blood Product Expiration Date: 202410072359
ISSUE DATE / TIME: 202409132229
ISSUE DATE / TIME: 202409151411
Unit Type and Rh: 7300
Unit Type and Rh: 7300

## 2022-10-21 LAB — MAGNESIUM: Magnesium: 1.6 mg/dL — ABNORMAL LOW (ref 1.7–2.4)

## 2022-10-21 LAB — GLUCOSE, CAPILLARY
Glucose-Capillary: 103 mg/dL — ABNORMAL HIGH (ref 70–99)
Glucose-Capillary: 109 mg/dL — ABNORMAL HIGH (ref 70–99)
Glucose-Capillary: 113 mg/dL — ABNORMAL HIGH (ref 70–99)
Glucose-Capillary: 116 mg/dL — ABNORMAL HIGH (ref 70–99)
Glucose-Capillary: 148 mg/dL — ABNORMAL HIGH (ref 70–99)
Glucose-Capillary: 89 mg/dL (ref 70–99)

## 2022-10-21 MED ORDER — SODIUM CHLORIDE 0.9 % IV SOLN: INTRAVENOUS | Status: DC | PRN

## 2022-10-21 MED ORDER — MAGNESIUM SULFATE 2 GM/50ML IV SOLN
2.0000 g | Freq: Once | INTRAVENOUS | Status: AC
  Administered 2022-10-21: 2 g via INTRAVENOUS
  Filled 2022-10-21: qty 50

## 2022-10-21 MED ORDER — MIRTAZAPINE 15 MG PO TABS
7.5000 mg | ORAL_TABLET | Freq: Every day | ORAL | Status: DC
  Administered 2022-10-21 – 2022-10-25 (×5): 7.5 mg
  Filled 2022-10-21 (×6): qty 1

## 2022-10-21 MED ORDER — WHITE PETROLATUM EX OINT
TOPICAL_OINTMENT | CUTANEOUS | Status: DC | PRN
  Filled 2022-10-21 (×3): qty 28.35

## 2022-10-21 MED ORDER — PANTOPRAZOLE SODIUM 40 MG IV SOLR
40.0000 mg | Freq: Every day | INTRAVENOUS | Status: DC
  Administered 2022-10-21 – 2022-10-26 (×6): 40 mg via INTRAVENOUS
  Filled 2022-10-21 (×6): qty 10

## 2022-10-21 MED ORDER — MAGNESIUM SULFATE IN D5W 1-5 GM/100ML-% IV SOLN
1.0000 g | Freq: Once | INTRAVENOUS | Status: AC
  Administered 2022-10-21: 1 g via INTRAVENOUS
  Filled 2022-10-21: qty 100

## 2022-10-21 NOTE — Plan of Care (Signed)
  Problem: Clinical Measurements: Goal: Ability to maintain clinical measurements within normal limits will improve Outcome: Progressing Goal: Will remain free from infection Outcome: Progressing Goal: Diagnostic test results will improve Outcome: Progressing Goal: Respiratory complications will improve Outcome: Progressing Goal: Cardiovascular complication will be avoided Outcome: Progressing   Problem: Activity: Goal: Risk for activity intolerance will decrease Outcome: Progressing   Problem: Nutrition: Goal: Adequate nutrition will be maintained Outcome: Progressing   Problem: Coping: Goal: Level of anxiety will decrease Outcome: Progressing   Problem: Elimination: Goal: Will not experience complications related to bowel motility Outcome: Progressing Goal: Will not experience complications related to urinary retention Outcome: Progressing   Problem: Pain Managment: Goal: General experience of comfort will improve Outcome: Progressing   Problem: Safety: Goal: Ability to remain free from injury will improve Outcome: Progressing   Problem: Skin Integrity: Goal: Risk for impaired skin integrity will decrease Outcome: Progressing   Problem: Safety: Goal: Non-violent Restraint(s) Outcome: Progressing   Problem: Education: Goal: Knowledge of General Education information will improve Description: Including pain rating scale, medication(s)/side effects and non-pharmacologic comfort measures Outcome: Not Progressing   Problem: Health Behavior/Discharge Planning: Goal: Ability to manage health-related needs will improve Outcome: Not Progressing

## 2022-10-21 NOTE — TOC CM/SW Note (Signed)
Transition of Care [facility]) - Inpatient Brief Assessment   Patient Details  Name: AADYA VANESS MRN: [id number] Date of Birth: [birth year]  Transition of Care [facility]) CM/SW Contact:    Tom-Johnson, Hershal Coria, RN Phone Number: 10/21/2022, 1:42 PM   Clinical Narrative:  Patient presented to the ED with Generalized Body Aches and Weakness. Admitted with Severe Sepsis, on IV abx.    No TOC needs or recommendations noted at this time.  Patient not Medically ready for discharge.  CM will continue to follow as patient progresses with care towards discharge.      Transition of Care Asessment: Insurance and Status: Insurance coverage has been reviewed Patient has primary care physician: Yes Home environment has been reviewed: Yes Prior level of function:: Independent Prior/Current Home Services: No current home services Social Determinants of Health Reivew: SDOH reviewed no interventions necessary Readmission risk has been reviewed: Yes Transition of care needs: transition of care needs identified, TOC will continue to follow

## 2022-10-21 NOTE — Progress Notes (Addendum)
Daily Progress Note   Patient Name: Brandy Bishop       Date: 10/21/2022 DOB: 15-Jan-1961  Age: 62 y.o. MRN#: [id number] Attending Physician: Coralyn Helling, MD Primary Care Physician: Cline Crock, NP Admit Date: 10/14/2022  Reason for Consultation/Follow-up: Establishing goals of care  Length of Stay: 7  Current Medications: Scheduled Meds:   Chlorhexidine Gluconate Cloth  6 each Topical Q0600   folic acid  1 mg Per Tube Daily   mirtazapine  7.5 mg Per Tube QHS   multivitamin with minerals  1 tablet Per Tube Daily   mouth rinse  15 mL Mouth Rinse 4 times per day   pantoprazole (PROTONIX) IV  40 mg Intravenous Daily   [START ON 10/22/2022] thiamine  100 mg Oral Daily    Continuous Infusions:  sodium chloride 10 mL/hr at 10/21/22 1100   cefTRIAXone (ROCEPHIN)  IV 2 g (10/21/22 1102)   feeding supplement (OSMOLITE 1.2 CAL) 1,000 mL (10/21/22 0631)    PRN Meds: sodium chloride, acetaminophen **OR** acetaminophen, fentaNYL (SUBLIMAZE) injection, guaiFENesin, hydrALAZINE, labetalol, mouth rinse, white petrolatum  Physical Exam Vitals reviewed.  Constitutional:      General: She is sleeping.     Appearance: She is ill-appearing.     Comments: Cortrak in place  Pulmonary:     Effort: Tachypnea present.  Skin:    General: Skin is warm and dry.             Vital Signs: BP 122/68   Pulse (!) 138   Temp 99.4 F (37.4 C) (Axillary)   Resp (!) 32   Ht 5\' 3"  (1.6 m)   Wt 45.6 kg   SpO2 96%   BMI 17.81 kg/m  SpO2: SpO2: 96 % O2 Device: O2 Device: Room Air O2 Flow Rate: O2 Flow Rate (L/min): 2 L/min    Patient Active Problem List   Diagnosis Date Noted   Sepsis due to Escherichia coli with encephalopathy without septic shock (HCC) 10/17/2022   Acute metabolic  encephalopathy 10/17/2022   Severe sepsis (HCC) 10/14/2022   Hypovolemia 10/14/2022   Dehydration 10/14/2022   AKI (acute kidney injury) (HCC) 10/14/2022   CAP (community acquired pneumonia) 06/30/2022   Colon cancer screening 08/10/2020   Acute liver failure 06/08/2020   Acute GI bleeding 09/08/2019   Polysubstance abuse (HCC) 09/08/2019  Gastroesophageal reflux disease with esophagitis    Seizure (HCC) 05/30/2015   Cough    Subtherapeutic phenytoin level    Acute upper GI bleed    Hypomagnesemia 04/16/2014   Hypokalemia 04/16/2014   Thrombocytopenia (HCC) 04/16/2014   Liver disease 04/15/2014   ETOH abuse 04/15/2014   Chronic pancreatitis (HCC) 04/15/2014   Protein-calorie malnutrition, severe (HCC) 04/15/2014   Severe protein-calorie malnutrition (HCC) 04/15/2014   Bleeding gastrointestinal    Abnormal CT scan, colon    Rectal bleeding    Abdominal pain    Alcoholic hepatitis without ascites    GI bleed 04/14/2014   Viral URI with cough 05/07/2012   Pyelonephritis 05/07/2012   Seizure disorder (HCC) 05/07/2012   Asthma with bronchitis 05/07/2012   HTN (hypertension) 05/07/2012    Palliative Care Assessment & Plan   Patient Profile: 62 y.o. female  with past medical history of ETOH use, asthma, chronic back pain, colitis, CIB, pancreatitis, and seizures admitted on 10/14/2022 with poor appetite and weakness. Patient found to have E coli bacteremia; STEC gastroenteritis. Severe AKI, no need for HD at this point. Has received some benzos during hospitalizations for withdrawal symptoms. PMT consulted for GOC discussions.    Assessment: RN reports patient is able to follow simple commands today. Today when I called her name she turned her head in my direction but did not verbally respond.   1045: Called her brother Renae Gloss to update him. I shared that the patient was following simple commands for nursing. I also shared that her kidney function has not improved. He  understands. The family continues to hope that with time and prayer she will improve. He did ask about HCPOA paperwork and I explained that his sister would have to be decisional to complete that document. He understands.    Recommendations/Plan: DNR Do not intubate, do not start vasopressors, do not initiate dialysis Treat the treatable Time for outcomes Patient's brother Thersa Salt [phone] will remain the point of contact for the family. Spiritual care consult Continued PMT support   Code Status:    Code Status Orders  (From admission, onward)           Start     Ordered   10/19/22 1308  Do not attempt resuscitation (DNR)- Limited -Do Not Intubate (DNI)  (Code Status)  Continuous       Question Answer Comment  If pulseless and not breathing No CPR or chest compressions.   In Pre-Arrest Conditions (Patient Is Breathing and Has A Pulse) Do not intubate. Provide all appropriate non-invasive medical interventions. Avoid ICU transfer unless indicated or required.   Consent: Discussion documented in EHR or advanced directives reviewed      10/19/22 1307             Care plan was discussed with bedside RN   Time spent: 35 minutes  Thank you for allowing the Palliative Medicine Team to assist in the care of this patient.     Sherryll Burger, NP  Please contact Palliative Medicine Team phone at [phone] for questions and concerns.

## 2022-10-21 NOTE — Progress Notes (Signed)
NAME:  Brandy Bishop, MRN:  [id number], DOB:  Aug 07, 1960, LOS: 7 ADMISSION DATE:  10/14/2022, CONSULTATION DATE:  10/14/2022 REFERRING MD:  Jeraldine Loots - EDP, CHIEF COMPLAINT:  Weakness, dehydration   History of Present Illness:   Brandy Bishop is a 62 y.o. female who has a PMH as below. She presented to [facility] ED 9/9 with weakness and aches. She has had decreased PO intake x 2 - 3 days due to no appetite and weakness. She denies any recent fevers/chills/sweats, chest pain, dyspnea, abd pain, N/V/D, abd pain, myalgias.  She was found to have new AKI, hypokalemia, hyponatremia, elevated lactate, AGMA. She was also significantly dehydrated on ED presentation.  BP remained stable throughout. PCCM asked to see in consultation given AKI, lactic acidosis.  PCCM reconsulted 9/11 for increased WOB, tachypnea new O2 requirement.  Pertinent Medical History:   Past Medical History:  Diagnosis Date   Alcoholism (HCC) 2011   Asthma    Chronic lower back pain    Colitis 04/2014.   Bloody diarrhea   Daily headache    GIB (gastrointestinal bleeding)    Hypertension    Pancreatitis    Chronic pancreatitis noted on CT scan in 04/2014.   Polysubstance abuse (HCC) 2011   Pyelonephritis 05/2012   Seizures (HCC)    last seizure was 04/2019 per patient    Significant Hospital Events: Including procedures, antibiotic start and stop dates in addition to other pertinent events   9/9 Admitted. PCCM consulted for possible ICU needs, given hypotension. Fortunately fluid responsive. AKI presumed prerenal in the setting of dehydration/UTI? BCx resulted +E.coli. Ceftriaxone. 9/11 PCCM called back for increased WOB, tachypnea to 30s-40s. CXR wet-appearing with +pulmonary edema.  On Lasix diuresis 9/12 stool test positive for STEC, STEC-HUS being considered 9/14 given 1 dose of eculizumab, made DNR after discussion with palliative care 9/16 no pressor or O2 requirement, stable, remains encephalopathic  Interim  History/Subjective:   No overnight events, Pt moaning but does not appear to be in severe distress  Objective:  Blood pressure (!) 144/82, pulse (!) 108, temperature 98.9 F (37.2 C), temperature source Axillary, resp. rate (!) 22, height 5\' 3"  (1.6 m), weight 45.6 kg, SpO2 99%.        Intake/Output Summary (Last 24 hours) at 10/21/2022 0841 Last data filed at 10/21/2022 0600 Gross per 24 hour  Intake 3072.51 ml  Output 1095 ml  Net 1977.51 ml   Filed Weights   10/18/22 0306 10/19/22 0136 10/20/22 0500  Weight: 48.2 kg 45.6 kg 45.6 kg     General:  chronically ill-appearing F moaning and laying in bed in no acute distress HEENT: MM pink/slightly dry, sclera anicteric Neuro: tracks and mumbles to questions, moves extremities to command CV: s1s2 rrr, no m/r/g PULM:  mildly diminished in the bilateral bases, no distress, rhonchi or wheezing on room air  GI: soft, bsx4 active  Extremities: warm/dry, no edema  Skin: no rashes or lesions    Lab/imaging reviewed Potassium 2.9, BUN/creatinine 75/3.65 Total bilirubin 4.9 WBC count increased to 22.4, hemoglobin 6.9, platelets 19   Resolved problems: Hyponatremia  Hypoxic Respiratory failure  Assessment & Plan:    Respiratory distress Improved and on RA today -can likely transfer out of ICU today, no acute critical care needs  Sepsis secondary to E. coli bacteremia  Gastroenteritis with Shiga producing E. Coli in stool panel -Continue ceftriaxone as the E. coli is pansensitive -Keep net even -Cannot get PICC line due to recent bacteremia. 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  Discussed with hematology.  This effect should last for 7 days.  Acute on Chronic thrombocytopenia 2/2  EtOH and hepatic steatosis and possibly HUS Seen by oncology and received Monoclonal Ab infusion 9/14 -Follow platelets.  Transfuse for platelets less than 10 or if she is bleeding <20  Anemia of chronic disease, hemolysis Transfuse for hemoglobin less than 7 Monitor CBC  EtOH abuse history - Out of withdrawal window  - Limit benzos as she is well sedated - Continue thiamine - MV as able  Severe malnutrition -Titrate tube feeds  Goals of care Made DNR 9/15.  Family does not want vasopressors or dialysis.  If she declines or does not respond to eculizumab then consider comfort measures  Best practice (evaluated daily):   Diet/type: Tube feeds DVT prophylaxis: SCD, Holding chemical prophylaxis due to thrombocytopenia GI prophylaxis: N/A Lines: Central line Foley:  Yes, and it is still needed Code Status:  full code Last date of multidisciplinary goals of care discussion [] . See above  Critical care time:      Darcella Gasman Winry Egnew, PA-C [facility] See Amion for pager If no response to pager , please call [phone] until 7pm After 7:00 pm call Elink  [phone]

## 2022-10-21 NOTE — Progress Notes (Signed)
Upper extremity venous duplex partially completed. Please see CV Procedures for preliminary results. Unable to examine fully due to patient refusal and combativeness.  Shona Simpson, RVT 10/21/22 1:50 PM

## 2022-10-21 NOTE — Progress Notes (Signed)
Pt's brother Renae Gloss updated with plan of care.   Darcella Gasman Sydney Azure, PA-C

## 2022-10-21 NOTE — Progress Notes (Signed)
SLP Cancellation Note  Patient Details Name: Brandy Bishop MRN: [id number] DOB: December 20, 1960   Cancelled treatment:    Pt's mentation remains prohibitive for PO intake. D/W RN.  SLP will follow along.  [name] L. Samson Frederic, MA CCC/SLP Clinical Specialist - Acute Care SLP Acute Rehabilitation Services Office number [phone]        Brandy Bishop 10/21/2022, 9:42 AM

## 2022-10-21 NOTE — Progress Notes (Signed)
[facility] NEPHROLOGY PROGRESS NOTE  Assessment/ Plan: Pt is a 62 y.o. yo female with medical history of alcohol abuse, liver cirrhosis, thrombocytopenia, hypertension, MDD who was admitted on 9/9 with seizure, sepsis due to E. coli bacteremia and AKI.  # Sepsis secondary due to Shiga like toxin E. coli bacteremia: Currently on ceftriaxone and supportive treatment.  In ICU.  # Nonoliguric AKI likely ischemic ATN in the setting of sepsis/ongoing GI loss, poor oral intake with intravascular depletion.  Concern for HUS given STEC diagnosis. S/p one dose of eculizumab on 9/14 -Cr is normal at baseline -Continue to hold diuretics, agree with gentle IV hydration. Cr stable today 3.6, nonoliguric -Not a candidate for dialysis given underlying comorbidities.  Continue current supportive care.  Palliative care following  # Shiga like toxin associated with E. coli bacteremia: Concern for HUS.  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 Receiving ceftriaxone.  Meningococcal vaccination before the infusion.  # Alcohol abuse, liver cirrhosis, protein calorie malnutrition/seizure: Currently on thiamine, CIWA protocol in ICU.  Remains encephalopathic.  # Severe anemia, thrombocytopenia: PRBC/plt transfusion per primary service.  Monitor lab.  # Hypokalemia: Replete potassium chloride and Mag PRN  Overall poor prognosis and recommend hospice and comfort care. Palliative care following. Now DNR Family does not want pressors or dialysis.  Subjective: Seen and examined ICU.  She remains delirious and confused.  Urine output is around 1L but no overall improvement in mental status.  Noted seen by palliative care team.  Objective Vital signs in last 24 hours: Vitals:   10/21/22 0500 10/21/22 0600 10/21/22 0700 10/21/22 0711  BP: 122/69 (!) 144/82    Pulse: (!) 117 (!) 115 (!) 108   Resp: (!) 27 (!) 25 (!) 22   Temp:    98.9 F (37.2 C)  TempSrc:     Axillary  SpO2: 95% 98% 99%   Weight:      Height:       Weight change:   Intake/Output Summary (Last 24 hours) at 10/21/2022 0917 Last data filed at 10/21/2022 0600 Gross per 24 hour  Intake 2979.84 ml  Output 1095 ml  Net 1884.84 ml       Labs: RENAL PANEL Recent Labs  Lab 10/18/22 0259 10/18/22 1305 10/18/22 1756 10/18/22 1817 10/19/22 0211 10/19/22 1739 10/20/22 0640 10/20/22 1735 10/21/22 0326  NA 137 138 141  --  141  --  143  --  142  K 3.2* 3.2* 3.1*  --  3.5  --  2.9*  --  4.7  CL 99 98  --   --  98  --  104  --  106  CO2 27 27  --   --  27  --  28  --  26  GLUCOSE 95 235*  --   --  121*  --  119*  --  118*  BUN 73* 69*  --   --  72*  --  75*  --  77*  CREATININE 3.61* 3.41*  --   --  3.61*  --  3.65*  --  3.62*  CALCIUM 8.0* 7.9*  --   --  8.4*  --  8.2*  --  8.4*  MG  --  1.8  --  1.7 1.7 1.6* 1.7 1.6* 1.6*  PHOS  --  3.0  --  2.7 3.5 2.8 2.8 2.5  --   ALBUMIN 1.6* 1.7*  --   --  2.3*  --  1.8*  --  1.8*    Liver Function Tests: Recent Labs  Lab 10/19/22 0211 10/20/22 0640 10/21/22 0326  AST 65* 33 46*  ALT 27 20 21   ALKPHOS 147* 123 180*  BILITOT 6.2* 4.9* 6.5*  PROT 5.5* 5.1* 5.3*  ALBUMIN 2.3* 1.8* 1.8*   No results for input(s): "LIPASE", "AMYLASE" in the last 168 hours. No results for input(s): "AMMONIA" in the last 168 hours. CBC: Recent Labs    10/15/22 0540 10/15/22 2035 10/16/22 0600 10/19/22 0211 10/19/22 0445 10/20/22 0640 10/20/22 1735 10/21/22 0326  HGB 8.0* 7.2*   < > 8.6* 7.8* 6.9* 8.2* 8.5*  MCV 91.6 90.6   < > 88.1  --  88.9  --  87.0  VITAMINB12 894  --   --   --   --   --   --   --   FOLATE 13.5  --   --   --   --   --   --   --   FERRITIN 590*  --   --   --   --   --   --   --   TIBC 126*  --   --   --   --   --   --   --   IRON 6*  --   --   --   --   --   --   --   RETICCTPCT  --  0.6  --   --   --   --   --   --    < > = values in this interval not displayed.    Cardiac Enzymes: Recent Labs  Lab  10/14/22 2134  CKTOTAL 541*   CBG: Recent Labs  Lab 10/20/22 1520 10/20/22 1914 10/20/22 2308 10/21/22 0313 10/21/22 0707  GLUCAP 144* 127* 117* 109* 148*    Iron Studies: No results for input(s): "IRON", "TIBC", "TRANSFERRIN", "FERRITIN" in the last 72 hours. Studies/Results: [place] EKG SITE RITE  Result Date: 10/20/2022 If Site Rite image not attached, placement could not be confirmed due to current cardiac rhythm.   Medications: Infusions:  cefTRIAXone (ROCEPHIN)  IV Stopped (10/20/22 1134)   dextrose 5% lactated ringers 60 mL/hr at 10/21/22 0500   feeding supplement (OSMOLITE 1.2 CAL) 1,000 mL (10/21/22 0631)   magnesium sulfate bolus IVPB     thiamine (VITAMIN B1) injection Stopped (10/20/22 1054)    Scheduled Medications:  Chlorhexidine Gluconate Cloth  6 each Topical Q0600   folic acid  1 mg Per Tube Daily   mirtazapine  7.5 mg Per Tube QHS   multivitamin with minerals  1 tablet Per Tube Daily   mouth rinse  15 mL Mouth Rinse 4 times per day   pantoprazole (PROTONIX) IV  40 mg Intravenous Daily   [START ON 10/22/2022] thiamine  100 mg Oral Daily    have reviewed scheduled and prn medications.  Physical Exam: General: chronically ill looking female, delirious and confused. Heart:RRR, s1s2 nl Lungs: Basal decreased breath sound. Normal WOB, bilateral chest expansion Abdomen:soft, Non-tender, non-distended Extremities:No edema Neurology: Encephalopathic and delirious.  Arrow Emmerich 10/21/2022,9:17 AM  LOS: 7 days

## 2022-10-22 ENCOUNTER — Inpatient Hospital Stay (HOSPITAL_COMMUNITY): Payer: MEDICAID

## 2022-10-22 DIAGNOSIS — R6520 Severe sepsis without septic shock: Secondary | ICD-10-CM | POA: Diagnosis not present

## 2022-10-22 DIAGNOSIS — A419 Sepsis, unspecified organism: Secondary | ICD-10-CM | POA: Diagnosis not present

## 2022-10-22 LAB — GLUCOSE, CAPILLARY
Glucose-Capillary: 91 mg/dL (ref 70–99)
Glucose-Capillary: 115 mg/dL — ABNORMAL HIGH (ref 70–99)
Glucose-Capillary: 69 mg/dL — ABNORMAL LOW (ref 70–99)
Glucose-Capillary: 91 mg/dL (ref 70–99)
Glucose-Capillary: 117 mg/dL — ABNORMAL HIGH (ref 70–99)

## 2022-10-22 LAB — CBC WITH DIFFERENTIAL/PLATELET
Abs Immature Granulocytes: 0.47 10*3/uL — ABNORMAL HIGH (ref 0.00–0.07)
Basophils Absolute: 0.1 10*3/uL (ref 0.0–0.1)
Basophils Relative: 0 %
Eosinophils Absolute: 0.1 10*3/uL (ref 0.0–0.5)
Eosinophils Relative: 0 %
HCT: 28.4 % — ABNORMAL LOW (ref 36.0–46.0)
Hemoglobin: 9.8 g/dL — ABNORMAL LOW (ref 12.0–15.0)
Immature Granulocytes: 2 %
Lymphocytes Relative: 9 %
Lymphs Abs: 2.5 10*3/uL (ref 0.7–4.0)
MCH: 30.3 pg (ref 26.0–34.0)
MCHC: 34.5 g/dL (ref 30.0–36.0)
MCV: 87.9 fL (ref 80.0–100.0)
Monocytes Absolute: 1.8 10*3/uL — ABNORMAL HIGH (ref 0.1–1.0)
Monocytes Relative: 7 %
Neutro Abs: 21.9 10*3/uL — ABNORMAL HIGH (ref 1.7–7.7)
Neutrophils Relative %: 82 %
Platelets: 24 10*3/uL — CL (ref 150–400)
RBC: 3.23 MIL/uL — ABNORMAL LOW (ref 3.87–5.11)
RDW: 17.3 % — ABNORMAL HIGH (ref 11.5–15.5)
WBC: 26.8 10*3/uL — ABNORMAL HIGH (ref 4.0–10.5)
nRBC: 0 % (ref 0.0–0.2)

## 2022-10-22 LAB — COMPREHENSIVE METABOLIC PANEL
ALT: 20 U/L (ref 0–44)
AST: 41 U/L (ref 15–41)
Albumin: 1.9 g/dL — ABNORMAL LOW (ref 3.5–5.0)
Alkaline Phosphatase: 188 U/L — ABNORMAL HIGH (ref 38–126)
Anion gap: 12 (ref 5–15)
BUN: 84 mg/dL — ABNORMAL HIGH (ref 8–23)
CO2: 28 mmol/L (ref 22–32)
Calcium: 8.7 mg/dL — ABNORMAL LOW (ref 8.9–10.3)
Chloride: 103 mmol/L (ref 98–111)
Creatinine, Ser: 3.7 mg/dL — ABNORMAL HIGH (ref 0.44–1.00)
GFR, Estimated: 13 mL/min — ABNORMAL LOW (ref >60)
Glucose, Bld: 112 mg/dL — ABNORMAL HIGH (ref 70–99)
Potassium: 4.4 mmol/L (ref 3.5–5.1)
Sodium: 143 mmol/L (ref 135–145)
Total Bilirubin: 6.7 mg/dL — ABNORMAL HIGH (ref 0.3–1.2)
Total Protein: 6 g/dL — ABNORMAL LOW (ref 6.5–8.1)

## 2022-10-22 LAB — MAGNESIUM: Magnesium: 2.7 mg/dL — ABNORMAL HIGH (ref 1.7–2.4)

## 2022-10-22 MED ORDER — THIAMINE MONONITRATE 100 MG PO TABS
100.0000 mg | ORAL_TABLET | Freq: Every day | ORAL | Status: DC
  Administered 2022-10-22 – 2022-10-26 (×5): 100 mg
  Filled 2022-10-22 (×5): qty 1

## 2022-10-22 MED ORDER — SODIUM CHLORIDE 0.9% FLUSH
10.0000 mL | Freq: Two times a day (BID) | INTRAVENOUS | Status: DC
  Administered 2022-10-22: 20 mL
  Administered 2022-10-22 – 2022-10-27 (×9): 10 mL

## 2022-10-22 MED ORDER — HALOPERIDOL LACTATE 5 MG/ML IJ SOLN
5.0000 mg | Freq: Once | INTRAMUSCULAR | Status: AC | PRN
  Administered 2022-10-22: 5 mg via INTRAMUSCULAR
  Filled 2022-10-22: qty 1

## 2022-10-22 MED ORDER — SODIUM CHLORIDE 0.9% FLUSH: 10.0000 mL | INTRAVENOUS | Status: DC | PRN

## 2022-10-22 MED ORDER — TRAMADOL HCL 50 MG PO TABS
50.0000 mg | ORAL_TABLET | Freq: Four times a day (QID) | ORAL | Status: DC | PRN
  Administered 2022-10-22 – 2022-10-25 (×3): 50 mg
  Filled 2022-10-22 (×3): qty 1

## 2022-10-22 NOTE — Progress Notes (Signed)
optimal due to an inadequate volume of blood received in culture bottles   Culture   Final    NO GROWTH 2 DAYS Performed at [facility], [address], [place], [place] [address]    Report Status PENDING  Incomplete         Radiology Studies: VAS [place] UPPER EXTREMITY VENOUS DUPLEX  Result Date: 10/21/2022 UPPER VENOUS STUDY  Patient Name:  Brandy Bishop  Date of Exam:   10/21/2022 Medical Rec #: [id number]           Accession #:    [id number] Date of Birth: [birth year]           Patient Gender: F Patient Age:   62 years Exam Location:  [facility] Procedure:      VAS [place] UPPER EXTREMITY VENOUS DUPLEX Referring Phys: Emilie Rutter --------------------------------------------------------------------------------  Indications: Swelling Risk Factors: Immobility. Limitations: Limited range of motion, movement, mid-exam refusal. Comparison Study: No prior study Performing Technologist: Shona Simpson  Examination Guidelines: A complete evaluation includes B-mode imaging, spectral Doppler, color Doppler, and power Doppler as needed of all accessible portions of each vessel. Bilateral testing is considered an  integral part of a complete examination. Limited examinations for reoccurring indications may be performed as noted.  Right Findings: +----------+------------+---------+-----------+----------+--------------+ RIGHT     CompressiblePhasicitySpontaneousProperties   Summary     +----------+------------+---------+-----------+----------+--------------+ IJV                                                 Not visualized +----------+------------+---------+-----------+----------+--------------+ Subclavian    Full       Yes       Yes                             +----------+------------+---------+-----------+----------+--------------+ Axillary      Full       Yes       Yes                             +----------+------------+---------+-----------+----------+--------------+ Brachial      Full       Yes       Yes                             +----------+------------+---------+-----------+----------+--------------+ Radial                                              Not visualized +----------+------------+---------+-----------+----------+--------------+ Ulnar                                               Not visualized +----------+------------+---------+-----------+----------+--------------+ Cephalic                                            Not visualized +----------+------------+---------+-----------+----------+--------------+ Basilic  PROGRESS NOTE    Brandy Bishop  WUJ:[id number] DOB: May 22, 1960 DOA: 10/14/2022 PCP: Cline Crock, NP    Brief Narrative:   Brandy Bishop is a 62 y.o. female with past medical history significant for HTN, asthma, seizure disorder, chronic pancreatitis, chronic low back pain, EtOH use disorder who presented to [facility] ED on 9/9 via EMS from home with confusion, generalized bodyaches, feeling ill for the past 3-4 days.  Given her confusion, history obtained from chart and discussion with her significant other.  Symptoms started roughly 4 days prior with decreased ability to walk, generalized weakness and feeling sore all over.  Also reported nausea and vomiting as well as diarrhea.  Decreased appetite.  Given that her symptoms became progressive with further confusion EMS was called and patient was brought to the ED for further evaluation.  Family notes significant alcohol use with last drink day prior to ED presentation.  Workup in the emergency department with findings significant for acute renal failure, hypokalemia, hyponatremia, elevated lactic acid, anion gap metabolic acidosis.  PCCM was consulted given AKI, lactic acidosis and recommended hospitalist admission.   Significant Hospital events: 9/9: Admit. PCCM consulted for possible ICU needs, given hypotension. Fortunately fluid responsive. AKI presumed prerenal in the setting of dehydration/UTI? BCx resulted +E.coli. Ceftriaxone. 9/11: PCCM called back for increased WOB, tachypnea to 30s-40s. CXR wet-appearing with +pulmonary edema.  On Lasix diuresis 9/12: stool test positive for STEC, STEC-HUS being considered 9/14: Oncology consulted, given 1 dose of eculizumab, made DNR after discussion with palliative care 9/16: no pressor or O2 requirement, stable, remains encephalopathic 9/17: Transferred to the hospitalist service   Assessment & Plan:   Acute metabolic encephalopathy Sepsis secondary to E. coli  bacteremia Gastroenteritis secondary to Shigella producing E. Coli Patient presenting to ED via EMS after being found confused, with progressive weakness associated with nausea/vomiting/diarrhea 4 days preceding hospitalization.  Temperature on admission 103.1 F rectally, HR 125, BP 108/74.  WBC count 17.2, lactic acid 4.4.  MRSA PCR negative.  Urinalysis with moderate leukocytes, many bacteria, greater than 50 WBCs.  C. difficile negative.  GI PCR panel positive for Shiga like toxin producing E. coli.  Blood cultures positive for E. Coli; pansensitive.  CT head without contrast with no evidence of acute intracranial abnormality.  CT abdomen/pelvis with findings of bilateral perinephric stranding, no hydronephrosis, moderate air and fluid distention of the colon consistent with diarrheal illness, hepatic steatosis.   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  Also concern for HUS given STC diagnosis. -- Nephrology following, appreciate assistance; not a candidate for hemodialysis due to comorbidities -- Cr 5.37>>>>3.65>3.62>3.70  Acute on chronic thrombocytopenia complicated by severe alcohol use disorder, hepatic steatosis and possible HUS Patient seen by medical oncology, Dr. Leonides Schanz and received eculizumab infusion on 10/19/2022. -- Platelet count up to 24 today -- Transfuse for platelets less than 10 or less than 20 if active hemorrhage  Anemia of chronic medical disease -- Hemoglobin 9.8, stable  EtOH use disorder Currently now out of withdrawal window.  Recommend limiting benzodiazepines as she is still altered/confused --Continue thiamine, multivitamin  and folate   Right upper extremity edema, pain Vascular duplex  PROGRESS NOTE    Brandy Bishop  WUJ:[id number] DOB: May 22, 1960 DOA: 10/14/2022 PCP: Cline Crock, NP    Brief Narrative:   Brandy Bishop is a 62 y.o. female with past medical history significant for HTN, asthma, seizure disorder, chronic pancreatitis, chronic low back pain, EtOH use disorder who presented to [facility] ED on 9/9 via EMS from home with confusion, generalized bodyaches, feeling ill for the past 3-4 days.  Given her confusion, history obtained from chart and discussion with her significant other.  Symptoms started roughly 4 days prior with decreased ability to walk, generalized weakness and feeling sore all over.  Also reported nausea and vomiting as well as diarrhea.  Decreased appetite.  Given that her symptoms became progressive with further confusion EMS was called and patient was brought to the ED for further evaluation.  Family notes significant alcohol use with last drink day prior to ED presentation.  Workup in the emergency department with findings significant for acute renal failure, hypokalemia, hyponatremia, elevated lactic acid, anion gap metabolic acidosis.  PCCM was consulted given AKI, lactic acidosis and recommended hospitalist admission.   Significant Hospital events: 9/9: Admit. PCCM consulted for possible ICU needs, given hypotension. Fortunately fluid responsive. AKI presumed prerenal in the setting of dehydration/UTI? BCx resulted +E.coli. Ceftriaxone. 9/11: PCCM called back for increased WOB, tachypnea to 30s-40s. CXR wet-appearing with +pulmonary edema.  On Lasix diuresis 9/12: stool test positive for STEC, STEC-HUS being considered 9/14: Oncology consulted, given 1 dose of eculizumab, made DNR after discussion with palliative care 9/16: no pressor or O2 requirement, stable, remains encephalopathic 9/17: Transferred to the hospitalist service   Assessment & Plan:   Acute metabolic encephalopathy Sepsis secondary to E. coli  bacteremia Gastroenteritis secondary to Shigella producing E. Coli Patient presenting to ED via EMS after being found confused, with progressive weakness associated with nausea/vomiting/diarrhea 4 days preceding hospitalization.  Temperature on admission 103.1 F rectally, HR 125, BP 108/74.  WBC count 17.2, lactic acid 4.4.  MRSA PCR negative.  Urinalysis with moderate leukocytes, many bacteria, greater than 50 WBCs.  C. difficile negative.  GI PCR panel positive for Shiga like toxin producing E. coli.  Blood cultures positive for E. Coli; pansensitive.  CT head without contrast with no evidence of acute intracranial abnormality.  CT abdomen/pelvis with findings of bilateral perinephric stranding, no hydronephrosis, moderate air and fluid distention of the colon consistent with diarrheal illness, hepatic steatosis.   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  Also concern for HUS given STC diagnosis. -- Nephrology following, appreciate assistance; not a candidate for hemodialysis due to comorbidities -- Cr 5.37>>>>3.65>3.62>3.70  Acute on chronic thrombocytopenia complicated by severe alcohol use disorder, hepatic steatosis and possible HUS Patient seen by medical oncology, Dr. Leonides Schanz and received eculizumab infusion on 10/19/2022. -- Platelet count up to 24 today -- Transfuse for platelets less than 10 or less than 20 if active hemorrhage  Anemia of chronic medical disease -- Hemoglobin 9.8, stable  EtOH use disorder Currently now out of withdrawal window.  Recommend limiting benzodiazepines as she is still altered/confused --Continue thiamine, multivitamin  and folate   Right upper extremity edema, pain Vascular duplex  optimal due to an inadequate volume of blood received in culture bottles   Culture   Final    NO GROWTH 2 DAYS Performed at [facility], [address], [place], [place] [address]    Report Status PENDING  Incomplete         Radiology Studies: VAS [place] UPPER EXTREMITY VENOUS DUPLEX  Result Date: 10/21/2022 UPPER VENOUS STUDY  Patient Name:  Brandy Bishop  Date of Exam:   10/21/2022 Medical Rec #: [id number]           Accession #:    [id number] Date of Birth: [birth year]           Patient Gender: F Patient Age:   62 years Exam Location:  [facility] Procedure:      VAS [place] UPPER EXTREMITY VENOUS DUPLEX Referring Phys: Emilie Rutter --------------------------------------------------------------------------------  Indications: Swelling Risk Factors: Immobility. Limitations: Limited range of motion, movement, mid-exam refusal. Comparison Study: No prior study Performing Technologist: Shona Simpson  Examination Guidelines: A complete evaluation includes B-mode imaging, spectral Doppler, color Doppler, and power Doppler as needed of all accessible portions of each vessel. Bilateral testing is considered an  integral part of a complete examination. Limited examinations for reoccurring indications may be performed as noted.  Right Findings: +----------+------------+---------+-----------+----------+--------------+ RIGHT     CompressiblePhasicitySpontaneousProperties   Summary     +----------+------------+---------+-----------+----------+--------------+ IJV                                                 Not visualized +----------+------------+---------+-----------+----------+--------------+ Subclavian    Full       Yes       Yes                             +----------+------------+---------+-----------+----------+--------------+ Axillary      Full       Yes       Yes                             +----------+------------+---------+-----------+----------+--------------+ Brachial      Full       Yes       Yes                             +----------+------------+---------+-----------+----------+--------------+ Radial                                              Not visualized +----------+------------+---------+-----------+----------+--------------+ Ulnar                                               Not visualized +----------+------------+---------+-----------+----------+--------------+ Cephalic                                            Not visualized +----------+------------+---------+-----------+----------+--------------+ Basilic  PROGRESS NOTE    Brandy Bishop  WUJ:[id number] DOB: May 22, 1960 DOA: 10/14/2022 PCP: Cline Crock, NP    Brief Narrative:   Brandy Bishop is a 62 y.o. female with past medical history significant for HTN, asthma, seizure disorder, chronic pancreatitis, chronic low back pain, EtOH use disorder who presented to [facility] ED on 9/9 via EMS from home with confusion, generalized bodyaches, feeling ill for the past 3-4 days.  Given her confusion, history obtained from chart and discussion with her significant other.  Symptoms started roughly 4 days prior with decreased ability to walk, generalized weakness and feeling sore all over.  Also reported nausea and vomiting as well as diarrhea.  Decreased appetite.  Given that her symptoms became progressive with further confusion EMS was called and patient was brought to the ED for further evaluation.  Family notes significant alcohol use with last drink day prior to ED presentation.  Workup in the emergency department with findings significant for acute renal failure, hypokalemia, hyponatremia, elevated lactic acid, anion gap metabolic acidosis.  PCCM was consulted given AKI, lactic acidosis and recommended hospitalist admission.   Significant Hospital events: 9/9: Admit. PCCM consulted for possible ICU needs, given hypotension. Fortunately fluid responsive. AKI presumed prerenal in the setting of dehydration/UTI? BCx resulted +E.coli. Ceftriaxone. 9/11: PCCM called back for increased WOB, tachypnea to 30s-40s. CXR wet-appearing with +pulmonary edema.  On Lasix diuresis 9/12: stool test positive for STEC, STEC-HUS being considered 9/14: Oncology consulted, given 1 dose of eculizumab, made DNR after discussion with palliative care 9/16: no pressor or O2 requirement, stable, remains encephalopathic 9/17: Transferred to the hospitalist service   Assessment & Plan:   Acute metabolic encephalopathy Sepsis secondary to E. coli  bacteremia Gastroenteritis secondary to Shigella producing E. Coli Patient presenting to ED via EMS after being found confused, with progressive weakness associated with nausea/vomiting/diarrhea 4 days preceding hospitalization.  Temperature on admission 103.1 F rectally, HR 125, BP 108/74.  WBC count 17.2, lactic acid 4.4.  MRSA PCR negative.  Urinalysis with moderate leukocytes, many bacteria, greater than 50 WBCs.  C. difficile negative.  GI PCR panel positive for Shiga like toxin producing E. coli.  Blood cultures positive for E. Coli; pansensitive.  CT head without contrast with no evidence of acute intracranial abnormality.  CT abdomen/pelvis with findings of bilateral perinephric stranding, no hydronephrosis, moderate air and fluid distention of the colon consistent with diarrheal illness, hepatic steatosis.   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  Also concern for HUS given STC diagnosis. -- Nephrology following, appreciate assistance; not a candidate for hemodialysis due to comorbidities -- Cr 5.37>>>>3.65>3.62>3.70  Acute on chronic thrombocytopenia complicated by severe alcohol use disorder, hepatic steatosis and possible HUS Patient seen by medical oncology, Dr. Leonides Schanz and received eculizumab infusion on 10/19/2022. -- Platelet count up to 24 today -- Transfuse for platelets less than 10 or less than 20 if active hemorrhage  Anemia of chronic medical disease -- Hemoglobin 9.8, stable  EtOH use disorder Currently now out of withdrawal window.  Recommend limiting benzodiazepines as she is still altered/confused --Continue thiamine, multivitamin  and folate   Right upper extremity edema, pain Vascular duplex  PROGRESS NOTE    Brandy Bishop  WUJ:[id number] DOB: May 22, 1960 DOA: 10/14/2022 PCP: Cline Crock, NP    Brief Narrative:   Brandy Bishop is a 62 y.o. female with past medical history significant for HTN, asthma, seizure disorder, chronic pancreatitis, chronic low back pain, EtOH use disorder who presented to [facility] ED on 9/9 via EMS from home with confusion, generalized bodyaches, feeling ill for the past 3-4 days.  Given her confusion, history obtained from chart and discussion with her significant other.  Symptoms started roughly 4 days prior with decreased ability to walk, generalized weakness and feeling sore all over.  Also reported nausea and vomiting as well as diarrhea.  Decreased appetite.  Given that her symptoms became progressive with further confusion EMS was called and patient was brought to the ED for further evaluation.  Family notes significant alcohol use with last drink day prior to ED presentation.  Workup in the emergency department with findings significant for acute renal failure, hypokalemia, hyponatremia, elevated lactic acid, anion gap metabolic acidosis.  PCCM was consulted given AKI, lactic acidosis and recommended hospitalist admission.   Significant Hospital events: 9/9: Admit. PCCM consulted for possible ICU needs, given hypotension. Fortunately fluid responsive. AKI presumed prerenal in the setting of dehydration/UTI? BCx resulted +E.coli. Ceftriaxone. 9/11: PCCM called back for increased WOB, tachypnea to 30s-40s. CXR wet-appearing with +pulmonary edema.  On Lasix diuresis 9/12: stool test positive for STEC, STEC-HUS being considered 9/14: Oncology consulted, given 1 dose of eculizumab, made DNR after discussion with palliative care 9/16: no pressor or O2 requirement, stable, remains encephalopathic 9/17: Transferred to the hospitalist service   Assessment & Plan:   Acute metabolic encephalopathy Sepsis secondary to E. coli  bacteremia Gastroenteritis secondary to Shigella producing E. Coli Patient presenting to ED via EMS after being found confused, with progressive weakness associated with nausea/vomiting/diarrhea 4 days preceding hospitalization.  Temperature on admission 103.1 F rectally, HR 125, BP 108/74.  WBC count 17.2, lactic acid 4.4.  MRSA PCR negative.  Urinalysis with moderate leukocytes, many bacteria, greater than 50 WBCs.  C. difficile negative.  GI PCR panel positive for Shiga like toxin producing E. coli.  Blood cultures positive for E. Coli; pansensitive.  CT head without contrast with no evidence of acute intracranial abnormality.  CT abdomen/pelvis with findings of bilateral perinephric stranding, no hydronephrosis, moderate air and fluid distention of the colon consistent with diarrheal illness, hepatic steatosis.   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  Also concern for HUS given STC diagnosis. -- Nephrology following, appreciate assistance; not a candidate for hemodialysis due to comorbidities -- Cr 5.37>>>>3.65>3.62>3.70  Acute on chronic thrombocytopenia complicated by severe alcohol use disorder, hepatic steatosis and possible HUS Patient seen by medical oncology, Dr. Leonides Schanz and received eculizumab infusion on 10/19/2022. -- Platelet count up to 24 today -- Transfuse for platelets less than 10 or less than 20 if active hemorrhage  Anemia of chronic medical disease -- Hemoglobin 9.8, stable  EtOH use disorder Currently now out of withdrawal window.  Recommend limiting benzodiazepines as she is still altered/confused --Continue thiamine, multivitamin  and folate   Right upper extremity edema, pain Vascular duplex  optimal due to an inadequate volume of blood received in culture bottles   Culture   Final    NO GROWTH 2 DAYS Performed at [facility], [address], [place], [place] [address]    Report Status PENDING  Incomplete         Radiology Studies: VAS [place] UPPER EXTREMITY VENOUS DUPLEX  Result Date: 10/21/2022 UPPER VENOUS STUDY  Patient Name:  Brandy Bishop  Date of Exam:   10/21/2022 Medical Rec #: [id number]           Accession #:    [id number] Date of Birth: [birth year]           Patient Gender: F Patient Age:   62 years Exam Location:  [facility] Procedure:      VAS [place] UPPER EXTREMITY VENOUS DUPLEX Referring Phys: Emilie Rutter --------------------------------------------------------------------------------  Indications: Swelling Risk Factors: Immobility. Limitations: Limited range of motion, movement, mid-exam refusal. Comparison Study: No prior study Performing Technologist: Shona Simpson  Examination Guidelines: A complete evaluation includes B-mode imaging, spectral Doppler, color Doppler, and power Doppler as needed of all accessible portions of each vessel. Bilateral testing is considered an  integral part of a complete examination. Limited examinations for reoccurring indications may be performed as noted.  Right Findings: +----------+------------+---------+-----------+----------+--------------+ RIGHT     CompressiblePhasicitySpontaneousProperties   Summary     +----------+------------+---------+-----------+----------+--------------+ IJV                                                 Not visualized +----------+------------+---------+-----------+----------+--------------+ Subclavian    Full       Yes       Yes                             +----------+------------+---------+-----------+----------+--------------+ Axillary      Full       Yes       Yes                             +----------+------------+---------+-----------+----------+--------------+ Brachial      Full       Yes       Yes                             +----------+------------+---------+-----------+----------+--------------+ Radial                                              Not visualized +----------+------------+---------+-----------+----------+--------------+ Ulnar                                               Not visualized +----------+------------+---------+-----------+----------+--------------+ Cephalic                                            Not visualized +----------+------------+---------+-----------+----------+--------------+ Basilic  optimal due to an inadequate volume of blood received in culture bottles   Culture   Final    NO GROWTH 2 DAYS Performed at [facility], [address], [place], [place] [address]    Report Status PENDING  Incomplete         Radiology Studies: VAS [place] UPPER EXTREMITY VENOUS DUPLEX  Result Date: 10/21/2022 UPPER VENOUS STUDY  Patient Name:  Brandy Bishop  Date of Exam:   10/21/2022 Medical Rec #: [id number]           Accession #:    [id number] Date of Birth: [birth year]           Patient Gender: F Patient Age:   62 years Exam Location:  [facility] Procedure:      VAS [place] UPPER EXTREMITY VENOUS DUPLEX Referring Phys: Emilie Rutter --------------------------------------------------------------------------------  Indications: Swelling Risk Factors: Immobility. Limitations: Limited range of motion, movement, mid-exam refusal. Comparison Study: No prior study Performing Technologist: Shona Simpson  Examination Guidelines: A complete evaluation includes B-mode imaging, spectral Doppler, color Doppler, and power Doppler as needed of all accessible portions of each vessel. Bilateral testing is considered an  integral part of a complete examination. Limited examinations for reoccurring indications may be performed as noted.  Right Findings: +----------+------------+---------+-----------+----------+--------------+ RIGHT     CompressiblePhasicitySpontaneousProperties   Summary     +----------+------------+---------+-----------+----------+--------------+ IJV                                                 Not visualized +----------+------------+---------+-----------+----------+--------------+ Subclavian    Full       Yes       Yes                             +----------+------------+---------+-----------+----------+--------------+ Axillary      Full       Yes       Yes                             +----------+------------+---------+-----------+----------+--------------+ Brachial      Full       Yes       Yes                             +----------+------------+---------+-----------+----------+--------------+ Radial                                              Not visualized +----------+------------+---------+-----------+----------+--------------+ Ulnar                                               Not visualized +----------+------------+---------+-----------+----------+--------------+ Cephalic                                            Not visualized +----------+------------+---------+-----------+----------+--------------+ Basilic  PROGRESS NOTE    Brandy Bishop  WUJ:[id number] DOB: May 22, 1960 DOA: 10/14/2022 PCP: Cline Crock, NP    Brief Narrative:   Brandy Bishop is a 62 y.o. female with past medical history significant for HTN, asthma, seizure disorder, chronic pancreatitis, chronic low back pain, EtOH use disorder who presented to [facility] ED on 9/9 via EMS from home with confusion, generalized bodyaches, feeling ill for the past 3-4 days.  Given her confusion, history obtained from chart and discussion with her significant other.  Symptoms started roughly 4 days prior with decreased ability to walk, generalized weakness and feeling sore all over.  Also reported nausea and vomiting as well as diarrhea.  Decreased appetite.  Given that her symptoms became progressive with further confusion EMS was called and patient was brought to the ED for further evaluation.  Family notes significant alcohol use with last drink day prior to ED presentation.  Workup in the emergency department with findings significant for acute renal failure, hypokalemia, hyponatremia, elevated lactic acid, anion gap metabolic acidosis.  PCCM was consulted given AKI, lactic acidosis and recommended hospitalist admission.   Significant Hospital events: 9/9: Admit. PCCM consulted for possible ICU needs, given hypotension. Fortunately fluid responsive. AKI presumed prerenal in the setting of dehydration/UTI? BCx resulted +E.coli. Ceftriaxone. 9/11: PCCM called back for increased WOB, tachypnea to 30s-40s. CXR wet-appearing with +pulmonary edema.  On Lasix diuresis 9/12: stool test positive for STEC, STEC-HUS being considered 9/14: Oncology consulted, given 1 dose of eculizumab, made DNR after discussion with palliative care 9/16: no pressor or O2 requirement, stable, remains encephalopathic 9/17: Transferred to the hospitalist service   Assessment & Plan:   Acute metabolic encephalopathy Sepsis secondary to E. coli  bacteremia Gastroenteritis secondary to Shigella producing E. Coli Patient presenting to ED via EMS after being found confused, with progressive weakness associated with nausea/vomiting/diarrhea 4 days preceding hospitalization.  Temperature on admission 103.1 F rectally, HR 125, BP 108/74.  WBC count 17.2, lactic acid 4.4.  MRSA PCR negative.  Urinalysis with moderate leukocytes, many bacteria, greater than 50 WBCs.  C. difficile negative.  GI PCR panel positive for Shiga like toxin producing E. coli.  Blood cultures positive for E. Coli; pansensitive.  CT head without contrast with no evidence of acute intracranial abnormality.  CT abdomen/pelvis with findings of bilateral perinephric stranding, no hydronephrosis, moderate air and fluid distention of the colon consistent with diarrheal illness, hepatic steatosis.   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  Also concern for HUS given STC diagnosis. -- Nephrology following, appreciate assistance; not a candidate for hemodialysis due to comorbidities -- Cr 5.37>>>>3.65>3.62>3.70  Acute on chronic thrombocytopenia complicated by severe alcohol use disorder, hepatic steatosis and possible HUS Patient seen by medical oncology, Dr. Leonides Schanz and received eculizumab infusion on 10/19/2022. -- Platelet count up to 24 today -- Transfuse for platelets less than 10 or less than 20 if active hemorrhage  Anemia of chronic medical disease -- Hemoglobin 9.8, stable  EtOH use disorder Currently now out of withdrawal window.  Recommend limiting benzodiazepines as she is still altered/confused --Continue thiamine, multivitamin  and folate   Right upper extremity edema, pain Vascular duplex

## 2022-10-22 NOTE — Progress Notes (Signed)

## 2022-10-22 NOTE — Progress Notes (Signed)
SLP Cancellation Note  Patient Details Name: Brandy Bishop MRN: [id number] DOB: 1961-01-18   Cancelled treatment:       Reason Eval/Treat Not Completed: Medical issues which prohibited therapy;Patient's level of consciousness Spoke with RN who stated pt is slowly improving, but mentation still not allowing for PO trials. Will continue to follow.  Marline Backbone, B.S., Speech Therapy Student   Marline Backbone 10/22/2022, 1:10 PM

## 2022-10-22 NOTE — Progress Notes (Signed)
Nutrition Follow-Up  DOCUMENTATION CODES:   Severe malnutrition in context of chronic illness  INTERVENTION:   Continue enteral nutrition via Cortrak: - Osmolite 1.2 @ 55 ml/hr (1320 ml/day)  Tube feeding regimen provides 1584 kcal, 73 grams of protein, and 1082 ml of H2O.  - Continue MVI with minerals, folic acid, and thiamine  NUTRITION DIAGNOSIS:   Severe Malnutrition related to chronic illness (chronic pancreatitis, liver disease) as evidenced by severe fat depletion, severe muscle depletion.  Ongoing, being addressed via enteral nutrition  GOAL:   Patient will meet greater than or equal to 90% of their needs  Met via enteral nutrition  MONITOR:   Diet advancement, Labs, Weight trends, TF tolerance, I & O's  REASON FOR ASSESSMENT:   Consult Enteral/tube feeding initiation and management  ASSESSMENT:   62 year old female who presented to the ED on 9/09 with weakness, generalized body aches, and decreased PO intake for 2-3 days. PMH of EtOH abuse, chronic liver disease, chronic pancreatitis, seizures, HTN. Pt admitted with severe hypokalemia, AKI, anion gap metabolic acidosis, and severe sepsis.  9/10 - MBS with recommendations for full liquid diet (thin) 9/12 - transferred to ICU 9/13 - NPO, Cortrak placement (tip distal stomach)  Discussed pt with RN and during ICU rounds. Pt has been diagnosed with STEC gastroenteritis, pyelonephritis, and sepsis secondary to E. coli bacteremia.  Palliative Medicine is following. Pt is DNR/DNI, no vasopressors, no dialysis. Plan is to treat the treatable with time for outcomes.  Weight up compared to admission weight. Pt is net positive 7.1 L this admission. Pt with non-pitting generalized edema, moderate pitting edema to RUE, and non-pitting edema to LUE.  Admit weight: 45.3 kg Current weight: 50.8 kg  Current TF: Osmolite 1.2 @ 55 ml/hr  Medications reviewed and include: folic acid, remeron 7.5 mg daily, MVI with  minerals, IV protonix, thiamine 100 mg daily, IV abx  Labs reviewed: BUN 84, creatinine 3.70, magnesium 2.7, WBC 26.8, hemoglobin 9.8, platelets 24 CBG's: 69-115 x 24 hours  UOP: 585 ml + 2 unmeasured occurrences x 24 hours I/O's: +7.1 L since admit  Diet Order:   Diet Order             Diet NPO time specified  Diet effective now                   EDUCATION NEEDS:   Not appropriate for education at this time  Skin:  Skin Assessment: Reviewed RN Assessment  Last BM:  10/22/22 small type 6/type 7  Height:   Ht Readings from Last 1 Encounters:  10/14/22 5\' 3"  (1.6 m)    Weight:   Wt Readings from Last 1 Encounters:  10/22/22 50.8 kg    BMI:  Body mass index is 19.84 kg/m.  Estimated Nutritional Needs:   Kcal:  1550-1750  Protein:  70-80 grams  Fluid:  1.5-1.7 L    Mertie Clause, MS, RD, LDN Registered Dietitian II Please see AMiON for contact information.

## 2022-10-22 NOTE — Progress Notes (Signed)
eLink Physician-Brief Progress Note Patient Name: Brandy Bishop DOB: 1960-06-08 MRN: [id number]   Date of Service  10/22/2022  HPI/Events of Note  Patient very agitated. ripped out IV. Requesting something to help calm her down. Needs to be IM route. Currently calm.  QTc 0.44  eICU Interventions  Haloperidol 5 mg IM to be given prior to IV insertion     Intervention Category Minor Interventions: Agitation / anxiety - evaluation and management  Darl Pikes 10/22/2022, 4:52 AM

## 2022-10-22 NOTE — Progress Notes (Signed)
[facility] PROGRESS NOTE  Assessment/ Plan: Pt is a 62 y.o. yo female with medical history of alcohol abuse, liver cirrhosis, thrombocytopenia, hypertension, MDD who was admitted on 9/9 with seizure, sepsis due to E. coli bacteremia and AKI.  # Sepsis secondary due to Shiga like toxin E. coli bacteremia: Currently on ceftriaxone and supportive treatment.  In ICU.  # Nonoliguric AKI likely ischemic ATN in the setting of sepsis/ongoing GI loss, poor oral intake with intravascular depletion.  Concern for HUS given STEC diagnosis. S/p one dose of eculizumab on 9/14 -Cr is normal at baseline -Continue to hold diuretics. Cr stable today 3.7, nonoliguric. Receiving Tfs therefore holding off on reinitiation of fluids but would have a low threshold for gentle IVF's -Not a candidate for dialysis given underlying comorbidities.  Continue current supportive care.  Palliative care following  # Shiga like toxin associated with E. coli bacteremia: Concern for HUS.  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 Receiving ceftriaxone.  Meningococcal vaccination before the infusion.  # Alcohol abuse, liver cirrhosis, protein calorie malnutrition/seizure: Currently on thiamine, CIWA protocol in ICU.  Remains encephalopathic.  # Severe anemia, thrombocytopenia: PRBC/plt transfusion per primary service.  Monitor lab.  # Hypokalemia: Replete potassium chloride and Mag PRN  Overall poor prognosis and recommend hospice and comfort care. Palliative care following. Now DNR Family does not want pressors or dialysis.  Subjective: Seen and examined ICU.  Agitated overnight per charting, ripped out IV, received haldol.  Objective Vital signs in last 24 hours: Vitals:   10/22/22 0612 10/22/22 0700 10/22/22 0724 10/22/22 0800  BP: 128/74 138/76  (!) 87/79  Pulse: (!) 113 (!) 121 (!) 112 (!) 113  Resp: 19 20 (!) 22 (!) 25  Temp:   99 F (37.2 C)    TempSrc:   Axillary   SpO2: 97% 97% 99% 98%  Weight:      Height:       Weight change:   Intake/Output Summary (Last 24 hours) at 10/22/2022 0924 Last data filed at 10/22/2022 0700 Gross per 24 hour  Intake 1686.84 ml  Output 495 ml  Net 1191.84 ml       Labs: RENAL PANEL Recent Labs  Lab 10/18/22 1305 10/18/22 1756 10/18/22 1817 10/19/22 0211 10/19/22 1739 10/20/22 0640 10/20/22 1735 10/21/22 0326 10/22/22 0304  NA 138 141  --  141  --  143  --  142 143  K 3.2* 3.1*  --  3.5  --  2.9*  --  4.7 4.4  CL 98  --   --  98  --  104  --  106 103  CO2 27  --   --  27  --  28  --  26 28  GLUCOSE 235*  --   --  121*  --  119*  --  118* 112*  BUN 69*  --   --  72*  --  75*  --  77* 84*  CREATININE 3.41*  --   --  3.61*  --  3.65*  --  3.62* 3.70*  CALCIUM 7.9*  --   --  8.4*  --  8.2*  --  8.4* 8.7*  MG 1.8  --  1.7 1.7 1.6* 1.7 1.6* 1.6* 2.7*  PHOS 3.0  --  2.7 3.5 2.8 2.8 2.5  --   --   ALBUMIN 1.7*  --   --  2.3*  --  1.8*  --  1.8* 1.9*    Liver Function Tests: Recent Labs  Lab 10/20/22 0640 10/21/22 0326 10/22/22 0304  AST 33 46* 41  ALT 20 21 20   ALKPHOS 123 180* 188*  BILITOT 4.9* 6.5* 6.7*  PROT 5.1* 5.3* 6.0*  ALBUMIN 1.8* 1.8* 1.9*   No results for input(s): "LIPASE", "AMYLASE" in the last 168 hours. No results for input(s): "AMMONIA" in the last 168 hours. CBC: Recent Labs    10/15/22 0540 10/15/22 2035 10/16/22 0600 10/19/22 0445 10/20/22 0640 10/20/22 1735 10/21/22 0326 10/22/22 0304  HGB 8.0* 7.2*   < > 7.8* 6.9* 8.2* 8.5* 9.8*  MCV 91.6 90.6   < >  --  88.9  --  87.0 87.9  VITAMINB12 894  --   --   --   --   --   --   --   FOLATE 13.5  --   --   --   --   --   --   --   FERRITIN 590*  --   --   --   --   --   --   --   TIBC 126*  --   --   --   --   --   --   --   IRON 6*  --   --   --   --   --   --   --   RETICCTPCT  --  0.6  --   --   --   --   --   --    < > = values in this interval not displayed.    Cardiac Enzymes: No  results for input(s): "CKTOTAL", "CKMB", "CKMBINDEX", "TROPONINI" in the last 168 hours.  CBG: Recent Labs  Lab 10/21/22 1457 10/21/22 1950 10/21/22 2343 10/22/22 0335 10/22/22 0721  GLUCAP 116* 113* 89 91 115*    Iron Studies: No results for input(s): "IRON", "TIBC", "TRANSFERRIN", "FERRITIN" in the last 72 hours. Studies/Results: VAS [place] UPPER EXTREMITY VENOUS DUPLEX  Result Date: 10/21/2022 UPPER VENOUS STUDY  Patient Name:  Brandy Bishop  Date of Exam:   10/21/2022 Medical Rec #: [id number]           Accession #:    [id number] Date of Birth: Dec 29, 1960           Patient Gender: F Patient Age:   67 years Exam Location[facility]  [facility] Procedure:      VAS [place] UPPER EXTREMITY VENOUS DUPLEX Referring Phys: Emilie Rutter --------------------------------------------------------------------------------  Indications: Swelling Risk Factors: Immobility. Limitations: Limited range of motion, movement, mid-exam refusal. Comparison Study: No prior study Performing Technologist: Shona Simpson  Examination Guidelines: A complete evaluation includes B-mode imaging, spectral Doppler, color Doppler, and power Doppler as needed of all accessible portions of each vessel. Bilateral testing is considered an integral part of a complete examination. Limited examinations for reoccurring indications may be performed as noted.  Right Findings: +----------+------------+---------+-----------+----------+--------------+ RIGHT     CompressiblePhasicitySpontaneousProperties   Summary     +----------+------------+---------+-----------+----------+--------------+ IJV                                                 Not visualized +----------+------------+---------+-----------+----------+--------------+ Subclavian    Full       Yes       Yes                             +----------+------------+---------+-----------+----------+--------------+  Axillary      Full       Yes       Yes                              +----------+------------+---------+-----------+----------+--------------+ Brachial      Full       Yes       Yes                             +----------+------------+---------+-----------+----------+--------------+ Radial                                              Not visualized +----------+------------+---------+-----------+----------+--------------+ Ulnar                                               Not visualized +----------+------------+---------+-----------+----------+--------------+ Cephalic                                            Not visualized +----------+------------+---------+-----------+----------+--------------+ Basilic                                             Not visualized +----------+------------+---------+-----------+----------+--------------+  Left Findings: +----------+------------+---------+-----------+----------+--------------+ LEFT      CompressiblePhasicitySpontaneousProperties   Summary     +----------+------------+---------+-----------+----------+--------------+ IJV           Full       Yes       Yes                             +----------+------------+---------+-----------+----------+--------------+ Subclavian    Full       Yes       Yes                             +----------+------------+---------+-----------+----------+--------------+ Axillary      Full       Yes       Yes                             +----------+------------+---------+-----------+----------+--------------+ Brachial                                            Not visualized +----------+------------+---------+-----------+----------+--------------+ Radial                                              Not visualized +----------+------------+---------+-----------+----------+--------------+ Ulnar  Not visualized +----------+------------+---------+-----------+----------+--------------+  Cephalic                                            Not visualized +----------+------------+---------+-----------+----------+--------------+ Basilic                                             Not visualized +----------+------------+---------+-----------+----------+--------------+  Summary:  Right: No evidence of deep vein thrombosis in the upper extremity. Incomplete examination of arm.  Left: No evidence of deep vein thrombosis in the upper extremity. Incomplete examination of arm.  *See table(s) above for measurements and observations.  Diagnosing physician: Lemar Livings MD Electronically signed by Lemar Livings MD on 10/21/2022 at 4:46:01 PM.    Final     Medications: Infusions:  sodium chloride Stopped (10/21/22 1259)   cefTRIAXone (ROCEPHIN)  IV Stopped (10/21/22 1133)   feeding supplement (OSMOLITE 1.2 CAL) 55 mL/hr at 10/22/22 0700    Scheduled Medications:  Chlorhexidine Gluconate Cloth  6 each Topical Q0600   folic acid  1 mg Per Tube Daily   mirtazapine  7.5 mg Per Tube QHS   multivitamin with minerals  1 tablet Per Tube Daily   mouth rinse  15 mL Mouth Rinse 4 times per day   pantoprazole (PROTONIX) IV  40 mg Intravenous Daily   sodium chloride flush  10-40 mL Intracatheter Q12H   thiamine  100 mg Per Tube Daily    have reviewed scheduled and prn medications.  Physical Exam: General: chronically ill looking female, delirious and confused. Heart:RRR, s1s2 nl Lungs: Basal decreased breath sound. Normal WOB, bilateral chest expansion Abdomen:soft, Non-tender, non-distended Extremities:No edema Neurology: awake but encephalopathic and delirious.  Kambrey Hagger 10/22/2022,9:24 AM  LOS: 8 days

## 2022-10-22 NOTE — Progress Notes (Signed)
Daily Progress Note   Patient Name: Brandy Bishop       Date: 10/22/2022 DOB: 1960/08/30  Age: 63 y.o. MRN#: [id number] Attending Physician: [place], Eric J, DO Primary Care Physician: Cline Crock, NP Admit Date: 10/14/2022  Reason for Consultation/Follow-up: Establishing goals of care  Length of Stay: 8  Current Medications: Scheduled Meds:   Chlorhexidine Gluconate Cloth  6 each Topical Q0600   folic acid  1 mg Per Tube Daily   mirtazapine  7.5 mg Per Tube QHS   multivitamin with minerals  1 tablet Per Tube Daily   mouth rinse  15 mL Mouth Rinse 4 times per day   pantoprazole (PROTONIX) IV  40 mg Intravenous Daily   sodium chloride flush  10-40 mL Intracatheter Q12H   thiamine  100 mg Per Tube Daily    Continuous Infusions:  sodium chloride Stopped (10/22/22 1058)   cefTRIAXone (ROCEPHIN)  IV 200 mL/hr at 10/22/22 1100   feeding supplement (OSMOLITE 1.2 CAL) 55 mL/hr at 10/22/22 1100    PRN Meds: sodium chloride, acetaminophen **OR** acetaminophen, fentaNYL (SUBLIMAZE) injection, guaiFENesin, hydrALAZINE, labetalol, mouth rinse, sodium chloride flush, traMADol, white petrolatum  Physical Exam Vitals reviewed.  Constitutional:      General: She is sleeping.     Appearance: She is ill-appearing.     Comments: Cortrak in place  HENT:     Head: Normocephalic and atraumatic.  Cardiovascular:     Rate and Rhythm: Tachycardia present.  Pulmonary:     Effort: Pulmonary effort is normal. Tachypnea present.  Skin:    General: Skin is warm and dry.             Vital Signs: BP (!) 183/99   Pulse (!) 110   Temp 99.6 F (37.6 C) (Oral)   Resp (!) 21   Ht 5\' 3"  (1.6 m)   Wt 50.8 kg   SpO2 99%   BMI 19.84 kg/m  SpO2: SpO2: 99 % O2 Device: O2 Device: Room  Air O2 Flow Rate: O2 Flow Rate (L/min): 2 L/min    Patient Active Problem List   Diagnosis Date Noted   Sepsis due to Escherichia coli with encephalopathy without septic shock (HCC) 10/17/2022   Acute metabolic encephalopathy 10/17/2022   Severe sepsis (HCC) 10/14/2022   Hypovolemia 10/14/2022   Dehydration 10/14/2022  AKI (acute kidney injury) (HCC) 10/14/2022   CAP (community acquired pneumonia) 06/30/2022   Colon cancer screening 08/10/2020   Acute liver failure 06/08/2020   Acute GI bleeding 09/08/2019   Polysubstance abuse (HCC) 09/08/2019   Gastroesophageal reflux disease with esophagitis    Seizure (HCC) 05/30/2015   Cough    Subtherapeutic phenytoin level    Acute upper GI bleed    Hypomagnesemia 04/16/2014   Hypokalemia 04/16/2014   Thrombocytopenia (HCC) 04/16/2014   Liver disease 04/15/2014   ETOH abuse 04/15/2014   Chronic pancreatitis (HCC) 04/15/2014   Protein-calorie malnutrition, severe (HCC) 04/15/2014   Severe protein-calorie malnutrition (HCC) 04/15/2014   Bleeding gastrointestinal    Abnormal CT scan, colon    Rectal bleeding    Abdominal pain    Alcoholic hepatitis without ascites    GI bleed 04/14/2014   Viral URI with cough 05/07/2012   Pyelonephritis 05/07/2012   Seizure disorder (HCC) 05/07/2012   Asthma with bronchitis 05/07/2012   HTN (hypertension) 05/07/2012    Palliative Care Assessment & Plan   Patient Profile: 62 y.o. female  with past medical history of ETOH use, asthma, chronic back pain, colitis, CIB, pancreatitis, and seizures admitted on 10/14/2022 with poor appetite and weakness. Patient found to have E coli bacteremia; STEC gastroenteritis. Severe AKI, no need for HD at this point. Has received some benzos during hospitalizations for withdrawal symptoms. PMT consulted for GOC discussions.    Assessment: Patient was sleeping in NAD. Family asked for another meeting with PMT. Three family members present including brother Renae Gloss  who is main contact person for the family.  Gave family updates including continued acute renal failure, increased WBC count, no growth of cultures 9/15, continued altered mental status including agitation last night.   The family understands the patient is still very acutely ill. We discussed taking things one day at a time. If her kidney function were to improve she would have a long road ahead. We discussed what a meaningful recovery would look like for her. She has always been very independent and does not like many medical interventions like doctor's appointments.   They continue to hope and pray for her recovery and want time for further outcomes. PMT will continue to support. Family encouraged to call with questions or concerns.  Recommendations/Plan: DNR Do not intubate, do not start vasopressors, do not initiate dialysis Treat the treatable Time for outcomes Patient's brother Thersa Salt [phone] will remain the point of contact for the family. Spiritual care consult Continued PMT support   Code Status:    Code Status Orders  (From admission, onward)           Start     Ordered   10/19/22 1308  Do not attempt resuscitation (DNR)- Limited -Do Not Intubate (DNI)  (Code Status)  Continuous       Question Answer Comment  If pulseless and not breathing No CPR or chest compressions.   In Pre-Arrest Conditions (Patient Is Breathing and Has A Pulse) Do not intubate. Provide all appropriate non-invasive medical interventions. Avoid ICU transfer unless indicated or required.   Consent: Discussion documented in EHR or advanced directives reviewed      10/19/22 1307             Care plan was discussed with bedside RN   Time spent: 50 minutes  Thank you for allowing the Palliative Medicine Team to assist in the care of this patient.     Sherryll Burger, NP  Please contact Palliative Medicine Team phone at [phone] for questions and concerns.

## 2022-10-23 DIAGNOSIS — G9341 Metabolic encephalopathy: Secondary | ICD-10-CM | POA: Diagnosis not present

## 2022-10-23 DIAGNOSIS — E861 Hypovolemia: Secondary | ICD-10-CM

## 2022-10-23 DIAGNOSIS — A419 Sepsis, unspecified organism: Secondary | ICD-10-CM | POA: Diagnosis not present

## 2022-10-23 DIAGNOSIS — N179 Acute kidney failure, unspecified: Secondary | ICD-10-CM | POA: Diagnosis not present

## 2022-10-23 LAB — GLUCOSE, CAPILLARY
Glucose-Capillary: 100 mg/dL — ABNORMAL HIGH (ref 70–99)
Glucose-Capillary: 116 mg/dL — ABNORMAL HIGH (ref 70–99)
Glucose-Capillary: 85 mg/dL (ref 70–99)
Glucose-Capillary: 93 mg/dL (ref 70–99)
Glucose-Capillary: 99 mg/dL (ref 70–99)

## 2022-10-23 MED ORDER — SODIUM CHLORIDE 0.9 % IV SOLN: INTRAVENOUS | Status: AC

## 2022-10-23 NOTE — Plan of Care (Signed)
Problem: Clinical Measurements: Goal: Diagnostic test results will improve Outcome: Progressing Goal: Respiratory complications will improve Outcome: Progressing   Problem: Nutrition: Goal: Adequate nutrition will be maintained Outcome: Progressing   Problem: Pain Managment: Goal: General experience of comfort will improve Outcome: Progressing

## 2022-10-23 NOTE — Progress Notes (Signed)
Patient ID: Brandy Bishop, female   DOB: 09-06-1960, 62 y.o.   MRN: [id number]    Progress Note from the Palliative Medicine Team at [facility]   Patient Name: Brandy Bishop        Date: 10/23/2022 DOB: Jan 18, 1961  Age: 62 y.o. MRN#: [id number] Attending Physician: Maretta Bees, MD Primary Care Physician: Cline Crock, NP Admit Date: 10/14/2022   Reason for Consultation/Follow-up   Establishing goals of care   HPI/ Brief Hospital Review  62 y.o. female  with past medical history of ETOH use, asthma, chronic back pain, colitis, CIB, pancreatitis, and seizures admitted on 10/14/2022 with poor appetite and weakness. Patient found to have E coli bacteremia; STEC gastroenteritis. Severe AKI, no need for HD at this point. Has received some benzos during hospitalizations for withdrawal symptoms. PMT consulted for GOC discussions.      Subjective  Extensive chart review has been completed prior to meeting with patient/family  including labs, vital signs, imaging, progress/consult notes, orders, medications and available advance directive documents.    This NP assessed patient at the bedside as a follow up for palliative medicine needs and his emotional support.  Patient appears generally uncomfortable, remains with mitts, core Trak.  Discussed with nursing.  Called to brother and spoke to him by telephone.  Education offered on seriousness of patient's current medical situation.  Education offered on the difference between a full medical support path and a palliative comfort path.  Education offered on hospice benefit; philosophy and eligibility     Education offered today regarding  the importance of continued conversation with family and their  medical providers regarding overall plan of care and treatment options,  ensuring decisions are within the context of the patients values and GOCs.  Encouraged in person family meeting, brother will follow-up with Sarina Ser NP on Friday at 10;30 in the room for on going GOC discussion   Questions and concerns addressed   Discussed with primary team and nursing staff   Time: 50    minutes  Detailed review of medical records ( labs, imaging, vital signs), medically appropriate exam ( MS, skin, cardia,  resp)   discussed with treatment team, counseling and education to patient, family, staff, documenting clinical information, medication management, coordination of care    Lorinda Creed NP  Palliative Medicine Team Team Phone # [phone] Pager [phone]

## 2022-10-23 NOTE — Progress Notes (Signed)
Speech Language Pathology Treatment: Dysphagia  Patient Details Name: AVEEN FLOCK MRN: [id number] DOB: 1960/12/19 Today's Date: 10/23/2022 Time: 4098-1191 SLP Time Calculation (min) (ACUTE ONLY): 20 min  Assessment / Plan / Recommendation Clinical Impression  Patient seen by SLP for skilled treatment focused on dysphagia goals. When SLP arrived into room, patient was awake, alert, lying diagonally in bed c/o arm hurting from being "tied down". (She is in wrist restraints as well as restraint mitts.) Her oral mucosa and lips appeared very dry with some dried secretions on lips and teeth. This improved after oral care provided. SLP attempted to reposition patient in bed, but she would c/o pain with even slight movement. SLP was able to elevate HOB enough for patient to be able to safely consume liquid PO's. She drank thin liquids (water) via straw sips with no observed coughing or throat clearing and with appearance of more timely and stronger pharyngeal swallow. SLP is recommending to restart previously recommended PO diet of full liquids(thin consistency)  and will continue to follow patient.    HPI HPI: Pt is a 62 y.o. female who presented with generalized aches and malaise x3-4 days. She noted nausea, emesis, and diarreha, but her significant other denied noting these symptoms. Increasing confusion noted from family. PMH is significant for etoh abuse, panreatitis, seizures, hypertension, and GIB.      SLP Plan  Continue with current plan of care      Recommendations for follow up therapy are one component of a multi-disciplinary discharge planning process, led by the attending physician.  Recommendations may be updated based on patient status, additional functional criteria and insurance authorization.    Recommendations  Diet recommendations: Thin liquid;Other(comment) (full liquids) Liquids provided via: Straw;Cup Medication Administration: Crushed with puree Supervision: Full  supervision/cueing for compensatory strategies;Staff to assist with self feeding Compensations: Slow rate;Small sips/bites Postural Changes and/or Swallow Maneuvers: Seated upright 90 degrees                  Oral care BID   Frequent or constant Supervision/Assistance Dysphagia, oropharyngeal phase (R13.12)     Continue with current plan of care     Angela Nevin, MA, CCC-SLP Speech Therapy

## 2022-10-23 NOTE — Progress Notes (Signed)
PROGRESS NOTE        PATIENT DETAILS Name: Brandy Bishop Age: 62 y.o. Sex: female Date of Birth: 1960/03/06 Admit Date: 10/14/2022 Admitting Physician A Grier Mitts., MD ZOX:[id number], Maxine Glenn, NP  Brief Summary: Patient is a 62 y.o.  female with history of EtOH use, HTN, asthma-who presented with confusion/body aches/diarrhea-she was initially thought to have sepsis physiology due to pyelonephritis-however her stool studies was positive for Shiga like toxin E. coli-subsequent blood cultures came back positive for E. coli as well.   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  Unfortunately-she continued to deteriorate-after discussion with palliative care she was made a DNR.  She was not considered a dialysis candidate.  See below for further details.  Significant events: 9/9>> admission to [facility] 9/11>> CCM consult for increased work of breathing/pulm edema. 9/12>>stool test positive for STEC, STEC-HUS being considered 9/14>>: Oncology consulted, given 1 dose of eculizumab, made DNR after discussion with palliative care 9/17: Transferred to the hospitalist service  Significant studies: 9/9>> CXR: No active disease 9/9>> RUQ ultrasound: No bowel stones. 9/9>> CT abdomen: No acute intracranial abnormality 9/9>> CT abdomen/pelvis: Bile lateral perinephric stranding, moderate air/fluid distention of the colon/rectum. 9/11>> CXR: Interval development of pulm edema 9/16>> bilateral upper extremity Doppler: No DVT  Significant microbiology data: 9/09>> COVID/influenza PCR: Negative 9/09>> blood culture: E. Coli 9/10>> GI pathogen panel: Shiga like toxin producing E. coli. 9/10>> C. difficile: Negative 9/15>> blood culture: No growth  Procedures: 9/13>>Cortrak tube  Consults: PCCM Nephrology Palliative care Oncology  Subjective: Lying comfortably in  bed-denies any chest pain or shortness of breath.  Objective: Vitals: Blood pressure 132/82, pulse (!) 199, temperature 98.4 F (36.9 C), temperature source Oral, resp. rate (!) 21, height 5\' 3"  (1.6 m), weight 50.8 kg, SpO2 92%.   Exam: Gen Exam:Alert awake-not in any distress HEENT:atraumatic, normocephalic Chest: B/L clear to auscultation anteriorly CVS:S1S2 regular Abdomen:soft non tender, non distended Extremities:no edema Neurology: Non focal Skin: no rash  Pertinent Labs/Radiology:    Latest Ref Rng & Units 10/23/2022    4:00 AM 10/22/2022    3:04 AM 10/21/2022    3:26 AM  CBC  WBC 4.0 - 10.5 K/uL 25.5  26.8  26.7   Hemoglobin 12.0 - 15.0 g/dL 7.5  9.8  8.5   Hematocrit 36.0 - 46.0 % 21.9  28.4  24.1   Platelets 150 - 400 K/uL 30  24  20      Lab Results  Component Value Date   NA 145 10/23/2022   K 4.9 10/23/2022   CL 106 10/23/2022   CO2 27 10/23/2022      Assessment/Plan: Severe sepsis due to E. coli bacteremia in the setting of gastroenteritis due to Shigella producing E. 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 Improving  Follow CBC  Acute metabolic encephalopathy Multifactorial-from sepsis/AKI/alcohol withdrawal (should be out of window by now) Remains significantly confused In restraints-but seems to be moving all 4 extremities Supportive care Delirium  precautions  Oropharyngeal dysphagia In the setting of encephalopathy/debility/deconditioning NG tube feedings ongoing Goals of care discussions ongoing  Normocytic anemia Secondary to acute illness/AKI No evidence of GI bleeding Follow closely.  EtOH use Should be out of the window for any withdrawal symptoms No longer on benzos Supportive care-MVI/thiamine/folate  HTN BP stable All antihypertensives on hold  RUE  edema/pain Imaging/Dopplers negative Supportive care  Palliative care/goals of care DNR No signs of renal recovery-not a HD candidate remains severely encephalopathic Poor overall prognosis Palliative care following and engaging with family-not unreasonable to consider hospice care at this time. Will await further goals of care discussion-but per last palliative care note-no further escalation in care.  Nutrition Status: Nutrition Problem: Severe Malnutrition Etiology: chronic illness (chronic pancreatitis, liver disease) Signs/Symptoms: severe fat depletion, severe muscle depletion Interventions: Tube feeding, MVI, Refer to RD note for recommendations  Underweight: Estimated body mass index is 19.84 kg/m as calculated from the following:   Height as of this encounter: 5\' 3"  (1.6 m).   Weight as of this encounter: 50.8 kg.   Code status:   Code Status: Limited: Do not attempt resuscitation (DNR) -DNR-LIMITED -Do Not Intubate/DNI    DVT Prophylaxis: SCDs Start: 10/14/22 2019   Family Communication: None at bedside   Disposition Plan: Status is: Inpatient Remains inpatient appropriate because: Severity of illness   Planned Discharge Destination:Skilled nursing facility versus  hospice   Diet: Diet Order             Diet NPO time specified  Diet effective now                     Antimicrobial agents: Anti-infectives (From admission, onward)    Start     Dose/Rate Route Frequency Ordered Stop   10/15/22 1000  cefTRIAXone (ROCEPHIN)  1 g in sodium chloride 0.9 % 100 mL IVPB  Status:  Discontinued        1 g 200 mL/hr over 30 Minutes Intravenous Every 24 hours 10/14/22 1746 10/15/22 0018   10/15/22 1000  cefTRIAXone (ROCEPHIN) 2 g in sodium chloride 0.9 % 100 mL IVPB        2 g 200 mL/hr over 30 Minutes Intravenous Every 24 hours 10/15/22 0018     10/14/22 1000  ceFEPIme (MAXIPIME) 2 g in sodium chloride 0.9 % 100 mL IVPB        2 g 200 mL/hr over 30 Minutes Intravenous  Once 10/14/22 0946 10/14/22 1541   10/14/22 0945  aztreonam (AZACTAM) 2 g in sodium chloride 0.9 % 100 mL IVPB  Status:  Discontinued        2 g 200 mL/hr over 30 Minutes Intravenous  Once 10/14/22 0943 10/14/22 0946   10/14/22 0945  metroNIDAZOLE (FLAGYL) IVPB 500 mg        500 mg 100 mL/hr over 60 Minutes Intravenous  Once 10/14/22 0943 10/14/22 1309   10/14/22 0945  vancomycin (VANCOCIN) IVPB 1000 mg/200 mL premix        1,000 mg 200 mL/hr over 60 Minutes Intravenous  Once 10/14/22 0943 10/14/22 1643        MEDICATIONS: Scheduled Meds:  Chlorhexidine Gluconate Cloth  6 each Topical Q0600   folic acid  1 mg Per Tube Daily   mirtazapine  7.5 mg Per  Tube QHS   multivitamin with minerals  1 tablet Per Tube Daily   mouth rinse  15 mL Mouth Rinse 4 times per day   pantoprazole (PROTONIX) IV  40 mg Intravenous Daily   sodium chloride flush  10-40 mL Intracatheter Q12H   thiamine  100 mg Per Tube Daily   Continuous Infusions:  sodium chloride 10 mL/hr at 10/23/22 0800   cefTRIAXone (ROCEPHIN)  IV 2 g (10/23/22 1006)   feeding supplement (OSMOLITE 1.2 CAL) 55 mL/hr at 10/23/22 0800   PRN Meds:.sodium chloride, acetaminophen **OR** acetaminophen, fentaNYL (SUBLIMAZE) injection, guaiFENesin, hydrALAZINE, labetalol, mouth rinse, sodium chloride flush, traMADol, white petrolatum   I have personally reviewed following labs and imaging studies  LABORATORY DATA: CBC: Recent Labs  Lab 10/19/22 0211 10/19/22 0445 10/20/22 0640 10/20/22 1735  10/21/22 0326 10/22/22 0304 10/23/22 0400  WBC 17.9*  --  22.4*  --  26.7* 26.8* 25.5*  NEUTROABS 15.4*  --  18.2*  --  21.8* 21.9* 20.6*  HGB 8.6*   < > 6.9* 8.2* 8.5* 9.8* 7.5*  HCT 23.8*   < > 19.2* 23.0* 24.1* 28.4* 21.9*  MCV 88.1  --  88.9  --  87.0 87.9 92.8  PLT 30*  --  19*  --  20* 24* 30*   < > = values in this interval not displayed.    Basic Metabolic Panel: Recent Labs  Lab 10/19/22 0211 10/19/22 1739 10/20/22 0640 10/20/22 1735 10/21/22 0326 10/22/22 0304 10/23/22 0400  NA 141  --  143  --  142 143 145  K 3.5  --  2.9*  --  4.7 4.4 4.9  CL 98  --  104  --  106 103 106  CO2 27  --  28  --  26 28 27   GLUCOSE 121*  --  119*  --  118* 112* 110*  BUN 72*  --  75*  --  77* 84* 90*  CREATININE 3.61*  --  3.65*  --  3.62* 3.70* 3.78*  CALCIUM 8.4*  --  8.2*  --  8.4* 8.7* 8.5*  MG 1.7 1.6* 1.7 1.6* 1.6* 2.7* 2.4  PHOS 3.5 2.8 2.8 2.5  --   --  3.2    GFR: Estimated Creatinine Clearance: 12.5 mL/min (A) (by C-G formula based on SCr of 3.78 mg/dL (H)).  Liver Function Tests: Recent Labs  Lab 10/19/22 0211 10/20/22 0640 10/21/22 0326 10/22/22 0304 10/23/22 0400  AST 65* 33 46* 41 40  ALT 27 20 21 20 20   ALKPHOS 147* 123 180* 188* 163*  BILITOT 6.2* 4.9* 6.5* 6.7* 5.5*  PROT 5.5* 5.1* 5.3* 6.0* 5.5*  ALBUMIN 2.3* 1.8* 1.8* 1.9* 1.6*   No results for input(s): "LIPASE", "AMYLASE" in the last 168 hours. No results for input(s): "AMMONIA" in the last 168 hours.  Coagulation Profile: Recent Labs  Lab 10/17/22 2100  INR 1.4*    Cardiac Enzymes: No results for input(s): "CKTOTAL", "CKMB", "CKMBINDEX", "TROPONINI" in the last 168 hours.  BNP (last 3 results) No results for input(s): "PROBNP" in the last 8760 hours.  Lipid Profile: No results for input(s): "CHOL", "HDL", "LDLCALC", "TRIG", "CHOLHDL", "LDLDIRECT" in the last 72 hours.  Thyroid Function Tests: No results for input(s): "TSH", "T4TOTAL", "FREET4", "T3FREE", "THYROIDAB" in the last 72  hours.  Anemia Panel: No results for input(s): "VITAMINB12", "FOLATE", "FERRITIN", "TIBC", "IRON", "RETICCTPCT" in the last 72 hours.  Urine analysis:    Component Value Date/Time   COLORURINE AMBER (A) 10/14/2022  1600   APPEARANCEUR TURBID (A) 10/14/2022 1600   LABSPEC 1.017 10/14/2022 1600   PHURINE 5.0 10/14/2022 1600   GLUCOSEU NEGATIVE 10/14/2022 1600   HGBUR SMALL (A) 10/14/2022 1600   BILIRUBINUR SMALL (A) 10/14/2022 1600   KETONESUR NEGATIVE 10/14/2022 1600   PROTEINUR 100 (A) 10/14/2022 1600   UROBILINOGEN 0.2 04/14/2014 1419   NITRITE NEGATIVE 10/14/2022 1600   LEUKOCYTESUR MODERATE (A) 10/14/2022 1600    Sepsis Labs: Lactic Acid, Venous    Component Value Date/Time   LATICACIDVEN 1.2 10/17/2022 2143    MICROBIOLOGY: Recent Results (from the past 240 hour(s))  Blood Culture (routine x 2)     Status: Abnormal   Collection Time: 10/14/22  9:40 AM   Specimen: BLOOD  Result Value Ref Range Status   Specimen Description BLOOD LEFT ANTECUBITAL  Final   Special Requests   Final    BOTTLES DRAWN AEROBIC AND ANAEROBIC Blood Culture results may not be optimal due to an excessive volume of blood received in culture bottles   Culture  Setup Time   Final    GRAM NEGATIVE RODS IN BOTH AEROBIC AND ANAEROBIC BOTTLES CRITICAL RESULT CALLED TO, READ BACK BY AND VERIFIED WITH: PHARMD G. ABBOTT 10/15/22 @ 0011 BY AB Performed at [facility], [address] [place], [place] [address]    Culture ESCHERICHIA COLI (A)  Final   Report Status 10/17/2022 FINAL  Final   Organism ID, Bacteria ESCHERICHIA COLI  Final      Susceptibility   Escherichia coli - MIC*    AMPICILLIN <=2 SENSITIVE Sensitive     CEFEPIME <=0.12 SENSITIVE Sensitive     CEFTAZIDIME <=1 SENSITIVE Sensitive     CEFTRIAXONE <=0.25 SENSITIVE Sensitive     CIPROFLOXACIN <=0.25 SENSITIVE Sensitive     GENTAMICIN <=1 SENSITIVE Sensitive     IMIPENEM <=0.25 SENSITIVE Sensitive     TRIMETH/SULFA <=20  SENSITIVE Sensitive     AMPICILLIN/SULBACTAM <=2 SENSITIVE Sensitive     PIP/TAZO <=4 SENSITIVE Sensitive     * ESCHERICHIA COLI  Blood Culture ID Panel (Reflexed)     Status: Abnormal   Collection Time: 10/14/22  9:40 AM  Result Value Ref Range Status   Enterococcus faecalis NOT DETECTED NOT DETECTED Final   Enterococcus Faecium NOT DETECTED NOT DETECTED Final   Listeria monocytogenes NOT DETECTED NOT DETECTED Final   Staphylococcus species NOT DETECTED NOT DETECTED Final   Staphylococcus aureus (BCID) NOT DETECTED NOT DETECTED Final   Staphylococcus epidermidis NOT DETECTED NOT DETECTED Final   Staphylococcus lugdunensis NOT DETECTED NOT DETECTED Final   Streptococcus species NOT DETECTED NOT DETECTED Final   Streptococcus agalactiae NOT DETECTED NOT DETECTED Final   Streptococcus pneumoniae NOT DETECTED NOT DETECTED Final   Streptococcus pyogenes NOT DETECTED NOT DETECTED Final   A.calcoaceticus-baumannii NOT DETECTED NOT DETECTED Final   Bacteroides fragilis NOT DETECTED NOT DETECTED Final   Enterobacterales DETECTED (A) NOT DETECTED Final    Comment: Enterobacterales represent a large order of gram negative bacteria, not a single organism. CRITICAL RESULT CALLED TO, READ BACK BY AND VERIFIED WITH: PHARMD G. ABBOTT 10/15/22 @ 0011 BY AB    Enterobacter cloacae complex NOT DETECTED NOT DETECTED Final   Escherichia coli DETECTED (A) NOT DETECTED Final    Comment: CRITICAL RESULT CALLED TO, READ BACK BY AND VERIFIED WITH: PHARMD G. ABBOTT 10/15/22 @ 0031 BY AB    Klebsiella aerogenes NOT DETECTED NOT DETECTED Final   Klebsiella oxytoca NOT DETECTED NOT DETECTED Final  Klebsiella pneumoniae NOT DETECTED NOT DETECTED Final   Proteus species NOT DETECTED NOT DETECTED Final   Salmonella species NOT DETECTED NOT DETECTED Final   Serratia marcescens NOT DETECTED NOT DETECTED Final   Haemophilus influenzae NOT DETECTED NOT DETECTED Final   Neisseria meningitidis NOT DETECTED NOT  DETECTED Final   Pseudomonas aeruginosa NOT DETECTED NOT DETECTED Final   Stenotrophomonas maltophilia NOT DETECTED NOT DETECTED Final   Candida albicans NOT DETECTED NOT DETECTED Final   Candida auris NOT DETECTED NOT DETECTED Final   Candida glabrata NOT DETECTED NOT DETECTED Final   Candida krusei NOT DETECTED NOT DETECTED Final   Candida parapsilosis NOT DETECTED NOT DETECTED Final   Candida tropicalis NOT DETECTED NOT DETECTED Final   Cryptococcus neoformans/gattii NOT DETECTED NOT DETECTED Final   CTX-M ESBL NOT DETECTED NOT DETECTED Final   Carbapenem resistance IMP NOT DETECTED NOT DETECTED Final   Carbapenem resistance KPC NOT DETECTED NOT DETECTED Final   Carbapenem resistance NDM NOT DETECTED NOT DETECTED Final   Carbapenem resist OXA 48 LIKE NOT DETECTED NOT DETECTED Final   Carbapenem resistance VIM NOT DETECTED NOT DETECTED Final    Comment: Performed at [facility], [address] [place], [place] [address]  SARS Coronavirus 2 by RT PCR (hospital order, performed in [facility] hospital lab) *cepheid single result test* Anterior Nasal Swab     Status: None   Collection Time: 10/14/22  5:26 PM   Specimen: Anterior Nasal Swab  Result Value Ref Range Status   SARS Coronavirus 2 by RT PCR NEGATIVE NEGATIVE Final    Comment: Performed at [facility], [address] [place], [place] [address]  Resp Panel by RT-PCR (Flu A&B, Covid)     Status: None   Collection Time: 10/14/22  5:26 PM  Result Value Ref Range Status   SARS Coronavirus 2 by RT PCR NEGATIVE NEGATIVE Final   Influenza A by PCR NEGATIVE NEGATIVE Final   Influenza B by PCR NEGATIVE NEGATIVE Final    Comment: (NOTE) The Xpert Xpress SARS-CoV-2/FLU/RSV plus assay is intended as an aid in the diagnosis of influenza from Nasopharyngeal swab specimens and should not be used as a sole basis for treatment. Nasal washings and aspirates are unacceptable for Xpert Xpress SARS-CoV-2/FLU/RSV testing.  Fact  Sheet for Patients: BloggerCourse.com  Fact Sheet for Healthcare Providers: SeriousBroker.it  This test is not yet approved or cleared by the [place] FDA and has been authorized for detection and/or diagnosis of SARS-CoV-2 by FDA under an Emergency Use Authorization (EUA). This EUA will remain in effect (meaning this test can be used) for the duration of the COVID-19 declaration under Section 564(b)(1) of the Act, 21 U.S.C. section 360bbb-3(b)(1), unless the authorization is terminated or revoked.  Performed at [facility], [address] [place], [place] [address]   MRSA Next Gen by PCR, Nasal     Status: None   Collection Time: 10/14/22  8:27 PM   Specimen: Nasal Mucosa; Nasal Swab  Result Value Ref Range Status   MRSA by PCR Next Gen NOT DETECTED NOT DETECTED Final    Comment: (NOTE) The GeneXpert MRSA Assay (FDA approved for NASAL specimens only), is one component of a comprehensive MRSA colonization surveillance program. It is not intended to diagnose MRSA infection nor to guide or monitor treatment for MRSA infections. Test performance is not FDA approved in patients less than 60 years old. Performed at [facility], [address]  [place], [place] [address]   Blood Culture (routine x 2)     Status: None   Collection Time: 10/14/22  9:34 PM   Specimen: BLOOD LEFT HAND  Result Value Ref Range Status   Specimen Description BLOOD LEFT HAND  Final   Special Requests   Final    BOTTLES DRAWN AEROBIC AND ANAEROBIC Blood Culture adequate volume   Culture   Final    NO GROWTH 5 DAYS Performed at [facility], [address], [place], [place] [address]    Report Status 10/19/2022 FINAL  Final  C Difficile Quick Screen w PCR reflex     Status: None   Collection Time: 10/15/22  6:09 PM   Specimen: STOOL  Result Value Ref Range Status   C Diff antigen NEGATIVE NEGATIVE Final   C Diff toxin NEGATIVE  NEGATIVE Final   C Diff interpretation No C. difficile detected.  Final    Comment: Performed at [facility], [address], [place], [place] [address]  Gastrointestinal Panel by PCR , Stool     Status: Abnormal   Collection Time: 10/15/22  6:09 PM   Specimen: Stool  Result Value Ref Range Status   Campylobacter species NOT DETECTED NOT DETECTED Final   Plesimonas shigelloides NOT DETECTED NOT DETECTED Final   Salmonella species NOT DETECTED NOT DETECTED Final   Yersinia enterocolitica NOT DETECTED NOT DETECTED Final   Vibrio species NOT DETECTED NOT DETECTED Final   Vibrio cholerae NOT DETECTED NOT DETECTED Final   Enteroaggregative E coli (EAEC) NOT DETECTED NOT DETECTED Final   Enterotoxigenic E coli (ETEC) NOT DETECTED NOT DETECTED Final   Shiga like toxin producing E coli (STEC) DETECTED (A) NOT DETECTED Final    Comment: RESULT CALLED TO, READ BACK BY AND VERIFIED WITH: ROSELOE ZARSONA 2212 10/16/22 MU    E. coli O157 NOT DETECTED NOT DETECTED Final   Shigella/Enteroinvasive E coli (EIEC) NOT DETECTED NOT DETECTED Final   Cryptosporidium NOT DETECTED NOT DETECTED Final   Cyclospora cayetanensis NOT DETECTED NOT DETECTED Final   Entamoeba histolytica NOT DETECTED NOT DETECTED Final   Giardia lamblia NOT DETECTED NOT DETECTED Final   Adenovirus F40/41 NOT DETECTED NOT DETECTED Final   Astrovirus NOT DETECTED NOT DETECTED Final   Norovirus GI/GII NOT DETECTED NOT DETECTED Final   Rotavirus A NOT DETECTED NOT DETECTED Final   Sapovirus (I, II, IV, and V) NOT DETECTED NOT DETECTED Final    Comment: Performed at [facility], [address] [place], [place] [address]  MRSA Next Gen by PCR, Nasal     Status: None   Collection Time: 10/17/22  9:30 PM   Specimen: Nasal Mucosa; Nasal Swab  Result Value Ref Range Status   MRSA by PCR Next Gen NOT DETECTED NOT DETECTED Final    Comment: (NOTE) The GeneXpert MRSA Assay (FDA approved for NASAL specimens only), is one  component of a comprehensive MRSA colonization surveillance program. It is not intended to diagnose MRSA infection nor to guide or monitor treatment for MRSA infections. Test performance is not FDA approved in patients less than 88 years old. Performed at [facility], [address] [place], [place] [address]   Culture, blood (Routine X 2) w Reflex to ID Panel     Status: None (Preliminary result)   Collection Time: 10/20/22  2:36 PM   Specimen: BLOOD LEFT HAND  Result Value Ref Range Status   Specimen Description BLOOD LEFT HAND  Final  Special Requests   Final    BOTTLES DRAWN AEROBIC AND ANAEROBIC Blood Culture results may not be optimal due to an inadequate volume of blood received in culture bottles   Culture   Final    NO GROWTH 2 DAYS Performed at [facility], [address] [place], [place] [address]    Report Status PENDING  Incomplete  Culture, blood (Routine X 2) w Reflex to ID Panel     Status: None (Preliminary result)   Collection Time: 10/20/22  2:36 PM   Specimen: BLOOD LEFT HAND  Result Value Ref Range Status   Specimen Description BLOOD LEFT HAND  Final   Special Requests   Final    BOTTLES DRAWN AEROBIC AND ANAEROBIC Blood Culture results may not be optimal due to an inadequate volume of blood received in culture bottles   Culture   Final    NO GROWTH 2 DAYS Performed at [facility], [address] [place], [place] [address]    Report Status PENDING  Incomplete    RADIOLOGY STUDIES/RESULTS: DG Wrist 2 Views Right  Result Date: 10/22/2022 CLINICAL DATA:  Right wrist pain. EXAM: RIGHT WRIST - 2 VIEW COMPARISON:  None Available. FINDINGS: There is no evidence of fracture or dislocation. There is no evidence of arthropathy or other focal bone abnormality. There are vascular calcifications. Soft tissues are unremarkable. IMPRESSION: Vascular calcifications. Otherwise negative radiographs of the right wrist. Electronically Signed   By: Narda Rutherford M.D.   On: 10/22/2022 13:00   DG Elbow 2 Views Right  Result Date: 10/22/2022 CLINICAL DATA:  Right elbow pain. EXAM: RIGHT ELBOW - 2 VIEW COMPARISON:  Radiograph 02/05/2022 FINDINGS: Medial and lateral plate and multi screw fixation of the distal humerus. No periprosthetic lucency. Proximal most screws through the medial plate extends into the soft tissues by 5 mm as before. There is no acute fracture. The alignment is normal. No definite elbow joint effusion. No erosive change. IMPRESSION: 1. No acute findings. 2. Stable postsurgical change. Proximal-most fixating screw extends into the soft tissues by 5 mm, without significant interval change Electronically Signed   By: Narda Rutherford M.D.   On: 10/22/2022 12:59   DG Shoulder Right  Result Date: 10/22/2022 CLINICAL DATA:  Right shoulder pain. EXAM: RIGHT SHOULDER - 2+ VIEW COMPARISON:  Radiograph 02/05/2022 FINDINGS: There is no evidence of fracture or dislocation. The acromioclavicular joint is congruent. Chronic glenohumeral arthropathy with joint space narrowing, subchondral sclerosis and cystic change and osteophytes. Possible intra-articular body projecting over the proximal humerus, unchanged. No erosive changes. No acute findings. Soft tissues are unremarkable. IMPRESSION: Chronic glenohumeral arthropathy. Possible intra-articular body. No acute findings. Electronically Signed   By: Narda Rutherford M.D.   On: 10/22/2022 12:57   VAS [place] UPPER EXTREMITY VENOUS DUPLEX  Result Date: 10/21/2022 UPPER VENOUS STUDY  Patient Name:  Brandy Bishop  Date of Exam:   10/21/2022 Medical Rec #: [id number]           Accession #:    [id number] Date of Birth: 1960-12-25           Patient Gender: F Patient Age:   43 years Exam Location:  [facility] Procedure:      VAS [place] UPPER EXTREMITY VENOUS DUPLEX Referring Phys: Emilie Rutter --------------------------------------------------------------------------------  Indications: Swelling Risk  Factors: Immobility. Limitations: Limited range of motion, movement, mid-exam refusal. Comparison Study: No prior study Performing Technologist: Shona Simpson  Examination Guidelines: A complete evaluation includes  B-mode imaging, spectral Doppler, color Doppler, and power Doppler as needed of all accessible portions of each vessel. Bilateral testing is considered an integral part of a complete examination. Limited examinations for reoccurring indications may be performed as noted.  Right Findings: +----------+------------+---------+-----------+----------+--------------+ RIGHT     CompressiblePhasicitySpontaneousProperties   Summary     +----------+------------+---------+-----------+----------+--------------+ IJV                                                 Not visualized +----------+------------+---------+-----------+----------+--------------+ Subclavian    Full       Yes       Yes                             +----------+------------+---------+-----------+----------+--------------+ Axillary      Full       Yes       Yes                             +----------+------------+---------+-----------+----------+--------------+ Brachial      Full       Yes       Yes                             +----------+------------+---------+-----------+----------+--------------+ Radial                                              Not visualized +----------+------------+---------+-----------+----------+--------------+ Ulnar                                               Not visualized +----------+------------+---------+-----------+----------+--------------+ Cephalic                                            Not visualized +----------+------------+---------+-----------+----------+--------------+ Basilic                                             Not visualized +----------+------------+---------+-----------+----------+--------------+  Left Findings:  +----------+------------+---------+-----------+----------+--------------+ LEFT      CompressiblePhasicitySpontaneousProperties   Summary     +----------+------------+---------+-----------+----------+--------------+ IJV           Full       Yes       Yes                             +----------+------------+---------+-----------+----------+--------------+ Subclavian    Full       Yes       Yes                             +----------+------------+---------+-----------+----------+--------------+ Axillary      Full       Yes       Yes                             +----------+------------+---------+-----------+----------+--------------+  Brachial                                            Not visualized +----------+------------+---------+-----------+----------+--------------+ Radial                                              Not visualized +----------+------------+---------+-----------+----------+--------------+ Ulnar                                               Not visualized +----------+------------+---------+-----------+----------+--------------+ Cephalic                                            Not visualized +----------+------------+---------+-----------+----------+--------------+ Basilic                                             Not visualized +----------+------------+---------+-----------+----------+--------------+  Summary:  Right: No evidence of deep vein thrombosis in the upper extremity. Incomplete examination of arm.  Left: No evidence of deep vein thrombosis in the upper extremity. Incomplete examination of arm.  *See table(s) above for measurements and observations.  Diagnosing physician: Lemar Livings MD Electronically signed by Lemar Livings MD on 10/21/2022 at 4:46:01 PM.    Final      LOS: 9 days   Jeoffrey Massed, MD  [facility]    To contact the attending provider between 7A-7P or the covering provider during after hours  7P-7A, please log into the web site [email or web address] and access using universal [place] password for that web site. If you do not have the password, please call the hospital operator.  10/23/2022, 10:40 AM

## 2022-10-23 NOTE — Progress Notes (Signed)
[facility] NEPHROLOGY PROGRESS NOTE  Assessment/ Plan: Pt is a 62 y.o. yo female with medical history of alcohol abuse, liver cirrhosis, thrombocytopenia, hypertension, MDD who was admitted on 9/9 with seizure, sepsis due to E. coli bacteremia and AKI.  # Sepsis secondary due to Shiga like toxin E. coli bacteremia: Currently on ceftriaxone and supportive treatment.  In ICU.  # Nonoliguric AKI likely ischemic ATN in the setting of sepsis/ongoing GI loss, poor oral intake with intravascular depletion.  Concern for HUS given STEC diagnosis. S/p one dose of eculizumab on 9/14. Cr is normal at baseline -Continue to hold diuretics. Cr stable today 3.78. Although receiving TF's, BUN is on the rise therefore will start some gentle fluids for today -Deemed not a candidate for dialysis given underlying comorbidities per previous eval.  Continue current supportive care.  Palliative care following  # Shiga like toxin associated with E. coli bacteremia: Concern for HUS.  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 Receiving ceftriaxone.  Meningococcal vaccination before the infusion.  # Alcohol abuse, liver cirrhosis, protein calorie malnutrition/seizure: Currently on thiamine, s/p CIWA protocol in ICU.  Remains encephalopathic.  # Severe anemia, thrombocytopenia: PRBC/plt transfusion per primary service.  Monitor lab.  # Hypokalemia: Replete potassium chloride and Mag PRN  Overall poor prognosis and recommend hospice and comfort care. Palliative care following. Now DNR Family does not want pressors or dialysis.  Subjective: Seen and examined bedside. Out of ICU now. No acute events. Uop not charted  Objective Vital signs in last 24 hours: Vitals:   10/23/22 0400 10/23/22 0500 10/23/22 0800 10/23/22 0843  BP: (!) 150/79  119/63 132/82  Pulse: (!) 116 (!) 124 (!) 199   Resp: (!) 22 17 (!) 21   Temp:  98.4 F (36.9 C) 98.3 F (36.8 C) 98.4 F  (36.9 C)  TempSrc:  Oral Oral Oral  SpO2: 100% 99% 92%   Weight:      Height:       Weight change:   Intake/Output Summary (Last 24 hours) at 10/23/2022 1112 Last data filed at 10/23/2022 0800 Gross per 24 hour  Intake 1447.45 ml  Output --  Net 1447.45 ml       Labs: RENAL PANEL Recent Labs  Lab 10/19/22 0211 10/19/22 1739 10/20/22 0640 10/20/22 1735 10/21/22 0326 10/22/22 0304 10/23/22 0400  NA 141  --  143  --  142 143 145  K 3.5  --  2.9*  --  4.7 4.4 4.9  CL 98  --  104  --  106 103 106  CO2 27  --  28  --  26 28 27   GLUCOSE 121*  --  119*  --  118* 112* 110*  BUN 72*  --  75*  --  77* 84* 90*  CREATININE 3.61*  --  3.65*  --  3.62* 3.70* 3.78*  CALCIUM 8.4*  --  8.2*  --  8.4* 8.7* 8.5*  MG 1.7 1.6* 1.7 1.6* 1.6* 2.7* 2.4  PHOS 3.5 2.8 2.8 2.5  --   --  3.2  ALBUMIN 2.3*  --  1.8*  --  1.8* 1.9* 1.6*    Liver Function Tests: Recent Labs  Lab 10/21/22 0326 10/22/22 0304 10/23/22 0400  AST 46* 41 40  ALT 21 20 20   ALKPHOS 180* 188* 163*  BILITOT 6.5* 6.7* 5.5*  PROT 5.3* 6.0* 5.5*  ALBUMIN 1.8* 1.9* 1.6*   No results for input(s): "LIPASE", "  AMYLASE" in the last 168 hours. No results for input(s): "AMMONIA" in the last 168 hours. CBC: Recent Labs    10/15/22 0540 10/15/22 2035 10/16/22 0600 10/20/22 0640 10/20/22 1735 10/21/22 0326 10/22/22 0304 10/23/22 0400  HGB 8.0* 7.2*   < > 6.9* 8.2* 8.5* 9.8* 7.5*  MCV 91.6 90.6   < > 88.9  --  87.0 87.9 92.8  VITAMINB12 894  --   --   --   --   --   --   --   FOLATE 13.5  --   --   --   --   --   --   --   FERRITIN 590*  --   --   --   --   --   --   --   TIBC 126*  --   --   --   --   --   --   --   IRON 6*  --   --   --   --   --   --   --   RETICCTPCT  --  0.6  --   --   --   --   --   --    < > = values in this interval not displayed.    Cardiac Enzymes: No results for input(s): "CKTOTAL", "CKMB", "CKMBINDEX", "TROPONINI" in the last 168 hours.  CBG: Recent Labs  Lab 10/22/22 1115  10/22/22 1522 10/22/22 1957 10/23/22 0440 10/23/22 0827  GLUCAP 69* 91 117* 93 100*    Iron Studies: No results for input(s): "IRON", "TIBC", "TRANSFERRIN", "FERRITIN" in the last 72 hours. Studies/Results: DG Wrist 2 Views Right  Result Date: 10/22/2022 CLINICAL DATA:  Right wrist pain. EXAM: RIGHT WRIST - 2 VIEW COMPARISON:  None Available. FINDINGS: There is no evidence of fracture or dislocation. There is no evidence of arthropathy or other focal bone abnormality. There are vascular calcifications. Soft tissues are unremarkable. IMPRESSION: Vascular calcifications. Otherwise negative radiographs of the right wrist. Electronically Signed   By: Narda Rutherford M.D.   On: 10/22/2022 13:00   DG Elbow 2 Views Right  Result Date: 10/22/2022 CLINICAL DATA:  Right elbow pain. EXAM: RIGHT ELBOW - 2 VIEW COMPARISON:  Radiograph 02/05/2022 FINDINGS: Medial and lateral plate and multi screw fixation of the distal humerus. No periprosthetic lucency. Proximal most screws through the medial plate extends into the soft tissues by 5 mm as before. There is no acute fracture. The alignment is normal. No definite elbow joint effusion. No erosive change. IMPRESSION: 1. No acute findings. 2. Stable postsurgical change. Proximal-most fixating screw extends into the soft tissues by 5 mm, without significant interval change Electronically Signed   By: Narda Rutherford M.D.   On: 10/22/2022 12:59   DG Shoulder Right  Result Date: 10/22/2022 CLINICAL DATA:  Right shoulder pain. EXAM: RIGHT SHOULDER - 2+ VIEW COMPARISON:  Radiograph 02/05/2022 FINDINGS: There is no evidence of fracture or dislocation. The acromioclavicular joint is congruent. Chronic glenohumeral arthropathy with joint space narrowing, subchondral sclerosis and cystic change and osteophytes. Possible intra-articular body projecting over the proximal humerus, unchanged. No erosive changes. No acute findings. Soft tissues are unremarkable. IMPRESSION:  Chronic glenohumeral arthropathy. Possible intra-articular body. No acute findings. Electronically Signed   By: Narda Rutherford M.D.   On: 10/22/2022 12:57   VAS [place] UPPER EXTREMITY VENOUS DUPLEX  Result Date: 10/21/2022 UPPER VENOUS STUDY  Patient Name:  Brandy Bishop  Date of Exam:   10/21/2022  Medical Rec #: [id number]           Accession #:    [id number] Date of Birth: October 28, 1960           Patient Gender: F Patient Age:   62 years Exam Location:  [facility] Procedure:      VAS [place] UPPER EXTREMITY VENOUS DUPLEX Referring Phys: Emilie Rutter --------------------------------------------------------------------------------  Indications: Swelling Risk Factors: Immobility. Limitations: Limited range of motion, movement, mid-exam refusal. Comparison Study: No prior study Performing Technologist: Shona Simpson  Examination Guidelines: A complete evaluation includes B-mode imaging, spectral Doppler, color Doppler, and power Doppler as needed of all accessible portions of each vessel. Bilateral testing is considered an integral part of a complete examination. Limited examinations for reoccurring indications may be performed as noted.  Right Findings: +----------+------------+---------+-----------+----------+--------------+ RIGHT     CompressiblePhasicitySpontaneousProperties   Summary     +----------+------------+---------+-----------+----------+--------------+ IJV                                                 Not visualized +----------+------------+---------+-----------+----------+--------------+ Subclavian    Full       Yes       Yes                             +----------+------------+---------+-----------+----------+--------------+ Axillary      Full       Yes       Yes                             +----------+------------+---------+-----------+----------+--------------+ Brachial      Full       Yes       Yes                              +----------+------------+---------+-----------+----------+--------------+ Radial                                              Not visualized +----------+------------+---------+-----------+----------+--------------+ Ulnar                                               Not visualized +----------+------------+---------+-----------+----------+--------------+ Cephalic                                            Not visualized +----------+------------+---------+-----------+----------+--------------+ Basilic                                             Not visualized +----------+------------+---------+-----------+----------+--------------+  Left Findings: +----------+------------+---------+-----------+----------+--------------+ LEFT      CompressiblePhasicitySpontaneousProperties   Summary     +----------+------------+---------+-----------+----------+--------------+ IJV           Full       Yes       Yes                             +----------+------------+---------+-----------+----------+--------------+  Subclavian    Full       Yes       Yes                             +----------+------------+---------+-----------+----------+--------------+ Axillary      Full       Yes       Yes                             +----------+------------+---------+-----------+----------+--------------+ Brachial                                            Not visualized +----------+------------+---------+-----------+----------+--------------+ Radial                                              Not visualized +----------+------------+---------+-----------+----------+--------------+ Ulnar                                               Not visualized +----------+------------+---------+-----------+----------+--------------+ Cephalic                                            Not visualized +----------+------------+---------+-----------+----------+--------------+ Basilic                                              Not visualized +----------+------------+---------+-----------+----------+--------------+  Summary:  Right: No evidence of deep vein thrombosis in the upper extremity. Incomplete examination of arm.  Left: No evidence of deep vein thrombosis in the upper extremity. Incomplete examination of arm.  *See table(s) above for measurements and observations.  Diagnosing physician: Lemar Livings MD Electronically signed by Lemar Livings MD on 10/21/2022 at 4:46:01 PM.    Final     Medications: Infusions:  sodium chloride 10 mL/hr at 10/23/22 0800   cefTRIAXone (ROCEPHIN)  IV 2 g (10/23/22 1006)   feeding supplement (OSMOLITE 1.2 CAL) 55 mL/hr at 10/23/22 0800    Scheduled Medications:  Chlorhexidine Gluconate Cloth  6 each Topical Q0600   folic acid  1 mg Per Tube Daily   mirtazapine  7.5 mg Per Tube QHS   multivitamin with minerals  1 tablet Per Tube Daily   mouth rinse  15 mL Mouth Rinse 4 times per day   pantoprazole (PROTONIX) IV  40 mg Intravenous Daily   sodium chloride flush  10-40 mL Intracatheter Q12H   thiamine  100 mg Per Tube Daily    have reviewed scheduled and prn medications.  Physical Exam: General: chronically ill looking female, awake, NAD Heart:RRR Lungs: CTA b/l Abdomen:soft, Non-tender, non-distended Extremities:No edema Neurology: awake  Brandy Bishop 10/23/2022,11:12 AM  LOS: 9 days

## 2022-10-24 ENCOUNTER — Inpatient Hospital Stay (HOSPITAL_COMMUNITY): Payer: MEDICAID

## 2022-10-24 DIAGNOSIS — N179 Acute kidney failure, unspecified: Secondary | ICD-10-CM | POA: Diagnosis not present

## 2022-10-24 DIAGNOSIS — A419 Sepsis, unspecified organism: Secondary | ICD-10-CM | POA: Diagnosis not present

## 2022-10-24 DIAGNOSIS — E861 Hypovolemia: Secondary | ICD-10-CM | POA: Diagnosis not present

## 2022-10-24 DIAGNOSIS — G9341 Metabolic encephalopathy: Secondary | ICD-10-CM | POA: Diagnosis not present

## 2022-10-24 LAB — CBC WITH DIFFERENTIAL/PLATELET
Abs Immature Granulocytes: 0 10*3/uL (ref 0.00–0.07)
Basophils Absolute: 0.3 10*3/uL — ABNORMAL HIGH (ref 0.0–0.1)
Basophils Relative: 1 %
Eosinophils Absolute: 0.3 10*3/uL (ref 0.0–0.5)
Eosinophils Relative: 1 %
HCT: 18.5 % — ABNORMAL LOW (ref 36.0–46.0)
Hemoglobin: 6.3 g/dL — CL (ref 12.0–15.0)
Lymphocytes Relative: 9 %
Lymphs Abs: 2.3 10*3/uL (ref 0.7–4.0)
MCH: 31.8 pg (ref 26.0–34.0)
MCHC: 34.1 g/dL (ref 30.0–36.0)
MCV: 93.4 fL (ref 80.0–100.0)
Monocytes Absolute: 2.3 10*3/uL — ABNORMAL HIGH (ref 0.1–1.0)
Monocytes Relative: 9 %
Neutro Abs: 20.4 10*3/uL — ABNORMAL HIGH (ref 1.7–7.7)
Neutrophils Relative %: 80 %
Platelets: 48 10*3/uL — ABNORMAL LOW (ref 150–400)
RBC: 1.98 MIL/uL — ABNORMAL LOW (ref 3.87–5.11)
RDW: 17.9 % — ABNORMAL HIGH (ref 11.5–15.5)
WBC: 25.5 10*3/uL — ABNORMAL HIGH (ref 4.0–10.5)
nRBC: 0.1 % (ref 0.0–0.2)
nRBC: 1 /100 WBC — ABNORMAL HIGH

## 2022-10-24 LAB — GLUCOSE, CAPILLARY
Glucose-Capillary: 103 mg/dL — ABNORMAL HIGH (ref 70–99)
Glucose-Capillary: 86 mg/dL (ref 70–99)
Glucose-Capillary: 88 mg/dL (ref 70–99)
Glucose-Capillary: 94 mg/dL (ref 70–99)
Glucose-Capillary: 98 mg/dL (ref 70–99)
Glucose-Capillary: 99 mg/dL (ref 70–99)

## 2022-10-24 LAB — PREPARE RBC (CROSSMATCH)

## 2022-10-24 MED ORDER — SODIUM CHLORIDE 0.9% IV SOLUTION: Freq: Once | INTRAVENOUS | Status: AC

## 2022-10-24 MED ORDER — SODIUM CHLORIDE 0.9 % IV SOLN: INTRAVENOUS | Status: AC

## 2022-10-24 NOTE — Progress Notes (Signed)
[name] PROGRESS NOTE  Assessment/ Plan: Pt is a 62 y.o. yo female with medical history of alcohol abuse, liver cirrhosis, thrombocytopenia, hypertension, MDD who was admitted on 9/9 with seizure, sepsis due to E. coli bacteremia and AKI.  # Sepsis secondary due to Shiga like toxin E. coli bacteremia: Currently on ceftriaxone and supportive treatment.  In ICU.  # Nonoliguric AKI likely ischemic ATN in the setting of sepsis/ongoing GI loss, poor oral intake with intravascular depletion.  Concern for HUS given STEC diagnosis. S/p one dose of eculizumab on 9/14. Cr is normal at baseline -Continue to hold diuretics. Cr stable yesterday 3.78. Although receiving TF's, I'll order fluids for today especially given she looks more on the dry side and has been tachycardic -Deemed not a candidate for dialysis given underlying comorbidities per previous eval.  Continue current supportive care.  Palliative care following  # Shiga like toxin associated with E. coli bacteremia: Concern for HUS.  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 Receiving ceftriaxone.  Meningococcal vaccination before the infusion.  # Alcohol abuse, liver cirrhosis, protein calorie malnutrition/seizure: Currently on thiamine, s/p CIWA protocol in ICU.  Remains encephalopathic.  # Severe anemia, thrombocytopenia: PRBC/plt transfusion per primary service.  Monitor lab.  # Hypokalemia: Replete potassium chloride and Mag PRN  Overall poor prognosis and recommend hospice and comfort care. Palliative care following. Now DNR Family does not want pressors or dialysis. Family meeting planned for tomorrow 10:30am.  Subjective: Seen and examined bedside. No acute events. RFP not drawn yet  Objective Vital signs in last 24 hours: Vitals:   10/24/22 0405 10/24/22 0550 10/24/22 0648 10/24/22 0844  BP: (!) 140/77   (!) 148/82  Pulse:  (!) 128 (!) 122 (!) 111  Resp: (!) 23  17 18  (!) 24  Temp: (!) 101.7 F (38.7 C)  99.6 F (37.6 C) 99.5 F (37.5 C)  TempSrc: Axillary  Axillary   SpO2:  100% 100% 100%  Weight:      Height:       Weight change:   Intake/Output Summary (Last 24 hours) at 10/24/2022 1109 Last data filed at 10/24/2022 0243 Gross per 24 hour  Intake 1034.47 ml  Output 900 ml  Net 134.47 ml       Labs: RENAL PANEL Recent Labs  Lab 10/19/22 0211 10/19/22 1739 10/20/22 0640 10/20/22 1735 10/21/22 0326 10/22/22 0304 10/23/22 0400  NA 141  --  143  --  142 143 145  K 3.5  --  2.9*  --  4.7 4.4 4.9  CL 98  --  104  --  106 103 106  CO2 27  --  28  --  26 28 27   GLUCOSE 121*  --  119*  --  118* 112* 110*  BUN 72*  --  75*  --  77* 84* 90*  CREATININE 3.61*  --  3.65*  --  3.62* 3.70* 3.78*  CALCIUM 8.4*  --  8.2*  --  8.4* 8.7* 8.5*  MG 1.7 1.6* 1.7 1.6* 1.6* 2.7* 2.4  PHOS 3.5 2.8 2.8 2.5  --   --  3.2  ALBUMIN 2.3*  --  1.8*  --  1.8* 1.9* 1.6*    Liver Function Tests: Recent Labs  Lab 10/21/22 0326 10/22/22 0304 10/23/22 0400  AST 46* 41 40  ALT 21 20 20   ALKPHOS 180* 188* 163*  BILITOT 6.5* 6.7* 5.5*  PROT 5.3* 6.0* 5.5*  ALBUMIN 1.8* 1.9* 1.6*   No results for input(s): "LIPASE", "AMYLASE" in the last 168 hours. No results for input(s): "AMMONIA" in the last 168 hours. CBC: Recent Labs    10/15/22 0540 10/15/22 2035 10/16/22 0600 10/20/22 1735 10/21/22 0326 10/22/22 0304 10/23/22 0400 10/24/22 0354  HGB 8.0* 7.2*   < > 8.2* 8.5* 9.8* 7.5* 6.3*  MCV 91.6 90.6   < >  --  87.0 87.9 92.8 93.4  VITAMINB12 894  --   --   --   --   --   --   --   FOLATE 13.5  --   --   --   --   --   --   --   FERRITIN 590*  --   --   --   --   --   --   --   TIBC 126*  --   --   --   --   --   --   --   IRON 6*  --   --   --   --   --   --   --   RETICCTPCT  --  0.6  --   --   --   --   --   --    < > = values in this interval not displayed.    Cardiac Enzymes: No results for input(s): "CKTOTAL", "CKMB", "CKMBINDEX",  "TROPONINI" in the last 168 hours.  CBG: Recent Labs  Lab 10/23/22 1724 10/23/22 2002 10/23/22 2347 10/24/22 0402 10/24/22 0842  GLUCAP 85 99 116* 86 99    Iron Studies: No results for input(s): "IRON", "TIBC", "TRANSFERRIN", "FERRITIN" in the last 72 hours. Studies/Results: No results found.  Medications: Infusions:  sodium chloride Stopped (10/23/22 1249)   sodium chloride 75 mL/hr at 10/24/22 0242   cefTRIAXone (ROCEPHIN)  IV 2 g (10/24/22 0942)   feeding supplement (OSMOLITE 1.2 CAL) 1,000 mL (10/23/22 1816)    Scheduled Medications:  sodium chloride   Intravenous Once   folic acid  1 mg Per Tube Daily   mirtazapine  7.5 mg Per Tube QHS   multivitamin with minerals  1 tablet Per Tube Daily   mouth rinse  15 mL Mouth Rinse 4 times per day   pantoprazole (PROTONIX) IV  40 mg Intravenous Daily   sodium chloride flush  10-40 mL Intracatheter Q12H   thiamine  100 mg Per Tube Daily    have reviewed scheduled and prn medications.  Physical Exam: General: chronically ill looking female, awake, NAD HEENT: dry MM Heart: tachycardic, regular rhythm Lungs: CTA b/l Abdomen:soft, Non-tender, non-distended Extremities:No edema Neurology: awake  Anthony Sar 10/24/2022,11:09 AM  LOS: 10 days

## 2022-10-24 NOTE — Progress Notes (Signed)
PROGRESS NOTE        PATIENT DETAILS Name: Brandy Bishop Age: 62 y.o. Sex: female Date of Birth: [birth year] Admit Date: 10/14/2022 Admitting Physician A Grier Mitts., MD NGE:[id number], Maxine Glenn, NP  Brief Summary: Patient is a 62 y.o.  female with history of EtOH use, HTN, asthma-who presented with confusion/body aches/diarrhea-she was initially thought to have sepsis physiology due to pyelonephritis-however her stool studies was positive for Shiga like toxin E. coli-subsequent blood cultures came back positive for E. coli as well.   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  Unfortunately-she continued to deteriorate-after discussion with palliative care she was made a DNR.  She was not considered a dialysis candidate.  See below for further details.  Significant events: 9/9>> admission to  [facility] 9/11>> CCM consult for increased work of breathing/pulm edema. 9/12>>stool test positive for STEC, STEC-HUS being considered 9/14>>: Oncology consulted, given 1 dose of eculizumab, made DNR after discussion with palliative care 9/17: Transferred to the hospitalist service  Significant studies: 9/9>> CXR: No active disease 9/9>> RUQ ultrasound: No bowel stones. 9/9>> CT abdomen: No acute intracranial abnormality 9/9>> CT abdomen/pelvis: Bile lateral perinephric stranding, moderate air/fluid distention of the colon/rectum. 9/11>> CXR: Interval development of pulm edema 9/16>> bilateral upper extremity Doppler: No DVT  Significant microbiology data: 9/09>> COVID/influenza PCR: Negative 9/09>> blood culture: E. Coli 9/10>> GI pathogen panel: Shiga like toxin producing E. coli. 9/10>> C. difficile: Negative 9/15>> blood culture: No growth  Procedures: 9/13>>Cortrak tube  Consults: PCCM Nephrology Palliative care Oncology  Subjective: Febrile  overnight-essentially unchanged-some confusion but answering some questions today.  Remains in restraints.  Objective: Vitals: Blood pressure (!) 148/82, pulse (!) 111, temperature 99.5 F (37.5 C), resp. rate (!) 24, height 5\' 3"  (1.6 m), weight 50.8 kg, SpO2 100%.   Exam: Gen Exam:not in any distress HEENT:atraumatic, normocephalic Chest: B/L clear to auscultation anteriorly CVS:S1S2 regular Abdomen:soft non tender, non distended Extremities:no edema Neurology: Non focal Skin: no rash  Pertinent Labs/Radiology:    Latest Ref Rng & Units 10/24/2022    3:54 AM 10/23/2022    4:00 AM 10/22/2022    3:04 AM  CBC  WBC 4.0 - 10.5 K/uL 25.5  25.5  26.8   Hemoglobin 12.0 - 15.0 g/dL 6.3  7.5  9.8   Hematocrit 36.0 - 46.0 % 18.5  21.9  28.4   Platelets 150 - 400 K/uL 48  30  24     Lab Results  Component Value Date   NA 145 10/23/2022   K 4.9 10/23/2022   CL 106 10/23/2022   CO2 27 10/23/2022      Assessment/Plan: Severe sepsis due to E. coli bacteremia in the setting of gastroenteritis due to Shigella producing E. Coli Sepsis physiology overall improved-however febrile last night Repeat blood cultures today, get CXR to ensure no aspiration Rocephin x 14-day planned.  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   AKI Suspicion for ATN in the setting of sepsis versus HUS syndrome Nonoliguric Nephrology following-however not a candidate for HD Avoid nephrotoxic agents and follow  Thrombocytopenia Either from HUS syndrome physiology or due to severe sepsis-in a background of chronic  thrombocytopenia from EtOH use. Improving Follow CBC  Acute metabolic encephalopathy Multifactorial-from sepsis/AKI/alcohol withdrawal (should be out of window by now) Remains significantly confused In restraints-but seems to be moving  all 4 extremities Supportive care Delirium precautions  Oropharyngeal dysphagia In the setting of encephalopathy/debility/deconditioning NG tube feedings ongoing Goals of care discussions ongoing  Normocytic anemia Secondary to acute illness/AKI No evidence of GI bleeding 1 unit of PRBC ordered as hemoglobin down to 6.3 today. Follow CBC  EtOH use Should be out of the window for any withdrawal symptoms No longer on benzos Supportive care-MVI/thiamine/folate  HTN BP stable All antihypertensives on hold  RUE  edema/pain Imaging/Dopplers negative Supportive care  Palliative care/goals of care DNR No signs of renal recovery-not a HD candidate remains severely encephalopathic Poor overall prognosis Palliative care following and engaging with family-not unreasonable to consider hospice care at this time. Will await further goals of care discussion-but per last palliative care note-no further escalation in care.  Palliative care meeting scheduled with brother tomorrow-I reached out to him today-he understands tenuous clinical situation.  Nutrition Status: Nutrition Problem: Severe Malnutrition Etiology: chronic illness (chronic pancreatitis, liver disease) Signs/Symptoms: severe fat depletion, severe muscle depletion Interventions: Tube feeding, MVI, Refer to RD note for recommendations  Underweight: Estimated body mass index is 19.84 kg/m as calculated from the following:   Height as of this encounter: 5\' 3"  (1.6 m).   Weight as of this encounter: 50.8 kg.   Code status:   Code Status: Limited: Do not attempt resuscitation (DNR) -DNR-LIMITED -Do Not Intubate/DNI    DVT Prophylaxis: SCDs Start: 10/14/22 2019   Family Communication: Brother [phone] updated over the phone on 9/19   Disposition Plan: Status is: Inpatient Remains inpatient appropriate because: Severity of illness   Planned Discharge Destination:Skilled nursing facility versus   hospice   Diet: Diet Order             Diet full liquid Room service appropriate? No; Fluid consistency: Thin  Diet effective now                     Antimicrobial agents: Anti-infectives (From admission, onward)    Start     Dose/Rate Route Frequency Ordered Stop   10/15/22 1000  cefTRIAXone (ROCEPHIN) 1 g in sodium chloride 0.9 % 100 mL IVPB  Status:  Discontinued        1 g 200 mL/hr over 30 Minutes Intravenous Every 24 hours 10/14/22 1746 10/15/22 0018   10/15/22 1000  cefTRIAXone (ROCEPHIN) 2 g in sodium chloride 0.9 % 100 mL IVPB        2 g 200 mL/hr over 30 Minutes Intravenous Every 24 hours 10/15/22 0018     10/14/22 1000  ceFEPIme (MAXIPIME) 2 g in sodium chloride 0.9 % 100 mL IVPB        2 g 200 mL/hr over 30 Minutes Intravenous  Once 10/14/22 0946 10/14/22 1541   10/14/22 0945  aztreonam (AZACTAM) 2 g in sodium chloride 0.9 % 100 mL IVPB  Status:  Discontinued        2 g 200 mL/hr over 30 Minutes Intravenous  Once 10/14/22 0943 10/14/22 0946   10/14/22 0945  metroNIDAZOLE (FLAGYL) IVPB 500 mg        500 mg 100 mL/hr over 60 Minutes Intravenous  Once 10/14/22 0943 10/14/22 1309   10/14/22 0945  vancomycin (VANCOCIN) IVPB 1000 mg/200 mL premix        1,000 mg 200 mL/hr over 60  Minutes Intravenous  Once 10/14/22 0943 10/14/22 1643        MEDICATIONS: Scheduled Meds:  sodium chloride   Intravenous Once   folic acid  1 mg Per Tube Daily   mirtazapine  7.5 mg Per Tube QHS   multivitamin with minerals  1 tablet Per Tube Daily   mouth rinse  15 mL Mouth Rinse 4 times per day   pantoprazole (PROTONIX) IV  40 mg Intravenous Daily   sodium chloride flush  10-40 mL Intracatheter Q12H   thiamine  100 mg Per Tube Daily   Continuous Infusions:  sodium chloride Stopped (10/23/22 1249)   sodium chloride 75 mL/hr at 10/24/22 0242   cefTRIAXone (ROCEPHIN)  IV 2 g (10/24/22 0942)   feeding supplement (OSMOLITE 1.2 CAL) 1,000 mL (10/23/22 1816)   PRN Meds:.sodium  chloride, acetaminophen **OR** acetaminophen, fentaNYL (SUBLIMAZE) injection, guaiFENesin, hydrALAZINE, labetalol, mouth rinse, sodium chloride flush, traMADol, white petrolatum   I have personally reviewed following labs and imaging studies  LABORATORY DATA: CBC: Recent Labs  Lab 10/20/22 0640 10/20/22 1735 10/21/22 0326 10/22/22 0304 10/23/22 0400 10/24/22 0354  WBC 22.4*  --  26.7* 26.8* 25.5* 25.5*  NEUTROABS 18.2*  --  21.8* 21.9* 20.6* 20.4*  HGB 6.9* 8.2* 8.5* 9.8* 7.5* 6.3*  HCT 19.2* 23.0* 24.1* 28.4* 21.9* 18.5*  MCV 88.9  --  87.0 87.9 92.8 93.4  PLT 19*  --  20* 24* 30* 48*    Basic Metabolic Panel: Recent Labs  Lab 10/19/22 0211 10/19/22 1739 10/20/22 0640 10/20/22 1735 10/21/22 0326 10/22/22 0304 10/23/22 0400  NA 141  --  143  --  142 143 145  K 3.5  --  2.9*  --  4.7 4.4 4.9  CL 98  --  104  --  106 103 106  CO2 27  --  28  --  26 28 27   GLUCOSE 121*  --  119*  --  118* 112* 110*  BUN 72*  --  75*  --  77* 84* 90*  CREATININE 3.61*  --  3.65*  --  3.62* 3.70* 3.78*  CALCIUM 8.4*  --  8.2*  --  8.4* 8.7* 8.5*  MG 1.7 1.6* 1.7 1.6* 1.6* 2.7* 2.4  PHOS 3.5 2.8 2.8 2.5  --   --  3.2    GFR: Estimated Creatinine Clearance: 12.5 mL/min (A) (by C-G formula based on SCr of 3.78 mg/dL (H)).  Liver Function Tests: Recent Labs  Lab 10/19/22 0211 10/20/22 0640 10/21/22 0326 10/22/22 0304 10/23/22 0400  AST 65* 33 46* 41 40  ALT 27 20 21 20 20   ALKPHOS 147* 123 180* 188* 163*  BILITOT 6.2* 4.9* 6.5* 6.7* 5.5*  PROT 5.5* 5.1* 5.3* 6.0* 5.5*  ALBUMIN 2.3* 1.8* 1.8* 1.9* 1.6*   No results for input(s): "LIPASE", "AMYLASE" in the last 168 hours. No results for input(s): "AMMONIA" in the last 168 hours.  Coagulation Profile: Recent Labs  Lab 10/17/22 2100  INR 1.4*    Cardiac Enzymes: No results for input(s): "CKTOTAL", "CKMB", "CKMBINDEX", "TROPONINI" in the last 168 hours.  BNP (last 3 results) No results for input(s): "PROBNP" in the last  8760 hours.  Lipid Profile: No results for input(s): "CHOL", "HDL", "LDLCALC", "TRIG", "CHOLHDL", "LDLDIRECT" in the last 72 hours.  Thyroid Function Tests: No results for input(s): "TSH", "T4TOTAL", "FREET4", "T3FREE", "THYROIDAB" in the last 72 hours.  Anemia Panel: No results for input(s): "VITAMINB12", "FOLATE", "FERRITIN", "TIBC", "IRON", "RETICCTPCT" in the last 72 hours.  Urine analysis:    Component Value Date/Time   COLORURINE AMBER (A) 10/14/2022 1600   APPEARANCEUR TURBID (A) 10/14/2022 1600   LABSPEC 1.017 10/14/2022 1600   PHURINE 5.0 10/14/2022 1600   GLUCOSEU NEGATIVE 10/14/2022 1600   HGBUR SMALL (A) 10/14/2022 1600   BILIRUBINUR SMALL (A) 10/14/2022 1600   KETONESUR NEGATIVE 10/14/2022 1600   PROTEINUR 100 (A) 10/14/2022 1600   UROBILINOGEN 0.2 04/14/2014 1419   NITRITE NEGATIVE 10/14/2022 1600   LEUKOCYTESUR MODERATE (A) 10/14/2022 1600    Sepsis Labs: Lactic Acid, Venous    Component Value Date/Time   LATICACIDVEN 1.2 10/17/2022 2143    MICROBIOLOGY: Recent Results (from the past 240 hour(s))  SARS Coronavirus 2 by RT PCR (hospital order, performed in [facility] hospital lab) *cepheid single result test* Anterior Nasal Swab     Status: None   Collection Time: 10/14/22  5:26 PM   Specimen: Anterior Nasal Swab  Result Value Ref Range Status   SARS Coronavirus 2 by RT PCR NEGATIVE NEGATIVE Final    Comment: Performed at [facility], [address], [place], [place] [address]  Resp Panel by RT-PCR (Flu A&B, Covid)     Status: None   Collection Time: 10/14/22  5:26 PM  Result Value Ref Range Status   SARS Coronavirus 2 by RT PCR NEGATIVE NEGATIVE Final   Influenza A by PCR NEGATIVE NEGATIVE Final   Influenza B by PCR NEGATIVE NEGATIVE Final    Comment: (NOTE) The Xpert Xpress SARS-CoV-2/FLU/RSV plus assay is intended as an aid in the diagnosis of influenza from Nasopharyngeal swab specimens and should not be used as a sole basis for treatment.  Nasal washings and aspirates are unacceptable for Xpert Xpress SARS-CoV-2/FLU/RSV testing.  Fact Sheet for Patients: BloggerCourse.com  Fact Sheet for Healthcare Providers: SeriousBroker.it  This test is not yet approved or cleared by the [place] FDA and has been authorized for detection and/or diagnosis of SARS-CoV-2 by FDA under an Emergency Use Authorization (EUA). This EUA will remain in effect (meaning this test can be used) for the duration of the COVID-19 declaration under Section 564(b)(1) of the Act, 21 U.S.C. section 360bbb-3(b)(1), unless the authorization is terminated or revoked.  Performed at [facility], [address] [place], [place] [address]   MRSA Next Gen by PCR, Nasal     Status: None   Collection Time: 10/14/22  8:27 PM   Specimen: Nasal Mucosa; Nasal Swab  Result Value Ref Range Status   MRSA by PCR Next Gen NOT DETECTED NOT DETECTED Final    Comment: (NOTE) The GeneXpert MRSA Assay (FDA approved for NASAL specimens only), is one component of a comprehensive MRSA colonization surveillance program. It is not intended to diagnose MRSA infection nor to guide or monitor treatment for MRSA infections. Test performance is not FDA approved in patients less than 40 years old. Performed at [facility], [address], [place], [place] [address]   Blood Culture (routine x 2)     Status: None   Collection Time: 10/14/22  9:34 PM   Specimen: BLOOD LEFT HAND  Result Value Ref Range Status   Specimen Description BLOOD LEFT HAND  Final   Special Requests   Final    BOTTLES DRAWN AEROBIC AND ANAEROBIC Blood Culture adequate volume   Culture   Final    NO GROWTH 5 DAYS Performed at [facility], [address], [place], [place] [address]    Report Status  10/19/2022 FINAL  Final  C Difficile Quick Screen w PCR reflex     Status: None   Collection Time: 10/15/22  6:09 PM   Specimen: STOOL   Result Value Ref Range Status   C Diff antigen NEGATIVE NEGATIVE Final   C Diff toxin NEGATIVE NEGATIVE Final   C Diff interpretation No C. difficile detected.  Final    Comment: Performed at [facility], [address], [place], [place] [address]  Gastrointestinal Panel by PCR , Stool     Status: Abnormal   Collection Time: 10/15/22  6:09 PM   Specimen: Stool  Result Value Ref Range Status   Campylobacter species NOT DETECTED NOT DETECTED Final   Plesimonas shigelloides NOT DETECTED NOT DETECTED Final   Salmonella species NOT DETECTED NOT DETECTED Final   Yersinia enterocolitica NOT DETECTED NOT DETECTED Final   Vibrio species NOT DETECTED NOT DETECTED Final   Vibrio cholerae NOT DETECTED NOT DETECTED Final   Enteroaggregative E coli (EAEC) NOT DETECTED NOT DETECTED Final   Enterotoxigenic E coli (ETEC) NOT DETECTED NOT DETECTED Final   Shiga like toxin producing E coli (STEC) DETECTED (A) NOT DETECTED Final    Comment: RESULT CALLED TO, READ BACK BY AND VERIFIED WITH: ROSELOE ZARSONA 2212 10/16/22 MU    E. coli O157 NOT DETECTED NOT DETECTED Final   Shigella/Enteroinvasive E coli (EIEC) NOT DETECTED NOT DETECTED Final   Cryptosporidium NOT DETECTED NOT DETECTED Final   Cyclospora cayetanensis NOT DETECTED NOT DETECTED Final   Entamoeba histolytica NOT DETECTED NOT DETECTED Final   Giardia lamblia NOT DETECTED NOT DETECTED Final   Adenovirus F40/41 NOT DETECTED NOT DETECTED Final   Astrovirus NOT DETECTED NOT DETECTED Final   Norovirus GI/GII NOT DETECTED NOT DETECTED Final   Rotavirus A NOT DETECTED NOT DETECTED Final   Sapovirus (I, II, IV, and V) NOT DETECTED NOT DETECTED Final    Comment: Performed at [facility], [address] [place], [place] [address]  MRSA Next Gen by PCR, Nasal     Status: None   Collection Time: 10/17/22  9:30 PM   Specimen: Nasal Mucosa; Nasal Swab  Result Value Ref Range Status   MRSA by PCR Next Gen NOT DETECTED NOT DETECTED  Final    Comment: (NOTE) The GeneXpert MRSA Assay (FDA approved for NASAL specimens only), is one component of a comprehensive MRSA colonization surveillance program. It is not intended to diagnose MRSA infection nor to guide or monitor treatment for MRSA infections. Test performance is not FDA approved in patients less than 53 years old. Performed at [facility], [address], [place], [place] [address]   Culture, blood (Routine X 2) w Reflex to ID Panel     Status: None (Preliminary result)   Collection Time: 10/20/22  2:36 PM   Specimen: BLOOD LEFT HAND  Result Value Ref Range Status   Specimen Description BLOOD LEFT HAND  Final   Special Requests   Final    BOTTLES DRAWN AEROBIC AND ANAEROBIC Blood Culture results may not be optimal due to an inadequate volume of blood received in culture bottles   Culture   Final    NO GROWTH 4 DAYS Performed at [facility], [address] [place], [place] [address]    Report Status PENDING  Incomplete  Culture, blood (Routine X 2) w Reflex to ID Panel     Status: None (Preliminary result)   Collection Time: 10/20/22  2:36 PM  Specimen: BLOOD LEFT HAND  Result Value Ref Range Status   Specimen Description BLOOD LEFT HAND  Final   Special Requests   Final    BOTTLES DRAWN AEROBIC AND ANAEROBIC Blood Culture results may not be optimal due to an inadequate volume of blood received in culture bottles   Culture   Final    NO GROWTH 4 DAYS Performed at [facility], [address] [place], [place] [address]    Report Status PENDING  Incomplete    RADIOLOGY STUDIES/RESULTS: DG Wrist 2 Views Right  Result Date: 10/22/2022 CLINICAL DATA:  Right wrist pain. EXAM: RIGHT WRIST - 2 VIEW COMPARISON:  None Available. FINDINGS: There is no evidence of fracture or dislocation. There is no evidence of arthropathy or other focal bone abnormality. There are vascular calcifications. Soft tissues are unremarkable. IMPRESSION: Vascular  calcifications. Otherwise negative radiographs of the right wrist. Electronically Signed   By: Narda Rutherford M.D.   On: 10/22/2022 13:00   DG Elbow 2 Views Right  Result Date: 10/22/2022 CLINICAL DATA:  Right elbow pain. EXAM: RIGHT ELBOW - 2 VIEW COMPARISON:  Radiograph 02/05/2022 FINDINGS: Medial and lateral plate and multi screw fixation of the distal humerus. No periprosthetic lucency. Proximal most screws through the medial plate extends into the soft tissues by 5 mm as before. There is no acute fracture. The alignment is normal. No definite elbow joint effusion. No erosive change. IMPRESSION: 1. No acute findings. 2. Stable postsurgical change. Proximal-most fixating screw extends into the soft tissues by 5 mm, without significant interval change Electronically Signed   By: Narda Rutherford M.D.   On: 10/22/2022 12:59   DG Shoulder Right  Result Date: 10/22/2022 CLINICAL DATA:  Right shoulder pain. EXAM: RIGHT SHOULDER - 2+ VIEW COMPARISON:  Radiograph 02/05/2022 FINDINGS: There is no evidence of fracture or dislocation. The acromioclavicular joint is congruent. Chronic glenohumeral arthropathy with joint space narrowing, subchondral sclerosis and cystic change and osteophytes. Possible intra-articular body projecting over the proximal humerus, unchanged. No erosive changes. No acute findings. Soft tissues are unremarkable. IMPRESSION: Chronic glenohumeral arthropathy. Possible intra-articular body. No acute findings. Electronically Signed   By: Narda Rutherford M.D.   On: 10/22/2022 12:57     LOS: 10 days   Jeoffrey Massed, MD  [facility]    To contact the attending provider between 7A-7P or the covering provider during after hours 7P-7A, please log into the web site [email or web address] and access using universal [place] password for that web site. If you do not have the password, please call the hospital operator.  10/24/2022, 9:54 AM

## 2022-10-25 DIAGNOSIS — Z515 Encounter for palliative care: Secondary | ICD-10-CM | POA: Diagnosis not present

## 2022-10-25 DIAGNOSIS — G9341 Metabolic encephalopathy: Secondary | ICD-10-CM | POA: Diagnosis not present

## 2022-10-25 DIAGNOSIS — A419 Sepsis, unspecified organism: Secondary | ICD-10-CM | POA: Diagnosis not present

## 2022-10-25 DIAGNOSIS — N179 Acute kidney failure, unspecified: Secondary | ICD-10-CM | POA: Diagnosis not present

## 2022-10-25 LAB — GLUCOSE, CAPILLARY
Glucose-Capillary: 98 mg/dL (ref 70–99)
Glucose-Capillary: 92 mg/dL (ref 70–99)
Glucose-Capillary: 93 mg/dL (ref 70–99)
Glucose-Capillary: 95 mg/dL (ref 70–99)
Glucose-Capillary: 102 mg/dL — ABNORMAL HIGH (ref 70–99)

## 2022-10-25 LAB — CBC WITH DIFFERENTIAL/PLATELET
Abs Immature Granulocytes: 0.29 10*3/uL — ABNORMAL HIGH (ref 0.00–0.07)
Basophils Absolute: 0.1 10*3/uL (ref 0.0–0.1)
Basophils Relative: 0 %
Eosinophils Absolute: 0.3 10*3/uL (ref 0.0–0.5)
Eosinophils Relative: 1 %
HCT: 21.6 % — ABNORMAL LOW (ref 36.0–46.0)
Hemoglobin: 7.4 g/dL — ABNORMAL LOW (ref 12.0–15.0)
Immature Granulocytes: 1 %
Lymphocytes Relative: 11 %
Lymphs Abs: 2.9 10*3/uL (ref 0.7–4.0)
MCH: 29.7 pg (ref 26.0–34.0)
MCHC: 34.3 g/dL (ref 30.0–36.0)
MCV: 86.7 fL (ref 80.0–100.0)
Monocytes Absolute: 2.7 10*3/uL — ABNORMAL HIGH (ref 0.1–1.0)
Monocytes Relative: 10 %
Neutro Abs: 20 10*3/uL — ABNORMAL HIGH (ref 1.7–7.7)
Neutrophils Relative %: 77 %
Platelets: 64 10*3/uL — ABNORMAL LOW (ref 150–400)
RBC: 2.49 MIL/uL — ABNORMAL LOW (ref 3.87–5.11)
RDW: 20.5 % — ABNORMAL HIGH (ref 11.5–15.5)
WBC: 26.3 10*3/uL — ABNORMAL HIGH (ref 4.0–10.5)
nRBC: 0.1 % (ref 0.0–0.2)

## 2022-10-25 LAB — CULTURE, BLOOD (ROUTINE X 2)
Culture: NO GROWTH
Culture: NO GROWTH

## 2022-10-25 LAB — PROCALCITONIN: Procalcitonin: 6.85 ng/mL

## 2022-10-25 MED ORDER — METRONIDAZOLE 500 MG/100ML IV SOLN
500.0000 mg | Freq: Two times a day (BID) | INTRAVENOUS | Status: DC
  Administered 2022-10-25 (×2): 500 mg via INTRAVENOUS
  Filled 2022-10-25 (×2): qty 100

## 2022-10-25 NOTE — Progress Notes (Signed)
   10/25/22 0402  Assess: MEWS Score  Temp (!) 101 F (38.3 C)  BP (!) 145/80  MAP (mmHg) 94  Pulse Rate (!) 115  ECG Heart Rate (!) 116  Resp (!) 29  SpO2 99 %  O2 Device Room Air  Assess: MEWS Score  MEWS Temp 1  MEWS Systolic 0  MEWS Pulse 2  MEWS RR 2  MEWS LOC 0  MEWS Score 5  MEWS Score Color Red  Assess: if the MEWS score is Yellow or Red  Were vital signs accurate and taken at a resting state? Yes  Does the patient meet 2 or more of the SIRS criteria? Yes  Does the patient have a confirmed or suspected source of infection? Yes  MEWS guidelines implemented  Yes, red  Treat  MEWS Interventions Considered administering scheduled or prn medications/treatments as ordered  Take Vital Signs  Increase Vital Sign Frequency  Red: Q1hr x2, continue Q4hrs until patient remains green for 12hrs  Escalate  MEWS: Escalate Red: Discuss with charge nurse and notify provider. Consider notifying RRT. If remains red for 2 hours consider need for higher level of care  Notify: Charge Nurse/RN  Name of Charge Nurse/RN Notified Doy Mince, RN  Provider Notification  Provider Name/Title Delma Post, MD  Date Provider Notified 10/25/22  Time Provider Notified [phone]  Method of Notification Page  Notification Reason  (new red mews)  Provider response Other (Comment) (provider messaged RN back)  Date of Provider Response 10/25/22  Time of Provider Response 0447  Assess: SIRS CRITERIA  SIRS Temperature  1  SIRS Pulse 1  SIRS Respirations  1  SIRS WBC 0  SIRS Score Sum  3

## 2022-10-25 NOTE — Progress Notes (Signed)
   10/25/22 1128  Spiritual Encounters  Type of Visit Initial  Care provided to: Family  Referral source Family  Reason for visit End-of-life  OnCall Visit No  Spiritual Framework  Presenting Themes Rituals and practive  Interventions  Spiritual Care Interventions Made Prayer  Intervention Outcomes  Outcomes Connection to spiritual care   Family requested prayer with patient. Prayed with patient and family members. Patient welcomed follow-up visit.

## 2022-10-25 NOTE — Progress Notes (Signed)
PROGRESS NOTE        PATIENT DETAILS Name: Brandy Bishop Age: 62 y.o. Sex: female Date of Birth: 11-08-1960 Admit Date: 10/14/2022 Admitting Physician A Grier Mitts., MD [id number], Maxine Glenn, NP  Brief Summary: Patient is a 62 y.o.  female with history of EtOH use, HTN, asthma-who presented with confusion/body aches/diarrhea-she was initially thought to have sepsis physiology due to pyelonephritis-however her stool studies was positive for Shiga like toxin E. coli-subsequent blood cultures came back positive for E. coli as well.   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  Unfortunately-she continued to deteriorate-after discussion with palliative care she was made a DNR.  She was not considered a dialysis candidate.  See below for further details.  Significant events: 9/9>> admission to [facility] 9/11>> CCM consult for increased work of breathing/pulm edema. 9/12>>stool test positive for STEC, STEC-HUS being considered 9/14>>: Oncology consulted, given 1 dose of eculizumab, made DNR after discussion with palliative care 9/17: Transferred to the hospitalist service  Significant studies: 9/9>> CXR: No active disease 9/9>> RUQ ultrasound: No bowel stones. 9/9>> CT abdomen: No acute intracranial abnormality 9/9>> CT abdomen/pelvis: Bile lateral perinephric stranding, moderate air/fluid distention of the colon/rectum. 9/11>> CXR: Interval development of pulm edema 9/16>> bilateral upper extremity Doppler: No DVT  Significant microbiology data: 9/09>> COVID/influenza PCR: Negative 9/09>> blood culture: E. Coli 9/10>> GI pathogen panel: Shiga like toxin producing E. coli. 9/10>> C. difficile: Negative 9/15>> blood culture: No growth  Procedures: 9/13>>Cortrak tube  Consults: PCCM Nephrology Palliative care Oncology  Subjective: Febrile overnight  again-looks like blood cultures ordered on 9/19 was never obtained.  She appears essentially the same-some confusion-follows some commands.  Remains in restraints.  Objective: Vitals: Blood pressure 122/69, pulse (!) 115, temperature 100 F (37.8 C), temperature source Oral, resp. rate (!) 33, height 5\' 3"  (1.6 m), weight 55.1 kg, SpO2 99%.   Exam: Gen Exam:not in any distress-confused-in restraints. HEENT:atraumatic, normocephalic Chest: B/L clear to auscultation anteriorly CVS:S1S2 regular Abdomen:soft non tender, non distended Extremities:no edema Neurology: Moving all 4 extremities. Skin: no rash  Pertinent Labs/Radiology:    Latest Ref Rng & Units 10/25/2022    4:08 AM 10/24/2022    3:54 AM 10/23/2022    4:00 AM  CBC  WBC 4.0 - 10.5 K/uL 26.3  25.5  25.5   Hemoglobin 12.0 - 15.0 g/dL 7.4  6.3  7.5   Hematocrit 36.0 - 46.0 % 21.6  18.5  21.9   Platelets 150 - 400 K/uL 64  48  30     Lab Results  Component Value Date   NA 145 10/23/2022   K 4.9 10/23/2022   CL 106 10/23/2022   CO2 27 10/23/2022      Assessment/Plan: Severe sepsis due to E. coli bacteremia in the setting of gastroenteritis due to Shigella producing E. Coli Although sepsis physiology improved-still febrile for the past 2 days  Unfortunately blood culture was never drawn in spite of being ordered on 9/19 CXR without any obvious signs of aspiration-some atelectasis on the right lower base. On Rocephin Palliative care to meet with family today-for goals of care discussion.  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    AKI Suspicion for ATN in the setting of sepsis versus HUS syndrome Nonoliguric Nephrology following-however not a candidate for HD Avoid nephrotoxic agents and follow  Thrombocytopenia Either from HUS syndrome physiology or due to  severe sepsis-in a background of chronic thrombocytopenia from EtOH use. Improving Follow CBC  Acute metabolic encephalopathy Multifactorial-from sepsis/AKI/alcohol withdrawal (should be out of window by now) Essentially unchanged for the past several days-confused-follows simple commands. In restraints-but seems to be moving all 4 extremities Supportive care Delirium precautions  Oropharyngeal dysphagia In the setting of encephalopathy/debility/deconditioning NG tube feedings ongoing Goals of care discussions ongoing  Normocytic anemia Secondary to acute illness/AKI No evidence of GI bleeding Hb stable at 7.4 today-did require 1 unit of PRBC on 9/19. Follow CBC.  EtOH use Should be out of the window for any withdrawal symptoms No longer on benzos Supportive care-MVI/thiamine/folate  HTN BP stable All antihypertensives on hold  RUE  edema/pain Imaging/Dopplers negative Supportive care  Palliative care/goals of care DNR No further escalation in care-not a dialysis candidate  Some improvement in mentation and platelet count over the past several days  Palliative care following-for family meeting later today.    Nutrition Status: Nutrition Problem: Severe Malnutrition Etiology: chronic illness (chronic pancreatitis, liver disease) Signs/Symptoms: severe fat depletion, severe muscle depletion Interventions: Tube feeding, MVI, Refer to RD note for recommendations  Underweight: Estimated body mass index is 21.52 kg/m as calculated from the following:   Height as of this encounter: 5\' 3"  (1.6 m).   Weight as of this encounter: 55.1 kg.   Code status:   Code Status: Limited: Do not attempt resuscitation (DNR) -DNR-LIMITED -Do Not Intubate/DNI    DVT Prophylaxis: SCDs Start: 10/14/22 2019   Family Communication: Brother [phone] updated over the phone on 9/19   Disposition Plan: Status is: Inpatient Remains inpatient appropriate because: Severity of  illness   Planned Discharge Destination:Skilled nursing facility versus  hospice   Diet: Diet Order             Diet full liquid Room service appropriate? No; Fluid consistency: Thin  Diet effective now                     Antimicrobial agents: Anti-infectives (From admission, onward)    Start     Dose/Rate Route Frequency Ordered Stop   10/15/22 1000  cefTRIAXone (ROCEPHIN) 1 g in sodium chloride 0.9 % 100 mL IVPB  Status:  Discontinued        1 g 200 mL/hr over 30 Minutes Intravenous Every 24 hours 10/14/22 1746 10/15/22 0018   10/15/22 1000  cefTRIAXone (ROCEPHIN) 2 g in sodium chloride 0.9 % 100 mL IVPB        2 g 200 mL/hr over 30 Minutes Intravenous Every 24 hours 10/15/22 0018 10/29/22 0959   10/14/22 1000  ceFEPIme (MAXIPIME) 2 g in sodium chloride 0.9 % 100 mL IVPB        2 g 200 mL/hr over 30 Minutes Intravenous  Once 10/14/22 0946 10/14/22 1541   10/14/22 0945  aztreonam (AZACTAM) 2 g in sodium chloride 0.9 % 100 mL IVPB  Status:  Discontinued        2 g 200 mL/hr over 30 Minutes Intravenous  Once 10/14/22 0943 10/14/22 0946   10/14/22 0945  metroNIDAZOLE (FLAGYL) IVPB 500 mg        500 mg 100 mL/hr over 60 Minutes Intravenous  Once 10/14/22 0943 10/14/22 1309   10/14/22 0945  vancomycin (VANCOCIN) IVPB 1000 mg/200 mL premix        1,000 mg 200 mL/hr over 60 Minutes Intravenous  Once 10/14/22 0943 10/14/22 1643        MEDICATIONS: Scheduled Meds:  folic acid  1 mg Per Tube Daily   mirtazapine  7.5 mg Per Tube QHS   multivitamin with minerals  1 tablet Per Tube Daily   mouth rinse  15 mL Mouth Rinse 4 times per day   pantoprazole (PROTONIX) IV  40 mg Intravenous Daily   sodium chloride flush  10-40 mL Intracatheter Q12H   thiamine  100 mg Per Tube Daily   Continuous Infusions:  sodium chloride Stopped (10/23/22 1249)   sodium chloride 50 mL/hr at 10/24/22 1207   cefTRIAXone (ROCEPHIN)  IV 2 g (10/25/22 0842)   feeding supplement (OSMOLITE 1.2 CAL)  1,000 mL (10/24/22 1432)   PRN Meds:.sodium chloride, acetaminophen **OR** acetaminophen, fentaNYL (SUBLIMAZE) injection, guaiFENesin, hydrALAZINE, labetalol, mouth rinse, sodium chloride flush, traMADol, white petrolatum   I have personally reviewed following labs and imaging studies  LABORATORY DATA: CBC: Recent Labs  Lab 10/21/22 0326 10/22/22 0304 10/23/22 0400 10/24/22 0354 10/25/22 0408  WBC 26.7* 26.8* 25.5* 25.5* 26.3*  NEUTROABS 21.8* 21.9* 20.6* 20.4* 20.0*  HGB 8.5* 9.8* 7.5* 6.3* 7.4*  HCT 24.1* 28.4* 21.9* 18.5* 21.6*  MCV 87.0 87.9 92.8 93.4 86.7  PLT 20* 24* 30* 48* 64*    Basic Metabolic Panel: Recent Labs  Lab 10/19/22 0211 10/19/22 1739 10/20/22 0640 10/20/22 1735 10/21/22 0326 10/22/22 0304 10/23/22 0400  NA 141  --  143  --  142 143 145  K 3.5  --  2.9*  --  4.7 4.4 4.9  CL 98  --  104  --  106 103 106  CO2 27  --  28  --  26 28 27   GLUCOSE 121*  --  119*  --  118* 112* 110*  BUN 72*  --  75*  --  77* 84* 90*  CREATININE 3.61*  --  3.65*  --  3.62* 3.70* 3.78*  CALCIUM 8.4*  --  8.2*  --  8.4* 8.7* 8.5*  MG 1.7 1.6* 1.7 1.6* 1.6* 2.7* 2.4  PHOS 3.5 2.8 2.8 2.5  --   --  3.2    GFR: Estimated Creatinine Clearance: 12.9 mL/min (A) (by C-G formula based on SCr of 3.78 mg/dL (H)).  Liver Function Tests: Recent Labs  Lab 10/19/22 0211 10/20/22 0640 10/21/22 0326 10/22/22 0304 10/23/22 0400  AST 65* 33 46* 41 40  ALT 27 20 21 20 20   ALKPHOS 147* 123 180* 188* 163*  BILITOT 6.2* 4.9* 6.5* 6.7* 5.5*  PROT 5.5* 5.1* 5.3* 6.0* 5.5*  ALBUMIN 2.3* 1.8* 1.8* 1.9* 1.6*   No results for input(s): "LIPASE", "AMYLASE" in the last 168 hours. No results for input(s): "AMMONIA" in the last 168 hours.  Coagulation Profile: No results for input(s): "INR", "PROTIME" in the last 168 hours.   Cardiac Enzymes: No results for input(s): "CKTOTAL", "CKMB", "CKMBINDEX", "TROPONINI" in the last 168 hours.  BNP (last 3 results) No results for input(s):  "PROBNP" in the last 8760 hours.  Lipid Profile: No results for input(s): "CHOL", "HDL", "LDLCALC", "TRIG", "CHOLHDL", "LDLDIRECT" in the last 72 hours.  Thyroid Function Tests: No results for input(s): "TSH", "T4TOTAL", "FREET4", "T3FREE", "THYROIDAB" in the last 72 hours.  Anemia Panel: No results for input(s): "VITAMINB12", "FOLATE", "FERRITIN", "TIBC", "IRON", "RETICCTPCT" in the last 72 hours.  Urine analysis:  Component Value Date/Time   COLORURINE AMBER (A) 10/14/2022 1600   APPEARANCEUR TURBID (A) 10/14/2022 1600   LABSPEC 1.017 10/14/2022 1600   PHURINE 5.0 10/14/2022 1600   GLUCOSEU NEGATIVE 10/14/2022 1600   HGBUR SMALL (A) 10/14/2022 1600   BILIRUBINUR SMALL (A) 10/14/2022 1600   KETONESUR NEGATIVE 10/14/2022 1600   PROTEINUR 100 (A) 10/14/2022 1600   UROBILINOGEN 0.2 04/14/2014 1419   NITRITE NEGATIVE 10/14/2022 1600   LEUKOCYTESUR MODERATE (A) 10/14/2022 1600    Sepsis Labs: Lactic Acid, Venous    Component Value Date/Time   LATICACIDVEN 1.2 10/17/2022 2143    MICROBIOLOGY: Recent Results (from the past 240 hour(s))  C Difficile Quick Screen w PCR reflex     Status: None   Collection Time: 10/15/22  6:09 PM   Specimen: STOOL  Result Value Ref Range Status   C Diff antigen NEGATIVE NEGATIVE Final   C Diff toxin NEGATIVE NEGATIVE Final   C Diff interpretation No C. difficile detected.  Final    Comment: Performed at [facility], [address] [place], [place] [address]  Gastrointestinal Panel by PCR , Stool     Status: Abnormal   Collection Time: 10/15/22  6:09 PM   Specimen: Stool  Result Value Ref Range Status   Campylobacter species NOT DETECTED NOT DETECTED Final   Plesimonas shigelloides NOT DETECTED NOT DETECTED Final   Salmonella species NOT DETECTED NOT DETECTED Final   Yersinia enterocolitica NOT DETECTED NOT DETECTED Final   Vibrio species NOT DETECTED NOT DETECTED Final   Vibrio cholerae NOT DETECTED NOT DETECTED Final    Enteroaggregative E coli (EAEC) NOT DETECTED NOT DETECTED Final   Enterotoxigenic E coli (ETEC) NOT DETECTED NOT DETECTED Final   Shiga like toxin producing E coli (STEC) DETECTED (A) NOT DETECTED Final    Comment: RESULT CALLED TO, READ BACK BY AND VERIFIED WITH: ROSELOE ZARSONA 2212 10/16/22 MU    E. coli O157 NOT DETECTED NOT DETECTED Final   Shigella/Enteroinvasive E coli (EIEC) NOT DETECTED NOT DETECTED Final   Cryptosporidium NOT DETECTED NOT DETECTED Final   Cyclospora cayetanensis NOT DETECTED NOT DETECTED Final   Entamoeba histolytica NOT DETECTED NOT DETECTED Final   Giardia lamblia NOT DETECTED NOT DETECTED Final   Adenovirus F40/41 NOT DETECTED NOT DETECTED Final   Astrovirus NOT DETECTED NOT DETECTED Final   Norovirus GI/GII NOT DETECTED NOT DETECTED Final   Rotavirus A NOT DETECTED NOT DETECTED Final   Sapovirus (I, II, IV, and V) NOT DETECTED NOT DETECTED Final    Comment: Performed at [facility], [address] [place], [place] [address]  MRSA Next Gen by PCR, Nasal     Status: None   Collection Time: 10/17/22  9:30 PM   Specimen: Nasal Mucosa; Nasal Swab  Result Value Ref Range Status   MRSA by PCR Next Gen NOT DETECTED NOT DETECTED Final    Comment: (NOTE) The GeneXpert MRSA Assay (FDA approved for NASAL specimens only), is one component of a comprehensive MRSA colonization surveillance program. It is not intended to diagnose MRSA infection nor to guide or monitor treatment for MRSA infections. Test performance is not FDA approved in patients less than 56 years old. Performed at [facility], [address], [place], [place] [address]   Culture, blood (Routine X 2) w Reflex to ID Panel     Status: None   Collection Time: 10/20/22  2:36 PM   Specimen: BLOOD LEFT HAND  Result Value Ref Range  Status   Specimen Description BLOOD LEFT HAND  Final   Special Requests   Final    BOTTLES DRAWN AEROBIC AND ANAEROBIC Blood Culture results may not be  optimal due to an inadequate volume of blood received in culture bottles   Culture   Final    NO GROWTH 5 DAYS Performed at [facility], [address] [place], [place] [address]    Report Status 10/25/2022 FINAL  Final  Culture, blood (Routine X 2) w Reflex to ID Panel     Status: None   Collection Time: 10/20/22  2:36 PM   Specimen: BLOOD LEFT HAND  Result Value Ref Range Status   Specimen Description BLOOD LEFT HAND  Final   Special Requests   Final    BOTTLES DRAWN AEROBIC AND ANAEROBIC Blood Culture results may not be optimal due to an inadequate volume of blood received in culture bottles   Culture   Final    NO GROWTH 5 DAYS Performed at [facility], [address] [place], [place] [address]    Report Status 10/25/2022 FINAL  Final    RADIOLOGY STUDIES/RESULTS: DG Chest Port 1V same Day  Result Date: 10/24/2022 CLINICAL DATA:  141880 SOB (shortness of breath) 141880 EXAM: PORTABLE CHEST 1 VIEW COMPARISON:  CXR 10/17/22 FINDINGS: No pleural effusion. No pneumothorax. Normal cardiac and mediastinal contours. Hazy opacity in the right lung base could represent atelectasis or infection. Unchanged prominent bilateral interstitial opacities. Visualized upper is unremarkable. Weighted enteric tube courses below diaphragm with tip out of the field of view. IMPRESSION: 1. Hazy opacity in the right lung base could represent atelectasis or infection. 2. Unchanged prominent bilateral interstitial opacities, which could represent pulmonary edema or atypical infection. Electronically Signed   By: Lorenza Cambridge M.D.   On: 10/24/2022 12:28     LOS: 11 days   Jeoffrey Massed, MD  [facility]    To contact the attending provider between 7A-7P or the covering provider during after hours 7P-7A, please log into the web site [email or web address] and access using universal [place] password for that web site. If you do not have the password, please call the hospital  operator.  10/25/2022, 11:14 AM

## 2022-10-25 NOTE — Progress Notes (Signed)
Daily Progress Note   Patient Name: Brandy Bishop       Date: 10/25/2022 DOB: [birth year]  Age: 62 y.o. MRN#: [id number] Attending Physician: Maretta Bees, MD Primary Care Physician: Cline Crock, NP Admit Date: 10/14/2022  Reason for Consultation/Follow-up: Establishing goals of care  Length of Stay: 11  Current Medications: Scheduled Meds:   folic acid  1 mg Per Tube Daily   mirtazapine  7.5 mg Per Tube QHS   multivitamin with minerals  1 tablet Per Tube Daily   mouth rinse  15 mL Mouth Rinse 4 times per day   pantoprazole (PROTONIX) IV  40 mg Intravenous Daily   sodium chloride flush  10-40 mL Intracatheter Q12H   thiamine  100 mg Per Tube Daily    Continuous Infusions:  sodium chloride Stopped (10/23/22 1249)   sodium chloride 50 mL/hr at 10/24/22 1207   cefTRIAXone (ROCEPHIN)  IV 2 g (10/25/22 0842)   feeding supplement (OSMOLITE 1.2 CAL) 1,000 mL (10/24/22 1432)    PRN Meds: sodium chloride, acetaminophen **OR** acetaminophen, fentaNYL (SUBLIMAZE) injection, guaiFENesin, hydrALAZINE, labetalol, mouth rinse, sodium chloride flush, traMADol, white petrolatum  Physical Exam Vitals reviewed.  Constitutional:      General: She is sleeping.     Appearance: She is ill-appearing.     Comments: Cortrak in place, wrist restraints  HENT:     Head: Normocephalic and atraumatic.  Cardiovascular:     Rate and Rhythm: Tachycardia present.  Pulmonary:     Effort: Tachypnea present.  Skin:    General: Skin is warm and dry.             Vital Signs: BP 122/69 (BP Location: Right Arm)   Pulse (!) 115   Temp 100 F (37.8 C) (Oral)   Resp (!) 33   Ht 5\' 3"  (1.6 m)   Wt 55.1 kg   SpO2 99%   BMI 21.52 kg/m  SpO2: SpO2: 99 % O2 Device: O2 Device: Room Air O2 Flow Rate: O2 Flow Rate (L/min): 2 L/min    Patient Active  Problem List   Diagnosis Date Noted   Sepsis due to Escherichia coli with encephalopathy without septic shock (HCC) 10/17/2022   Acute metabolic encephalopathy 10/17/2022   Severe sepsis (HCC) 10/14/2022   Hypovolemia 10/14/2022   Dehydration 10/14/2022   AKI (acute kidney injury) (HCC) 10/14/2022   CAP (community acquired pneumonia) 06/30/2022   Colon cancer screening 08/10/2020   Acute liver failure 06/08/2020   Acute GI bleeding 09/08/2019   Polysubstance abuse (HCC) 09/08/2019   Gastroesophageal reflux disease with esophagitis    Seizure (HCC) 05/30/2015   Cough    Subtherapeutic phenytoin level    Acute upper GI bleed    Hypomagnesemia 04/16/2014   Hypokalemia 04/16/2014   Thrombocytopenia (HCC) 04/16/2014   Liver disease 04/15/2014   ETOH abuse 04/15/2014   Chronic pancreatitis (HCC) 04/15/2014   Protein-calorie malnutrition, severe (HCC) 04/15/2014   Severe protein-calorie malnutrition (HCC) 04/15/2014   Bleeding gastrointestinal    Abnormal CT scan, colon    Rectal bleeding    Abdominal pain    Alcoholic hepatitis without ascites    GI bleed 04/14/2014   Viral URI with cough 05/07/2012  Pyelonephritis 05/07/2012   Seizure disorder (HCC) 05/07/2012   Asthma with bronchitis 05/07/2012   HTN (hypertension) 05/07/2012    Palliative Care Assessment & Plan   Patient Profile: 62 y.o. female  with past medical history of ETOH use, asthma, chronic back pain, colitis, CIB, pancreatitis, and seizures admitted on 10/14/2022 with poor appetite and weakness. Patient found to have E coli bacteremia; STEC gastroenteritis. Severe AKI, no need for HD at this point. Has received some benzos during hospitalizations for withdrawal symptoms. PMT consulted for GOC discussions.    Assessment: Patient was awake in bed with soft wrist restraints. She spoke in some slurred/ mumbled sentences which family understood. Family asked for another meeting with PMT. Several family members present  including brother Renae Gloss who is main contact person for the family.  Gave family updates including continued acute renal failure, continued increased WBC count, continued altered mental status, and nutritional status. They share that it is very difficult for them with her "ups and downs." Her ability to communicate with them at times feels encouraging to them. We discussed her overall poor prognosis and that her other concerns have remained stable. The family understands the patient is still very acutely ill.   We discussed her use of cortrak and we discussed whether she would want a permanent feeding tube if it were offered. Most of the family feels that she would not want a feeding tube and we discussed risks including aspiration, increased diarrhea, and her pulling it out. The family will discuss with other family members.  We discussed what a comfort path might look like for her if they decide to transition to comfort at any point. They are very familiar with hospice. Answered questions and provided emotional support.  They continue to hope and pray for her recovery and want time for further outcomes. PMT will continue to support. Family encouraged to call with questions or concerns.  Recommendations/Plan: DNR Do not intubate, do not start vasopressors, do not initiate dialysis Treat the treatable Time for outcomes Patient's brother Thersa Salt [phone] will remain the point of contact for the family. Spiritual care consult Continued PMT support   Code Status:    Code Status Orders  (From admission, onward)           Start     Ordered   10/19/22 1308  Do not attempt resuscitation (DNR)- Limited -Do Not Intubate (DNI)  (Code Status)  Continuous       Question Answer Comment  If pulseless and not breathing No CPR or chest compressions.   In Pre-Arrest Conditions (Patient Is Breathing and Has A Pulse) Do not intubate. Provide all appropriate non-invasive medical  interventions. Avoid ICU transfer unless indicated or required.   Consent: Discussion documented in EHR or advanced directives reviewed      10/19/22 1307             Care plan was discussed with bedside RN and Dr. Jerral Ralph  Time spent: 60 minutes  Thank you for allowing the Palliative Medicine Team to assist in the care of this patient.     Sherryll Burger, NP  Please contact Palliative Medicine Team phone at [phone] for questions and concerns.

## 2022-10-25 NOTE — Progress Notes (Signed)
[facility] NEPHROLOGY PROGRESS NOTE  Assessment/ Plan: Pt is a 62 y.o. yo female with medical history of alcohol abuse, liver cirrhosis, thrombocytopenia, hypertension, MDD who was admitted on 9/9 with seizure, sepsis due to E. coli bacteremia and AKI.  # Sepsis secondary due to Shiga like toxin E. coli bacteremia: Currently on ceftriaxone and supportive treatment.  In ICU.  # Nonoliguric AKI likely ischemic ATN in the setting of sepsis/ongoing GI loss, poor oral intake with intravascular depletion.  Concern for HUS given STEC diagnosis. S/p one dose of eculizumab on 9/14. Cr is normal at baseline -Continue to hold diuretics, would give PRN. Labs pending (no met panel since 9/18) -Deemed not a candidate for dialysis given underlying comorbidities per previous eval.  Continue current supportive care.  Palliative care following  # Shiga like toxin associated with E. coli bacteremia: Concern for HUS.  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 Receiving ceftriaxone.  Meningococcal vaccination before the infusion.  # Alcohol abuse, liver cirrhosis, protein calorie malnutrition/seizure: Currently on thiamine, s/p CIWA protocol in ICU.  Remains encephalopathic.  # Severe anemia, thrombocytopenia: PRBC/plt transfusion per primary service.  Monitor lab.  # Hypokalemia: Replete potassium chloride and Mag PRN  Overall poor prognosis and recommend hospice and comfort care. Palliative care following. Now DNR Family does not want pressors or dialysis.  Discussed with primary service.  Subjective: Seen and examined bedside. No new events. Remains confused. Continues to have intermittent fevers, temp 101 earlier this am. Uop charted 400cc with 5 unmeasured voids. Labs pending  Objective Vital signs in last 24 hours: Vitals:   10/25/22 0630 10/25/22 0700 10/25/22 0800 10/25/22 1200  BP:  122/69 133/69 (!) 158/90  Pulse: (!) 115  (!) 113 (!) 111   Resp: 17 (!) 33 (!) 24 16  Temp:  99.8 F (37.7 C) 100 F (37.8 C) 99.1 F (37.3 C)  TempSrc:  Oral Oral Oral  SpO2: 99%  99% 100%  Weight:      Height:       Weight change:   Intake/Output Summary (Last 24 hours) at 10/25/2022 1301 Last data filed at 10/25/2022 0820 Gross per 24 hour  Intake 1941.17 ml  Output 400 ml  Net 1541.17 ml       Labs: RENAL PANEL Recent Labs  Lab 10/19/22 0211 10/19/22 1739 10/20/22 0640 10/20/22 1735 10/21/22 0326 10/22/22 0304 10/23/22 0400  NA 141  --  143  --  142 143 145  K 3.5  --  2.9*  --  4.7 4.4 4.9  CL 98  --  104  --  106 103 106  CO2 27  --  28  --  26 28 27   GLUCOSE 121*  --  119*  --  118* 112* 110*  BUN 72*  --  75*  --  77* 84* 90*  CREATININE 3.61*  --  3.65*  --  3.62* 3.70* 3.78*  CALCIUM 8.4*  --  8.2*  --  8.4* 8.7* 8.5*  MG 1.7 1.6* 1.7 1.6* 1.6* 2.7* 2.4  PHOS 3.5 2.8 2.8 2.5  --   --  3.2  ALBUMIN 2.3*  --  1.8*  --  1.8* 1.9* 1.6*    Liver Function Tests: Recent Labs  Lab 10/21/22 0326 10/22/22 0304 10/23/22 0400  AST 46* 41 40  ALT 21 20 20   ALKPHOS 180* 188* 163*  BILITOT 6.5* 6.7* 5.5*  PROT 5.3* 6.0* 5.5*  ALBUMIN  1.8* 1.9* 1.6*   No results for input(s): "LIPASE", "AMYLASE" in the last 168 hours. No results for input(s): "AMMONIA" in the last 168 hours. CBC: Recent Labs    10/15/22 0540 10/15/22 2035 10/16/22 0600 10/21/22 0326 10/22/22 0304 10/23/22 0400 10/24/22 0354 10/25/22 0408  HGB 8.0* 7.2*   < > 8.5* 9.8* 7.5* 6.3* 7.4*  MCV 91.6 90.6   < > 87.0 87.9 92.8 93.4 86.7  VITAMINB12 894  --   --   --   --   --   --   --   FOLATE 13.5  --   --   --   --   --   --   --   FERRITIN 590*  --   --   --   --   --   --   --   TIBC 126*  --   --   --   --   --   --   --   IRON 6*  --   --   --   --   --   --   --   RETICCTPCT  --  0.6  --   --   --   --   --   --    < > = values in this interval not displayed.    Cardiac Enzymes: No results for input(s): "CKTOTAL", "CKMB",  "CKMBINDEX", "TROPONINI" in the last 168 hours.  CBG: Recent Labs  Lab 10/24/22 2154 10/24/22 2355 10/25/22 0414 10/25/22 0857 10/25/22 1218  GLUCAP 88 94 98 92 93    Iron Studies: No results for input(s): "IRON", "TIBC", "TRANSFERRIN", "FERRITIN" in the last 72 hours. Studies/Results: DG Chest Port 1V same Day  Result Date: 10/24/2022 CLINICAL DATA:  141880 SOB (shortness of breath) [id number] EXAM: PORTABLE CHEST 1 VIEW COMPARISON:  CXR 10/17/22 FINDINGS: No pleural effusion. No pneumothorax. Normal cardiac and mediastinal contours. Hazy opacity in the right lung base could represent atelectasis or infection. Unchanged prominent bilateral interstitial opacities. Visualized upper is unremarkable. Weighted enteric tube courses below diaphragm with tip out of the field of view. IMPRESSION: 1. Hazy opacity in the right lung base could represent atelectasis or infection. 2. Unchanged prominent bilateral interstitial opacities, which could represent pulmonary edema or atypical infection. Electronically Signed   By: Lorenza Cambridge M.D.   On: 10/24/2022 12:28    Medications: Infusions:  sodium chloride Stopped (10/23/22 1249)   cefTRIAXone (ROCEPHIN)  IV 2 g (10/25/22 0842)   feeding supplement (OSMOLITE 1.2 CAL) 1,000 mL (10/24/22 1432)   metronidazole 500 mg (10/25/22 1158)    Scheduled Medications:  folic acid  1 mg Per Tube Daily   mirtazapine  7.5 mg Per Tube QHS   multivitamin with minerals  1 tablet Per Tube Daily   mouth rinse  15 mL Mouth Rinse 4 times per day   pantoprazole (PROTONIX) IV  40 mg Intravenous Daily   sodium chloride flush  10-40 mL Intracatheter Q12H   thiamine  100 mg Per Tube Daily    have reviewed scheduled and prn medications.  Physical Exam: General: chronically ill looking female, awake, NAD HEENT: dry MM Heart: tachycardic, regular rhythm Lungs: CTA b/l Abdomen:soft, Non-tender, slightly distended Extremities:No edema Neurology: awake  Brandy Bishop 10/25/2022,1:01 PM  LOS: 11 days

## 2022-10-26 DIAGNOSIS — Z7189 Other specified counseling: Secondary | ICD-10-CM

## 2022-10-26 DIAGNOSIS — Z515 Encounter for palliative care: Secondary | ICD-10-CM | POA: Diagnosis not present

## 2022-10-26 DIAGNOSIS — R6520 Severe sepsis without septic shock: Secondary | ICD-10-CM | POA: Diagnosis not present

## 2022-10-26 DIAGNOSIS — A419 Sepsis, unspecified organism: Secondary | ICD-10-CM | POA: Diagnosis not present

## 2022-10-26 LAB — CBC WITH DIFFERENTIAL/PLATELET
Abs Immature Granulocytes: 0 10*3/uL (ref 0.00–0.07)
Basophils Absolute: 0 10*3/uL (ref 0.0–0.1)
Basophils Relative: 0 %
Eosinophils Absolute: 0.2 10*3/uL (ref 0.0–0.5)
Eosinophils Relative: 1 %
HCT: 19.5 % — ABNORMAL LOW (ref 36.0–46.0)
Hemoglobin: 6.5 g/dL — CL (ref 12.0–15.0)
Lymphocytes Relative: 8 %
Lymphs Abs: 1.8 10*3/uL (ref 0.7–4.0)
MCH: 30 pg (ref 26.0–34.0)
MCHC: 33.3 g/dL (ref 30.0–36.0)
MCV: 89.9 fL (ref 80.0–100.0)
Monocytes Absolute: 0.7 10*3/uL (ref 0.1–1.0)
Monocytes Relative: 3 %
Neutro Abs: 19.4 10*3/uL — ABNORMAL HIGH (ref 1.7–7.7)
Neutrophils Relative %: 88 %
Platelets: 74 10*3/uL — ABNORMAL LOW (ref 150–400)
RBC: 2.17 MIL/uL — ABNORMAL LOW (ref 3.87–5.11)
RDW: 20.7 % — ABNORMAL HIGH (ref 11.5–15.5)
WBC: 22 10*3/uL — ABNORMAL HIGH (ref 4.0–10.5)
nRBC: 0 % (ref 0.0–0.2)
nRBC: 0 /100 WBC

## 2022-10-26 LAB — GLUCOSE, CAPILLARY
Glucose-Capillary: 102 mg/dL — ABNORMAL HIGH (ref 70–99)
Glucose-Capillary: 95 mg/dL (ref 70–99)
Glucose-Capillary: 97 mg/dL (ref 70–99)

## 2022-10-26 LAB — RENAL FUNCTION PANEL
Albumin: <1.5 g/dL — ABNORMAL LOW (ref 3.5–5.0)
Anion gap: 7 (ref 5–15)
BUN: 89 mg/dL — ABNORMAL HIGH (ref 8–23)
CO2: 21 mmol/L — ABNORMAL LOW (ref 22–32)
Calcium: 7.8 mg/dL — ABNORMAL LOW (ref 8.9–10.3)
Chloride: 112 mmol/L — ABNORMAL HIGH (ref 98–111)
Creatinine, Ser: 3.79 mg/dL — ABNORMAL HIGH (ref 0.44–1.00)
GFR, Estimated: 13 mL/min — ABNORMAL LOW (ref >60)
Glucose, Bld: 107 mg/dL — ABNORMAL HIGH (ref 70–99)
Phosphorus: 5.6 mg/dL — ABNORMAL HIGH (ref 2.5–4.6)
Potassium: 4.7 mmol/L (ref 3.5–5.1)
Sodium: 140 mmol/L (ref 135–145)

## 2022-10-26 LAB — PREPARE RBC (CROSSMATCH)

## 2022-10-26 MED ORDER — HALOPERIDOL LACTATE 5 MG/ML IJ SOLN: 2.0000 mg | INTRAMUSCULAR | Status: DC | PRN

## 2022-10-26 MED ORDER — SODIUM CHLORIDE 0.9% IV SOLUTION: Freq: Once | INTRAVENOUS | Status: AC

## 2022-10-26 MED ORDER — HALOPERIDOL LACTATE 2 MG/ML PO CONC: 2.0000 mg | Freq: Four times a day (QID) | ORAL | Status: DC | PRN

## 2022-10-26 MED ORDER — ACETAMINOPHEN 325 MG PO TABS: 650.0000 mg | ORAL_TABLET | Freq: Four times a day (QID) | ORAL | Status: DC | PRN

## 2022-10-26 MED ORDER — GLYCOPYRROLATE 0.2 MG/ML IJ SOLN: 0.2000 mg | INTRAMUSCULAR | Status: DC | PRN

## 2022-10-26 MED ORDER — GLYCOPYRROLATE 1 MG PO TABS: 1.0000 mg | ORAL_TABLET | ORAL | Status: DC | PRN

## 2022-10-26 MED ORDER — ACETAMINOPHEN 650 MG RE SUPP: 650.0000 mg | Freq: Four times a day (QID) | RECTAL | Status: DC | PRN

## 2022-10-26 MED ORDER — QUETIAPINE FUMARATE 25 MG PO TABS: 12.5000 mg | ORAL_TABLET | Freq: Two times a day (BID) | ORAL | Status: DC

## 2022-10-26 MED ORDER — POLYVINYL ALCOHOL 1.4 % OP SOLN: 1.0000 [drp] | Freq: Four times a day (QID) | OPHTHALMIC | Status: DC | PRN

## 2022-10-26 MED ORDER — LORAZEPAM 2 MG/ML IJ SOLN
1.0000 mg | INTRAMUSCULAR | Status: DC | PRN
  Administered 2022-10-26 – 2022-10-27 (×5): 1 mg via INTRAVENOUS
  Filled 2022-10-26 (×5): qty 1

## 2022-10-26 MED ORDER — ONDANSETRON 4 MG PO TBDP: 4.0000 mg | ORAL_TABLET | Freq: Four times a day (QID) | ORAL | Status: DC | PRN

## 2022-10-26 MED ORDER — QUETIAPINE FUMARATE 25 MG PO TABS
12.5000 mg | ORAL_TABLET | Freq: Once | ORAL | Status: AC
  Administered 2022-10-26: 12.5 mg
  Filled 2022-10-26: qty 1

## 2022-10-26 MED ORDER — LORAZEPAM 2 MG/ML PO CONC: 1.0000 mg | ORAL | Status: DC | PRN

## 2022-10-26 MED ORDER — ONDANSETRON HCL 4 MG/2ML IJ SOLN: 4.0000 mg | Freq: Four times a day (QID) | INTRAMUSCULAR | Status: DC | PRN

## 2022-10-26 MED ORDER — HALOPERIDOL 1 MG PO TABS: 2.0000 mg | ORAL_TABLET | Freq: Four times a day (QID) | ORAL | Status: DC | PRN

## 2022-10-26 MED ORDER — HYDROMORPHONE HCL 1 MG/ML IJ SOLN
1.0000 mg | INTRAMUSCULAR | Status: DC | PRN
  Administered 2022-10-26 – 2022-10-27 (×5): 1 mg via INTRAVENOUS
  Filled 2022-10-26 (×5): qty 1

## 2022-10-26 MED ORDER — BIOTENE DRY MOUTH MT LIQD
15.0000 mL | Freq: Two times a day (BID) | OROMUCOSAL | Status: DC
  Administered 2022-10-26 – 2022-10-27 (×3): 15 mL via TOPICAL

## 2022-10-26 MED ORDER — DIPHENHYDRAMINE HCL 50 MG/ML IJ SOLN: 25.0000 mg | INTRAMUSCULAR | Status: DC | PRN

## 2022-10-26 MED ORDER — LORAZEPAM 2 MG/ML IJ SOLN
1.0000 mg | INTRAMUSCULAR | Status: AC
  Administered 2022-10-26: 1 mg via INTRAVENOUS
  Filled 2022-10-26: qty 1

## 2022-10-26 MED ORDER — LORAZEPAM 1 MG PO TABS: 1.0000 mg | ORAL_TABLET | ORAL | Status: DC | PRN

## 2022-10-26 NOTE — Plan of Care (Signed)
Problem: Education: Goal: Knowledge of General Education information will improve Description Including pain rating scale, medication(s)/side effects and non-pharmacologic comfort measures Outcome: Progressing   Problem: Clinical Measurements: Goal: Ability to maintain clinical measurements within normal limits will improve Outcome: Progressing Goal: Will remain free from infection Outcome: Progressing   Problem: Safety: Goal: Ability to remain free from injury will improve Outcome: Progressing   Problem: Skin Integrity: Goal: Risk for impaired skin integrity will decrease Outcome: Progressing

## 2022-10-26 NOTE — Progress Notes (Addendum)
PROGRESS NOTE        PATIENT DETAILS Name: Brandy Bishop Age: 62 y.o. Sex: female Date of Birth: 1960-08-13 Admit Date: 10/14/2022 Admitting Physician A Grier Mitts., MD [id number], Maxine Glenn, NP  Brief Summary: Patient is a 62 y.o.  female with history of EtOH use, HTN, asthma-who presented with confusion/body aches/diarrhea-she was initially thought to have sepsis physiology due to pyelonephritis-however her stool studies was positive for Shiga like toxin E. coli-subsequent blood cultures came back positive for E. coli as well.   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  Unfortunately-she continued to deteriorate-after discussion with palliative care she was made a DNR.  She was not considered a dialysis candidate.  See below for further details.  Significant events: 9/9>> admission to [facility] 9/11>> CCM consult for increased work of breathing/pulm edema. 9/12>>stool test positive for STEC, STEC-HUS being considered 9/14>>: Oncology consulted, given 1 dose of eculizumab, made DNR after discussion with palliative care 9/17: Transferred to the hospitalist service  Significant studies: 9/9>> CXR: No active disease 9/9>> RUQ ultrasound: No bowel stones. 9/9>> CT abdomen: No acute intracranial abnormality 9/9>> CT abdomen/pelvis: Bile lateral perinephric stranding, moderate air/fluid distention of the colon/rectum. 9/11>> CXR: Interval development of pulm edema 9/16>> bilateral upper extremity Doppler: No DVT  Significant microbiology data: 9/09>> COVID/influenza PCR: Negative 9/09>> blood culture: E. Coli 9/10>> GI pathogen panel: Shiga like toxin producing E. coli. 9/10>> C. difficile: Negative 9/15>> blood culture: No growth  Procedures: 9/13>>Cortrak tube  Consults: PCCM Nephrology Palliative care Oncology  Subjective: Discussed with night  RN-no oral intake-restless/agitated-in restraints.  Febrile again last night.  Objective: Vitals: Blood pressure (!) 144/92, pulse (!) 121, temperature 97.9 F (36.6 C), temperature source Oral, resp. rate 16, height 5\' 3"  (1.6 m), weight 58.1 kg, SpO2 98%.   Exam: Gen Exam: Confused-in restraints. HEENT:atraumatic, normocephalic Chest: B/L clear to auscultation anteriorly CVS:S1S2 regular Abdomen: Soft-mildly distended but not tense-nontender. Extremities:no edema Neurology: Non focal Skin: no rash  Pertinent Labs/Radiology:    Latest Ref Rng & Units 10/26/2022    3:41 AM 10/25/2022    4:08 AM 10/24/2022    3:54 AM  CBC  WBC 4.0 - 10.5 K/uL 22.0  26.3  25.5   Hemoglobin 12.0 - 15.0 g/dL 6.5  C 7.4  6.3   Hematocrit 36.0 - 46.0 % 19.5  21.6  18.5   Platelets 150 - 400 K/uL 74  64  48     C Corrected result    Lab Results  Component Value Date   NA 140 10/26/2022   K 4.7 10/26/2022   CL 112 (H) 10/26/2022   CO2 21 (L) 10/26/2022      Assessment/Plan: Severe sepsis due to E. coli bacteremia in the setting of gastroenteritis due to Shigella producing E. Coli Although sepsis physiology overall better-she has been febrile for the past 3 days  Concern for aspiration-on Rocephin-Flagyl was added yesterday  Although blood cultures ordered-has not been done for the past several days-I have discussed with nurse/charge nurse Very poor overall prognosis-family meeting again at 11 AM today to continue goals of care discussion.  Appropriate to transition to full comfort measures.    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    AKI Suspicion for ATN in the setting of sepsis versus HUS syndrome Nonoliguric Nephrology  following-however not a candidate for HD Avoid nephrotoxic agents and follow  Thrombocytopenia Either from HUS syndrome physiology or due to severe sepsis-in a background of chronic thrombocytopenia from EtOH use. Improving Follow CBC  Acute metabolic encephalopathy Multifactorial-from sepsis/AKI/alcohol withdrawal (should be out of window by now) Essentially unchanged for the past several days-confused-follows simple commands. In restraints-but seems to be moving all 4 extremities Supportive care Delirium precautions  Oropharyngeal dysphagia In the setting of encephalopathy/debility/deconditioning NG tube feedings ongoing Goals of care discussions ongoing  Normocytic anemia Secondary to acute illness/AKI No evidence of GI bleeding Hemoglobin back down to 6.5-will require another unit of PRBC (last transfusion on 9/19) Follow CBC.  EtOH use Should be out of the window for any withdrawal symptoms No longer on benzos Supportive care-MVI/thiamine/folate  HTN BP stable All antihypertensives on hold  RUE  edema/pain Imaging/Dopplers negative Supportive care  Palliative care/goals of care DNR No further escalation in care-not a dialysis candidate  Although some improvement in platelet count for the past several days-now spiking fevers-remains very encephalopathic-no oral intake-continues to have significant acute kidney injury. Family meeting yesterday-family still wanting to wait and see-another family meeting scheduled for 11 AM today.  Nutrition Status: Nutrition Problem: Severe Malnutrition Etiology: chronic illness (chronic pancreatitis, liver disease) Signs/Symptoms: severe fat depletion, severe muscle depletion Interventions: Tube feeding, MVI, Refer to RD note for recommendations  Underweight: Estimated body mass index is 22.69 kg/m as calculated from the following:   Height as of this encounter: 5\' 3"  (1.6 m).   Weight as of this encounter: 58.1 kg.   Code  status:   Code Status: Limited: Do not attempt resuscitation (DNR) -DNR-LIMITED -Do Not Intubate/DNI    DVT Prophylaxis: SCDs Start: 10/14/22 2019   Family Communication: Brother Shelton-[phone] updated over the phone on 9/21   Disposition Plan: Status is: Inpatient Remains inpatient appropriate because: Severity of illness   Planned Discharge Destination:Skilled nursing facility versus  hospice   Diet: Diet Order             Diet full liquid Room service appropriate? No; Fluid consistency: Thin  Diet effective now                     Antimicrobial agents: Anti-infectives (From admission, onward)    Start     Dose/Rate Route Frequency Ordered Stop   10/25/22 1200  metroNIDAZOLE (FLAGYL) IVPB 500 mg        500 mg 100 mL/hr over 60 Minutes Intravenous Every 12 hours 10/25/22 1127 10/28/22 1159   10/15/22 1000  cefTRIAXone (ROCEPHIN) 1 g in sodium chloride 0.9 % 100 mL IVPB  Status:  Discontinued        1 g 200 mL/hr over 30 Minutes Intravenous Every 24 hours 10/14/22 1746 10/15/22 0018   10/15/22 1000  cefTRIAXone (ROCEPHIN) 2 g in sodium chloride 0.9 % 100 mL IVPB        2 g 200 mL/hr over 30 Minutes Intravenous Every 24 hours 10/15/22 0018 10/29/22 0959   10/14/22 1000  ceFEPIme (MAXIPIME) 2 g in sodium chloride 0.9 % 100 mL IVPB        2 g 200 mL/hr over 30 Minutes Intravenous  Once 10/14/22 0946 10/14/22 1541  10/14/22 0945  aztreonam (AZACTAM) 2 g in sodium chloride 0.9 % 100 mL IVPB  Status:  Discontinued        2 g 200 mL/hr over 30 Minutes Intravenous  Once 10/14/22 0943 10/14/22 0946   10/14/22 0945  metroNIDAZOLE (FLAGYL) IVPB 500 mg        500 mg 100 mL/hr over 60 Minutes Intravenous  Once 10/14/22 0943 10/14/22 1309   10/14/22 0945  vancomycin (VANCOCIN) IVPB 1000 mg/200 mL premix        1,000 mg 200 mL/hr over 60 Minutes Intravenous  Once 10/14/22 0943 10/14/22 1643        MEDICATIONS: Scheduled Meds:  folic acid  1 mg Per Tube Daily    mirtazapine  7.5 mg Per Tube QHS   multivitamin with minerals  1 tablet Per Tube Daily   mouth rinse  15 mL Mouth Rinse 4 times per day   pantoprazole (PROTONIX) IV  40 mg Intravenous Daily   sodium chloride flush  10-40 mL Intracatheter Q12H   thiamine  100 mg Per Tube Daily   Continuous Infusions:  sodium chloride Stopped (10/23/22 1249)   cefTRIAXone (ROCEPHIN)  IV 2 g (10/25/22 0842)   feeding supplement (OSMOLITE 1.2 CAL) 55 mL/hr at 10/26/22 0515   metronidazole 500 mg (10/25/22 2340)   PRN Meds:.sodium chloride, acetaminophen **OR** acetaminophen, fentaNYL (SUBLIMAZE) injection, guaiFENesin, hydrALAZINE, labetalol, mouth rinse, sodium chloride flush, traMADol, white petrolatum   I have personally reviewed following labs and imaging studies  LABORATORY DATA: CBC: Recent Labs  Lab 10/22/22 0304 10/23/22 0400 10/24/22 0354 10/25/22 0408 10/26/22 0341  WBC 26.8* 25.5* 25.5* 26.3* 22.0*  NEUTROABS 21.9* 20.6* 20.4* 20.0* 19.4*  HGB 9.8* 7.5* 6.3* 7.4* 6.5*  HCT 28.4* 21.9* 18.5* 21.6* 19.5*  MCV 87.9 92.8 93.4 86.7 89.9  PLT 24* 30* 48* 64* 74*    Basic Metabolic Panel: Recent Labs  Lab 10/19/22 1739 10/20/22 0640 10/20/22 1735 10/21/22 0326 10/22/22 0304 10/23/22 0400 10/26/22 0341  NA  --  143  --  142 143 145 140  K  --  2.9*  --  4.7 4.4 4.9 4.7  CL  --  104  --  106 103 106 112*  CO2  --  28  --  26 28 27  21*  GLUCOSE  --  119*  --  118* 112* 110* 107*  BUN  --  75*  --  77* 84* 90* 89*  CREATININE  --  3.65*  --  3.62* 3.70* 3.78* 3.79*  CALCIUM  --  8.2*  --  8.4* 8.7* 8.5* 7.8*  MG 1.6* 1.7 1.6* 1.6* 2.7* 2.4  --   PHOS 2.8 2.8 2.5  --   --  3.2 5.6*    GFR: Estimated Creatinine Clearance: 12.9 mL/min (A) (by C-G formula based on SCr of 3.79 mg/dL (H)).  Liver Function Tests: Recent Labs  Lab 10/20/22 0640 10/21/22 0326 10/22/22 0304 10/23/22 0400 10/26/22 0341  AST 33 46* 41 40  --   ALT 20 21 20 20   --   ALKPHOS 123 180* 188* 163*  --    BILITOT 4.9* 6.5* 6.7* 5.5*  --   PROT 5.1* 5.3* 6.0* 5.5*  --   ALBUMIN 1.8* 1.8* 1.9* 1.6* <1.5*   No results for input(s): "LIPASE", "AMYLASE" in the last 168 hours. No results for input(s): "AMMONIA" in the last 168 hours.  Coagulation Profile: No results for input(s): "INR", "PROTIME" in the last 168 hours.  Cardiac Enzymes: No results for input(s): "CKTOTAL", "CKMB", "CKMBINDEX", "TROPONINI" in the last 168 hours.  BNP (last 3 results) No results for input(s): "PROBNP" in the last 8760 hours.  Lipid Profile: No results for input(s): "CHOL", "HDL", "LDLCALC", "TRIG", "CHOLHDL", "LDLDIRECT" in the last 72 hours.  Thyroid Function Tests: No results for input(s): "TSH", "T4TOTAL", "FREET4", "T3FREE", "THYROIDAB" in the last 72 hours.  Anemia Panel: No results for input(s): "VITAMINB12", "FOLATE", "FERRITIN", "TIBC", "IRON", "RETICCTPCT" in the last 72 hours.  Urine analysis:    Component Value Date/Time   COLORURINE AMBER (A) 10/14/2022 1600   APPEARANCEUR TURBID (A) 10/14/2022 1600   LABSPEC 1.017 10/14/2022 1600   PHURINE 5.0 10/14/2022 1600   GLUCOSEU NEGATIVE 10/14/2022 1600   HGBUR SMALL (A) 10/14/2022 1600   BILIRUBINUR SMALL (A) 10/14/2022 1600   KETONESUR NEGATIVE 10/14/2022 1600   PROTEINUR 100 (A) 10/14/2022 1600   UROBILINOGEN 0.2 04/14/2014 1419   NITRITE NEGATIVE 10/14/2022 1600   LEUKOCYTESUR MODERATE (A) 10/14/2022 1600    Sepsis Labs: Lactic Acid, Venous    Component Value Date/Time   LATICACIDVEN 1.2 10/17/2022 2143    MICROBIOLOGY: Recent Results (from the past 240 hour(s))  MRSA Next Gen by PCR, Nasal     Status: None   Collection Time: 10/17/22  9:30 PM   Specimen: Nasal Mucosa; Nasal Swab  Result Value Ref Range Status   MRSA by PCR Next Gen NOT DETECTED NOT DETECTED Final    Comment: (NOTE) The GeneXpert MRSA Assay (FDA approved for NASAL specimens only), is one component of a comprehensive MRSA colonization surveillance program.  It is not intended to diagnose MRSA infection nor to guide or monitor treatment for MRSA infections. Test performance is not FDA approved in patients less than 25 years old. Performed at [facility], [address] [place], [place] [address]   Culture, blood (Routine X 2) w Reflex to ID Panel     Status: None   Collection Time: 10/20/22  2:36 PM   Specimen: BLOOD LEFT HAND  Result Value Ref Range Status   Specimen Description BLOOD LEFT HAND  Final   Special Requests   Final    BOTTLES DRAWN AEROBIC AND ANAEROBIC Blood Culture results may not be optimal due to an inadequate volume of blood received in culture bottles   Culture   Final    NO GROWTH 5 DAYS Performed at [facility], [address] [place], [place] [address]    Report Status 10/25/2022 FINAL  Final  Culture, blood (Routine X 2) w Reflex to ID Panel     Status: None   Collection Time: 10/20/22  2:36 PM   Specimen: BLOOD LEFT HAND  Result Value Ref Range Status   Specimen Description BLOOD LEFT HAND  Final   Special Requests   Final    BOTTLES DRAWN AEROBIC AND ANAEROBIC Blood Culture results may not be optimal due to an inadequate volume of blood received in culture bottles   Culture   Final    NO GROWTH 5 DAYS Performed at [facility], [address], [place], [place] [address]    Report Status 10/25/2022 FINAL  Final    RADIOLOGY STUDIES/RESULTS: No results found.   LOS: 12 days   Jeoffrey Massed, MD  [facility]    To contact the attending provider between 7A-7P or the covering provider during after hours 7P-7A, please log into the web site [email or web address] and access using universal  password for  that web site. If you do not have the password, please call the hospital operator.  10/26/2022, 9:46 AM

## 2022-10-26 NOTE — Plan of Care (Signed)

## 2022-10-26 NOTE — Progress Notes (Addendum)
Received a call from bedside RN regarding the patient's agitation, pulling at her lines and at her Cortrack tube feeding.  Presented at bedside.  The patient is alert and agitated.  Mildly tachycardic with HR in the 100's-110's, not hypoxic with O2 sats in the high 90's on room air.  1 dose IV ativan 1 mg x 1 ordered to be administered.  The patient was able to settle for about 2 hours.    Was advised again of the patient's recurrent agitation.  Presented at bedside.  The patient is attempting to pull at her lines and get out of bed.  1 dose Seroquel 12.5 mg x 1 ordered to be administered via Cortrack tube feeding for agitation delirium.  Soft bilateral wrist restrains ordered for patient's own safety.    Tube feeding paused so the patient could be placed in a supine position from elevated head of bed > 35 for her own comfort.    In an effort to regulate the patient's sleep and awake cycle, will allow her to rest and resume tube feeding in the morning.  Time:  15 minutes.

## 2022-10-26 NOTE — Progress Notes (Signed)
[facility] hospital liaison note  Referral received for patient/family interest in [facility].   Evaluation will take place tomorrow.   Please call with any questions or concerns. Thank you  Dionicio Stall, LCSW [facility] hospital liaison  [phone]

## 2022-10-26 NOTE — Progress Notes (Signed)
Family in patient's room. They are discussing with palliative care. Informed by palliative care that she is transitioning to full comfort care. Will sign off. Please call with any questions/concerns.  Anthony Sar, MD [facility]

## 2022-10-26 NOTE — Progress Notes (Signed)
1 unit PRBCs ordered to be transfused for hemoglobin of 6.5.  Will repeat CBC post blood transfusion per protocol.

## 2022-10-26 NOTE — Plan of Care (Signed)
Problem: Safety: Goal: Non-violent Restraint(s) Outcome: Completed/Met

## 2022-10-26 NOTE — Progress Notes (Addendum)
Daily Progress Note   Patient Name: Brandy Bishop       Date: 10/26/2022 DOB: 04/07/1960  Age: 62 y.o. MRN#: [id number] Attending Physician: Maretta Bees, MD Primary Care Physician: Cline Crock, NP Admit Date: 10/14/2022  Reason for Consultation/Follow-up: Establishing goals of care  Length of Stay: 12  Current Medications: Scheduled Meds:   folic acid  1 mg Per Tube Daily   mirtazapine  7.5 mg Per Tube QHS   multivitamin with minerals  1 tablet Per Tube Daily   mouth rinse  15 mL Mouth Rinse 4 times per day   pantoprazole (PROTONIX) IV  40 mg Intravenous Daily   sodium chloride flush  10-40 mL Intracatheter Q12H   thiamine  100 mg Per Tube Daily    Continuous Infusions:  sodium chloride Stopped (10/23/22 1249)   cefTRIAXone (ROCEPHIN)  IV 2 g (10/25/22 0842)   feeding supplement (OSMOLITE 1.2 CAL) 55 mL/hr at 10/26/22 0515   metronidazole 500 mg (10/25/22 2340)    PRN Meds: sodium chloride, acetaminophen **OR** acetaminophen, fentaNYL (SUBLIMAZE) injection, guaiFENesin, hydrALAZINE, labetalol, mouth rinse, sodium chloride flush, traMADol, white petrolatum  Physical Exam Vitals reviewed.  Constitutional:      General: She is sleeping.     Appearance: She is ill-appearing.     Comments: Cortrak in place, wrist restraints  HENT:     Head: Normocephalic and atraumatic.  Cardiovascular:     Rate and Rhythm: Tachycardia present.  Pulmonary:     Effort: Pulmonary effort is normal. Tachypnea present.  Abdominal:     General: There is distension.  Skin:    General: Skin is warm and dry.             Vital Signs: BP (!) 144/92   Pulse (!) 121   Temp 97.9 F (36.6 C) (Oral)   Resp 16   Ht 5\' 3"  (1.6 m)   Wt 58.1 kg   SpO2 98%   BMI 22.69 kg/m  SpO2: SpO2: 98 % O2 Device: O2 Device: Room Air O2 Flow Rate: O2  Flow Rate (L/min): 2 L/min    Patient Active Problem List   Diagnosis Date Noted   Sepsis due to Escherichia coli with encephalopathy without septic shock (HCC) 10/17/2022   Acute metabolic encephalopathy 10/17/2022   Severe sepsis (HCC) 10/14/2022   Hypovolemia 10/14/2022   Dehydration 10/14/2022   AKI (acute kidney injury) (HCC) 10/14/2022   CAP (community acquired pneumonia) 06/30/2022   Colon cancer screening 08/10/2020   Acute liver failure 06/08/2020   Acute GI bleeding 09/08/2019   Polysubstance abuse (HCC) 09/08/2019   Gastroesophageal reflux disease with esophagitis    Seizure (HCC) 05/30/2015   Cough    Subtherapeutic phenytoin level    Acute upper GI bleed    Hypomagnesemia 04/16/2014   Hypokalemia 04/16/2014   Thrombocytopenia (HCC) 04/16/2014   Liver disease 04/15/2014   ETOH abuse 04/15/2014   Chronic pancreatitis (HCC) 04/15/2014   Protein-calorie malnutrition, severe (HCC) 04/15/2014   Severe protein-calorie malnutrition (HCC) 04/15/2014   Bleeding gastrointestinal    Abnormal CT scan, colon    Rectal bleeding    Abdominal pain    Alcoholic hepatitis without ascites    GI  bleed 04/14/2014   Viral URI with cough 05/07/2012   Pyelonephritis 05/07/2012   Seizure disorder (HCC) 05/07/2012   Asthma with bronchitis 05/07/2012   HTN (hypertension) 05/07/2012    Palliative Care Assessment & Plan   Patient Profile: 62 y.o. female  with past medical history of ETOH use, asthma, chronic back pain, colitis, CIB, pancreatitis, and seizures admitted on 10/14/2022 with poor appetite and weakness. Patient found to have E coli bacteremia; STEC gastroenteritis. Severe AKI, no need for HD at this point. Has received some benzos during hospitalizations for withdrawal symptoms. PMT consulted for GOC discussions.    Discussion: Patient was sleeping in bed with soft wrist restraints. No family at bedside. Received update from RN.  9:25: Called brother Renae Gloss. Gave him  updates that patient had a rough night with agitation. Ativan and Seroquel administered. We discussed her continued altered mental status and agitation at night. She received a unit of packed red blood cells for a hemoglobin of 6.5.   He asked if they should avoid visiting today and I told him I think it is good for them to visit. He plans to visit later today.  They continue to hope and pray for her recovery and want time for further outcomes. PMT will continue to support. Family encouraged to call with questions or concerns.  1115: Met with family and Dr. Windell Norfolk. After discussing patient's continued poor prognosis family decided to transition the patient full comfort care. We discussed that the patient would no longer receive aggressive medical interventions such as continuous vital signs, lab work, radiology testing, or medications not focused on comfort. All care would focus on how the patient is looking and feeling. This would include management of any symptoms that may cause discomfort, pain, shortness of breath, cough, nausea, agitation, anxiety, and/or secretions etc. Symptoms would be managed with medications and other non-pharmacological interventions such as spiritual support if requested, repositioning, music therapy, or therapeutic listening. Family verbalized understanding and appreciation.   Family would like the patient transferred to [facility] if possible. TOC order placed. Encouraged family to call with any questions or concerns.  Recommendations/Plan: Full comfort measures Symptom management below Patient's brother Thersa Salt [phone] will remain the point of contact for the family. TOC order placed for residential hospice [facility]) Continued PMT support  Symptom Management: Dilaudid PRN for pain/air hunger/comfort Robinul PRN for excessive secretions Ativan PRN for agitation/anxiety Zofran PRN for nausea Liquifilm tears PRN for dry eyes Haldol PRN for  agitation/anxiety May have comfort feeding Comfort tray for family Unrestricted visitations in the setting of EOL (per policy) Oxygen PRN 2L or less for comfort. No escalation.    Care plan was discussed with bedside RN   Time spent: 25 minutes plus additional 45 minutes  Thank you for allowing the Palliative Medicine Team to assist in the care of this patient.     Sherryll Burger, NP  Please contact Palliative Medicine Team phone at [phone] for questions and concerns.

## 2022-10-27 ENCOUNTER — Inpatient Hospital Stay (HOSPITAL_COMMUNITY): Payer: MEDICAID

## 2022-10-27 DIAGNOSIS — A419 Sepsis, unspecified organism: Secondary | ICD-10-CM | POA: Diagnosis not present

## 2022-10-27 DIAGNOSIS — R6520 Severe sepsis without septic shock: Secondary | ICD-10-CM | POA: Diagnosis not present

## 2022-10-27 LAB — BPAM RBC
Blood Product Expiration Date: 202410212359
Blood Product Expiration Date: 202410242359
ISSUE DATE / TIME: 202409191556
ISSUE DATE / TIME: 202409210459
Unit Type and Rh: 7300
Unit Type and Rh: 7300

## 2022-10-27 LAB — TYPE AND SCREEN
ABO/RH(D): B POS
Antibody Screen: NEGATIVE
Unit division: 0
Unit division: 0

## 2022-10-27 MED ORDER — BISACODYL 10 MG RE SUPP
10.0000 mg | Freq: Once | RECTAL | Status: AC
  Administered 2022-10-27: 10 mg via RECTAL
  Filled 2022-10-27: qty 1

## 2022-10-27 NOTE — Discharge Summary (Signed)
10/16/2022 FINDINGS: The heart size and mediastinal contours are within normal limits. Similar appearance of minimal diffuse interstitial opacity. No new or focal airspace opacity. The visualized skeletal structures are unremarkable. IMPRESSION: Similar appearance of minimal diffuse interstitial opacity, consistent with edema or atypical/viral infection. No new or focal airspace opacity. Electronically Signed   By: Jearld Lesch M.D.   On: 10/17/2022 11:50   DG CHEST PORT 1 VIEW  Result Date: 10/16/2022 CLINICAL DATA:  Shortness of breath. EXAM: PORTABLE CHEST 1 VIEW COMPARISON:  Chest radiograph 10/14/2022. FINDINGS: Interval development of diffuse bilateral reticular opacities, which may reflect pulmonary edema or atypical/viral infection. No pleural effusion or pneumothorax. Stable cardiac and mediastinal contours. IMPRESSION: Interval development of diffuse bilateral reticular opacities, which may reflect pulmonary edema or atypical/viral infection. Electronically Signed   By: Orvan Falconer M.D.   On: 10/16/2022 10:42   DG Swallowing Func-Speech Pathology  Result Date: 10/15/2022 Table formatting from the original result was not included. Modified Barium Swallow Study Patient Details Name: Brandy Bishop MRN: [id number] Date of Birth: 1960/06/04 Today's Date: 10/15/2022 HPI/PMH: HPI: Pt is a 62 y.o. female who presented with generalized aches and malaise x3-4 days. She noted nausea, emesis, and diarreha, but her significant other denied noting these symptoms. Increasing confusion noted from family. PMH is significant for etoh abuse, panreatitis, seizures, hypertension, and GIB. Clinical Impression: Clinical Impression: Patient presents with a moderate oral dysphagia and mild pharyngeal phase dysphagia as per  this MBS. In addition, cognitive impairment impacted study, as patient was constantly moving her head around and required cues to reposition as well as to initiate swallow. Boluses of thin liquid, nectar thick liquids and puree solids were administered. Swallow was initiated at level of vallecular sinus. Epiglottic inversion was complete but anterior hyoid excursion and laryngeal elevation appeared partial in completion. Lingual control of bolus and tongue base retraction were both impaired, leading to poor bolus cohesion, reduced anterior to posterior oral transit of boluses. Mild amount of residuals observed in vallecular sinus, aryepiglottic folds, posterior pharyngeal wall and pyriform sinus with liquid boluses and moderate amount of vallecular residuals with puree solids were observed. Residuals. started to clear with subsequent swallows. Appearance of narrowing of space at PES observed which resulted in mild amount of barium remaining above PES after initial swallows before clearing during subsequent swallows. During esophageal sweep, appearance of decreased bolus cohesion and slow transit of puree solid barium observed. No aspiration or penetration seen. SLP recommending to continue with full liquids (thin) diet and will follow for toleration and ability to advance. Factors that may increase risk of adverse event in presence of aspiration Rubye Oaks & Clearance Coots 2021): Factors that may increase risk of adverse event in presence of aspiration Rubye Oaks & Clearance Coots 2021): Frail or deconditioned; Reduced cognitive function; Weak cough; Poor general health and/or compromised immunity Recommendations/Plan: Swallowing Evaluation Recommendations Swallowing Evaluation Recommendations Recommendations: PO diet PO Diet Recommendation: Full liquid diet; Thin liquids (Level 0) Liquid Administration via: Cup; Straw Medication Administration: Crushed with puree Supervision: Full supervision/cueing for swallowing strategies; Staff to  assist with self-feeding; Patient able to self-feed Swallowing strategies  : Slow rate; Small bites/sips; Minimize environmental distractions Postural changes: Position pt fully upright for meals; Stay upright 30-60 min after meals Oral care recommendations: Oral care BID (2x/day) Treatment Plan Treatment Plan Treatment recommendations: Therapy as outlined in treatment plan below Follow-up recommendations: No SLP follow up Functional status assessment: Patient has had a recent decline in their functional status and demonstrates the ability  Brandy Bishop WJX:[id number] DOB: 01-Jan-1961 DOA: 10/14/2022  PCP: Cline Crock, NP  Admit date: 10/14/2022  Discharge date: 10/27/2022  Admitted From: Home   Disposition:  Residential Hospice   Recommendations for Outpatient Follow-up:   Follow up with PCP in 1-2 weeks  PCP Please obtain BMP/CBC, 2 view CXR in 1week,  (see Discharge instructions)   PCP Please follow up on the following pending results:   Discharge Condition: Guarded CODE STATUS: DNR    Chief Complaint  Patient presents with   Generalized Body Aches     Brief history of present illness from the day of admission and additional interim summary    62 y.o.  female with history of EtOH use, HTN, asthma-who presented with confusion/body aches/diarrhea-she was initially thought to have sepsis physiology due to pyelonephritis-however her stool studies was positive for Shiga like toxin E. coli-subsequent blood cultures came back positive for E. coli as well.   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  Unfortunately-she continued to deteriorate-after discussion with palliative care she was made a DNR.  She was not considered a dialysis candidate.  See below for further details.   Significant events: 9/9>> admission to [facility] 9/11>> CCM consult for increased work of breathing/pulm edema. 9/12>>stool test positive for STEC, STEC-HUS being considered 9/14>>: Oncology consulted, given 1 dose of eculizumab, made DNR after discussion with palliative care 9/17: Transferred to the hospitalist service   Significant studies: 9/9>> CXR: No active disease 9/9>> RUQ ultrasound: No bowel stones. 9/9>> CT abdomen: No acute intracranial abnormality 9/9>> CT  abdomen/pelvis: Bile lateral perinephric stranding, moderate air/fluid distention of the colon/rectum. 9/11>> CXR: Interval development of pulm edema 9/16>> bilateral upper extremity Doppler: No DVT   Significant microbiology data: 9/09>> COVID/influenza PCR: Negative 9/09>> blood culture: E. Coli 9/10>> GI pathogen panel: Shiga like toxin producing E. coli. 9/10>> C. difficile: Negative 9/15>> blood culture: No growth   Procedures: 9/13>>Cortrak tube   Consults: PCCM Nephrology Palliative care Oncology                                                                 Hospital Course    On 10/26/2022 patient was seen by palliative care team along with hospitalist treatment team, long discussions with made with patient's family he is now transition to full comfort measures, looking for residential hospice, goal of care is now comfort directed. All known comfort medications stopped, other medical problems addressed earlier this admission are below.      Severe sepsis due to E. coli bacteremia in the setting of gastroenteritis due to Shigella producing E. Coli Although sepsis physiology overall better-she has been febrile for the past 3 days  Concern for aspiration-on Rocephin-Flagyl was added yesterday  Although  Brandy Bishop WJX:[id number] DOB: 01-Jan-1961 DOA: 10/14/2022  PCP: Cline Crock, NP  Admit date: 10/14/2022  Discharge date: 10/27/2022  Admitted From: Home   Disposition:  Residential Hospice   Recommendations for Outpatient Follow-up:   Follow up with PCP in 1-2 weeks  PCP Please obtain BMP/CBC, 2 view CXR in 1week,  (see Discharge instructions)   PCP Please follow up on the following pending results:   Discharge Condition: Guarded CODE STATUS: DNR    Chief Complaint  Patient presents with   Generalized Body Aches     Brief history of present illness from the day of admission and additional interim summary    62 y.o.  female with history of EtOH use, HTN, asthma-who presented with confusion/body aches/diarrhea-she was initially thought to have sepsis physiology due to pyelonephritis-however her stool studies was positive for Shiga like toxin E. coli-subsequent blood cultures came back positive for E. coli as well.   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  Unfortunately-she continued to deteriorate-after discussion with palliative care she was made a DNR.  She was not considered a dialysis candidate.  See below for further details.   Significant events: 9/9>> admission to [facility] 9/11>> CCM consult for increased work of breathing/pulm edema. 9/12>>stool test positive for STEC, STEC-HUS being considered 9/14>>: Oncology consulted, given 1 dose of eculizumab, made DNR after discussion with palliative care 9/17: Transferred to the hospitalist service   Significant studies: 9/9>> CXR: No active disease 9/9>> RUQ ultrasound: No bowel stones. 9/9>> CT abdomen: No acute intracranial abnormality 9/9>> CT  abdomen/pelvis: Bile lateral perinephric stranding, moderate air/fluid distention of the colon/rectum. 9/11>> CXR: Interval development of pulm edema 9/16>> bilateral upper extremity Doppler: No DVT   Significant microbiology data: 9/09>> COVID/influenza PCR: Negative 9/09>> blood culture: E. Coli 9/10>> GI pathogen panel: Shiga like toxin producing E. coli. 9/10>> C. difficile: Negative 9/15>> blood culture: No growth   Procedures: 9/13>>Cortrak tube   Consults: PCCM Nephrology Palliative care Oncology                                                                 Hospital Course    On 10/26/2022 patient was seen by palliative care team along with hospitalist treatment team, long discussions with made with patient's family he is now transition to full comfort measures, looking for residential hospice, goal of care is now comfort directed. All known comfort medications stopped, other medical problems addressed earlier this admission are below.      Severe sepsis due to E. coli bacteremia in the setting of gastroenteritis due to Shigella producing E. Coli Although sepsis physiology overall better-she has been febrile for the past 3 days  Concern for aspiration-on Rocephin-Flagyl was added yesterday  Although  Brandy Bishop WJX:[id number] DOB: 01-Jan-1961 DOA: 10/14/2022  PCP: Cline Crock, NP  Admit date: 10/14/2022  Discharge date: 10/27/2022  Admitted From: Home   Disposition:  Residential Hospice   Recommendations for Outpatient Follow-up:   Follow up with PCP in 1-2 weeks  PCP Please obtain BMP/CBC, 2 view CXR in 1week,  (see Discharge instructions)   PCP Please follow up on the following pending results:   Discharge Condition: Guarded CODE STATUS: DNR    Chief Complaint  Patient presents with   Generalized Body Aches     Brief history of present illness from the day of admission and additional interim summary    62 y.o.  female with history of EtOH use, HTN, asthma-who presented with confusion/body aches/diarrhea-she was initially thought to have sepsis physiology due to pyelonephritis-however her stool studies was positive for Shiga like toxin E. coli-subsequent blood cultures came back positive for E. coli as well.   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  Unfortunately-she continued to deteriorate-after discussion with palliative care she was made a DNR.  She was not considered a dialysis candidate.  See below for further details.   Significant events: 9/9>> admission to [facility] 9/11>> CCM consult for increased work of breathing/pulm edema. 9/12>>stool test positive for STEC, STEC-HUS being considered 9/14>>: Oncology consulted, given 1 dose of eculizumab, made DNR after discussion with palliative care 9/17: Transferred to the hospitalist service   Significant studies: 9/9>> CXR: No active disease 9/9>> RUQ ultrasound: No bowel stones. 9/9>> CT abdomen: No acute intracranial abnormality 9/9>> CT  abdomen/pelvis: Bile lateral perinephric stranding, moderate air/fluid distention of the colon/rectum. 9/11>> CXR: Interval development of pulm edema 9/16>> bilateral upper extremity Doppler: No DVT   Significant microbiology data: 9/09>> COVID/influenza PCR: Negative 9/09>> blood culture: E. Coli 9/10>> GI pathogen panel: Shiga like toxin producing E. coli. 9/10>> C. difficile: Negative 9/15>> blood culture: No growth   Procedures: 9/13>>Cortrak tube   Consults: PCCM Nephrology Palliative care Oncology                                                                 Hospital Course    On 10/26/2022 patient was seen by palliative care team along with hospitalist treatment team, long discussions with made with patient's family he is now transition to full comfort measures, looking for residential hospice, goal of care is now comfort directed. All known comfort medications stopped, other medical problems addressed earlier this admission are below.      Severe sepsis due to E. coli bacteremia in the setting of gastroenteritis due to Shigella producing E. Coli Although sepsis physiology overall better-she has been febrile for the past 3 days  Concern for aspiration-on Rocephin-Flagyl was added yesterday  Although  10/16/2022 FINDINGS: The heart size and mediastinal contours are within normal limits. Similar appearance of minimal diffuse interstitial opacity. No new or focal airspace opacity. The visualized skeletal structures are unremarkable. IMPRESSION: Similar appearance of minimal diffuse interstitial opacity, consistent with edema or atypical/viral infection. No new or focal airspace opacity. Electronically Signed   By: Jearld Lesch M.D.   On: 10/17/2022 11:50   DG CHEST PORT 1 VIEW  Result Date: 10/16/2022 CLINICAL DATA:  Shortness of breath. EXAM: PORTABLE CHEST 1 VIEW COMPARISON:  Chest radiograph 10/14/2022. FINDINGS: Interval development of diffuse bilateral reticular opacities, which may reflect pulmonary edema or atypical/viral infection. No pleural effusion or pneumothorax. Stable cardiac and mediastinal contours. IMPRESSION: Interval development of diffuse bilateral reticular opacities, which may reflect pulmonary edema or atypical/viral infection. Electronically Signed   By: Orvan Falconer M.D.   On: 10/16/2022 10:42   DG Swallowing Func-Speech Pathology  Result Date: 10/15/2022 Table formatting from the original result was not included. Modified Barium Swallow Study Patient Details Name: Brandy Bishop MRN: [id number] Date of Birth: 1960/06/04 Today's Date: 10/15/2022 HPI/PMH: HPI: Pt is a 62 y.o. female who presented with generalized aches and malaise x3-4 days. She noted nausea, emesis, and diarreha, but her significant other denied noting these symptoms. Increasing confusion noted from family. PMH is significant for etoh abuse, panreatitis, seizures, hypertension, and GIB. Clinical Impression: Clinical Impression: Patient presents with a moderate oral dysphagia and mild pharyngeal phase dysphagia as per  this MBS. In addition, cognitive impairment impacted study, as patient was constantly moving her head around and required cues to reposition as well as to initiate swallow. Boluses of thin liquid, nectar thick liquids and puree solids were administered. Swallow was initiated at level of vallecular sinus. Epiglottic inversion was complete but anterior hyoid excursion and laryngeal elevation appeared partial in completion. Lingual control of bolus and tongue base retraction were both impaired, leading to poor bolus cohesion, reduced anterior to posterior oral transit of boluses. Mild amount of residuals observed in vallecular sinus, aryepiglottic folds, posterior pharyngeal wall and pyriform sinus with liquid boluses and moderate amount of vallecular residuals with puree solids were observed. Residuals. started to clear with subsequent swallows. Appearance of narrowing of space at PES observed which resulted in mild amount of barium remaining above PES after initial swallows before clearing during subsequent swallows. During esophageal sweep, appearance of decreased bolus cohesion and slow transit of puree solid barium observed. No aspiration or penetration seen. SLP recommending to continue with full liquids (thin) diet and will follow for toleration and ability to advance. Factors that may increase risk of adverse event in presence of aspiration Rubye Oaks & Clearance Coots 2021): Factors that may increase risk of adverse event in presence of aspiration Rubye Oaks & Clearance Coots 2021): Frail or deconditioned; Reduced cognitive function; Weak cough; Poor general health and/or compromised immunity Recommendations/Plan: Swallowing Evaluation Recommendations Swallowing Evaluation Recommendations Recommendations: PO diet PO Diet Recommendation: Full liquid diet; Thin liquids (Level 0) Liquid Administration via: Cup; Straw Medication Administration: Crushed with puree Supervision: Full supervision/cueing for swallowing strategies; Staff to  assist with self-feeding; Patient able to self-feed Swallowing strategies  : Slow rate; Small bites/sips; Minimize environmental distractions Postural changes: Position pt fully upright for meals; Stay upright 30-60 min after meals Oral care recommendations: Oral care BID (2x/day) Treatment Plan Treatment Plan Treatment recommendations: Therapy as outlined in treatment plan below Follow-up recommendations: No SLP follow up Functional status assessment: Patient has had a recent decline in their functional status and demonstrates the ability  10/16/2022 FINDINGS: The heart size and mediastinal contours are within normal limits. Similar appearance of minimal diffuse interstitial opacity. No new or focal airspace opacity. The visualized skeletal structures are unremarkable. IMPRESSION: Similar appearance of minimal diffuse interstitial opacity, consistent with edema or atypical/viral infection. No new or focal airspace opacity. Electronically Signed   By: Jearld Lesch M.D.   On: 10/17/2022 11:50   DG CHEST PORT 1 VIEW  Result Date: 10/16/2022 CLINICAL DATA:  Shortness of breath. EXAM: PORTABLE CHEST 1 VIEW COMPARISON:  Chest radiograph 10/14/2022. FINDINGS: Interval development of diffuse bilateral reticular opacities, which may reflect pulmonary edema or atypical/viral infection. No pleural effusion or pneumothorax. Stable cardiac and mediastinal contours. IMPRESSION: Interval development of diffuse bilateral reticular opacities, which may reflect pulmonary edema or atypical/viral infection. Electronically Signed   By: Orvan Falconer M.D.   On: 10/16/2022 10:42   DG Swallowing Func-Speech Pathology  Result Date: 10/15/2022 Table formatting from the original result was not included. Modified Barium Swallow Study Patient Details Name: Brandy Bishop MRN: [id number] Date of Birth: 1960/06/04 Today's Date: 10/15/2022 HPI/PMH: HPI: Pt is a 62 y.o. female who presented with generalized aches and malaise x3-4 days. She noted nausea, emesis, and diarreha, but her significant other denied noting these symptoms. Increasing confusion noted from family. PMH is significant for etoh abuse, panreatitis, seizures, hypertension, and GIB. Clinical Impression: Clinical Impression: Patient presents with a moderate oral dysphagia and mild pharyngeal phase dysphagia as per  this MBS. In addition, cognitive impairment impacted study, as patient was constantly moving her head around and required cues to reposition as well as to initiate swallow. Boluses of thin liquid, nectar thick liquids and puree solids were administered. Swallow was initiated at level of vallecular sinus. Epiglottic inversion was complete but anterior hyoid excursion and laryngeal elevation appeared partial in completion. Lingual control of bolus and tongue base retraction were both impaired, leading to poor bolus cohesion, reduced anterior to posterior oral transit of boluses. Mild amount of residuals observed in vallecular sinus, aryepiglottic folds, posterior pharyngeal wall and pyriform sinus with liquid boluses and moderate amount of vallecular residuals with puree solids were observed. Residuals. started to clear with subsequent swallows. Appearance of narrowing of space at PES observed which resulted in mild amount of barium remaining above PES after initial swallows before clearing during subsequent swallows. During esophageal sweep, appearance of decreased bolus cohesion and slow transit of puree solid barium observed. No aspiration or penetration seen. SLP recommending to continue with full liquids (thin) diet and will follow for toleration and ability to advance. Factors that may increase risk of adverse event in presence of aspiration Rubye Oaks & Clearance Coots 2021): Factors that may increase risk of adverse event in presence of aspiration Rubye Oaks & Clearance Coots 2021): Frail or deconditioned; Reduced cognitive function; Weak cough; Poor general health and/or compromised immunity Recommendations/Plan: Swallowing Evaluation Recommendations Swallowing Evaluation Recommendations Recommendations: PO diet PO Diet Recommendation: Full liquid diet; Thin liquids (Level 0) Liquid Administration via: Cup; Straw Medication Administration: Crushed with puree Supervision: Full supervision/cueing for swallowing strategies; Staff to  assist with self-feeding; Patient able to self-feed Swallowing strategies  : Slow rate; Small bites/sips; Minimize environmental distractions Postural changes: Position pt fully upright for meals; Stay upright 30-60 min after meals Oral care recommendations: Oral care BID (2x/day) Treatment Plan Treatment Plan Treatment recommendations: Therapy as outlined in treatment plan below Follow-up recommendations: No SLP follow up Functional status assessment: Patient has had a recent decline in their functional status and demonstrates the ability  10/16/2022 FINDINGS: The heart size and mediastinal contours are within normal limits. Similar appearance of minimal diffuse interstitial opacity. No new or focal airspace opacity. The visualized skeletal structures are unremarkable. IMPRESSION: Similar appearance of minimal diffuse interstitial opacity, consistent with edema or atypical/viral infection. No new or focal airspace opacity. Electronically Signed   By: Jearld Lesch M.D.   On: 10/17/2022 11:50   DG CHEST PORT 1 VIEW  Result Date: 10/16/2022 CLINICAL DATA:  Shortness of breath. EXAM: PORTABLE CHEST 1 VIEW COMPARISON:  Chest radiograph 10/14/2022. FINDINGS: Interval development of diffuse bilateral reticular opacities, which may reflect pulmonary edema or atypical/viral infection. No pleural effusion or pneumothorax. Stable cardiac and mediastinal contours. IMPRESSION: Interval development of diffuse bilateral reticular opacities, which may reflect pulmonary edema or atypical/viral infection. Electronically Signed   By: Orvan Falconer M.D.   On: 10/16/2022 10:42   DG Swallowing Func-Speech Pathology  Result Date: 10/15/2022 Table formatting from the original result was not included. Modified Barium Swallow Study Patient Details Name: Brandy Bishop MRN: [id number] Date of Birth: 1960/06/04 Today's Date: 10/15/2022 HPI/PMH: HPI: Pt is a 62 y.o. female who presented with generalized aches and malaise x3-4 days. She noted nausea, emesis, and diarreha, but her significant other denied noting these symptoms. Increasing confusion noted from family. PMH is significant for etoh abuse, panreatitis, seizures, hypertension, and GIB. Clinical Impression: Clinical Impression: Patient presents with a moderate oral dysphagia and mild pharyngeal phase dysphagia as per  this MBS. In addition, cognitive impairment impacted study, as patient was constantly moving her head around and required cues to reposition as well as to initiate swallow. Boluses of thin liquid, nectar thick liquids and puree solids were administered. Swallow was initiated at level of vallecular sinus. Epiglottic inversion was complete but anterior hyoid excursion and laryngeal elevation appeared partial in completion. Lingual control of bolus and tongue base retraction were both impaired, leading to poor bolus cohesion, reduced anterior to posterior oral transit of boluses. Mild amount of residuals observed in vallecular sinus, aryepiglottic folds, posterior pharyngeal wall and pyriform sinus with liquid boluses and moderate amount of vallecular residuals with puree solids were observed. Residuals. started to clear with subsequent swallows. Appearance of narrowing of space at PES observed which resulted in mild amount of barium remaining above PES after initial swallows before clearing during subsequent swallows. During esophageal sweep, appearance of decreased bolus cohesion and slow transit of puree solid barium observed. No aspiration or penetration seen. SLP recommending to continue with full liquids (thin) diet and will follow for toleration and ability to advance. Factors that may increase risk of adverse event in presence of aspiration Rubye Oaks & Clearance Coots 2021): Factors that may increase risk of adverse event in presence of aspiration Rubye Oaks & Clearance Coots 2021): Frail or deconditioned; Reduced cognitive function; Weak cough; Poor general health and/or compromised immunity Recommendations/Plan: Swallowing Evaluation Recommendations Swallowing Evaluation Recommendations Recommendations: PO diet PO Diet Recommendation: Full liquid diet; Thin liquids (Level 0) Liquid Administration via: Cup; Straw Medication Administration: Crushed with puree Supervision: Full supervision/cueing for swallowing strategies; Staff to  assist with self-feeding; Patient able to self-feed Swallowing strategies  : Slow rate; Small bites/sips; Minimize environmental distractions Postural changes: Position pt fully upright for meals; Stay upright 30-60 min after meals Oral care recommendations: Oral care BID (2x/day) Treatment Plan Treatment Plan Treatment recommendations: Therapy as outlined in treatment plan below Follow-up recommendations: No SLP follow up Functional status assessment: Patient has had a recent decline in their functional status and demonstrates the ability  Brandy Bishop WJX:[id number] DOB: 01-Jan-1961 DOA: 10/14/2022  PCP: Cline Crock, NP  Admit date: 10/14/2022  Discharge date: 10/27/2022  Admitted From: Home   Disposition:  Residential Hospice   Recommendations for Outpatient Follow-up:   Follow up with PCP in 1-2 weeks  PCP Please obtain BMP/CBC, 2 view CXR in 1week,  (see Discharge instructions)   PCP Please follow up on the following pending results:   Discharge Condition: Guarded CODE STATUS: DNR    Chief Complaint  Patient presents with   Generalized Body Aches     Brief history of present illness from the day of admission and additional interim summary    62 y.o.  female with history of EtOH use, HTN, asthma-who presented with confusion/body aches/diarrhea-she was initially thought to have sepsis physiology due to pyelonephritis-however her stool studies was positive for Shiga like toxin E. coli-subsequent blood cultures came back positive for E. coli as well.   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  Unfortunately-she continued to deteriorate-after discussion with palliative care she was made a DNR.  She was not considered a dialysis candidate.  See below for further details.   Significant events: 9/9>> admission to [facility] 9/11>> CCM consult for increased work of breathing/pulm edema. 9/12>>stool test positive for STEC, STEC-HUS being considered 9/14>>: Oncology consulted, given 1 dose of eculizumab, made DNR after discussion with palliative care 9/17: Transferred to the hospitalist service   Significant studies: 9/9>> CXR: No active disease 9/9>> RUQ ultrasound: No bowel stones. 9/9>> CT abdomen: No acute intracranial abnormality 9/9>> CT  abdomen/pelvis: Bile lateral perinephric stranding, moderate air/fluid distention of the colon/rectum. 9/11>> CXR: Interval development of pulm edema 9/16>> bilateral upper extremity Doppler: No DVT   Significant microbiology data: 9/09>> COVID/influenza PCR: Negative 9/09>> blood culture: E. Coli 9/10>> GI pathogen panel: Shiga like toxin producing E. coli. 9/10>> C. difficile: Negative 9/15>> blood culture: No growth   Procedures: 9/13>>Cortrak tube   Consults: PCCM Nephrology Palliative care Oncology                                                                 Hospital Course    On 10/26/2022 patient was seen by palliative care team along with hospitalist treatment team, long discussions with made with patient's family he is now transition to full comfort measures, looking for residential hospice, goal of care is now comfort directed. All known comfort medications stopped, other medical problems addressed earlier this admission are below.      Severe sepsis due to E. coli bacteremia in the setting of gastroenteritis due to Shigella producing E. Coli Although sepsis physiology overall better-she has been febrile for the past 3 days  Concern for aspiration-on Rocephin-Flagyl was added yesterday  Although  10/16/2022 FINDINGS: The heart size and mediastinal contours are within normal limits. Similar appearance of minimal diffuse interstitial opacity. No new or focal airspace opacity. The visualized skeletal structures are unremarkable. IMPRESSION: Similar appearance of minimal diffuse interstitial opacity, consistent with edema or atypical/viral infection. No new or focal airspace opacity. Electronically Signed   By: Jearld Lesch M.D.   On: 10/17/2022 11:50   DG CHEST PORT 1 VIEW  Result Date: 10/16/2022 CLINICAL DATA:  Shortness of breath. EXAM: PORTABLE CHEST 1 VIEW COMPARISON:  Chest radiograph 10/14/2022. FINDINGS: Interval development of diffuse bilateral reticular opacities, which may reflect pulmonary edema or atypical/viral infection. No pleural effusion or pneumothorax. Stable cardiac and mediastinal contours. IMPRESSION: Interval development of diffuse bilateral reticular opacities, which may reflect pulmonary edema or atypical/viral infection. Electronically Signed   By: Orvan Falconer M.D.   On: 10/16/2022 10:42   DG Swallowing Func-Speech Pathology  Result Date: 10/15/2022 Table formatting from the original result was not included. Modified Barium Swallow Study Patient Details Name: Brandy Bishop MRN: [id number] Date of Birth: 1960/06/04 Today's Date: 10/15/2022 HPI/PMH: HPI: Pt is a 62 y.o. female who presented with generalized aches and malaise x3-4 days. She noted nausea, emesis, and diarreha, but her significant other denied noting these symptoms. Increasing confusion noted from family. PMH is significant for etoh abuse, panreatitis, seizures, hypertension, and GIB. Clinical Impression: Clinical Impression: Patient presents with a moderate oral dysphagia and mild pharyngeal phase dysphagia as per  this MBS. In addition, cognitive impairment impacted study, as patient was constantly moving her head around and required cues to reposition as well as to initiate swallow. Boluses of thin liquid, nectar thick liquids and puree solids were administered. Swallow was initiated at level of vallecular sinus. Epiglottic inversion was complete but anterior hyoid excursion and laryngeal elevation appeared partial in completion. Lingual control of bolus and tongue base retraction were both impaired, leading to poor bolus cohesion, reduced anterior to posterior oral transit of boluses. Mild amount of residuals observed in vallecular sinus, aryepiglottic folds, posterior pharyngeal wall and pyriform sinus with liquid boluses and moderate amount of vallecular residuals with puree solids were observed. Residuals. started to clear with subsequent swallows. Appearance of narrowing of space at PES observed which resulted in mild amount of barium remaining above PES after initial swallows before clearing during subsequent swallows. During esophageal sweep, appearance of decreased bolus cohesion and slow transit of puree solid barium observed. No aspiration or penetration seen. SLP recommending to continue with full liquids (thin) diet and will follow for toleration and ability to advance. Factors that may increase risk of adverse event in presence of aspiration Rubye Oaks & Clearance Coots 2021): Factors that may increase risk of adverse event in presence of aspiration Rubye Oaks & Clearance Coots 2021): Frail or deconditioned; Reduced cognitive function; Weak cough; Poor general health and/or compromised immunity Recommendations/Plan: Swallowing Evaluation Recommendations Swallowing Evaluation Recommendations Recommendations: PO diet PO Diet Recommendation: Full liquid diet; Thin liquids (Level 0) Liquid Administration via: Cup; Straw Medication Administration: Crushed with puree Supervision: Full supervision/cueing for swallowing strategies; Staff to  assist with self-feeding; Patient able to self-feed Swallowing strategies  : Slow rate; Small bites/sips; Minimize environmental distractions Postural changes: Position pt fully upright for meals; Stay upright 30-60 min after meals Oral care recommendations: Oral care BID (2x/day) Treatment Plan Treatment Plan Treatment recommendations: Therapy as outlined in treatment plan below Follow-up recommendations: No SLP follow up Functional status assessment: Patient has had a recent decline in their functional status and demonstrates the ability  10/16/2022 FINDINGS: The heart size and mediastinal contours are within normal limits. Similar appearance of minimal diffuse interstitial opacity. No new or focal airspace opacity. The visualized skeletal structures are unremarkable. IMPRESSION: Similar appearance of minimal diffuse interstitial opacity, consistent with edema or atypical/viral infection. No new or focal airspace opacity. Electronically Signed   By: Jearld Lesch M.D.   On: 10/17/2022 11:50   DG CHEST PORT 1 VIEW  Result Date: 10/16/2022 CLINICAL DATA:  Shortness of breath. EXAM: PORTABLE CHEST 1 VIEW COMPARISON:  Chest radiograph 10/14/2022. FINDINGS: Interval development of diffuse bilateral reticular opacities, which may reflect pulmonary edema or atypical/viral infection. No pleural effusion or pneumothorax. Stable cardiac and mediastinal contours. IMPRESSION: Interval development of diffuse bilateral reticular opacities, which may reflect pulmonary edema or atypical/viral infection. Electronically Signed   By: Orvan Falconer M.D.   On: 10/16/2022 10:42   DG Swallowing Func-Speech Pathology  Result Date: 10/15/2022 Table formatting from the original result was not included. Modified Barium Swallow Study Patient Details Name: Brandy Bishop MRN: [id number] Date of Birth: 1960/06/04 Today's Date: 10/15/2022 HPI/PMH: HPI: Pt is a 62 y.o. female who presented with generalized aches and malaise x3-4 days. She noted nausea, emesis, and diarreha, but her significant other denied noting these symptoms. Increasing confusion noted from family. PMH is significant for etoh abuse, panreatitis, seizures, hypertension, and GIB. Clinical Impression: Clinical Impression: Patient presents with a moderate oral dysphagia and mild pharyngeal phase dysphagia as per  this MBS. In addition, cognitive impairment impacted study, as patient was constantly moving her head around and required cues to reposition as well as to initiate swallow. Boluses of thin liquid, nectar thick liquids and puree solids were administered. Swallow was initiated at level of vallecular sinus. Epiglottic inversion was complete but anterior hyoid excursion and laryngeal elevation appeared partial in completion. Lingual control of bolus and tongue base retraction were both impaired, leading to poor bolus cohesion, reduced anterior to posterior oral transit of boluses. Mild amount of residuals observed in vallecular sinus, aryepiglottic folds, posterior pharyngeal wall and pyriform sinus with liquid boluses and moderate amount of vallecular residuals with puree solids were observed. Residuals. started to clear with subsequent swallows. Appearance of narrowing of space at PES observed which resulted in mild amount of barium remaining above PES after initial swallows before clearing during subsequent swallows. During esophageal sweep, appearance of decreased bolus cohesion and slow transit of puree solid barium observed. No aspiration or penetration seen. SLP recommending to continue with full liquids (thin) diet and will follow for toleration and ability to advance. Factors that may increase risk of adverse event in presence of aspiration Rubye Oaks & Clearance Coots 2021): Factors that may increase risk of adverse event in presence of aspiration Rubye Oaks & Clearance Coots 2021): Frail or deconditioned; Reduced cognitive function; Weak cough; Poor general health and/or compromised immunity Recommendations/Plan: Swallowing Evaluation Recommendations Swallowing Evaluation Recommendations Recommendations: PO diet PO Diet Recommendation: Full liquid diet; Thin liquids (Level 0) Liquid Administration via: Cup; Straw Medication Administration: Crushed with puree Supervision: Full supervision/cueing for swallowing strategies; Staff to  assist with self-feeding; Patient able to self-feed Swallowing strategies  : Slow rate; Small bites/sips; Minimize environmental distractions Postural changes: Position pt fully upright for meals; Stay upright 30-60 min after meals Oral care recommendations: Oral care BID (2x/day) Treatment Plan Treatment Plan Treatment recommendations: Therapy as outlined in treatment plan below Follow-up recommendations: No SLP follow up Functional status assessment: Patient has had a recent decline in their functional status and demonstrates the ability  Brandy Bishop WJX:[id number] DOB: 01-Jan-1961 DOA: 10/14/2022  PCP: Cline Crock, NP  Admit date: 10/14/2022  Discharge date: 10/27/2022  Admitted From: Home   Disposition:  Residential Hospice   Recommendations for Outpatient Follow-up:   Follow up with PCP in 1-2 weeks  PCP Please obtain BMP/CBC, 2 view CXR in 1week,  (see Discharge instructions)   PCP Please follow up on the following pending results:   Discharge Condition: Guarded CODE STATUS: DNR    Chief Complaint  Patient presents with   Generalized Body Aches     Brief history of present illness from the day of admission and additional interim summary    62 y.o.  female with history of EtOH use, HTN, asthma-who presented with confusion/body aches/diarrhea-she was initially thought to have sepsis physiology due to pyelonephritis-however her stool studies was positive for Shiga like toxin E. coli-subsequent blood cultures came back positive for E. coli as well.   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  Unfortunately-she continued to deteriorate-after discussion with palliative care she was made a DNR.  She was not considered a dialysis candidate.  See below for further details.   Significant events: 9/9>> admission to [facility] 9/11>> CCM consult for increased work of breathing/pulm edema. 9/12>>stool test positive for STEC, STEC-HUS being considered 9/14>>: Oncology consulted, given 1 dose of eculizumab, made DNR after discussion with palliative care 9/17: Transferred to the hospitalist service   Significant studies: 9/9>> CXR: No active disease 9/9>> RUQ ultrasound: No bowel stones. 9/9>> CT abdomen: No acute intracranial abnormality 9/9>> CT  abdomen/pelvis: Bile lateral perinephric stranding, moderate air/fluid distention of the colon/rectum. 9/11>> CXR: Interval development of pulm edema 9/16>> bilateral upper extremity Doppler: No DVT   Significant microbiology data: 9/09>> COVID/influenza PCR: Negative 9/09>> blood culture: E. Coli 9/10>> GI pathogen panel: Shiga like toxin producing E. coli. 9/10>> C. difficile: Negative 9/15>> blood culture: No growth   Procedures: 9/13>>Cortrak tube   Consults: PCCM Nephrology Palliative care Oncology                                                                 Hospital Course    On 10/26/2022 patient was seen by palliative care team along with hospitalist treatment team, long discussions with made with patient's family he is now transition to full comfort measures, looking for residential hospice, goal of care is now comfort directed. All known comfort medications stopped, other medical problems addressed earlier this admission are below.      Severe sepsis due to E. coli bacteremia in the setting of gastroenteritis due to Shigella producing E. Coli Although sepsis physiology overall better-she has been febrile for the past 3 days  Concern for aspiration-on Rocephin-Flagyl was added yesterday  Although

## 2022-10-27 NOTE — Plan of Care (Signed)
  Problem: Clinical Measurements: Goal: Respiratory complications will improve Outcome: Progressing   Problem: Pain Managment: Goal: General experience of comfort will improve Outcome: Progressing   Problem: Role Relationship: Goal: Family's ability to cope with current situation will improve Outcome: Progressing

## 2022-10-27 NOTE — Progress Notes (Signed)
PROGRESS NOTE        PATIENT DETAILS Name: Brandy Bishop Age: 62 y.o. Sex: female Date of Birth: 1960-04-14 Admit Date: 10/14/2022 Admitting Physician A Grier Mitts., MD [id number], Maxine Glenn, NP  Brief Summary: Patient is a 62 y.o.  female with history of EtOH use, HTN, asthma-who presented with confusion/body aches/diarrhea-she was initially thought to have sepsis physiology due to pyelonephritis-however her stool studies was positive for Shiga like toxin E. coli-subsequent blood cultures came back positive for E. coli as well.   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  Unfortunately-she continued to deteriorate-after discussion with palliative care she was made a DNR.  She was not considered a dialysis candidate.  See below for further details.  Significant events: 9/9>> admission to [facility] 9/11>> CCM consult for increased work of breathing/pulm edema. 9/12>>stool test positive for STEC, STEC-HUS being considered 9/14>>: Oncology consulted, given 1 dose of eculizumab, made DNR after discussion with palliative care 9/17: Transferred to the hospitalist service  Significant studies: 9/9>> CXR: No active disease 9/9>> RUQ ultrasound: No bowel stones. 9/9>> CT abdomen: No acute intracranial abnormality 9/9>> CT abdomen/pelvis: Bile lateral perinephric stranding, moderate air/fluid distention of the colon/rectum. 9/11>> CXR: Interval development of pulm edema 9/16>> bilateral upper extremity Doppler: No DVT  Significant microbiology data: 9/09>> COVID/influenza PCR: Negative 9/09>> blood culture: E. Coli 9/10>> GI pathogen panel: Shiga like toxin producing E. coli. 9/10>> C. difficile: Negative 9/15>> blood culture: No growth  Procedures: 9/13>>Cortrak tube  Consults: PCCM Nephrology Palliative care Oncology  Subjective: Patient in bed,  confused unable to provide any review of systems  Objective: Vitals: Blood pressure (!) 162/98, pulse (!) 117, temperature 98 F (36.7 C), temperature source Axillary, resp. rate 12, height 5\' 3"  (1.6 m), weight 58.1 kg, SpO2 99%.   Exam:  Patient in bed awake but confused, some distress, likely abdominal discomfort but cannot express herself [place].AT,PERRAL Supple Neck, No JVD,   Symmetrical Chest wall movement, Good air movement bilaterally, CTAB RRR,No Gallops, Rubs or new Murmurs,  +ve B.Sounds, Abd mildly distended in the suprapubic area No Cyanosis, Clubbing or edema    Assessment/Plan:  On 10/26/2022 patient was seen by palliative care team along with hospitalist treatment team, long discussions with made with patient's family he is now transition to full comfort measures, looking for residential hospice, goal of care is now comfort directed. All known comfort medications stopped, other medical problems addressed earlier this admission are below.    Severe sepsis due to E. coli bacteremia in the setting of gastroenteritis due to Shigella producing E. Coli Although sepsis physiology overall better-she has been febrile for the past 3 days  Concern for aspiration-on Rocephin-Flagyl was added yesterday  Although blood cultures ordered-has not been done for the past several days-I have discussed with nurse/charge nurse Very poor overall prognosis-family meeting again at 11 AM today to continue goals of care discussion.  Appropriate to transition to full comfort measures.    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    AKI Suspicion for ATN in the setting  of sepsis versus HUS syndrome Nonoliguric Nephrology following-however  not a candidate for HD Avoid nephrotoxic agents and follow  Thrombocytopenia Either from HUS syndrome physiology or due to severe sepsis-in a background of chronic thrombocytopenia from EtOH use. Improving Follow CBC  Acute metabolic encephalopathy Multifactorial-from sepsis/AKI/alcohol withdrawal (should be out of window by now) Essentially unchanged for the past several days-confused-follows simple commands. In restraints-but seems to be moving all 4 extremities Supportive care Delirium precautions  Oropharyngeal dysphagia In the setting of encephalopathy/debility/deconditioning NG tube feedings ongoing Goals of care discussions ongoing  Normocytic anemia Secondary to acute illness/AKI No evidence of GI bleeding Hemoglobin back down to 6.5-will require another unit of PRBC (last transfusion on 9/19) Follow CBC.  EtOH use Should be out of the window for any withdrawal symptoms No longer on benzos Supportive care-MVI/thiamine/folate  HTN BP stable All antihypertensives on hold  RUE  edema/pain Imaging/Dopplers negative Supportive care  Palliative care/goals of care DNR No further escalation in care-not a dialysis candidate  Although some improvement in platelet count for the past several days-now spiking fevers-remains very encephalopathic-no oral intake-continues to have significant acute kidney injury. Family meeting yesterday-family still wanting to wait and see-another family meeting scheduled for 11 AM today.  Nutrition Status: Nutrition Problem: Severe Malnutrition Etiology: chronic illness (chronic pancreatitis, liver disease) Signs/Symptoms: severe fat depletion, severe muscle depletion Interventions: Tube feeding, MVI, Refer to RD note for recommendations  Underweight: Estimated body mass index is 22.69 kg/m as calculated from the following:   Height as of this encounter: 5\' 3"  (1.6 m).   Weight as of this encounter: 58.1 kg.   Code status:   Code  Status: Do not attempt resuscitation (DNR) - Comfort care   DVT Prophylaxis:    Family Communication: Brother Shelton-[phone] updated over the phone on 10/27/2022, confirmed DNR and full comfort measures.   Disposition Plan: Status is: Inpatient Remains inpatient appropriate because: Severity of illness   Planned Discharge Destination:Skilled nursing facility versus  hospice   Diet: Diet Order             Diet regular Room service appropriate? Yes; Fluid consistency: Thin  Diet effective now                     MEDICATIONS: Scheduled Meds:  antiseptic oral rinse  15 mL Topical BID   bisacodyl  10 mg Rectal Once   mouth rinse  15 mL Mouth Rinse 4 times per day   sodium chloride flush  10-40 mL Intracatheter Q12H   Continuous Infusions:  sodium chloride Stopped (10/23/22 1249)   PRN Meds:.sodium chloride, acetaminophen **OR** acetaminophen, diphenhydrAMINE, glycopyrrolate **OR** glycopyrrolate **OR** glycopyrrolate, guaiFENesin, haloperidol **OR** haloperidol **OR** haloperidol lactate, HYDROmorphone (DILAUDID) injection, LORazepam **OR** LORazepam **OR** LORazepam, ondansetron **OR** ondansetron (ZOFRAN) IV, mouth rinse, polyvinyl alcohol, sodium chloride flush, white petrolatum   I have personally reviewed following labs and imaging studies  LABORATORY DATA: CBC: Recent Labs  Lab 10/22/22 0304 10/23/22 0400 10/24/22 0354 10/25/22 0408 10/26/22 0341  WBC 26.8* 25.5* 25.5* 26.3* 22.0*  NEUTROABS 21.9* 20.6* 20.4* 20.0* 19.4*  HGB 9.8* 7.5* 6.3* 7.4* 6.5*  HCT 28.4* 21.9* 18.5* 21.6* 19.5*  MCV 87.9 92.8 93.4 86.7 89.9  PLT 24* 30* 48* 64* 74*    Basic Metabolic Panel: Recent Labs  Lab 10/20/22 1735 10/21/22 0326 10/22/22 0304 10/23/22 0400 10/26/22 0341  NA  --  142 143 145 140  K  --  4.7 4.4 4.9 4.7  CL  --  106 103 106 112*  CO2  --  26 28 27  21*  GLUCOSE  --  118* 112* 110* 107*  BUN  --  77* 84* 90* 89*  CREATININE  --  3.62* 3.70*  3.78* 3.79*  CALCIUM  --  8.4* 8.7* 8.5* 7.8*  MG 1.6* 1.6* 2.7* 2.4  --   PHOS 2.5  --   --  3.2 5.6*    GFR: Estimated Creatinine Clearance: 12.9 mL/min (A) (by C-G formula based on SCr of 3.79 mg/dL (H)).  Liver Function Tests: Recent Labs  Lab 10/21/22 0326 10/22/22 0304 10/23/22 0400 10/26/22 0341  AST 46* 41 40  --   ALT 21 20 20   --   ALKPHOS 180* 188* 163*  --   BILITOT 6.5* 6.7* 5.5*  --   PROT 5.3* 6.0* 5.5*  --   ALBUMIN 1.8* 1.9* 1.6* <1.5*   No results for input(s): "LIPASE", "AMYLASE" in the last 168 hours. No results for input(s): "AMMONIA" in the last 168 hours.  Coagulation Profile: No results for input(s): "INR", "PROTIME" in the last 168 hours.   Cardiac Enzymes: No results for input(s): "CKTOTAL", "CKMB", "CKMBINDEX", "TROPONINI" in the last 168 hours.  BNP (last 3 results) No results for input(s): "PROBNP" in the last 8760 hours.  Lipid Profile: No results for input(s): "CHOL", "HDL", "LDLCALC", "TRIG", "CHOLHDL", "LDLDIRECT" in the last 72 hours.  Thyroid Function Tests: No results for input(s): "TSH", "T4TOTAL", "FREET4", "T3FREE", "THYROIDAB" in the last 72 hours.  Anemia Panel: No results for input(s): "VITAMINB12", "FOLATE", "FERRITIN", "TIBC", "IRON", "RETICCTPCT" in the last 72 hours.  Urine analysis:    Component Value Date/Time   COLORURINE AMBER (A) 10/14/2022 1600   APPEARANCEUR TURBID (A) 10/14/2022 1600   LABSPEC 1.017 10/14/2022 1600   PHURINE 5.0 10/14/2022 1600   GLUCOSEU NEGATIVE 10/14/2022 1600   HGBUR SMALL (A) 10/14/2022 1600   BILIRUBINUR SMALL (A) 10/14/2022 1600   KETONESUR NEGATIVE 10/14/2022 1600   PROTEINUR 100 (A) 10/14/2022 1600   UROBILINOGEN 0.2 04/14/2014 1419   NITRITE NEGATIVE 10/14/2022 1600   LEUKOCYTESUR MODERATE (A) 10/14/2022 1600    Sepsis Labs: Lactic Acid, Venous    Component Value Date/Time   LATICACIDVEN 1.2 10/17/2022 2143    MICROBIOLOGY: Recent Results (from the past 240 hour(s))   MRSA Next Gen by PCR, Nasal     Status: None   Collection Time: 10/17/22  9:30 PM   Specimen: Nasal Mucosa; Nasal Swab  Result Value Ref Range Status   MRSA by PCR Next Gen NOT DETECTED NOT DETECTED Final    Comment: (NOTE) The GeneXpert MRSA Assay (FDA approved for NASAL specimens only), is one component of a comprehensive MRSA colonization surveillance program. It is not intended to diagnose MRSA infection nor to guide or monitor treatment for MRSA infections. Test performance is not FDA approved in patients less than 62 years old. Performed at [facility], [address], [place], [place] [address]   Culture, blood (Routine X 2) w Reflex to ID Panel     Status: None   Collection Time: 10/20/22  2:36 PM   Specimen: BLOOD LEFT HAND  Result Value Ref Range Status   Specimen Description BLOOD LEFT HAND  Final   Special Requests   Final    BOTTLES DRAWN AEROBIC AND ANAEROBIC Blood Culture results may not be optimal due to an inadequate volume of blood received in culture bottles   Culture   Final    NO GROWTH 5  DAYS Performed at [facility], [address], [place], [place] [address]    Report Status 10/25/2022 FINAL  Final  Culture, blood (Routine X 2) w Reflex to ID Panel     Status: None   Collection Time: 10/20/22  2:36 PM   Specimen: BLOOD LEFT HAND  Result Value Ref Range Status   Specimen Description BLOOD LEFT HAND  Final   Special Requests   Final    BOTTLES DRAWN AEROBIC AND ANAEROBIC Blood Culture results may not be optimal due to an inadequate volume of blood received in culture bottles   Culture   Final    NO GROWTH 5 DAYS Performed at [facility], [address], [place], [place] [address]    Report Status 10/25/2022 FINAL  Final  Culture, blood (Routine X 2) w Reflex to ID Panel     Status: None (Preliminary result)   Collection Time: 10/26/22 10:47 AM   Specimen: BLOOD RIGHT HAND  Result Value Ref Range Status   Specimen Description BLOOD  RIGHT HAND  Final   Special Requests   Final    BOTTLES DRAWN AEROBIC AND ANAEROBIC Blood Culture adequate volume   Culture   Final    NO GROWTH < 24 HOURS Performed at [facility], [address] [place], [place] [address]    Report Status PENDING  Incomplete  Culture, blood (Routine X 2) w Reflex to ID Panel     Status: None (Preliminary result)   Collection Time: 10/26/22 10:47 AM   Specimen: BLOOD LEFT HAND  Result Value Ref Range Status   Specimen Description BLOOD LEFT HAND  Final   Special Requests   Final    BOTTLES DRAWN AEROBIC AND ANAEROBIC Blood Culture adequate volume   Culture   Final    NO GROWTH < 24 HOURS Performed at [facility], [address], [place], [place] [address]    Report Status PENDING  Incomplete    RADIOLOGY STUDIES/RESULTS: DG Abd Portable 1V  Result Date: 10/27/2022 CLINICAL DATA:  Constipation.  Generalized body aches.  Sepsis. EXAM: PORTABLE ABDOMEN - 1 VIEW COMPARISON:  10/18/2022 FINDINGS: Bowel gas pattern is notable for several air-filled nondilated small bowel loops within the central to left abdomen. No significant air-fluid levels. No free peritoneal air. Minimal residual contrast material within the colon. Mild to moderate stool/contrast over the rectum. Remainder of the exam is unchanged. IMPRESSION: Nonspecific bowel gas pattern with several air-filled nondilated small bowel loops within the central to left abdomen. Electronically Signed   By: Elberta Fortis M.D.   On: 10/27/2022 08:54     LOS: 13 days   Signature  -    Susa Raring M.D on 10/27/2022 at 10:34 AM   -  To page go to [email or web address]

## 2022-10-27 NOTE — Progress Notes (Addendum)
[facility] Liaison Note  Referral received for patient/family interest in [facility]. Chart under review by [facility] physician.   Bed offered and accepted for transfer today to [facility]. Unit RN please call report to [phone] prior to patient leaving the unit. Please send signed DNR and paperwork with patient.   Please leave all IV access and ports in place.   Please call with any questions or concerns. Thank you  Dionicio Stall, LCSW [facility] hospital liaison [phone]

## 2022-10-27 NOTE — Discharge Instructions (Signed)
Disposition.  Residential hospice Condition.  Guarded CODE STATUS.  DNR Activity.  With assistance as tolerated, full fall precautions. Diet.  Soft with feeding assistance and aspiration precautions. Goal of care.  Comfort.

## 2022-10-27 NOTE — Progress Notes (Addendum)
Daily Progress Note   Patient Name: Brandy Bishop       Date: 10/27/2022 DOB: 1960-06-10  Age: 62 y.o. MRN#: [id number] Attending Physician: Leroy Sea, MD Primary Care Physician: Cline Crock, NP Admit Date: 10/14/2022  Reason for Consultation/Follow-up: Establishing goals of care  Length of Stay: 13  Current Medications: Scheduled Meds:   antiseptic oral rinse  15 mL Topical BID   mouth rinse  15 mL Mouth Rinse 4 times per day   sodium chloride flush  10-40 mL Intracatheter Q12H    Continuous Infusions:  sodium chloride Stopped (10/23/22 1249)    PRN Meds: sodium chloride, acetaminophen **OR** acetaminophen, diphenhydrAMINE, glycopyrrolate **OR** glycopyrrolate **OR** glycopyrrolate, guaiFENesin, haloperidol **OR** haloperidol **OR** haloperidol lactate, HYDROmorphone (DILAUDID) injection, LORazepam **OR** LORazepam **OR** LORazepam, ondansetron **OR** ondansetron (ZOFRAN) IV, mouth rinse, polyvinyl alcohol, sodium chloride flush, white petrolatum  Physical Exam Vitals reviewed.  Constitutional:      General: She is awake.     Appearance: She is ill-appearing.  HENT:     Head: Normocephalic and atraumatic.  Pulmonary:     Effort: Pulmonary effort is normal.  Abdominal:     General: There is distension.  Skin:    General: Skin is warm and dry.  Psychiatric:        Behavior: Behavior is agitated.             Vital Signs: BP (!) 162/98 (BP Location: Right Arm)   Pulse (!) 117   Temp 98 F (36.7 C) (Axillary)   Resp 12   Ht 5\' 3"  (1.6 m)   Wt 58.1 kg   SpO2 99%   BMI 22.69 kg/m  SpO2: SpO2: 99 % O2 Device: O2 Device: Room Air O2 Flow Rate: O2 Flow Rate (L/min): 2 L/min    Patient Active Problem List   Diagnosis Date Noted   Sepsis due to Escherichia coli with encephalopathy without septic shock (HCC)  10/17/2022   Acute metabolic encephalopathy 10/17/2022   Severe sepsis (HCC) 10/14/2022   Hypovolemia 10/14/2022   Dehydration 10/14/2022   AKI (acute kidney injury) (HCC) 10/14/2022   CAP (community acquired pneumonia) 06/30/2022   Colon cancer screening 08/10/2020   Acute liver failure 06/08/2020   Acute GI bleeding 09/08/2019   Polysubstance abuse (HCC) 09/08/2019   Gastroesophageal reflux disease with esophagitis    Seizure (HCC) 05/30/2015   Cough    Subtherapeutic phenytoin level    Acute upper GI bleed    Hypomagnesemia 04/16/2014   Hypokalemia 04/16/2014   Thrombocytopenia (HCC) 04/16/2014   Liver disease 04/15/2014   ETOH abuse 04/15/2014   Chronic pancreatitis (HCC) 04/15/2014   Protein-calorie malnutrition, severe (HCC) 04/15/2014   Severe protein-calorie malnutrition (HCC) 04/15/2014   Bleeding gastrointestinal    Abnormal CT scan, colon    Rectal bleeding    Abdominal pain    Alcoholic hepatitis without ascites    GI bleed 04/14/2014   Viral URI with cough 05/07/2012   Pyelonephritis 05/07/2012   Seizure disorder (HCC) 05/07/2012   Asthma with bronchitis 05/07/2012   HTN (hypertension) 05/07/2012    Palliative Care Assessment & Plan   Patient Profile: 62 y.o. female  with past medical history of ETOH use,  asthma, chronic back pain, colitis, CIB, pancreatitis, and seizures admitted on 10/14/2022 with poor appetite and weakness. Patient found to have E coli bacteremia; STEC gastroenteritis. Severe AKI, no need for HD at this point. Has received some benzos during hospitalizations for withdrawal symptoms. PMT consulted for GOC discussions.    Discussion: Patient was awake in bed. She appears agitated--notified [employer] [facility] for prn medications. Family at bedside talking to patient. Answered questions about her poor prognosis and measures to keep her comfortable.  10:05: Spoke to brother Renae Gloss. Gave update on patient and continued plan for comfort measures. Family would  like the patient transferred to [facility] if possible. TOC order placed. Encouraged family to call with any questions or concerns.  13:00: Went to speak to brother Renae Gloss and family at bedside. Answered questions about symptom management and residential hospice. Renae Gloss received a call from [facility]. [facility] is to transfer there today.  Recommendations/Plan: Full comfort measures Symptom management below Patient's brother Thersa Salt [phone] will remain the point of contact for the family. TOC order placed for residential hospice [facility]) Continued PMT support  Symptom Management: Dilaudid PRN for pain/air hunger/comfort Robinul PRN for excessive secretions Ativan PRN for agitation/anxiety Zofran PRN for nausea Liquifilm tears PRN for dry eyes Haldol PRN for agitation/anxiety May have comfort feeding Comfort tray for family Unrestricted visitations in the setting of EOL (per policy) Oxygen PRN 2L or less for comfort. No escalation.    Care plan was discussed with bedside RN   Time spent: 25 minutes plus additional 30 minutes   Thank you for allowing the Palliative Medicine Team to assist in the care of this patient.     Sherryll Burger, NP  Please contact Palliative Medicine Team phone at [phone] for questions and concerns.

## 2022-10-31 LAB — CULTURE, BLOOD (ROUTINE X 2)
Culture: NO GROWTH
Culture: NO GROWTH
Special Requests: ADEQUATE
Special Requests: ADEQUATE

## 2022-11-05 DEATH — deceased

## 2023-11-20 LAB — MISCELLANEOUS TEST
